# Patient Record
Sex: Female | Born: 1935 | Race: White | Hispanic: No | Marital: Married | Smoking: Former smoker
Health system: Southern US, Community
[De-identification: ages and names within clinical notes are randomized; demographics above are authoritative.]

## PROBLEM LIST (undated history)

## (undated) DIAGNOSIS — I10 Essential (primary) hypertension: Secondary | ICD-10-CM

## (undated) DIAGNOSIS — H9190 Unspecified hearing loss, unspecified ear: Secondary | ICD-10-CM

## (undated) DIAGNOSIS — M75101 Unspecified rotator cuff tear or rupture of right shoulder, not specified as traumatic: Secondary | ICD-10-CM

## (undated) DIAGNOSIS — S82899A Other fracture of unspecified lower leg, initial encounter for closed fracture: Secondary | ICD-10-CM

## (undated) DIAGNOSIS — T8859XA Other complications of anesthesia, initial encounter: Secondary | ICD-10-CM

## (undated) DIAGNOSIS — C50919 Malignant neoplasm of unspecified site of unspecified female breast: Secondary | ICD-10-CM

## (undated) DIAGNOSIS — B029 Zoster without complications: Secondary | ICD-10-CM

## (undated) DIAGNOSIS — D126 Benign neoplasm of colon, unspecified: Secondary | ICD-10-CM

## (undated) DIAGNOSIS — T4145XA Adverse effect of unspecified anesthetic, initial encounter: Secondary | ICD-10-CM

## (undated) DIAGNOSIS — R112 Nausea with vomiting, unspecified: Secondary | ICD-10-CM

## (undated) DIAGNOSIS — M1990 Unspecified osteoarthritis, unspecified site: Secondary | ICD-10-CM

## (undated) DIAGNOSIS — T148XXA Other injury of unspecified body region, initial encounter: Secondary | ICD-10-CM

## (undated) DIAGNOSIS — S62109A Fracture of unspecified carpal bone, unspecified wrist, initial encounter for closed fracture: Secondary | ICD-10-CM

## (undated) DIAGNOSIS — E785 Hyperlipidemia, unspecified: Secondary | ICD-10-CM

## (undated) DIAGNOSIS — Z9889 Other specified postprocedural states: Secondary | ICD-10-CM

## (undated) DIAGNOSIS — N3010 Interstitial cystitis (chronic) without hematuria: Secondary | ICD-10-CM

## (undated) HISTORY — PX: TONSILLECTOMY: SHX5217

## (undated) HISTORY — PX: WISDOM TOOTH EXTRACTION: SHX21

## (undated) HISTORY — DX: Fracture of unspecified carpal bone, unspecified wrist, initial encounter for closed fracture: S62.109A

## (undated) HISTORY — DX: Interstitial cystitis (chronic) without hematuria: N30.10

## (undated) HISTORY — DX: Hyperlipidemia, unspecified: E78.5

## (undated) HISTORY — DX: Malignant neoplasm of unspecified site of unspecified female breast: C50.919

## (undated) HISTORY — PX: DILATION AND CURETTAGE OF UTERUS: SHX78

## (undated) HISTORY — DX: Other fracture of unspecified lower leg, initial encounter for closed fracture: S82.899A

## (undated) HISTORY — DX: Benign neoplasm of colon, unspecified: D12.6

## (undated) HISTORY — PX: COLONOSCOPY: SHX174

## (undated) HISTORY — DX: Unspecified osteoarthritis, unspecified site: M19.90

## (undated) HISTORY — PX: MASTECTOMY: SHX3

## (undated) HISTORY — DX: Other injury of unspecified body region, initial encounter: T14.8XXA

## (undated) HISTORY — DX: Essential (primary) hypertension: I10

---

## 1973-08-03 HISTORY — PX: TUBAL LIGATION: SHX77

## 1976-08-03 DIAGNOSIS — C50919 Malignant neoplasm of unspecified site of unspecified female breast: Secondary | ICD-10-CM

## 1976-08-03 HISTORY — PX: MASTECTOMY, PARTIAL: SHX709

## 1976-08-03 HISTORY — DX: Malignant neoplasm of unspecified site of unspecified female breast: C50.919

## 1986-08-03 HISTORY — PX: ABDOMINAL HYSTERECTOMY: SHX81

## 2006-11-29 ENCOUNTER — Ambulatory Visit: Payer: Self-pay | Admitting: Gastroenterology

## 2006-11-30 ENCOUNTER — Ambulatory Visit: Payer: Self-pay | Admitting: Gastroenterology

## 2006-11-30 ENCOUNTER — Encounter: Payer: Self-pay | Admitting: Gastroenterology

## 2006-12-07 ENCOUNTER — Ambulatory Visit: Payer: Self-pay | Admitting: Internal Medicine

## 2006-12-07 LAB — CONVERTED CEMR LAB
AST: 24 units/L (ref 0–37)
Bilirubin, Direct: 0.1 mg/dL (ref 0.0–0.3)
CO2: 30 meq/L (ref 19–32)
Chloride: 105 meq/L (ref 96–112)
Cholesterol: 275 mg/dL (ref 0–200)
Direct LDL: 165 mg/dL
Glucose, Bld: 102 mg/dL — ABNORMAL HIGH (ref 70–99)
HDL: 35.5 mg/dL — ABNORMAL LOW (ref >39.0)
Sodium: 143 meq/L (ref 135–145)
Total Protein: 7.6 g/dL (ref 6.0–8.3)

## 2006-12-14 ENCOUNTER — Encounter: Payer: Self-pay | Admitting: Internal Medicine

## 2006-12-14 DIAGNOSIS — N3010 Interstitial cystitis (chronic) without hematuria: Secondary | ICD-10-CM

## 2006-12-14 DIAGNOSIS — S62109A Fracture of unspecified carpal bone, unspecified wrist, initial encounter for closed fracture: Secondary | ICD-10-CM | POA: Insufficient documentation

## 2006-12-14 DIAGNOSIS — S82899A Other fracture of unspecified lower leg, initial encounter for closed fracture: Secondary | ICD-10-CM | POA: Insufficient documentation

## 2006-12-14 DIAGNOSIS — M1990 Unspecified osteoarthritis, unspecified site: Secondary | ICD-10-CM | POA: Insufficient documentation

## 2006-12-14 DIAGNOSIS — Z853 Personal history of malignant neoplasm of breast: Secondary | ICD-10-CM | POA: Insufficient documentation

## 2006-12-14 DIAGNOSIS — Z9189 Other specified personal risk factors, not elsewhere classified: Secondary | ICD-10-CM | POA: Insufficient documentation

## 2006-12-14 DIAGNOSIS — R32 Unspecified urinary incontinence: Secondary | ICD-10-CM

## 2006-12-14 HISTORY — DX: Other fracture of unspecified lower leg, initial encounter for closed fracture: S82.899A

## 2006-12-14 HISTORY — DX: Unspecified urinary incontinence: R32

## 2006-12-14 HISTORY — DX: Fracture of unspecified carpal bone, unspecified wrist, initial encounter for closed fracture: S62.109A

## 2006-12-14 HISTORY — DX: Interstitial cystitis (chronic) without hematuria: N30.10

## 2006-12-14 HISTORY — DX: Personal history of malignant neoplasm of breast: Z85.3

## 2006-12-15 ENCOUNTER — Ambulatory Visit: Payer: Self-pay | Admitting: Internal Medicine

## 2007-02-02 ENCOUNTER — Ambulatory Visit: Payer: Self-pay | Admitting: Internal Medicine

## 2007-02-02 LAB — CONVERTED CEMR LAB
ALT: 25 U/L
AST: 27 U/L
Albumin: 4.2 g/dL
Alkaline Phosphatase: 69 U/L
BUN: 16 mg/dL
Bilirubin, Direct: 0.1 mg/dL
CO2: 29 meq/L
Calcium: 9.2 mg/dL
Chloride: 110 meq/L
Creatinine, Ser: 0.7 mg/dL
GFR calc Af Amer: 105 mL/min
GFR calc non Af Amer: 87 mL/min
Glucose, Bld: 108 mg/dL — ABNORMAL HIGH
Potassium: 4.1 meq/L
Sodium: 143 meq/L
Total Bilirubin: 1.1 mg/dL
Total Protein: 7.7 g/dL

## 2007-02-08 ENCOUNTER — Ambulatory Visit: Payer: Self-pay | Admitting: Internal Medicine

## 2007-02-13 DIAGNOSIS — I10 Essential (primary) hypertension: Secondary | ICD-10-CM

## 2007-02-13 DIAGNOSIS — E785 Hyperlipidemia, unspecified: Secondary | ICD-10-CM | POA: Insufficient documentation

## 2007-02-13 HISTORY — DX: Essential (primary) hypertension: I10

## 2007-03-29 ENCOUNTER — Ambulatory Visit: Payer: Self-pay | Admitting: Internal Medicine

## 2007-03-29 LAB — CONVERTED CEMR LAB
Cholesterol: 150 mg/dL (ref 0–200)
HDL: 30.8 mg/dL — ABNORMAL LOW (ref >39.0)
LDL Cholesterol: 97 mg/dL (ref 0–99)
Vit D, 1,25-Dihydroxy: 35 (ref 20–57)

## 2007-04-05 ENCOUNTER — Ambulatory Visit: Payer: Self-pay | Admitting: Internal Medicine

## 2007-04-05 DIAGNOSIS — R7309 Other abnormal glucose: Secondary | ICD-10-CM

## 2007-04-05 HISTORY — DX: Other abnormal glucose: R73.09

## 2007-04-05 LAB — CONVERTED CEMR LAB: Blood Glucose, Fingerstick: 128

## 2007-07-06 ENCOUNTER — Ambulatory Visit: Payer: Self-pay | Admitting: Internal Medicine

## 2007-07-06 LAB — CONVERTED CEMR LAB
Blood Glucose, Fingerstick: 124
Cholesterol, target level: 200 mg/dL
Vit D, 1,25-Dihydroxy: 53 (ref 30–89)

## 2007-11-14 ENCOUNTER — Ambulatory Visit: Payer: Self-pay | Admitting: Internal Medicine

## 2008-02-21 ENCOUNTER — Ambulatory Visit: Payer: Self-pay | Admitting: Internal Medicine

## 2008-02-21 LAB — CONVERTED CEMR LAB
ALT: 31 units/L (ref 0–35)
AST: 27 units/L (ref 0–37)
LDL Cholesterol: 101 mg/dL — ABNORMAL HIGH (ref 0–99)
Total Bilirubin: 1.1 mg/dL (ref 0.3–1.2)
Total CHOL/HDL Ratio: 3.9
Total Protein: 7.3 g/dL (ref 6.0–8.3)

## 2008-02-28 ENCOUNTER — Ambulatory Visit: Payer: Self-pay | Admitting: Internal Medicine

## 2008-02-28 DIAGNOSIS — R634 Abnormal weight loss: Secondary | ICD-10-CM

## 2008-03-01 ENCOUNTER — Telehealth: Payer: Self-pay | Admitting: *Deleted

## 2008-03-02 LAB — CONVERTED CEMR LAB
Basophils Absolute: 0 10*3/uL (ref 0.0–0.1)
Eosinophils Absolute: 0.1 10*3/uL (ref 0.0–0.7)
Free T4: 0.8 ng/dL (ref 0.6–1.6)
Hemoglobin: 13.9 g/dL (ref 12.0–15.0)
Lymphocytes Relative: 24.3 % (ref 12.0–46.0)
MCHC: 34.4 g/dL (ref 30.0–36.0)
Monocytes Absolute: 0.4 10*3/uL (ref 0.1–1.0)
RDW: 11.4 % — ABNORMAL LOW (ref 11.5–14.6)
Sed Rate: 18 mm/hr (ref 0–22)
WBC: 5.2 10*3/uL (ref 4.5–10.5)

## 2008-03-22 ENCOUNTER — Encounter: Admission: RE | Admit: 2008-03-22 | Discharge: 2008-03-22 | Payer: Self-pay | Admitting: Internal Medicine

## 2008-04-02 ENCOUNTER — Telehealth: Payer: Self-pay | Admitting: Internal Medicine

## 2008-05-10 ENCOUNTER — Ambulatory Visit: Payer: Self-pay | Admitting: Internal Medicine

## 2008-05-14 ENCOUNTER — Telehealth: Payer: Self-pay | Admitting: Internal Medicine

## 2008-07-31 ENCOUNTER — Telehealth: Payer: Self-pay | Admitting: Internal Medicine

## 2008-11-01 ENCOUNTER — Ambulatory Visit: Payer: Self-pay | Admitting: Internal Medicine

## 2008-12-18 ENCOUNTER — Ambulatory Visit: Payer: Self-pay | Admitting: Internal Medicine

## 2008-12-18 ENCOUNTER — Encounter: Payer: Self-pay | Admitting: Internal Medicine

## 2009-03-05 ENCOUNTER — Telehealth: Payer: Self-pay | Admitting: *Deleted

## 2009-04-03 ENCOUNTER — Ambulatory Visit: Payer: Self-pay | Admitting: Internal Medicine

## 2009-05-01 ENCOUNTER — Ambulatory Visit: Payer: Self-pay | Admitting: Internal Medicine

## 2009-07-01 ENCOUNTER — Ambulatory Visit: Payer: Self-pay | Admitting: Gastroenterology

## 2009-07-01 ENCOUNTER — Encounter (INDEPENDENT_AMBULATORY_CARE_PROVIDER_SITE_OTHER): Payer: Self-pay | Admitting: *Deleted

## 2009-07-01 DIAGNOSIS — R109 Unspecified abdominal pain: Secondary | ICD-10-CM | POA: Insufficient documentation

## 2009-07-01 DIAGNOSIS — Z860100 Personal history of colon polyps, unspecified: Secondary | ICD-10-CM

## 2009-07-01 DIAGNOSIS — K922 Gastrointestinal hemorrhage, unspecified: Secondary | ICD-10-CM

## 2009-07-01 DIAGNOSIS — K5900 Constipation, unspecified: Secondary | ICD-10-CM

## 2009-07-01 DIAGNOSIS — K921 Melena: Secondary | ICD-10-CM | POA: Insufficient documentation

## 2009-07-01 DIAGNOSIS — R159 Full incontinence of feces: Secondary | ICD-10-CM

## 2009-07-01 HISTORY — DX: Personal history of colon polyps, unspecified: Z86.0100

## 2009-07-01 HISTORY — DX: Gastrointestinal hemorrhage, unspecified: K92.2

## 2009-07-01 HISTORY — DX: Melena: K92.1

## 2009-07-01 HISTORY — DX: Constipation, unspecified: K59.00

## 2009-07-01 HISTORY — DX: Full incontinence of feces: R15.9

## 2009-07-10 ENCOUNTER — Telehealth: Payer: Self-pay | Admitting: Gastroenterology

## 2009-07-11 ENCOUNTER — Ambulatory Visit: Payer: Self-pay | Admitting: Gastroenterology

## 2009-07-15 ENCOUNTER — Encounter: Payer: Self-pay | Admitting: Gastroenterology

## 2009-08-12 ENCOUNTER — Ambulatory Visit: Payer: Self-pay | Admitting: Internal Medicine

## 2009-08-12 ENCOUNTER — Telehealth: Payer: Self-pay | Admitting: Internal Medicine

## 2009-10-01 ENCOUNTER — Ambulatory Visit: Payer: Self-pay | Admitting: Internal Medicine

## 2009-10-01 LAB — CONVERTED CEMR LAB
ALT: 27 units/L (ref 0–35)
AST: 28 units/L (ref 0–37)
BUN: 17 mg/dL (ref 6–23)
Basophils Relative: 0.2 % (ref 0.0–3.0)
Bilirubin, Direct: 0.2 mg/dL (ref 0.0–0.3)
CO2: 29 meq/L (ref 19–32)
Eosinophils Relative: 2 % (ref 0.0–5.0)
HCT: 40.6 % (ref 36.0–46.0)
Lymphs Abs: 1.3 10*3/uL (ref 0.7–4.0)
MCHC: 33.6 g/dL (ref 30.0–36.0)
MCV: 96.3 fL (ref 78.0–100.0)
Monocytes Absolute: 0.4 10*3/uL (ref 0.1–1.0)
Monocytes Relative: 9.5 % (ref 3.0–12.0)
Neutro Abs: 2.7 10*3/uL (ref 1.4–7.7)
Neutrophils Relative %: 59.4 % (ref 43.0–77.0)
Platelets: 132 10*3/uL — ABNORMAL LOW (ref 150.0–400.0)
Potassium: 4 meq/L (ref 3.5–5.1)
RBC: 4.22 M/uL (ref 3.87–5.11)
RDW: 11.7 % (ref 11.5–14.6)
Sodium: 141 meq/L (ref 135–145)
TSH: 1.47 microintl units/mL (ref 0.35–5.50)
Total Bilirubin: 0.7 mg/dL (ref 0.3–1.2)
Total Protein: 7.2 g/dL (ref 6.0–8.3)
WBC: 4.5 10*3/uL (ref 4.5–10.5)

## 2009-10-08 ENCOUNTER — Ambulatory Visit: Payer: Self-pay | Admitting: Internal Medicine

## 2010-02-28 ENCOUNTER — Encounter: Admission: RE | Admit: 2010-02-28 | Discharge: 2010-02-28 | Payer: Self-pay | Admitting: Internal Medicine

## 2010-04-15 ENCOUNTER — Ambulatory Visit: Payer: Self-pay | Admitting: Internal Medicine

## 2010-08-14 ENCOUNTER — Ambulatory Visit
Admission: RE | Admit: 2010-08-14 | Discharge: 2010-08-14 | Payer: Self-pay | Source: Home / Self Care | Attending: Internal Medicine | Admitting: Internal Medicine

## 2010-08-31 LAB — CONVERTED CEMR LAB
ALT: 32 units/L (ref 0–35)
AST: 27 units/L (ref 0–37)
Alkaline Phosphatase: 64 units/L (ref 39–117)
BUN: 21 mg/dL (ref 6–23)
Bilirubin, Direct: 0 mg/dL (ref 0.0–0.3)
Cholesterol: 148 mg/dL (ref 0–200)
Creatinine, Ser: 0.7 mg/dL (ref 0.4–1.2)
Hgb A1c MFr Bld: 5.9 % (ref 4.6–6.5)
TSH: 1.74 microintl units/mL (ref 0.35–5.50)
Total Protein: 7.7 g/dL (ref 6.0–8.3)

## 2010-09-02 NOTE — Assessment & Plan Note (Signed)
Summary: 6 MONTH ROV/NJR   Vital Signs:  Patient profile:   77 year old female Menstrual status:  hysterectomy Height:      63.5 inches Weight:      120 pounds BMI:     21.00 Pulse rate:   78 / minute BP sitting:   120 / 80  (right arm) Cuff size:   regular  Vitals Entered By: Romualdo Bolk, CMA (AAMA) (April 15, 2010 10:27 AM) CC: follow-up visit, Hypertension Management Nutritional Status Detail bg 2 hours after eating CBG Result 120   History of Present Illness: [name] [name] comes in today   for follow up of multiple medical problems  Since last visit  here  there have been no major changes in health status  but she has  lost weight from changing her diet and cutting out sugars   .  Anxiety  about     home situation. Still difficult with her husband   No cp sob /   NO change in exercise tolerance.  hard stools.  NO blood.  LIPDS: no se of meds noted  HT seems controlled    Hypertension History:      She complains of dyspnea with exertion, but denies headache, chest pain, palpitations, orthopnea, PND, peripheral edema, visual symptoms, neurologic problems, syncope, and side effects from treatment.  She notes no problems with any antihypertensive medication side effects.        Positive major cardiovascular risk factors include female age 64 years old or older, hyperlipidemia, and hypertension.  Negative major cardiovascular risk factors include non-tobacco-user status.     Preventive Screening-Counseling & Management  Alcohol-Tobacco     Alcohol drinks/day: 0     Smoking Status: quit     Year Quit: age 89  Caffeine-Diet-Exercise     Caffeine use/day: 3     Does Patient Exercise: yes     Type of exercise: Walk     Exercise (avg: min/session): <30     Times/week: 2  Current Medications (verified): 1)  Calcium 500 500 Mg Tabs (Calcium Carbonate) .... Take As Directed. 2)  Fish Oil 500 Mg Caps (Omega-3 Fatty Acids) .... Take 2 Capsule By Mouth Twice A  Day 3)  Oxybutynin Chloride 5 Mg Tabs (Oxybutynin Chloride) .Marland Kitchen.. 1 By Mouth Once Daily 4)  Norvasc 5 Mg  Tabs (Amlodipine Besylate) .Marland Kitchen.. 1 By Mouth Once Daily 5)  Simvastatin 20 Mg  Tabs (Simvastatin) .Marland Kitchen.. 1 By Mouth Once Daily 6)  Centrum Silver   Tabs (Multiple Vitamins-Minerals) .Marland Kitchen.. 1 By Mouth Once Daily 7)  Bl Vitamin C 500 Mg  Tabs (Ascorbic Acid) 8)  Vitamin D 400 Unit Tabs (Cholecalciferol) 9)  Magnesium 300 Mg Caps (Magnesium)  Allergies (verified): 1)  ! Macrobid 2)  ! * Furadantin 3)  Sulfamethoxazole (Sulfamethoxazole)  Past History:  Past medical, surgical, family and social histories (including risk factors) reviewed, and no changes noted (except as noted below).  Past Medical History: Reviewed history from 06/28/2009 and no changes required. Breast cancer, hx of  left 1978 Osteoarthritis Urinary incontinence Hyperlipidemia Hypertension G5P3 fx wrist r 1996 left ankle 1991  Interstitial cystitis Adenomatous Colon Polyps 11/2006  Past Surgical History: Reviewed history from 07/01/2009 and no changes required. Hysterectomy 1988 fibroids Mastectomy 1978 left Tubal ligation 1975   Tonsillectomy D & C X 2  Past History:  Care Management:  Orthopedics: Dr. Dub Mikes office Urology: Dr. Earlene Plater Gastroenterology: Russella Dar  Family History: Reviewed history from 07/01/2009 and no changes required.  mom suicide 36 yrs fa  MI  24 yrs    No FH of Colon Cancer:  Social History: Reviewed history from 07/01/2009 and no changes required. 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          urinary  frequency  Physical Exam  General:  alert, well-developed, well-nourished, and well-hydrated.   Head:  normocephalic and atraumatic.   Neck:  No deformities, masses, or tenderness noted. Lungs:  Normal respiratory effort, chest expands symmetrically. Lungs are clear to auscultation, no crackles or wheezes.no dullness.   Heart:  Normal rate and regular rhythm. S1 and S2 normal without gallop, murmur, click, rub or other extra sounds.no lifts.   Abdomen:  soft, non-tender, no hepatomegaly, and no splenomegaly.   Pulses:  pulses intact without delay   Extremities:  no clubbing cyanosis or edema  Neurologic:  alert & oriented X3, strength normal in all extremities, and gait normal.   Skin:  turgor normal, color normal, no ecchymoses, no petechiae, and no purpura.   Cervical Nodes:  No lymphadenopathy noted Psych:  Oriented X3, good eye contact, and not depressed appearing.  talkative    cognition seems normal   Impression & Recommendations:  Problem # 1:  FASTING HYPERGLYCEMIA (ICD-790.29) Assessment Improved  Labs Reviewed: Creat: 0.7 (10/01/2009)     Problem # 2:  HYPERTENSION (ICD-401.9)  Her updated medication list for this problem includes:    Norvasc 5 Mg Tabs (Amlodipine besylate) .Marland Kitchen... 1 by mouth once daily  BP today: 120/80 Prior BP: 120/70 (10/08/2009)  Prior 10 Yr Risk Heart Disease: 7 % (10/08/2009)  Labs Reviewed: K+: 4.0 (10/01/2009) Creat: : 0.7 (10/01/2009)   Chol: 154 (10/01/2009)   HDL: 58.40 (10/01/2009)   LDL: 81 (10/01/2009)   TG: 75.0 (10/01/2009)  Problem # 3:  HYPERLIPIDEMIA (ICD-272.4)  Her updated medication list for this problem includes:    Simvastatin 20 Mg Tabs (Simvastatin) .Marland Kitchen... 1 by mouth once daily  Labs Reviewed: SGOT: 28 (10/01/2009)   SGPT: 27 (10/01/2009)  Lipid Goals: Chol Goal: 200 (07/06/2007)   HDL Goal: 40 (07/06/2007)   LDL Goal: 130 (07/06/2007)   TG Goal: 150 (07/06/2007)  Prior 10 Yr Risk Heart Disease: 7 % (10/08/2009)    HDL:58.40 (10/01/2009), 47.50 (11/01/2008)  LDL:81 (10/01/2009), 84 (11/01/2008)  Chol:154 (10/01/2009), 148 (11/01/2008)  Trig:75.0 (10/01/2009), 82.0 (11/01/2008)  Problem # 4:  WEIGHT LOSS, RECENT (ICD-783.21)    7 #   seems from  dietary changes and not severe  disc diet  and nutritions  Problem # 5:  stress and anxiety  disc  about strategies   Complete Medication List: 1)  Calcium 500 500 Mg Tabs (Calcium carbonate) .... Take as directed. 2)  Fish Oil 500 Mg Caps (Omega-3 fatty acids) .... Take 2 capsule by mouth twice a day 3)  Oxybutynin Chloride 5 Mg Tabs (Oxybutynin chloride) .Marland Kitchen.. 1 by mouth once daily 4)  Norvasc 5 Mg Tabs (Amlodipine besylate) .Marland Kitchen.. 1 by mouth once daily 5)  Simvastatin 20 Mg Tabs (Simvastatin) .Marland Kitchen.. 1 by mouth once daily 6)  Centrum Silver Tabs (Multiple vitamins-minerals) .Marland Kitchen.. 1 by mouth once daily 7)  Bl Vitamin C 500 Mg Tabs (Ascorbic  acid) 8)  Vitamin D 400 Unit Tabs (Cholecalciferol) 9)  Magnesium 300 Mg Caps (Magnesium)  Other Orders: Admin 1st Vaccine (16109) Flu Vaccine 3yrs + (60454) Fingerstick (09811) Glucose, (CBG) (91478)  Hypertension Assessment/Plan:      The patient's hypertensive risk group is category B: At least one risk factor (excluding diabetes) with no target organ damage.  Her calculated 10 year risk of coronary heart disease is 7 %.  Today's blood pressure is 120/80.  Her blood pressure goal is < 140/90.  Patient Instructions: 1)  Avoid further weight  loss.  for now  but dont   add candy  just   fruits . 2)  Mild is ok.    Nuts  not salte is heart healthy.    3)  Fruit and  peanut butter.   4)  sleep hygiene  5)  Check up with cpx  labs in 6 months and hg a1c dx hyperglycemia.  Hyperlipidemia.     Flu Vaccine Consent Questions     Do you have a history of severe allergic reactions to this vaccine? no    Any prior history of allergic reactions to egg and/or gelatin? no    Do you have a sensitivity to the preservative  Thimersol? no    Do you have a past history of Guillan-Barre Syndrome? no    Do you currently have an acute febrile illness? no    Have you ever had a severe reaction to latex? no    Vaccine information given and explained to patient? yes    Are you currently pregnant? no    Lot Number:[id number]   Exp Date:01/31/2011   Site Given  Left Deltoid IMflu Romualdo Bolk, CMA (AAMA)  April 15, 2010 10:32 AM   greater than 50% of visit spent in counseling   25 minutes

## 2010-09-02 NOTE — Progress Notes (Signed)
Summary: [name] [name] product caused rash.  Phone Note Call from Patient   Caller: Patient Call For: Madelin Headings MD Summary of Call: Pt called [name] [name], and they are aware of the problem with the product she is using.  They will refund her money, so more than  likely that is where her rash originated. Initial call taken by: Lynann Beaver CMA,  August 12, 2009 3:11 PM

## 2010-09-02 NOTE — Assessment & Plan Note (Signed)
Summary: 6 MONTH ROV/NJR   Vital Signs:  Patient profile:   77 year old female Menstrual status:  hysterectomy Weight:      127 pounds Pulse rate:   78 / minute BP sitting:   120 / 70  (left arm) Cuff size:   regular  Vitals Entered By: Romualdo Bolk, CMA (AAMA) (October 08, 2009 10:51 AM) CC: Follow-up visit on labs, Hypertension Management   History of Present Illness: [name] [name] comesin today for   for follow up multiple medical problems  Since last visit  here  there have been no major changes in health status  she has avoided extra sugars and tries to stay active.   Hasnt had mammogram yet. LIPIDS: no se of meds   oon fishoil also  ELEvated BG  :  lifestyle intervention no change vision or neur  or infections BP doing well  VIt d taking otc   ok .  Skin is better on eucerin   Hypertension History:      She complains of dyspnea with exertion, but denies headache, chest pain, palpitations, orthopnea, PND, peripheral edema, visual symptoms, neurologic problems, syncope, and side effects from treatment.  She notes no problems with any antihypertensive medication side effects.        Positive major cardiovascular risk factors include female age 62 years old or older, hyperlipidemia, and hypertension.  Negative major cardiovascular risk factors include non-tobacco-user status.     Preventive Screening-Counseling & Management  Alcohol-Tobacco     Alcohol drinks/day: 0     Smoking Status: quit     Year Quit: age 35  Caffeine-Diet-Exercise     Caffeine use/day: 3     Does Patient Exercise: yes     Type of exercise: Walk     Exercise (avg: min/session): <30     Times/week: 2  Current Medications (verified): 1)  Calcium 500 500 Mg Tabs (Calcium Carbonate) .... Take As Directed. 2)  Fish Oil 500 Mg Caps (Omega-3 Fatty Acids) .... Take 2 Capsule By Mouth Twice A Day 3)  Oxybutynin Chloride 5 Mg Tabs (Oxybutynin Chloride) .Marland Kitchen.. 1 By Mouth Once Daily 4)  Norvasc 5 Mg   Tabs (Amlodipine Besylate) .Marland Kitchen.. 1 By Mouth Once Daily 5)  Simvastatin 20 Mg  Tabs (Simvastatin) .Marland Kitchen.. 1 By Mouth Once Daily 6)  Centrum Silver   Tabs (Multiple Vitamins-Minerals) .Marland Kitchen.. 1 By Mouth Once Daily 7)  Bl Vitamin C 500 Mg  Tabs (Ascorbic Acid) 8)  Vitamin D 400 Unit Tabs (Cholecalciferol)  Allergies (verified): 1)  ! Macrobid 2)  ! * Furadantin 3)  Sulfamethoxazole (Sulfamethoxazole)  Past History:  Past medical, surgical, family and social histories (including risk factors) reviewed, and no changes noted (except as noted below).  Past Medical History: Reviewed history from 06/28/2009 and no changes required. Breast cancer, hx of  left 1978 Osteoarthritis Urinary incontinence Hyperlipidemia Hypertension G5P3 fx wrist r 1996 left ankle 1991  Interstitial cystitis Adenomatous Colon Polyps 11/2006  Past Surgical History: Reviewed history from 07/01/2009 and no changes required. Hysterectomy 1988 fibroids Mastectomy 1978 left Tubal ligation 1975   Tonsillectomy D & C X 2  Past History:  Care Management:  Orthopedics: Dr. Dub Mikes office Urology: Dr. Earlene Plater Gastroenterology: Russella Dar  Family History: Reviewed history from 07/01/2009 and no changes required. mom suicide 36 yrs fa  MI  59 yrs    No FH of Colon Cancer:  Social History: Reviewed history from 07/01/2009 and no changes required. Retired Married Former [employer]  33  years ago  hho f 2   Illicit Drug Use - no Alcohol Use - yes-occasional  Review of Systems  The patient denies anorexia, fever, weight gain, vision loss, decreased hearing, hoarseness, chest pain, syncope, dyspnea on exertion, peripheral edema, prolonged cough, headaches, hemoptysis, abdominal pain, melena, hematochezia, severe indigestion/heartburn, hematuria, transient blindness, difficulty walking, depression, unusual weight change, abnormal bleeding, enlarged lymph nodes, and angioedema.         no falls   Physical Exam  General:   alert, well-developed, well-nourished, and well-hydrated.  mobile and normal gait  Head:  normocephalic and atraumatic.   Eyes:  vision grossly intact.   Ears:  R ear normal and L ear normal.   Nose:  no external deformity and no external erythema.   Neck:  No deformities, masses, or tenderness noted. Lungs:  Normal respiratory effort, chest expands symmetrically. Lungs are clear to auscultation, no crackles or wheezes.no dullness.   Heart:  Normal rate and regular rhythm. S1 and S2 normal without gallop, murmur, click, rub or other extra sounds.no lifts.   Abdomen:  Bowel sounds positive,abdomen soft and non-tender without masses, organomegaly or hernias noted. ? protuberant umbilicus but nontendern and not herniated  Pulses:  pulses intact without delay   Extremities:  no clubbing cyanosis or edema  Skin:  turgor normal, color normal, no ecchymoses, and no petechiae.   Cervical Nodes:  No lymphadenopathy noted Psych:  Oriented X3, good eye contact, not anxious appearing, and not depressed appearing.     Impression & Recommendations:  Problem # 1:  HYPERTENSION (ICD-401.9) Assessment Unchanged  Her updated medication list for this problem includes:    Norvasc 5 Mg Tabs (Amlodipine besylate) .Marland Kitchen... 1 by mouth once daily  BP today: 120/70 Prior BP: 120/80 (08/12/2009)  10 Yr Risk Heart Disease: 7 % Prior 10 Yr Risk Heart Disease: 8 % (04/03/2009)  Labs Reviewed: K+: 4.0 (10/01/2009) Creat: : 0.7 (10/01/2009)   Chol: 154 (10/01/2009)   HDL: 58.40 (10/01/2009)   LDL: 81 (10/01/2009)   TG: 75.0 (10/01/2009)  Problem # 2:  HYPERLIPIDEMIA (ICD-272.4) continue  Her updated medication list for this problem includes:    Simvastatin 20 Mg Tabs (Simvastatin) .Marland Kitchen... 1 by mouth once daily  Labs Reviewed: SGOT: 28 (10/01/2009)   SGPT: 27 (10/01/2009)  Lipid Goals: Chol Goal: 200 (07/06/2007)   HDL Goal: 40 (07/06/2007)   LDL Goal: 130 (07/06/2007)   TG Goal: 150 (07/06/2007)  10 Yr Risk  Heart Disease: 7 % Prior 10 Yr Risk Heart Disease: 8 % (04/03/2009)   HDL:58.40 (10/01/2009), 47.50 (11/01/2008)  LDL:81 (10/01/2009), 84 (11/01/2008)  Chol:154 (10/01/2009), 148 (11/01/2008)  Trig:75.0 (10/01/2009), 82.0 (11/01/2008)  Problem # 3:  FASTING HYPERGLYCEMIA (ICD-790.29) Assessment: Improved continue lifestyle intervention   Problem # 4:  BREAST CANCER, HX OF (ICD-V10.3) needs mammo   Complete Medication List: 1)  Calcium 500 500 Mg Tabs (Calcium carbonate) .... Take as directed. 2)  Fish Oil 500 Mg Caps (Omega-3 fatty acids) .... Take 2 capsule by mouth twice a day 3)  Oxybutynin Chloride 5 Mg Tabs (Oxybutynin chloride) .Marland Kitchen.. 1 by mouth once daily 4)  Norvasc 5 Mg Tabs (Amlodipine besylate) .Marland Kitchen.. 1 by mouth once daily 5)  Simvastatin 20 Mg Tabs (Simvastatin) .Marland Kitchen.. 1 by mouth once daily 6)  Centrum Silver Tabs (Multiple vitamins-minerals) .Marland Kitchen.. 1 by mouth once daily 7)  Bl Vitamin C 500 Mg Tabs (Ascorbic acid) 8)  Vitamin D 400 Unit Tabs (Cholecalciferol)  Hypertension Assessment/Plan:  The patient's hypertensive risk group is category B: At least one risk factor (excluding diabetes) with no target organ damage.  Her calculated 10 year risk of coronary heart disease is 7 %.  Today's blood pressure is 120/70.  Her blood pressure goal is < 140/90. she is doing very well  and active and good balance for her age . counseled    continuing   borderline low platelets  is cliniclaly insignificant .  Patient Instructions: 1)  get a mammogram.  2)  Continue   healthy eating   and stay active  3)  No change of meds.  4)  Please schedule a follow-up appointment in 6 months .

## 2010-09-02 NOTE — Assessment & Plan Note (Signed)
Summary: RED RAISED RAS ARMS/BENADRYL NO HELP/PS   Vital Signs:  Patient profile:   77 year old female Menstrual status:  hysterectomy Weight:      126 pounds Temp:     97.8 degrees F oral Pulse rate:   78 / minute BP sitting:   120 / 80  (left arm) Cuff size:   regular  Vitals Entered By: Romualdo Bolk, CMA (AAMA) (August 12, 2009 10:30 AM) CC: Rash on both arms x 1 week. Pt thinks it may be equate moisturizer that she has been using for years. Pt states that there is some on her legs. She also states that it does burn some and there is some itching. Pt has also been using benadryl.   History of Present Illness: [name] [name] comesin today  for acute visit  with days of rash  on arrms spec forearms.   Has been using cream she has used before.   on hands arms and a little on face  ( [employer] brand with Sunblock)  itchy and stingy  . stopped this for a few days  now just on dove soap. No new meds or systemic .     symptom otherwise.  No fever NVD and no new meds . No hc of same problem. Had been using this type of cream for a while.    Preventive Screening-Counseling & Management  Alcohol-Tobacco     Alcohol drinks/day: 0     Smoking Status: quit     Year Quit: age 3  Caffeine-Diet-Exercise     Caffeine use/day: 3     Does Patient Exercise: yes     Type of exercise: Walk     Exercise (avg: min/session): <30     Times/week: 2  Current Medications (verified): 1)  Calcium 500 500 Mg Tabs (Calcium Carbonate) .... Take As Directed. 2)  Fish Oil 500 Mg Caps (Omega-3 Fatty Acids) .... Take 2 Capsule By Mouth Twice A Day 3)  Oxybutynin Chloride 5 Mg Tabs (Oxybutynin Chloride) .Marland Kitchen.. 1 By Mouth Once Daily 4)  Norvasc 5 Mg  Tabs (Amlodipine Besylate) .Marland Kitchen.. 1 By Mouth Once Daily 5)  Simvastatin 20 Mg  Tabs (Simvastatin) .Marland Kitchen.. 1 By Mouth Once Daily 6)  Centrum Silver   Tabs (Multiple Vitamins-Minerals) .Marland Kitchen.. 1 By Mouth Once Daily 7)  Bl Vitamin C 500 Mg  Tabs (Ascorbic  Acid)  Allergies (verified): 1)  ! Macrobid 2)  ! * Furadantin 3)  Sulfamethoxazole (Sulfamethoxazole)  Past History:  Past medical, surgical, family and social histories (including risk factors) reviewed for relevance to current acute and chronic problems.  Past Medical History: Reviewed history from 06/28/2009 and no changes required. Breast cancer, hx of  left 1978 Osteoarthritis Urinary incontinence Hyperlipidemia Hypertension G5P3 fx wrist r 1996 left ankle 1991  Interstitial cystitis Adenomatous Colon Polyps 11/2006  Past Surgical History: Reviewed history from 07/01/2009 and no changes required. Hysterectomy 1988 fibroids Mastectomy 1978 left Tubal ligation 1975   Tonsillectomy D & C X 2  Family History: Reviewed history from 07/01/2009 and no changes required. mom suicide 36 yrs fa  MI  84 yrs    No FH of Colon Cancer:  Social History: Reviewed history from 07/01/2009 and no changes required. 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         had small rectal bleeding evaluated by Gi  .  Physical Exam  General:  Well-developed,well-nourished,in no acute distress; alert,appropriate and cooperative throughout examination Head:  normocephalic and atraumatic.   Eyes:  clear  Neck:  No deformities, masses, or tenderness noted. Lungs:  normal respiratory effort and no intercostal retractions.   Heart:  normal rate and regular rhythm.   Pulses:  nl cap refill Skin:  turgor normal, no ecchymoses, no petechiae, and no purpura.  forearms with patchy bumpy    distally  palm clear  .  few areas on thigh and  face erythematous without edema vesicles .  Cervical Nodes:  No lymphadenopathy noted Psych:  Oriented X3, good eye contact, not anxious  appearing, and not depressed appearing.     Impression & Recommendations:  Problem # 1:  ? of DERMATITIS, ALLERGIC (ICD-692.9)  not on trunk ? i f from the   sunscreenin the prep used .. doesnt look like   norvasc.  Her updated medication list for this problem includes:    Prednisone 20 Mg Tabs (Prednisone) .Marland Kitchen... Take 3 once daily for 3 days ,2 once daily for3 days, 1 once daily for 3 days, 1/2 once daily for 3 days or as directed  Orders: Prescription Created Electronically [id number])  Complete Medication List: 1)  Calcium 500 500 Mg Tabs (Calcium carbonate) .... Take as directed. 2)  Fish Oil 500 Mg Caps (Omega-3 fatty acids) .... Take 2 capsule by mouth twice a day 3)  Oxybutynin Chloride 5 Mg Tabs (Oxybutynin chloride) .Marland Kitchen.. 1 by mouth once daily 4)  Norvasc 5 Mg Tabs (Amlodipine besylate) .Marland Kitchen.. 1 by mouth once daily 5)  Simvastatin 20 Mg Tabs (Simvastatin) .Marland Kitchen.. 1 by mouth once daily 6)  Centrum Silver Tabs (Multiple vitamins-minerals) .Marland Kitchen.. 1 by mouth once daily 7)  Bl Vitamin C 500 Mg Tabs (Ascorbic acid) 8)  Prednisone 20 Mg Tabs (Prednisone) .... Take 3 once daily for 3 days ,2 once daily for3 days, 1 once daily for 3 days, 1/2 once daily for 3 days or as directed  Patient Instructions: 1)  this is possibly an allergic reaction to something topical.  2)  do not use  the moisturinzing cream for now .   3)  Dove soap is ok  4)  Can use eucerin or aquafor  5)  take prednisone treatment for poss allergic reaction.  6)  If not getting better may get dermatology to see.  Prescriptions: PREDNISONE 20 MG TABS (PREDNISONE) take 3 once daily for 3 days ,2 once daily for3 days, 1 once daily for 3 days, 1/2 once daily for 3 days or as directed  #30 x 0   Entered and Authorized by:   Madelin Headings MD   Signed by:   Madelin Headings MD on 08/12/2009   Method used:   Electronically to        [facility]  [phone]* (retail)       [address]       [place], [place]  [address]       Ph: [phone] or [phone]       Fax: [phone]   RxID:   [id number]

## 2010-09-04 NOTE — Assessment & Plan Note (Signed)
Summary: problems with blood pressure//alp   Vital Signs:  Patient profile:   77 year old female Menstrual status:  hysterectomy Weight:      131 pounds Pulse rate:   92 / minute Pulse rhythm:   regular BP sitting:   146 / 84  (left arm) Cuff size:   regular  Vitals Entered By: Kyung Rudd, CMA (August 14, 2010 1:20 PM) CC: pt c/o elevated BP   Primary Care Provider:  Berniece Andreas, MD  CC:  pt c/o elevated BP.  History of Present Illness: Patient presents to clinic as a workin for evaluation of elevated BP. H/o HTN with reviewed good past control. Recently seen at urology office and told elevated bp at 157/?. Home monitoring begun and log reviewed. Range 138-150/80-86 with predominant sbp 140's. Asx without ha and notes only intermittent transient dizziness lasting secs. BP repeated today by myself 148/82. Compliant with medication without adverse effect.  Current Medications (verified): 1)  Calcium 500 500 Mg Tabs (Calcium Carbonate) .... Take As Directed. 2)  Fish Oil 500 Mg Caps (Omega-3 Fatty Acids) .... Take 2 Capsule By Mouth Twice A Day 3)  Oxybutynin Chloride 5 Mg Tabs (Oxybutynin Chloride) .Marland Kitchen.. 1 By Mouth Once Daily 4)  Norvasc 5 Mg  Tabs (Amlodipine Besylate) .Marland Kitchen.. 1 By Mouth Once Daily 5)  Simvastatin 20 Mg  Tabs (Simvastatin) .Marland Kitchen.. 1 By Mouth Once Daily 6)  Centrum Silver   Tabs (Multiple Vitamins-Minerals) .Marland Kitchen.. 1 By Mouth Once Daily 7)  Bl Vitamin C 500 Mg  Tabs (Ascorbic Acid) 8)  Vitamin D 400 Unit Tabs (Cholecalciferol) 9)  Magnesium 300 Mg Caps (Magnesium)  Allergies (verified): 1)  ! Macrobid 2)  ! * Furadantin 3)  Sulfamethoxazole (Sulfamethoxazole)  Past History:  Past medical, surgical, family and social histories (including risk factors) reviewed, and no changes noted (except as noted below).  Past Medical History: Reviewed history from 06/28/2009 and no changes required. Breast cancer, hx of  left 1978 Osteoarthritis Urinary  incontinence Hyperlipidemia Hypertension G5P3 fx wrist r 1996 left ankle 1991  Interstitial cystitis Adenomatous Colon Polyps 11/2006  Past Surgical History: Reviewed history from 07/01/2009 and no changes required. Hysterectomy 1988 fibroids Mastectomy 1978 left Tubal ligation 1975   Tonsillectomy D & C X 2  Family History: Reviewed history from 07/01/2009 and no changes required. mom suicide 36 yrs fa  MI  4 yrs    No FH of Colon Cancer:  Social History: Reviewed history from 04/15/2010 and no changes required. 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 Eyes:  Denies blurring and double vision. CV:  Complains of lightheadness; denies chest pain or discomfort, fainting, and near fainting. Resp:  Denies chest discomfort and shortness of breath. Neuro:  Denies disturbances in coordination, headaches, visual disturbances, and weakness.  Physical Exam  General:  Well-developed,well-nourished,in no acute distress; alert,appropriate and cooperative throughout examination Head:  Normocephalic and atraumatic without obvious abnormalities. No apparent alopecia or balding. Eyes:  vision grossly intact, pupils equal, and pupils round.   Neurologic:  alert & oriented X3 and gait normal.     Impression & Recommendations:  Problem # 1:  HYPERTENSION (ICD-401.9) Assessment Deteriorated Mild loss of control on repeated measurements. Reviewed low sodium diet and regular aerobic exercise as potential helps. Increase norvasc 10mg  by mouth once daily. Monitor bp as  outpt and f/u in clinic as schedule or sooner if needed.  Her updated medication list for this problem includes:    Norvasc 10 Mg Tabs (Amlodipine besylate) ..... One by mouth qd  Complete Medication List: 1)  Calcium 500 500 Mg Tabs (Calcium carbonate) .... Take as  directed. 2)  Fish Oil 500 Mg Caps (Omega-3 fatty acids) .... Take 2 capsule by mouth twice a day 3)  Oxybutynin Chloride 5 Mg Tabs (Oxybutynin chloride) .Marland Kitchen.. 1 by mouth once daily 4)  Norvasc 10 Mg Tabs (Amlodipine besylate) .... One by mouth qd 5)  Simvastatin 20 Mg Tabs (Simvastatin) .Marland Kitchen.. 1 by mouth once daily 6)  Centrum Silver Tabs (Multiple vitamins-minerals) .Marland Kitchen.. 1 by mouth once daily 7)  Bl Vitamin C 500 Mg Tabs (Ascorbic acid) 8)  Vitamin D 400 Unit Tabs (Cholecalciferol) 9)  Magnesium 300 Mg Caps (Magnesium) Prescriptions: NORVASC 10 MG TABS (AMLODIPINE BESYLATE) one by mouth qd  #30 x 6   Entered and Authorized by:   Edwyna Perfect MD   Signed by:   Edwyna Perfect MD on 08/14/2010   Method used:   Print then Give to Patient   RxID:   [id number]    Orders Added: 1)  Est. Patient Level III [01027]

## 2010-10-07 ENCOUNTER — Other Ambulatory Visit (INDEPENDENT_AMBULATORY_CARE_PROVIDER_SITE_OTHER): Payer: Medicare Other | Admitting: Internal Medicine

## 2010-10-07 DIAGNOSIS — I10 Essential (primary) hypertension: Secondary | ICD-10-CM

## 2010-10-07 DIAGNOSIS — Z79899 Other long term (current) drug therapy: Secondary | ICD-10-CM

## 2010-10-07 DIAGNOSIS — Z0000 Encounter for general adult medical examination without abnormal findings: Secondary | ICD-10-CM

## 2010-10-07 LAB — CBC WITH DIFFERENTIAL/PLATELET
Eosinophils Absolute: 0.1 10*3/uL (ref 0.0–0.7)
Eosinophils Relative: 1.6 % (ref 0.0–5.0)
Lymphs Abs: 1.4 10*3/uL (ref 0.7–4.0)
MCV: 94.3 fl (ref 78.0–100.0)
Monocytes Relative: 7.4 % (ref 3.0–12.0)
Neutro Abs: 3.4 10*3/uL (ref 1.4–7.7)
Platelets: 146 10*3/uL — ABNORMAL LOW (ref 150.0–400.0)
RDW: 12.3 % (ref 11.5–14.6)

## 2010-10-07 LAB — POCT URINALYSIS DIPSTICK
Blood, UA: NEGATIVE
Glucose, UA: NEGATIVE
Ketones, UA: NEGATIVE
Nitrite, UA: NEGATIVE
Protein, UA: NEGATIVE
Spec Grav, UA: 1.02
Urobilinogen, UA: 0.2

## 2010-10-07 LAB — LIPID PANEL
Cholesterol: 163 mg/dL (ref 0–200)
HDL: 55.9 mg/dL (ref >39.00)
LDL Cholesterol: 96 mg/dL (ref 0–99)
Total CHOL/HDL Ratio: 3
Triglycerides: 54 mg/dL (ref 0.0–149.0)
VLDL: 10.8 mg/dL (ref 0.0–40.0)

## 2010-10-07 LAB — TSH: TSH: 1.63 u[IU]/mL (ref 0.35–5.50)

## 2010-10-07 LAB — BASIC METABOLIC PANEL: BUN: 23 mg/dL (ref 6–23)

## 2010-10-07 LAB — HEPATIC FUNCTION PANEL
AST: 26 U/L (ref 0–37)
Alkaline Phosphatase: 61 U/L (ref 39–117)

## 2010-10-09 ENCOUNTER — Encounter: Payer: Self-pay | Admitting: Internal Medicine

## 2010-10-14 ENCOUNTER — Ambulatory Visit (INDEPENDENT_AMBULATORY_CARE_PROVIDER_SITE_OTHER): Payer: Medicare Other | Admitting: Internal Medicine

## 2010-10-14 ENCOUNTER — Encounter: Payer: Self-pay | Admitting: Internal Medicine

## 2010-10-14 VITALS — BP 120/80 | HR 66 | Ht 63.5 in | Wt 127.0 lb

## 2010-10-14 DIAGNOSIS — I10 Essential (primary) hypertension: Secondary | ICD-10-CM

## 2010-10-14 DIAGNOSIS — Z0000 Encounter for general adult medical examination without abnormal findings: Secondary | ICD-10-CM

## 2010-10-14 DIAGNOSIS — Z853 Personal history of malignant neoplasm of breast: Secondary | ICD-10-CM

## 2010-10-14 DIAGNOSIS — N3010 Interstitial cystitis (chronic) without hematuria: Secondary | ICD-10-CM

## 2010-10-14 DIAGNOSIS — R7309 Other abnormal glucose: Secondary | ICD-10-CM

## 2010-10-14 DIAGNOSIS — E785 Hyperlipidemia, unspecified: Secondary | ICD-10-CM

## 2010-10-14 NOTE — Patient Instructions (Signed)
Stay on same meds  Continue healthy exercise and eating return office visit in 6 months or as needed Wt Readings from Last 3 Encounters:  10/14/10 127 lb (57.607 kg)  08/14/10 131 lb (59.421 kg)  04/15/10 120 lb (54.432 kg)

## 2010-10-14 NOTE — Progress Notes (Signed)
Subjective:    [name] [name] is a 77 y.o. female who presents for a medicare wellness and  multiple medical issues  exam.   No major change in health status since last visit . HT  Seems controlled   115   Range and no se noted of meds .  Breast ca  Go mammogram finally  And was ok.  IC  Ongoing  No change   Saw uro in past   Cardiac risk factors: advanced age (older than 54 for men, 41 for women), dyslipidemia and hypertension.  Activities of Daily Living  In your present state of health, do you have any difficulty performing the following activities?:  Preparing food and eating?: No Bathing yourself: No Getting dressed: No Using the toilet:No Moving around from place to place: No In the past year have you fallen or had a near fall?:No  Current exercise habits: Home exercise routine includes walking 1 mile hrs per day.   Dietary issues discussed: avoiding sugars for the elevated bg and has lost some weight but stable doubled up on vit d otc. Hearing:  Ok   Minimal decrease Vision:   Wears Doctor, general practice: No falls .  Has smoke detector and wears seat belts.  No firearms. No excess sun exposure. Sees dentist regularly  Advance directive :  Reviewed  Memory:  Good  No concerns   Depression Screen (Note: if answer to either of the following is "Yes", then a more complete depression screening is indicated)  Q1: Over the past two weeks, have you felt down, depressed or hopeless?no Q2: Over the past two weeks, have you felt little interest or pleasure in doing things? no  stress at home  From husband as discussed in past The following portions of the patient's history were reviewed and updated as appropriate: allergies, current medications, past family history, past medical history, past social history, past surgical history and problem list. Review of Systems Pertinent items are noted in HPI.   Neg cp sob gi problems   Gu still with UI and IC  And meds donet help that much   copiugn contiues to exercise without sig porblems   DJD no change    Objective:     Vision by Snellen chart: right eye:per eye doc, left eye:per eye doc Last eye exam: 04/03/2010-normal done by [facility] Blood pressure 120/80, pulse 66, height 5' 3.5" (1.613 m), weight 127 lb (57.607 kg). Body mass index is 22.14 kg/(m^2). Physical Exam: Vital signs reviewed ZOX:WRUE is a well-developed well-nourished alert cooperative  white female who appears younger than her stated age in no acute distress.  HEENT: normocephalic  traumatic , Eyes: PERRL EOM's full, conjunctiva clear, glasses Nares: paten,t no deformity discharge or tenderness., Ears: no deformity EAC's clear TMs with normal landmarks. Mouth: clear OP, no lesions, edema.  Moist mucous membranes. Dentition in adequate repair. NECK: supple without masses, thyromegaly or bruits. CHEST/PULM:  Clear to auscultation and percussion breath sounds equal no wheeze , rales or rhonchi. No chest wall deformities or tenderness. Breast:  Right normal by inspection . No dimpling, discharge, masses, tenderness or discharge .  Of right  Left is absent   CV: PMI is nondisplaced, S1 S2 no gallops, murmurs, rubs. Peripheral pulses are full without delay.No JVD .   bp 138/72 repeat  ABDOMEN: Bowel sounds normal nontender  No guard or rebound, no hepato splenomegal no CVA tenderness.  No hernia. Extremtities:  No clubbing cyanosis or edema, no acute joint  swelling or redness no focal atrophy NEURO:  Oriented x3, cranial nerves 3-12 appear to be intact, no obvious focal weakness,gait within normal limits no abnormal reflexes or asymmetrical SKIN: No acute rashes normal turgor, color, no bruising or petechiae. PSYCH: Oriented, good eye contact, no obvious depression anxiety, cognition and judgment appear normal. LN: no cervical axillary inguinal adenopathy  Oriented x 3 and no noted deficits in memory, attention, and speech.     Assessment:    Wellness    Reviewed info  Will wait on the zostavax.  Reviewed nutrition and BMI HT  controlled  Breast Cancer hx  No recurrence  IC  Ongoing sx meds not that helpful Hyperglycemia good.  With diet intervention  labs reviewed      Lab Results  Component Value Date   CREATININE 0.7 10/07/2010    Plan:     During the course of the visit the patient was educated and counseled about appropriate screening and preventive services including:   Nutrition counseling   zostavax   Continue lifestyle intervention healthy eating and exercise .     Monitor bp readings  Continue meds   Patient Instructions (the written plan) was given to the patient.

## 2010-10-19 ENCOUNTER — Encounter: Payer: Self-pay | Admitting: Internal Medicine

## 2010-11-26 ENCOUNTER — Other Ambulatory Visit: Payer: Self-pay | Admitting: Internal Medicine

## 2010-12-05 ENCOUNTER — Ambulatory Visit (INDEPENDENT_AMBULATORY_CARE_PROVIDER_SITE_OTHER): Payer: Medicare Other | Admitting: Internal Medicine

## 2010-12-05 ENCOUNTER — Encounter: Payer: Self-pay | Admitting: Internal Medicine

## 2010-12-05 VITALS — BP 120/60 | HR 82 | Temp 99.4°F | Wt 125.0 lb

## 2010-12-05 DIAGNOSIS — J040 Acute laryngitis: Secondary | ICD-10-CM

## 2010-12-05 DIAGNOSIS — J069 Acute upper respiratory infection, unspecified: Secondary | ICD-10-CM

## 2010-12-05 MED ORDER — HYDROCODONE-HOMATROPINE 5-1.5 MG/5ML PO SYRP: 5.0000 mL | ORAL_SOLUTION | ORAL | Status: AC | PRN

## 2010-12-05 NOTE — Patient Instructions (Addendum)
I think this is an acute  viral respiratory illness  .  Your lung exam is normal. For there laryngitis  Warm liquids    And fluids.  Relative voice rest. Call if high fever shortness of breath .  Or lasting 2 weeks or more.  Can use cough med at night if needed for comfort.

## 2010-12-05 NOTE — Progress Notes (Signed)
  Subjective:    Patient ID: [name] [name], female    DOB: 1932/04/25, 77 y.o.   MRN: [id number]  HPI Comes in today for acute visit because of sore throat laryngitis for 3 days without fever but had sweats last pm no chills. Minimal to no cough and no runny nose.   No   NVD .     No cp sob   No hx f lung disease ofr asthma . No meds taken.  wsa supposed to go to a wedding In [place] but cancelling cause of illness and travel.   Review of Systems No rash   See above no hemoptysis   Non smoker.  has cough stress incontinence.  worse with illness    Rest as per HPI Objective:   Physical Exam WDWN in nad  Has hoarseness    But no stridor .   HEENT: Normocephalic ;atraumatic , Eyes;  PERRL, EOMs  Full, lids and conjunctiva clear,,Ears: no deformities, canals nl, TM landmarks normal, Nose: no deformity or discharge  Mouth : OP clear without lesion or edema .  Very red op no lesions   Face non tender  Neck : tender ac nodes not enlarged Chest:  Clear to A&P without wheezes rales or rhonchi CV:  S1-S2 no gallops or murmurs peripheral perfusion is normal No clubbing cyanosis or edema    Nl cap refill       Assessment & Plan:  Acute  Respiratory infection   Laryngitis   Most likely viral uncomplicated but at risk cause of age.    Expectant management. Sx  Relief and fluids and voice rest.    Cough med if needed Counseled. About  Alarm features  And reason for return.

## 2010-12-16 NOTE — Assessment & Plan Note (Signed)
[facility] OFFICE NOTE   NAME:[name], [name] PINARD                     MRN:          [id number]  DATE:12/07/2006                            DOB:          12/04/1931    NEW PATIENT VISIT:   CHIEF COMPLAINT:  New patient to get established; high blood pressure.   HISTORY OF PRESENT ILLNESS:  [name] [name] is a 77 year old ex-smoking,  married white female referred by Dr. Russella Dar, who did her colonoscopy a  week or so ago, for elevated blood pressure reading.  She apparently  does not have a primary care physician, although did see Dr. Darien Ramus 9  or 10 years ago and has had no primary care because of some anxiety of  coming to the physicians.  She has no diagnosed hypertension, but may  have had elevated blood pressure when she was seen in urgent care in the  past, but no one diagnosed her with hypertension.  She denies any chest  pain, shortness of breath, significant edema or major exercise  intolerance or change in the last few years.  Family history showed some  elevated blood pressure in father, but he was elderly, in his 55s.   PAST MEDICAL HISTORY:  See data base.  1. Mastectomy, left, in 1980.  2. Breast cancer, no treatment other than mastectomy.  3. Tubal ligation, 1975.  4. Hysterectomy, 1988, for fibroids.  5. Tonsillectomy as a child.  6. Colonoscopy in 2008 showed a polyp, pathology pending.  7. Fracture of left ankle, 1991.  8. Broken wrist in 1996.  9. History of urinary incontinence with interstitial cystitis, on      Ditropan without significant improvement.  10.Chickenpox as a child.  11.Osteoarthritis.   Last tetanus shot was in 1996, Pneumovax in 2000.   She is gravida 5, para 3.  Last mammogram was in 1999.   MEDICATIONS:  1. Oxybutynin per Dr. Earlene Plater, Urology.  2. Calcium and vitamin D, Caltrate over the counter.  3. Omega-3 b.i.d.   DRUG ALLERGIES:  FURADANTIN caused hives; ? SULFA  DRUGS.   SOCIAL HISTORY:  She is married, rare alcohol, quit tobacco at age 74,  was a light smoker, some caffeine, 6 hours of sleep, household of 2.   FAMILY HISTORY:  See data base.  Mother died of suicide at the age of  17.  No known cancer.  Father died in his 73s; he had a heart attack at  45, had hypertension and what sounds like some GI bleeding.  Suicide  in a half aunt on father's side.  Maternal grandmother died at age 51 of  a clot to the pancreas.  There was some heart disease in her  grandparents' generation.   REVIEW OF SYSTEMS:  Negative for chest pain, shortness of breath.  Major  orthopedic issues today.  No leg edema.  No skin changes or hair  changes.  The rest of ROS noncontributory.   OBJECTIVE:  Height 5 feet 4 inches, weight 149.  Pulse 78 and regular;  blood pressure 170/98 by nurse; by physician 165/88, right arm,  164/90  on the left arm.  This is a well-developed, well-nourished healthy-appearing elderly lady  in no acute distress, who is quite talkative and appears to have no  problems getting up on the exam table.  HEENT:  Grossly normal.  NECK:  Supple without bruits, adenopathy or masses.  CHEST:  CTA.  BS equal.  CARDIAC:  S1 and S2.  No gallops or murmurs.  Peripheral pulses present  without delay.  I heard a faint abdominal bruit, systolic only, in the  mid abdomen.  I feel no masses.  There are no femoral or carotid bruits.  BREASTS:  Left absent.  ABDOMEN:  Without organomegaly, guarding or rebound.  NEUROLOGIC:  Intact and grossly nonfocal.  SKIN:  No acute changes and she is anicteric.   IMPRESSION:  1. Hypertension:  This is probably longer-standing with some white      coat aggravation on top of it with no obvious end-organ damage      today.  I did discuss with her importance of treatment, to which      she agrees.  We will give lisinopril/hydrochlorothiazide 20/12.5      one p.o. daily and return office visit in 1 month.  We will  check      laboratories today, which will include her chemistry panel and      nonfasting lipids today.  2. Lack of primary care over a number of years for whatever reason.      We will need to update her immunizations.  She needs a mammogram,      which she will get.  3. Interstitial cystitis.  She will follow up with urologist as      appropriate.  She feels that the current medication is not really      quite helpful.  She will also bring back screening tests that were      done with [facility] which showed her at risk for osteoporosis, but      apparently had normal artery flow.     Neta Mends. [name], MD  Electronically Signed    WKP/MedQ  DD: 12/07/2006  DT: 12/08/2006  Job #: [id number]

## 2010-12-17 ENCOUNTER — Encounter: Payer: Self-pay | Admitting: Internal Medicine

## 2010-12-17 ENCOUNTER — Ambulatory Visit (INDEPENDENT_AMBULATORY_CARE_PROVIDER_SITE_OTHER): Payer: Medicare Other | Admitting: Internal Medicine

## 2010-12-17 VITALS — BP 130/80 | HR 82 | Temp 99.3°F | Wt 125.0 lb

## 2010-12-17 DIAGNOSIS — R05 Cough: Secondary | ICD-10-CM

## 2010-12-17 DIAGNOSIS — J029 Acute pharyngitis, unspecified: Secondary | ICD-10-CM | POA: Insufficient documentation

## 2010-12-17 DIAGNOSIS — I10 Essential (primary) hypertension: Secondary | ICD-10-CM

## 2010-12-17 DIAGNOSIS — J329 Chronic sinusitis, unspecified: Secondary | ICD-10-CM

## 2010-12-17 MED ORDER — AMOXICILLIN 500 MG PO CAPS: 500.0000 mg | ORAL_CAPSULE | Freq: Three times a day (TID) | ORAL | Status: AC

## 2010-12-17 NOTE — Progress Notes (Signed)
  Subjective:    Patient ID: [name] [name], female    DOB: [birth year], 77 y.o.   MRN: [id number]  HPI Patient has continuing symptoms from her last visit and comes in today for an acute evaluation. Since her last visit her cough continues but is not severe or deep it is a dry cough and has no associated wheezing or shortness of breath or change in exercise tolerance. However she has a severe sore throat and her glands are tender. It hurts to swallow may have some drainage stuffy nose but not a lot of nasal drainage. No fever no strep exposure but does take care of grandchildren.  No fever or sweats. No falling. HT  No change nad has been good readings.   No change .   Review of Systems No unusual rashes fevers hemoptysis change in hearing or vision use the cough medicine  No reflux and no one else got sick.  Past history family history social history reviewed in the electronic medical record.     Objective:   Physical Exam Well-developed well-nourished well-groomed in no acute distress. No coughing during the exam. Non toxic and well appearing.  HEENT: Normocephalic ;atraumatic , Eyes;  PERRL, EOMs  Full, lids and conjunctiva clear,,Ears: no deformities, canals nl, TM landmarks normal, Nose: no deformity or discharge   Some congestion  No face tenderness Mouth : OP clear without lesion or edema . The area red OP with cobblestoning drainage tracts..   Neck supple without mass tender ac nodes  Chest:  Clear to A&P without wheezes rales or rhonchi CV:  S1-S2 no gallops or murmurs peripheral perfusion is normal Neuro non focal      Assessment & Plan:  Pharyngitis very red on exam with adenopathy   being worse possibly from postnasal drainage although not significant on exam. Cough actually improved some   Prolonged respiratory infection 2 weeks and throat is getting worse. We'll treat for sinusitis upper respiratory complicated infection. Expectant management and symptomatic treatment  she will call if not improving after the weekend.

## 2010-12-17 NOTE — Patient Instructions (Signed)
take the antibiotic  Expect improvement in the next 3-5 days  .

## 2010-12-19 NOTE — Assessment & Plan Note (Signed)
[facility]                         GASTROENTEROLOGY OFFICE NOTE   NAME:[name] [name], [name] [name]                     MRN:          [id number]  DATE:11/29/2006                            DOB:          1932-02-19    CHIEF COMPLAINT:  A 77 year old white female with hematochezia.   HISTORY OF PRESENT ILLNESS:  [name] has had mild constipation over  the years, and these symptoms have not changed. She drinks prune juice  frequently which seems to alleviate her symptoms. She suspects that her  constipation is caused by oxybutynin used to treat interstitial  cystitis. She has noticed several occasions of small amounts of bright  red blood with bowel movements over the past several weeks. She notes no  change in stool caliber, abdominal pain or weight loss. She has had rare  episodes of heartburn and indigestion occurring every several months,  and these symptoms have not changed. There is no family history colon  cancer, colon polyps or inflammatory bowel disease. She has not  previously had colonoscopy. She does not have a primary care physician  but had planned to use Dr. Doran Clay in the past as a primary care  doctor.   PAST MEDICAL HISTORY:  1. Left breast cancer status post left mastectomy in 1978.  2. Status post hysterectomy in 1988.  3. Interstitial cystitis.   CURRENT MEDICATIONS:  Listed on the chart, updated and reviewed.   MEDICATION ALLERGIES:  FURADANTIN LEADING TO HIVES.   SOCIAL HISTORY AND REVIEW OF SYSTEMS:  Per the handwritten form.   PHYSICAL EXAMINATION:  Well-developed, well-nourished, white female in  no acute distress. Height 5 feet 3 inches. Weight 153.2 pounds. Blood  pressure is 176/100. Blood pressure 68 and regular.  HEENT EXAM:  Anicteric sclerae. Oropharynx clear.  CHEST:  Clear to auscultation bilaterally.  CARDIAC:  Regular rate and rhythm without murmurs appreciated.  ABDOMEN:  Soft, nontender, nondistended.  Normal active bowel sounds. No  palpable organomegaly, masses, or hernias.  RECTAL:  Deferred at the time of colonoscopy.  EXTREMITIES:  Without clubbing, cyanosis, or edema.  NEUROLOGICAL:  Alert and oriented x3. Grossly nonfocal.   ASSESSMENT AND PLAN:  1. Small volume hematochezia, personal history of breast cancer and      mild, controlled constipation possibly secondary to oxybutynin.      Rule out colorectal neoplasms, hemorrhoids and other disorders.      Risks, benefits and alternatives to colonoscopy with possible      biopsy, possibly polypectomy and possible destruction of internal      hemorrhoids discussed with the patient, and she consents to      proceed. This will be scheduled electively.  2. Elevated blood pressure and health maintenance. She is advised to      follow up with Dr. Janey Greaser for a complete physical exam and further      assessment of her elevated blood pressure.     Venita Lick. Russella Dar, MD, [facility]  Electronically Signed    MTS/MedQ  DD: 11/29/2006  DT: 11/29/2006  Job #: [id number]

## 2010-12-23 ENCOUNTER — Telehealth: Payer: Self-pay | Admitting: *Deleted

## 2010-12-23 NOTE — Telephone Encounter (Signed)
Pt called stating that the sore throat is gone, she hurts down the front of her diaphragm but she thinks it's due to the cough. Otherwise, she is doing good.

## 2010-12-24 NOTE — Telephone Encounter (Signed)
Pt called today saying that she only has the cough at night but now she has developed a constant burning sensation from her neck down on both front and back. Her sore throat is gone.

## 2010-12-25 NOTE — Telephone Encounter (Signed)
Unsure.   why she has this but can try prilosec 20 per day for up to 2 weeks  incase reflux if causing this .

## 2010-12-30 NOTE — Telephone Encounter (Signed)
Pt aware of this. 

## 2011-02-16 ENCOUNTER — Ambulatory Visit (INDEPENDENT_AMBULATORY_CARE_PROVIDER_SITE_OTHER)
Admission: RE | Admit: 2011-02-16 | Discharge: 2011-02-16 | Disposition: A | Discharge status: Home / Self Care | Payer: Medicare Other | Source: Ambulatory Visit | Attending: Internal Medicine | Admitting: Internal Medicine

## 2011-02-16 ENCOUNTER — Encounter: Payer: Self-pay | Admitting: Internal Medicine

## 2011-02-16 ENCOUNTER — Ambulatory Visit (INDEPENDENT_AMBULATORY_CARE_PROVIDER_SITE_OTHER): Payer: Medicare Other | Admitting: Internal Medicine

## 2011-02-16 VITALS — BP 110/60 | HR 93 | Temp 99.4°F | Wt 124.0 lb

## 2011-02-16 DIAGNOSIS — Z853 Personal history of malignant neoplasm of breast: Secondary | ICD-10-CM

## 2011-02-16 DIAGNOSIS — Z87891 Personal history of nicotine dependence: Secondary | ICD-10-CM

## 2011-02-16 DIAGNOSIS — R05 Cough: Secondary | ICD-10-CM

## 2011-02-16 DIAGNOSIS — R059 Cough, unspecified: Secondary | ICD-10-CM

## 2011-02-16 DIAGNOSIS — R32 Unspecified urinary incontinence: Secondary | ICD-10-CM

## 2011-02-16 NOTE — Patient Instructions (Addendum)
Get chest x ray  And will let you know results.  This could   Be an allergy   Type reaction. Take claritin .Marland Kitchen  Zyrtec.  Or benadryl  I f  Not helping   We may add prednisone    To see if helps  The cough

## 2011-02-16 NOTE — Progress Notes (Signed)
  Subjective:    Patient ID: [name] [name], female    DOB: [birth year], 77 y.o.   MRN: [id number]  HPI Comes in today for   Persistence and progression of cough. Since may had improved and then a lot worse recently .  CO of night time cough that is now becoming productive  Without fever ,sob  Some clear phelgm  coughing night   And causing incontinence.   Unsure why she is not getting better   Review of Systems Runny nose   Non productive cough      Hx of hives on legs and rx with benadryl No fever weight loss sweats  .   Pain  Rest as per hpi  Past history family history social history reviewed in the electronic medical record.     Objective:   Physical Exam WDWN in nad midly hoarse  HEENT: Normocephalic ;atraumatic , Eyes;  PERRL, EOMs  Full, lids and conjunctiva clear,,Ears: no deformities, canals nl, TM landmarks normal, Nose: no deformity clear  Discharge? Face non tender  Mouth : OP clear without lesion or edema . Neck: no masses or adenopathy Chest:  Clear to A&P without wheezes rales or rhonchi ? prolonged exp phase  On forced expirations CV:  S1-S2 no gallops or murmurs peripheral perfusion is normal No clubbing cyanosis or edema  Neuro grossly intact  Non focal       Assessment & Plan:  Cough long standing that seemed originally from rti and improved and now worse.  Either intervening process or post infectious irritability or poss allergic component.   Remote hx of tobacco and hx of breast cancer   Get x ray and  Plan antiinflammatory therapy   And fu depending on results and progress

## 2011-02-17 ENCOUNTER — Telehealth: Payer: Self-pay | Admitting: *Deleted

## 2011-02-17 MED ORDER — PREDNISONE 20 MG PO TABS: ORAL_TABLET | ORAL | Status: DC

## 2011-02-17 NOTE — Telephone Encounter (Signed)
Message copied by Romualdo Bolk on Tue Feb 17, 2011  2:39 PM ------      Message from: [facility], [place] [facility]      Created: Mon Feb 16, 2011  5:07 PM       advis patient that x ray is negative .Marland Kitchen If antihistamine not helping in the next few days then       Rx prednisone 20 mg   Take 3 per day for 3 days then 2 poer day for 3 days then stop.             ROV in not resolved in another  Few weeks

## 2011-02-17 NOTE — Telephone Encounter (Signed)
Pt aware and rx sent to pharmacy. 

## 2011-02-19 ENCOUNTER — Other Ambulatory Visit: Payer: Self-pay | Admitting: Internal Medicine

## 2011-02-19 NOTE — Telephone Encounter (Signed)
Per Dr. Fabian Sharp- Ok x 1.

## 2011-02-21 DIAGNOSIS — Z87891 Personal history of nicotine dependence: Secondary | ICD-10-CM

## 2011-02-21 HISTORY — DX: Personal history of nicotine dependence: Z87.891

## 2011-04-15 ENCOUNTER — Ambulatory Visit (INDEPENDENT_AMBULATORY_CARE_PROVIDER_SITE_OTHER): Payer: Medicare Other | Admitting: Internal Medicine

## 2011-04-15 ENCOUNTER — Encounter: Payer: Self-pay | Admitting: Internal Medicine

## 2011-04-15 VITALS — BP 130/80 | HR 66 | Wt 122.0 lb

## 2011-04-15 DIAGNOSIS — R7309 Other abnormal glucose: Secondary | ICD-10-CM

## 2011-04-15 DIAGNOSIS — R05 Cough: Secondary | ICD-10-CM

## 2011-04-15 DIAGNOSIS — I10 Essential (primary) hypertension: Secondary | ICD-10-CM

## 2011-04-15 DIAGNOSIS — Z853 Personal history of malignant neoplasm of breast: Secondary | ICD-10-CM

## 2011-04-15 DIAGNOSIS — E785 Hyperlipidemia, unspecified: Secondary | ICD-10-CM

## 2011-04-15 DIAGNOSIS — Z87891 Personal history of nicotine dependence: Secondary | ICD-10-CM

## 2011-04-15 DIAGNOSIS — Z23 Encounter for immunization: Secondary | ICD-10-CM

## 2011-04-15 LAB — GLUCOSE, POCT (MANUAL RESULT ENTRY): POC Glucose: 125

## 2011-04-15 NOTE — Patient Instructions (Signed)
Continue lifestyle intervention healthy eating and exercise . Ice cream ok occassionaly.  Wellness visit in 6 months  And wlll do labs at that visit

## 2011-04-18 ENCOUNTER — Encounter: Payer: Self-pay | Admitting: Internal Medicine

## 2011-04-18 NOTE — Assessment & Plan Note (Signed)
No change in meds.

## 2011-04-18 NOTE — Assessment & Plan Note (Signed)
Get a mammogram  Is overdue

## 2011-04-18 NOTE — Progress Notes (Signed)
  Subjective:    Patient ID: [name] [name], female    DOB: 12-23-1931, 77 y.o.   MRN: [id number]  HPI Patient comes in today for follow up of  multiple medical problems.  hypertension Hyperglycemia   worreid about diabetes .  Limits  Sugars   Some sugar free ice cream no new infecitons numbness falling or vision change UI no change in meds  LIPIDS: still taking meds no known side effects.  Cough is finally gone see last notes   Review of SystemsNo fever cp sob edema new rashes bleeding numbness  utifor now  IC no change Very active walks regularly .  No falling  Past history family history social history reviewed in the electronic medical record.     Objective:   Physical Exam Physical Exam: Vital signs reviewed [id number] is a well-developed well-nourished alert cooperative  white female who appears her stated age or younger  in no acute distress.  HEENT: normocephalic  traumatic , Eyes: PERRL EOM's full, conjunctiva clear glasses, Nares: paten,t no deformity discharge or tenderness., Ears: no deformity EAC's clear TMs with normal landmarks. Mouth: clear OP, no lesions, edema.  Moist mucous membranes. NECK: supple without masses, thyromegaly or bruits. No adenopathy CHEST/PULM:  Clear to auscultation and percussion breath sounds equal no wheeze , rales or rhonchi. Breast prosthesis CV: PMI is nondisplaced, S1 S2 no gallops, murmurs, rubs. Peripheral pulses are full without delay.No  ABDOMEN: Bowel sounds normal nontender  No guard or rebound, no hepato splenomegal no CVA tenderness.   Extremtities:  No clubbing cyanosis or edema, no acute joint swelling or redness no focal atrophy  Mild OA changes NEURO:  Oriented x3, cranial nerves 3-12 appear to be intact, no obvious focal weakness,gait within normal limits  SKIN: No acute rashes normal turgor, color, no bruising or petechiae.  Age related changes PSYCH: Oriented, good eye contact, no obvious depression anxiety, cognition and  judgment appear normal. Random bg  nl    Assessment & Plan:    Total visit > 50% spent counseling and coordinating care

## 2011-04-18 NOTE — Assessment & Plan Note (Signed)
Continue lifestyle intervention healthy eating and exercise .  dont lose weight with this

## 2011-04-18 NOTE — Assessment & Plan Note (Signed)
No change in meds controlled

## 2011-04-18 NOTE — Assessment & Plan Note (Signed)
Remain tobacco free

## 2011-04-18 NOTE — Assessment & Plan Note (Signed)
Resolved

## 2011-04-21 DIAGNOSIS — Z23 Encounter for immunization: Secondary | ICD-10-CM

## 2011-05-24 ENCOUNTER — Other Ambulatory Visit: Payer: Self-pay | Admitting: Internal Medicine

## 2011-08-20 ENCOUNTER — Other Ambulatory Visit: Payer: Self-pay | Admitting: Internal Medicine

## 2011-09-11 ENCOUNTER — Other Ambulatory Visit: Payer: Self-pay | Admitting: Internal Medicine

## 2011-09-11 DIAGNOSIS — Z1231 Encounter for screening mammogram for malignant neoplasm of breast: Secondary | ICD-10-CM

## 2011-09-23 ENCOUNTER — Other Ambulatory Visit: Payer: Self-pay | Admitting: Internal Medicine

## 2011-09-23 ENCOUNTER — Ambulatory Visit
Admission: RE | Admit: 2011-09-23 | Discharge: 2011-09-23 | Disposition: A | Discharge status: Home / Self Care | Payer: Medicare Other | Source: Ambulatory Visit | Attending: Internal Medicine | Admitting: Internal Medicine

## 2011-09-23 DIAGNOSIS — Z1231 Encounter for screening mammogram for malignant neoplasm of breast: Secondary | ICD-10-CM

## 2011-10-20 ENCOUNTER — Ambulatory Visit (INDEPENDENT_AMBULATORY_CARE_PROVIDER_SITE_OTHER): Payer: Medicare Other | Admitting: Internal Medicine

## 2011-10-20 ENCOUNTER — Encounter: Payer: Self-pay | Admitting: Internal Medicine

## 2011-10-20 VITALS — BP 130/72 | HR 78 | Ht 63.5 in | Wt 124.0 lb

## 2011-10-20 DIAGNOSIS — I10 Essential (primary) hypertension: Secondary | ICD-10-CM

## 2011-10-20 DIAGNOSIS — E785 Hyperlipidemia, unspecified: Secondary | ICD-10-CM

## 2011-10-20 DIAGNOSIS — H9190 Unspecified hearing loss, unspecified ear: Secondary | ICD-10-CM

## 2011-10-20 DIAGNOSIS — R32 Unspecified urinary incontinence: Secondary | ICD-10-CM

## 2011-10-20 DIAGNOSIS — Z0000 Encounter for general adult medical examination without abnormal findings: Secondary | ICD-10-CM

## 2011-10-20 DIAGNOSIS — H9192 Unspecified hearing loss, left ear: Secondary | ICD-10-CM

## 2011-10-20 DIAGNOSIS — Z79899 Other long term (current) drug therapy: Secondary | ICD-10-CM

## 2011-10-20 DIAGNOSIS — R7309 Other abnormal glucose: Secondary | ICD-10-CM

## 2011-10-20 DIAGNOSIS — Z853 Personal history of malignant neoplasm of breast: Secondary | ICD-10-CM

## 2011-10-20 DIAGNOSIS — N3010 Interstitial cystitis (chronic) without hematuria: Secondary | ICD-10-CM

## 2011-10-20 HISTORY — DX: Unspecified hearing loss, left ear: H91.92

## 2011-10-20 LAB — CBC WITH DIFFERENTIAL/PLATELET
Basophils Absolute: 0 10*3/uL (ref 0.0–0.1)
Basophils Relative: 0.6 % (ref 0.0–3.0)
Eosinophils Absolute: 0.1 10*3/uL (ref 0.0–0.7)
Lymphocytes Relative: 28.6 % (ref 12.0–46.0)
MCHC: 33.5 g/dL (ref 30.0–36.0)
MCV: 95 fl (ref 78.0–100.0)
Monocytes Absolute: 0.5 10*3/uL (ref 0.1–1.0)
Neutrophils Relative %: 59.5 % (ref 43.0–77.0)
Platelets: 145 10*3/uL — ABNORMAL LOW (ref 150.0–400.0)
RDW: 12.2 % (ref 11.5–14.6)

## 2011-10-20 LAB — LIPID PANEL
HDL: 61.8 mg/dL (ref >39.00)
Total CHOL/HDL Ratio: 3
Triglycerides: 102 mg/dL (ref 0.0–149.0)

## 2011-10-20 LAB — HEPATIC FUNCTION PANEL
ALT: 28 U/L (ref 0–35)
AST: 30 U/L (ref 0–37)
Albumin: 4.8 g/dL (ref 3.5–5.2)
Alkaline Phosphatase: 63 U/L (ref 39–117)

## 2011-10-20 LAB — BASIC METABOLIC PANEL
CO2: 28 mEq/L (ref 19–32)
Chloride: 104 mEq/L (ref 96–112)
GFR: 73.72 mL/min (ref >60.00)
Glucose, Bld: 103 mg/dL — ABNORMAL HIGH (ref 70–99)
Potassium: 4.6 mEq/L (ref 3.5–5.1)
Sodium: 142 mEq/L (ref 135–145)

## 2011-10-20 NOTE — Progress Notes (Signed)
Subjective:    Patient ID: [name] [name], female    DOB: 06/09/1932, 78 y.o.   MRN: [id number]  HPI Patient comes in today for preventive visit and follow-up of medical issues. Update  history since  last visit: No major changes ; ,injury surgery or hospitalizations. HT on meds nose  LIPIDS  Taking meds  Trying to cut out sweets and sugars weight has maintained   Daughter  Throat cancer    Age 20   Non smoker  Past history family history social history reviewed in the electronic medical record.   Review of Systems    Hearing:   Doesn't co some decrease   Vision:  No limitations at present . Had check  Glasses   Safety:  Has smoke detector and wears seat belts.  No firearms. No excess sun exposure. Sees dentist regularly.  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  POA  living will .  Memory: Felt to be good  , no concern from her or her family.  Depression: No anhedonia unusual crying or depressive symptoms  Nutrition: Eats well balanced diet; adequate calcium and vitamin D. No swallowing chewiing problems.  Injury: no major injuries in the last six months.  Other healthcare providers:  Reviewed today .  Social:  Lives with husband married. No pets.   Preventive parameters: up-to-date on  ADLS:   There are no problems or need for assistance  driving, feeding, obtaining food, dressing, toileting and bathing, managing money using phone. She is independent. ROS:  GEN/ HEENT: No fever, significant weight changes sweats headaches vision problems hearing changes, CV/ PULM; No chest pain shortness of breath cough, syncope,edema  change in exercise tolerance. GI /GU: No adominal pain, vomiting, change in bowel habits. No blood in the stool. Has som UI  meds not that helpful SKIN/HEME: ,no acute skin rashes suspicious lesions or bleeding. No lymphadenopathy, nodules, masses.  NEURO/ PSYCH:  No neurologic signs  such as weakness numbness. No depression anxiety. IMM/ Allergy: No unusual infections.  Allergy .   REST of 12 system review negative except as per HPI       Objective:   Physical Exam Physical Exam: Vital signs reviewed ION:[id number] is a well-developed well-nourished alert cooperative  white female who appears her stated age in no acute distress.  HEENT: normocephalic atraumatic , Eyes: PERRL EOM's full, conjunctiva clear, Nares: paten,t no deformity discharge or tenderness., Ears: no deformity EAC's clear TMs with normal landmarks. Mouth: clear OP, no lesions, edema.  Moist mucous membranes. Dentition in adequate repair. NECK: supple without masses, thyromegaly or bruits. CHEST/PULM:  Clear to auscultation and percussion breath sounds equal no wheeze , rales or rhonchi. No chest wall deformities or tenderness. BREAST left missing right wnl no nodules  Axilla clear  CV: PMI is nondisplaced, S1 S2 no gallops, murmurs, rubs. Peripheral pulses are full without delay.No JVD .  ABDOMEN: Bowel sounds normal nontender  No guard or rebound, no hepato splenomegal no CVA tenderness.  No hernia. Extremtities:  No clubbing cyanosis or edema, no acute joint swelling or redness no focal atrophy NEURO:  Oriented x3, cranial nerves 3-12 appear to be intact, no obvious focal weakness,gait within normal limits no abnormal reflexes or asymmetrical SKIN: No acute rashes normal turgor, color, no bruising or petechiae. PSYCH: Oriented, good eye contact, no obvious depression anxiety, cognition and judgment appear normal. LN: no cervical axillary inguinal  adenopathy      Assessment & Plan:    Preventive Health Care Counseled regarding healthy nutrition, exercise, sleep, injury prevention, calcium vit d and healthy weight . UTD  HT :    stable    LIPIDS   Labs today.  Continue meds no se of meds   Hearing.  assymetrical    Hearing   Loss   Issues  Family hx .  No obv neur underlying but  rec audiologist .    Will refer   Hx of breast cancer

## 2011-10-20 NOTE — Patient Instructions (Addendum)
Continue healthy eating and exercise.  we'll contact you about an audiologic referral. Will notify you  of labs when available.  ROV in 6 months if every thing is good.

## 2011-10-21 ENCOUNTER — Encounter: Payer: Self-pay | Admitting: Internal Medicine

## 2011-10-22 ENCOUNTER — Encounter: Payer: Self-pay | Admitting: *Deleted

## 2011-10-22 NOTE — Progress Notes (Signed)
Quick Note:    Letter sent to pt.  ______

## 2012-02-18 ENCOUNTER — Other Ambulatory Visit: Payer: Self-pay | Admitting: Internal Medicine

## 2012-04-21 ENCOUNTER — Ambulatory Visit: Payer: Medicare Other | Admitting: Internal Medicine

## 2012-04-26 ENCOUNTER — Ambulatory Visit (INDEPENDENT_AMBULATORY_CARE_PROVIDER_SITE_OTHER): Payer: Medicare Other | Admitting: Internal Medicine

## 2012-04-26 ENCOUNTER — Encounter: Payer: Self-pay | Admitting: Internal Medicine

## 2012-04-26 VITALS — BP 130/70 | HR 74 | Temp 98.8°F | Wt 122.0 lb

## 2012-04-26 DIAGNOSIS — E785 Hyperlipidemia, unspecified: Secondary | ICD-10-CM

## 2012-04-26 DIAGNOSIS — Z23 Encounter for immunization: Secondary | ICD-10-CM

## 2012-04-26 DIAGNOSIS — R7309 Other abnormal glucose: Secondary | ICD-10-CM

## 2012-04-26 DIAGNOSIS — R32 Unspecified urinary incontinence: Secondary | ICD-10-CM

## 2012-04-26 DIAGNOSIS — Z853 Personal history of malignant neoplasm of breast: Secondary | ICD-10-CM

## 2012-04-26 DIAGNOSIS — I10 Essential (primary) hypertension: Secondary | ICD-10-CM

## 2012-04-26 NOTE — Progress Notes (Signed)
  Subjective:    Patient ID: [name] [name], female    DOB: 1931-09-19, 78 y.o.   MRN: [id number]  HPI Patient comes in today for follow up of  multiple medical problems.  Husband 3 ed visits varipous issues  stent cad and needing surgeries for shoulder.  Unable to do as many exercises and not as good a diet. She hasn't lost anymore weight. She needs a new prosthesis for her brought mastectomy on the left. The 01 is torn and sagging. Bladder symptoms okay in the day still has nocturia. Not really taking the medication doesn't seem to help that much Hypertension and still taking medication not checking her readings recently Lipids taking medication no side effect Hyperglycemia has continue to cut out her jellybeans and chocolate. Review of Systems Negative for chest pain shortness of breath syncope skin changes using or bleeding rest as per history of present illness  Past history family history social history reviewed in the electronic medical record. Outpatient Encounter Prescriptions as of 04/26/2012  Medication Sig Dispense Refill  . amLODipine (NORVASC) 5 MG tablet TAKE ONE TABLET BY MOUTH EVERY DAY  90 tablet  2  . Ascorbic Acid (VITAMIN C) 500 MG tablet Take 500 mg by mouth daily. 1 bid      . calcium carbonate 1250 MG capsule Take 1,250 mg by mouth 2 (two) times daily with a meal.        . Cinnamon 500 MG capsule Take 500 mg by mouth daily. 1 bid      . fish oil-omega-3 fatty acids 1000 MG capsule Take 2 g by mouth daily.        . magnesium 30 MG tablet Take 30 mg by mouth 2 (two) times daily.        . Multiple Vitamins-Minerals (CENTRUM SILVER PO) Take by mouth.        . oxybutynin (DITROPAN) 5 MG tablet Take 5 mg by mouth daily. Prn        . simvastatin (ZOCOR) 20 MG tablet TAKE ONE TABLET BY MOUTH EVERY DAY  90 tablet  2  . VITAMIN D, CHOLECALCIFEROL, PO Take by mouth.             Objective:   Physical Exam BP 154/74  Pulse 74  Temp 98.8 F (37.1 C) (Oral)  Wt 122 lb  (55.339 kg)  SpO2 98% Repeat readings are improved 138/70 120/64 WDWN in nad  Wt Readings from Last 3 Encounters:  04/26/12 122 lb (55.339 kg)  10/20/11 124 lb (56.246 kg)  04/15/11 122 lb (55.339 kg)   neck without significant masses no bruits are heard chest clear to auscultation cardiac S1-S2 no gallops or murmurs are heard Abdomen soft without organomegaly or obvious masses Negative CCE Chest left breast absent. Postprandial blood sugar 106    Assessment & Plan:   HT  improved on second reading continue same medication LIPIDS continue same medication at this time no obvious side effects Breast cancer  status post mastectomy prescription written for new prosthesis contact us at more information needed BG has been holding steady in the mildly elevated prediabetic range but she has lost weight on her diet and activity and is doing well. Would not have her lose more weight but does continue healthy diet. Flu shot today come back in 6 months for wellness check full set of labs. Urinary nocturia discussed this no change.  Total visit > 50% spent counseling and coordinating care

## 2012-04-26 NOTE — Patient Instructions (Addendum)
Continue lifestyle intervention healthy eating and exercise . Weight is ok but don't lose anymore Stay physically active .  Wellness visit and  full set of labs in 6 months . Continue medications at this time.

## 2012-10-25 ENCOUNTER — Ambulatory Visit (INDEPENDENT_AMBULATORY_CARE_PROVIDER_SITE_OTHER): Payer: Medicare Other | Admitting: Internal Medicine

## 2012-10-25 ENCOUNTER — Encounter: Payer: Self-pay | Admitting: Internal Medicine

## 2012-10-25 VITALS — BP 126/78 | HR 74 | Temp 97.9°F | Ht 63.0 in | Wt 122.0 lb

## 2012-10-25 DIAGNOSIS — R7309 Other abnormal glucose: Secondary | ICD-10-CM

## 2012-10-25 DIAGNOSIS — Z853 Personal history of malignant neoplasm of breast: Secondary | ICD-10-CM

## 2012-10-25 DIAGNOSIS — Z0000 Encounter for general adult medical examination without abnormal findings: Secondary | ICD-10-CM

## 2012-10-25 DIAGNOSIS — E785 Hyperlipidemia, unspecified: Secondary | ICD-10-CM

## 2012-10-25 DIAGNOSIS — I10 Essential (primary) hypertension: Secondary | ICD-10-CM

## 2012-10-25 DIAGNOSIS — M1990 Unspecified osteoarthritis, unspecified site: Secondary | ICD-10-CM

## 2012-10-25 HISTORY — DX: Encounter for general adult medical examination without abnormal findings: Z00.00

## 2012-10-25 LAB — CBC WITH DIFFERENTIAL/PLATELET
Eosinophils Relative: 2.1 % (ref 0.0–5.0)
HCT: 42.3 % (ref 36.0–46.0)
Hemoglobin: 14.2 g/dL (ref 12.0–15.0)
Lymphs Abs: 1.3 10*3/uL (ref 0.7–4.0)
MCV: 92.2 fl (ref 78.0–100.0)
Monocytes Absolute: 0.5 10*3/uL (ref 0.1–1.0)
Monocytes Relative: 10.4 % (ref 3.0–12.0)
Neutro Abs: 2.9 10*3/uL (ref 1.4–7.7)
RDW: 12.8 % (ref 11.5–14.6)
WBC: 4.9 10*3/uL (ref 4.5–10.5)

## 2012-10-25 LAB — BASIC METABOLIC PANEL
BUN: 18 mg/dL (ref 6–23)
Chloride: 103 mEq/L (ref 96–112)
GFR: 80.45 mL/min (ref >60.00)
Glucose, Bld: 97 mg/dL (ref 70–99)
Potassium: 4.2 mEq/L (ref 3.5–5.1)
Sodium: 140 mEq/L (ref 135–145)

## 2012-10-25 LAB — HEPATIC FUNCTION PANEL
AST: 25 U/L (ref 0–37)
Alkaline Phosphatase: 58 U/L (ref 39–117)
Total Bilirubin: 1 mg/dL (ref 0.3–1.2)

## 2012-10-25 LAB — LIPID PANEL
HDL: 53.1 mg/dL (ref >39.00)
VLDL: 21.8 mg/dL (ref 0.0–40.0)

## 2012-10-25 LAB — TSH: TSH: 1.7 u[IU]/mL (ref 0.35–5.50)

## 2012-10-25 NOTE — Progress Notes (Signed)
Chief Complaint  Patient presents with  . Annual Exam    Medicare  . Hyperlipidemia  . Hypertension    HPI: Patient comes in today for Preventive Medicare wellness visit . No major injuries, ed visits ,hospitalizations , new medications since last visit. BP  No chang emeds no se  New edema LIPIDS no se of med reported  Hasn't done a mammogram in a while. There is reasons   Hearing:  Has some tinnitus doesn't want hearing aids but to see audiologist   Vision:  No limitations at present . Last eye check UTD glasses  See eye doc   Safety:  Has smoke detector and wears seat belts.  No firearms. No excess sun exposure. Sees dentist regularly.  Falls: no  Advance directive :  Reviewed   Memory: Felt to be good  , no concern from her or her family.  Depression: No anhedonia unusual crying or depressive symptoms  Nutrition: Eats well balanced diet; adequate calcium and vitamin D. No swallowing chewing problems.  Injury: no major injuries in the last six months.  Other healthcare providers:  Reviewed today .  Social:  Lives with spouse married. No pets.   Preventive parameters: up-to-date  Reviewed   ADLS:   There are no problems or need for assistance  driving, feeding, obtaining food, dressing, toileting and bathing, managing money using phone. She is independent. 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 GI /GU: No adominal pain, vomiting, change in bowel habits. No blood in the stool. No significant GU symptoms. SKIN/HEME: ,no acute skin rashes suspicious lesions or bleeding. No lymphadenopathy, nodules, masses.  NEURO/ PSYCH:  No neurologic signs  such as weakness numbness. No depression anxiety. IMM/ Allergy: No unusual infections.  Allergy .   REST of 12 system review negative except as per HPI  Daughter with mouth cancer  Past Medical History  Diagnosis Date  . Breast cancer 1978    left breast  . Osteoarthritis   . Urinary incontinence   . Hyperlipidemia   . Hypertension   . Cystitis, interstitial   . Adenomatous colon polyp      4 2008  . Fracture     left ankle and rt wrist  . FRACTURE, ANKLE, LEFT 12/14/2006    Qualifier: Diagnosis of  By: Lawernce Ion, CMA (AAMA), Bethann Berkshire   . FRACTURE, WRIST 12/14/2006    Qualifier: Diagnosis of  By: Lawernce Ion, CMA (AAMA), Bethann Berkshire     Family History  Problem Relation Age of Onset  . Suicidality Mother   . Heart attack Father   . Cancer - Other Daughter     throat cancer     History   Social History  . Marital Status: Married    Spouse Name: N/A    Number of Children: N/A  . Years of Education: N/A   Social History Main Topics  . Smoking status: Former Games developer  . Smokeless tobacco: None  . Alcohol Use: Yes  . Drug Use: No  . Sexually Active: None   Other Topics Concern  . None   Social History Narrative   Retired  Married   HH of 2   Husband with medical problems   g5 p3   Ex smoker  Quit 33 years ago  1952- 1968                   Outpatient Encounter Prescriptions as of 10/25/2012  Medication Sig Dispense Refill  . amLODipine (NORVASC) 5 MG tablet TAKE ONE TABLET BY MOUTH EVERY DAY  90 tablet  2  . Ascorbic Acid (VITAMIN C) 500 MG tablet Take 500 mg by mouth daily. 1 bid      . calcium carbonate 1250 MG capsule Take 1,250 mg by mouth 2 (two) times daily with a meal.        . Cinnamon 500 MG capsule Take 500 mg by mouth daily. 1 bid      . fish oil-omega-3 fatty acids 1000 MG capsule Take 2 g by mouth daily.        . magnesium 30 MG tablet Take 30 mg by mouth 2 (two) times daily.        . Multiple Vitamins-Minerals (CENTRUM SILVER PO) Take by mouth.         . simvastatin (ZOCOR) 20 MG tablet TAKE ONE TABLET BY MOUTH EVERY DAY  90 tablet  2  . VITAMIN D, CHOLECALCIFEROL, PO Take by mouth.        . [DISCONTINUED] oxybutynin (DITROPAN) 5 MG tablet Take 5 mg by mouth daily. Prn         No facility-administered encounter medications on file as of 10/25/2012.    EXAM:  BP 126/78  Pulse 74  Temp(Src) 97.9 F (36.6 C) (Oral)  Ht 5\' 3"  (1.6 m)  Wt 122 lb (55.339 kg)  BMI 21.62 kg/m2  SpO2 98%  Body mass index is 21.62 kg/(m^2).  Physical Exam: Vital signs reviewed JWJ:XBJY is a well-developed well-nourished alert cooperative   who appears stated age in no acute distress. Nontoxic good balance wearing glasses HEENT: normocephalic atraumatic , Eyes: PERRL EOM's full, , Nares: paten,t no deformity discharge or tenderness., Ears: no deformity EAC's clear TMs with normal landmarks. Mouth: clear OP, no lesions, edema.  Moist mucous membranes. Dentition in adequate repair. NECK: supple without masses, thyromegaly or bruits. CHEST/PULM:  Clear to auscultation and percussion breath sounds equal no wheeze , rales or rhonchi. No chest wall deformities or tenderness. Breast:   Left breast is absent right breast normal by inspection . No dimpling, discharge, masses, tenderness or discharge . Axilla is clear CV: PMI is nondisplaced, S1 S2 no gallops, murmurs, rubs. Peripheral pulses are full without delay.No JVD .  ABDOMEN: Bowel sounds normal nontender  No guard or rebound, no hepato splenomegal no CVA tenderness.  No hernia. Extremtities:  No clubbing cyanosis or edema, no acute joint swelling or redness no focal atrophy osteoarthritic changes in the hands but no severe deformity normal movement NEURO:  Oriented x3, cranial nerves 3-12 appear to be intact, no obvious focal weakness,gait within normal limits no abnormal reflexes or asymmetrical SKIN: No acute rashes normal turgor, color, no bruising or petechiae. PSYCH: Oriented, good eye contact, no  obvious depression anxiety, cognition and judgment appear normal. LN: no cervical axillary inguinal adenopathy No noted deficits in memory, attention, and speech. Wt Readings from Last 3 Encounters:  10/25/12 122 lb (55.339 kg)  04/26/12 122 lb (55.339 kg)  10/20/11 124 lb (56.246 kg)     Lab Results  Component Value Date   WBC 4.8 10/20/2011   HGB 14.4 10/20/2011  HCT 43.0 10/20/2011   PLT 145.0* 10/20/2011   GLUCOSE 103* 10/20/2011   CHOL 168 10/20/2011   TRIG 102.0 10/20/2011   HDL 61.80 10/20/2011   LDLDIRECT 165.0 12/07/2006   LDLCALC 86 10/20/2011   ALT 28 10/20/2011   AST 30 10/20/2011   NA 142 10/20/2011   K 4.6 10/20/2011   CL 104 10/20/2011   CREATININE 0.8 10/20/2011   BUN 20 10/20/2011   CO2 28 10/20/2011   TSH 1.15 10/20/2011   HGBA1C 5.9 10/20/2011    ASSESSMENT AND PLAN:  Discussed the following assessment and plan:  Visit for preventive health examination - Plan: Basic metabolic panel, CBC with Differential, Hemoglobin A1c, Hepatic function panel, TSH, Lipid panel  BREAST CANCER, HX OF - dsic mammography  - Plan: Basic metabolic panel, CBC with Differential, Hemoglobin A1c, Hepatic function panel, TSH, Lipid panel  FASTING HYPERGLYCEMIA - Plan: Basic metabolic panel, CBC with Differential, Hemoglobin A1c, Hepatic function panel, TSH, Lipid panel  HYPERLIPIDEMIA - Plan: Basic metabolic panel, CBC with Differential, Hemoglobin A1c, Hepatic function panel, TSH, Lipid panel  HYPERTENSION - Plan: Basic metabolic panel, CBC with Differential, Hemoglobin A1c, Hepatic function panel, TSH, Lipid panel  OSTEOARTHRITIS - Plan: Basic metabolic panel, CBC with Differential, Hemoglobin A1c, Hepatic function panel, TSH, Lipid panel  Patient Care Team: Madelin Headings, MD as PCP - General Marcine Matar, MD (Urology)  Patient Instructions  Continue lifestyle intervention healthy eating and exercise . Check on shingles vaccine and can return for injection when you wise . Still  advise  mammorgram cause of hx of breast cancer but you can decide on that.   Will notify you  of labs when available. Agree with getting audiology check.  Preventive Care for Adults, Female A healthy lifestyle and preventive care can promote health and wellness. Preventive health guidelines for women include the following key practices.  A routine yearly physical is a good way to check with your caregiver about your health and preventive screening. It is a chance to share any concerns and updates on your health, and to receive a thorough exam.  Visit your dentist for a routine exam and preventive care every 6 months. Brush your teeth twice a day and floss once a day. Good oral hygiene prevents tooth decay and gum disease.  The frequency of eye exams is based on your age, health, family medical history, use of contact lenses, and other factors. Follow your caregiver's recommendations for frequency of eye exams.  Eat a healthy diet. Foods like vegetables, fruits, whole grains, low-fat dairy products, and lean protein foods contain the nutrients you need without too many calories. Decrease your intake of foods high in solid fats, added sugars, and salt. Eat the right amount of calories for you.Get information about a proper diet from your caregiver, if necessary.  Regular physical exercise is one of the most important things you can do for your health. Most adults should get at least 150 minutes of moderate-intensity exercise (any activity that increases your heart rate and causes you to sweat) each week. In addition, most adults need muscle-strengthening exercises on 2 or more days a week.  Maintain a healthy weight. The body mass index (BMI) is a screening tool to identify possible weight problems. It provides an estimate of body fat based on height and weight. Your caregiver can help determine your BMI, and can help you achieve or maintain a healthy weight.For adults 20 years and older:  A BMI  below 18.5 is considered  underweight.  A BMI of 18.5 to 24.9 is normal.  A BMI of 25 to 29.9 is considered overweight.  A BMI of 30 and above is considered obese.  Maintain normal blood lipids and cholesterol levels by exercising and minimizing your intake of saturated fat. Eat a balanced diet with plenty of fruit and vegetables. Blood tests for lipids and cholesterol should begin at age 30 and be repeated every 5 years. If your lipid or cholesterol levels are high, you are over 50, or you are at high risk for heart disease, you may need your cholesterol levels checked more frequently.Ongoing high lipid and cholesterol levels should be treated with medicines if diet and exercise are not effective.  If you smoke, find out from your caregiver how to quit. If you do not use tobacco, do not start.  If you are pregnant, do not drink alcohol. If you are breastfeeding, be very cautious about drinking alcohol. If you are not pregnant and choose to drink alcohol, do not exceed 1 drink per day. One drink is considered to be 12 ounces (355 mL) of beer, 5 ounces (148 mL) of wine, or 1.5 ounces (44 mL) of liquor.  Avoid use of street drugs. Do not share needles with anyone. Ask for help if you need support or instructions about stopping the use of drugs.  High blood pressure causes heart disease and increases the risk of stroke. Your blood pressure should be checked at least every 1 to 2 years. Ongoing high blood pressure should be treated with medicines if weight loss and exercise are not effective.  If you are 41 to 77 years old, ask your caregiver if you should take aspirin to prevent strokes.  Diabetes screening involves taking a blood sample to check your fasting blood sugar level. This should be done once every 3 years, after age 84, if you are within normal weight and without risk factors for diabetes. Testing should be considered at a younger age or be carried out more frequently if you are  overweight and have at least 1 risk factor for diabetes.  Breast cancer screening is essential preventive care for women. You should practice "breast self-awareness." This means understanding the normal appearance and feel of your breasts and may include breast self-examination. Any changes detected, no matter how small, should be reported to a caregiver. Women in their 65s and 30s should have a clinical breast exam (CBE) by a caregiver as part of a regular health exam every 1 to 3 years. After age 35, women should have a CBE every year. Starting at age 38, women should consider having a mammography (breast X-ray test) every year. Women who have a family history of breast cancer should talk to their caregiver about genetic screening. Women at a high risk of breast cancer should talk to their caregivers about having magnetic resonance imaging (MRI) and a mammography every year.  The Pap test is a screening test for cervical cancer. A Pap test can show cell changes on the cervix that might become cervical cancer if left untreated. A Pap test is a procedure in which cells are obtained and examined from the lower end of the uterus (cervix).  Women should have a Pap test starting at age 89.  Between ages 53 and 25, Pap tests should be repeated every 2 years.  Beginning at age 50, you should have a Pap test every 3 years as long as the past 3 Pap tests have been normal.  Some women  have medical problems that increase the chance of getting cervical cancer. Talk to your caregiver about these problems. It is especially important to talk to your caregiver if a new problem develops soon after your last Pap test. In these cases, your caregiver may recommend more frequent screening and Pap tests.  The above recommendations are the same for women who have or have not gotten the vaccine for human papillomavirus (HPV).  If you had a hysterectomy for a problem that was not cancer or a condition that could lead to  cancer, then you no longer need Pap tests. Even if you no longer need a Pap test, a regular exam is a good idea to make sure no other problems are starting.  If you are between ages 13 and 35, and you have had normal Pap tests going back 10 years, you no longer need Pap tests. Even if you no longer need a Pap test, a regular exam is a good idea to make sure no other problems are starting.  If you have had past treatment for cervical cancer or a condition that could lead to cancer, you need Pap tests and screening for cancer for at least 20 years after your treatment.  If Pap tests have been discontinued, risk factors (such as a new sexual partner) need to be reassessed to determine if screening should be resumed.  The HPV test is an additional test that may be used for cervical cancer screening. The HPV test looks for the virus that can cause the cell changes on the cervix. The cells collected during the Pap test can be tested for HPV. The HPV test could be used to screen women aged 24 years and older, and should be used in women of any age who have unclear Pap test results. After the age of 95, women should have HPV testing at the same frequency as a Pap test.  Colorectal cancer can be detected and often prevented. Most routine colorectal cancer screening begins at the age of 34 and continues through age 91. However, your caregiver may recommend screening at an earlier age if you have risk factors for colon cancer. On a yearly basis, your caregiver may provide home test kits to check for hidden blood in the stool. Use of a small camera at the end of a tube, to directly examine the colon (sigmoidoscopy or colonoscopy), can detect the earliest forms of colorectal cancer. Talk to your caregiver about this at age 77, when routine screening begins. Direct examination of the colon should be repeated every 5 to 10 years through age 36, unless early forms of pre-cancerous polyps or small growths are  found.  Hepatitis C blood testing is recommended for all people born from [birth year] through 1965 and any individual with known risks for hepatitis C.  Practice safe sex. Use condoms and avoid high-risk sexual practices to reduce the spread of sexually transmitted infections (STIs). STIs include gonorrhea, chlamydia, syphilis, trichomonas, herpes, HPV, and human immunodeficiency virus (HIV). Herpes, HIV, and HPV are viral illnesses that have no cure. They can result in disability, cancer, and death. Sexually active women aged 59 and younger should be checked for chlamydia. Older women with new or multiple partners should also be tested for chlamydia. Testing for other STIs is recommended if you are sexually active and at increased risk.  Osteoporosis is a disease in which the bones lose minerals and strength with aging. This can result in serious bone fractures. The risk of osteoporosis can be  identified using a bone density scan. Women ages 65 and over and women at risk for fractures or osteoporosis should discuss screening with their caregivers. Ask your caregiver whether you should take a calcium supplement or vitamin D to reduce the rate of osteoporosis.  Menopause can be associated with physical symptoms and risks. Hormone replacement therapy is available to decrease symptoms and risks. You should talk to your caregiver about whether hormone replacement therapy is right for you.  Use sunscreen with sun protection factor (SPF) of 30 or more. Apply sunscreen liberally and repeatedly throughout the day. You should seek shade when your shadow is shorter than you. Protect yourself by wearing long sleeves, pants, a wide-brimmed hat, and sunglasses year round, whenever you are outdoors.  Once a month, do a whole body skin exam, using a mirror to look at the skin on your back. Notify your caregiver of new moles, moles that have irregular borders, moles that are larger than a pencil eraser, or moles that have  changed in shape or color.  Stay current with required immunizations.  Influenza. You need a dose every fall (or winter). The composition of the flu vaccine changes each year, so being vaccinated once is not enough.  Pneumococcal polysaccharide. You need 1 to 2 doses if you smoke cigarettes or if you have certain chronic medical conditions. You need 1 dose at age 80 (or older) if you have never been vaccinated.  Tetanus, diphtheria, pertussis (Tdap, Td). Get 1 dose of Tdap vaccine if you are younger than age 34, are over 15 and have contact with an infant, are a Research scientist (physical sciences), are pregnant, or simply want to be protected from whooping cough. After that, you need a Td booster dose every 10 years. Consult your caregiver if you have not had at least 3 tetanus and diphtheria-containing shots sometime in your life or have a deep or dirty wound.  HPV. You need this vaccine if you are a woman age 54 or younger. The vaccine is given in 3 doses over 6 months.  Measles, mumps, rubella (MMR). You need at least 1 dose of MMR if you were born in 1957 or later. You may also need a second dose.  Meningococcal. If you are age 25 to 28 and a first-year college student living in a residence hall, or have one of several medical conditions, you need to get vaccinated against meningococcal disease. You may also need additional booster doses.  Zoster (shingles). If you are age 32 or older, you should get this vaccine.  Varicella (chickenpox). If you have never had chickenpox or you were vaccinated but received only 1 dose, talk to your caregiver to find out if you need this vaccine.  Hepatitis A. You need this vaccine if you have a specific risk factor for hepatitis A virus infection or you simply wish to be protected from this disease. The vaccine is usually given as 2 doses, 6 to 18 months apart.  Hepatitis B. You need this vaccine if you have a specific risk factor for hepatitis B virus infection or you  simply wish to be protected from this disease. The vaccine is given in 3 doses, usually over 6 months. Preventive Services / Frequency Ages 19 to 79  Blood pressure check.** / Every 1 to 2 years.  Lipid and cholesterol check.** / Every 5 years beginning at age 20.  Clinical breast exam.** / Every 3 years for women in their 5s and 30s.  Pap test.** / Every 2  years from ages 40 through 38. Every 3 years starting at age 79 through age 22 or 39 with a history of 3 consecutive normal Pap tests.  HPV screening.** / Every 3 years from ages 66 through ages 64 to 67 with a history of 3 consecutive normal Pap tests.  Hepatitis C blood test.** / For any individual with known risks for hepatitis C.  Skin self-exam. / Monthly.  Influenza immunization.** / Every year.  Pneumococcal polysaccharide immunization.** / 1 to 2 doses if you smoke cigarettes or if you have certain chronic medical conditions.  Tetanus, diphtheria, pertussis (Tdap, Td) immunization. / A one-time dose of Tdap vaccine. After that, you need a Td booster dose every 10 years.  HPV immunization. / 3 doses over 6 months, if you are 69 and younger.  Measles, mumps, rubella (MMR) immunization. / You need at least 1 dose of MMR if you were born in 1957 or later. You may also need a second dose.  Meningococcal immunization. / 1 dose if you are age 76 to 46 and a first-year college student living in a residence hall, or have one of several medical conditions, you need to get vaccinated against meningococcal disease. You may also need additional booster doses.  Varicella immunization.** / Consult your caregiver.  Hepatitis A immunization.** / Consult your caregiver. 2 doses, 6 to 18 months apart.  Hepatitis B immunization.** / Consult your caregiver. 3 doses usually over 6 months. Ages 48 to 65  Blood pressure check.** / Every 1 to 2 years.  Lipid and cholesterol check.** / Every 5 years beginning at age 45.  Clinical breast  exam.** / Every year after age 55.  Mammogram.** / Every year beginning at age 53 and continuing for as long as you are in good health. Consult with your caregiver.  Pap test.** / Every 3 years starting at age 73 through age 73 or 70 with a history of 3 consecutive normal Pap tests.  HPV screening.** / Every 3 years from ages 21 through ages 49 to 45 with a history of 3 consecutive normal Pap tests.  Fecal occult blood test (FOBT) of stool. / Every year beginning at age 89 and continuing until age 88. You may not need to do this test if you get a colonoscopy every 10 years.  Flexible sigmoidoscopy or colonoscopy.** / Every 5 years for a flexible sigmoidoscopy or every 10 years for a colonoscopy beginning at age 37 and continuing until age 9.  Hepatitis C blood test.** / For all people born from [birth year] through 1965 and any individual with known risks for hepatitis C.  Skin self-exam. / Monthly.  Influenza immunization.** / Every year.  Pneumococcal polysaccharide immunization.** / 1 to 2 doses if you smoke cigarettes or if you have certain chronic medical conditions.  Tetanus, diphtheria, pertussis (Tdap, Td) immunization.** / A one-time dose of Tdap vaccine. After that, you need a Td booster dose every 10 years.  Measles, mumps, rubella (MMR) immunization. / You need at least 1 dose of MMR if you were born in 1957 or later. You may also need a second dose.  Varicella immunization.** / Consult your caregiver.  Meningococcal immunization.** / Consult your caregiver.  Hepatitis A immunization.** / Consult your caregiver. 2 doses, 6 to 18 months apart.  Hepatitis B immunization.** / Consult your caregiver. 3 doses, usually over 6 months. Ages 78 and over  Blood pressure check.** / Every 1 to 2 years.  Lipid and cholesterol check.** / Every 5  years beginning at age 17.  Clinical breast exam.** / Every year after age 9.  Mammogram.** / Every year beginning at age 77 and continuing  for as long as you are in good health. Consult with your caregiver.  Pap test.** / Every 3 years starting at age 56 through age 27 or 45 with a 3 consecutive normal Pap tests. Testing can be stopped between 65 and 70 with 3 consecutive normal Pap tests and no abnormal Pap or HPV tests in the past 10 years.  HPV screening.** / Every 3 years from ages 20 through ages 48 or 92 with a history of 3 consecutive normal Pap tests. Testing can be stopped between 65 and 70 with 3 consecutive normal Pap tests and no abnormal Pap or HPV tests in the past 10 years.  Fecal occult blood test (FOBT) of stool. / Every year beginning at age 87 and continuing until age 29. You may not need to do this test if you get a colonoscopy every 10 years.  Flexible sigmoidoscopy or colonoscopy.** / Every 5 years for a flexible sigmoidoscopy or every 10 years for a colonoscopy beginning at age 54 and continuing until age 77.  Hepatitis C blood test.** / For all people born from [birth year] through 1963 and any individual with known risks for hepatitis C.  Osteoporosis screening.** / A one-time screening for women ages 95 and over and women at risk for fractures or osteoporosis.  Skin self-exam. / Monthly.  Influenza immunization.** / Every year.  Pneumococcal polysaccharide immunization.** / 1 dose at age 35 (or older) if you have never been vaccinated.  Tetanus, diphtheria, pertussis (Tdap, Td) immunization. / A one-time dose of Tdap vaccine if you are over 65 and have contact with an infant, are a Research scientist (physical sciences), or simply want to be protected from whooping cough. After that, you need a Td booster dose every 10 years.  Varicella immunization.** / Consult your caregiver.  Meningococcal immunization.** / Consult your caregiver.  Hepatitis A immunization.** / Consult your caregiver. 2 doses, 6 to 18 months apart.  Hepatitis B immunization.** / Check with your caregiver. 3 doses, usually over 6 months. ** Family history  and personal history of risk and conditions may change your caregiver's recommendations. Document Released: 09/15/2001 Document Revised: 10/12/2011 Document Reviewed: 12/15/2010 [facility] Patient Information 2013 [place], [place].     Neta Mends. Patina Spanier M.D.  Health Maintenance  Topic Date Due  . Zostavax  09/27/1993  . Influenza Vaccine  04/03/2013  . Tetanus/tdap  02/07/2017  . Colonoscopy  07/12/2019  . Pneumococcal Polysaccharide Vaccine Age 1 And Over  Completed   Health Maintenance Review

## 2012-10-25 NOTE — Patient Instructions (Signed)
Continue lifestyle intervention healthy eating and exercise . Check on shingles vaccine and can return for injection when you wise . Still advise  mammorgram cause of hx of breast cancer but you can decide on that.   Will notify you  of labs when available. Agree with getting audiology check.  Preventive Care for Adults, Female A healthy lifestyle and preventive care can promote health and wellness. Preventive health guidelines for women include the following key practices.  A routine yearly physical is a good way to check with your caregiver about your health and preventive screening. It is a chance to share any concerns and updates on your health, and to receive a thorough exam.  Visit your dentist for a routine exam and preventive care every 6 months. Brush your teeth twice a day and floss once a day. Good oral hygiene prevents tooth decay and gum disease.  The frequency of eye exams is based on your age, health, family medical history, use of contact lenses, and other factors. Follow your caregiver's recommendations for frequency of eye exams.  Eat a healthy diet. Foods like vegetables, fruits, whole grains, low-fat dairy products, and lean protein foods contain the nutrients you need without too many calories. Decrease your intake of foods high in solid fats, added sugars, and salt. Eat the right amount of calories for you.Get information about a proper diet from your caregiver, if necessary.  Regular physical exercise is one of the most important things you can do for your health. Most adults should get at least 150 minutes of moderate-intensity exercise (any activity that increases your heart rate and causes you to sweat) each week. In addition, most adults need muscle-strengthening exercises on 2 or more days a week.  Maintain a healthy weight. The body mass index (BMI) is a screening tool to identify possible weight problems. It provides an estimate of body fat based on height and  weight. Your caregiver can help determine your BMI, and can help you achieve or maintain a healthy weight.For adults 20 years and older:  A BMI below 18.5 is considered underweight.  A BMI of 18.5 to 24.9 is normal.  A BMI of 25 to 29.9 is considered overweight.  A BMI of 30 and above is considered obese.  Maintain normal blood lipids and cholesterol levels by exercising and minimizing your intake of saturated fat. Eat a balanced diet with plenty of fruit and vegetables. Blood tests for lipids and cholesterol should begin at age 4 and be repeated every 5 years. If your lipid or cholesterol levels are high, you are over 50, or you are at high risk for heart disease, you may need your cholesterol levels checked more frequently.Ongoing high lipid and cholesterol levels should be treated with medicines if diet and exercise are not effective.  If you smoke, find out from your caregiver how to quit. If you do not use tobacco, do not start.  If you are pregnant, do not drink alcohol. If you are breastfeeding, be very cautious about drinking alcohol. If you are not pregnant and choose to drink alcohol, do not exceed 1 drink per day. One drink is considered to be 12 ounces (355 mL) of beer, 5 ounces (148 mL) of wine, or 1.5 ounces (44 mL) of liquor.  Avoid use of street drugs. Do not share needles with anyone. Ask for help if you need support or instructions about stopping the use of drugs.  High blood pressure causes heart disease and increases the risk of  stroke. Your blood pressure should be checked at least every 1 to 2 years. Ongoing high blood pressure should be treated with medicines if weight loss and exercise are not effective.  If you are 53 to 77 years old, ask your caregiver if you should take aspirin to prevent strokes.  Diabetes screening involves taking a blood sample to check your fasting blood sugar level. This should be done once every 3 years, after age 46, if you are within normal  weight and without risk factors for diabetes. Testing should be considered at a younger age or be carried out more frequently if you are overweight and have at least 1 risk factor for diabetes.  Breast cancer screening is essential preventive care for women. You should practice "breast self-awareness." This means understanding the normal appearance and feel of your breasts and may include breast self-examination. Any changes detected, no matter how small, should be reported to a caregiver. Women in their 32s and 30s should have a clinical breast exam (CBE) by a caregiver as part of a regular health exam every 1 to 3 years. After age 72, women should have a CBE every year. Starting at age 40, women should consider having a mammography (breast X-ray test) every year. Women who have a family history of breast cancer should talk to their caregiver about genetic screening. Women at a high risk of breast cancer should talk to their caregivers about having magnetic resonance imaging (MRI) and a mammography every year.  The Pap test is a screening test for cervical cancer. A Pap test can show cell changes on the cervix that might become cervical cancer if left untreated. A Pap test is a procedure in which cells are obtained and examined from the lower end of the uterus (cervix).  Women should have a Pap test starting at age 15.  Between ages 58 and 69, Pap tests should be repeated every 2 years.  Beginning at age 55, you should have a Pap test every 3 years as long as the past 3 Pap tests have been normal.  Some women have medical problems that increase the chance of getting cervical cancer. Talk to your caregiver about these problems. It is especially important to talk to your caregiver if a new problem develops soon after your last Pap test. In these cases, your caregiver may recommend more frequent screening and Pap tests.  The above recommendations are the same for women who have or have not gotten the  vaccine for human papillomavirus (HPV).  If you had a hysterectomy for a problem that was not cancer or a condition that could lead to cancer, then you no longer need Pap tests. Even if you no longer need a Pap test, a regular exam is a good idea to make sure no other problems are starting.  If you are between ages 67 and 53, and you have had normal Pap tests going back 10 years, you no longer need Pap tests. Even if you no longer need a Pap test, a regular exam is a good idea to make sure no other problems are starting.  If you have had past treatment for cervical cancer or a condition that could lead to cancer, you need Pap tests and screening for cancer for at least 20 years after your treatment.  If Pap tests have been discontinued, risk factors (such as a new sexual partner) need to be reassessed to determine if screening should be resumed.  The HPV test is an additional  test that may be used for cervical cancer screening. The HPV test looks for the virus that can cause the cell changes on the cervix. The cells collected during the Pap test can be tested for HPV. The HPV test could be used to screen women aged 10 years and older, and should be used in women of any age who have unclear Pap test results. After the age of 108, women should have HPV testing at the same frequency as a Pap test.  Colorectal cancer can be detected and often prevented. Most routine colorectal cancer screening begins at the age of 49 and continues through age 69. However, your caregiver may recommend screening at an earlier age if you have risk factors for colon cancer. On a yearly basis, your caregiver may provide home test kits to check for hidden blood in the stool. Use of a small camera at the end of a tube, to directly examine the colon (sigmoidoscopy or colonoscopy), can detect the earliest forms of colorectal cancer. Talk to your caregiver about this at age 70, when routine screening begins. Direct examination of the  colon should be repeated every 5 to 10 years through age 62, unless early forms of pre-cancerous polyps or small growths are found.  Hepatitis C blood testing is recommended for all people born from [birth year] through 1965 and any individual with known risks for hepatitis C.  Practice safe sex. Use condoms and avoid high-risk sexual practices to reduce the spread of sexually transmitted infections (STIs). STIs include gonorrhea, chlamydia, syphilis, trichomonas, herpes, HPV, and human immunodeficiency virus (HIV). Herpes, HIV, and HPV are viral illnesses that have no cure. They can result in disability, cancer, and death. Sexually active women aged 66 and younger should be checked for chlamydia. Older women with new or multiple partners should also be tested for chlamydia. Testing for other STIs is recommended if you are sexually active and at increased risk.  Osteoporosis is a disease in which the bones lose minerals and strength with aging. This can result in serious bone fractures. The risk of osteoporosis can be identified using a bone density scan. Women ages 77 and over and women at risk for fractures or osteoporosis should discuss screening with their caregivers. Ask your caregiver whether you should take a calcium supplement or vitamin D to reduce the rate of osteoporosis.  Menopause can be associated with physical symptoms and risks. Hormone replacement therapy is available to decrease symptoms and risks. You should talk to your caregiver about whether hormone replacement therapy is right for you.  Use sunscreen with sun protection factor (SPF) of 30 or more. Apply sunscreen liberally and repeatedly throughout the day. You should seek shade when your shadow is shorter than you. Protect yourself by wearing long sleeves, pants, a wide-brimmed hat, and sunglasses year round, whenever you are outdoors.  Once a month, do a whole body skin exam, using a mirror to look at the skin on your back. Notify your  caregiver of new moles, moles that have irregular borders, moles that are larger than a pencil eraser, or moles that have changed in shape or color.  Stay current with required immunizations.  Influenza. You need a dose every fall (or winter). The composition of the flu vaccine changes each year, so being vaccinated once is not enough.  Pneumococcal polysaccharide. You need 1 to 2 doses if you smoke cigarettes or if you have certain chronic medical conditions. You need 1 dose at age 23 (or older) if you  have never been vaccinated.  Tetanus, diphtheria, pertussis (Tdap, Td). Get 1 dose of Tdap vaccine if you are younger than age 63, are over 62 and have contact with an infant, are a Research scientist (physical sciences), are pregnant, or simply want to be protected from whooping cough. After that, you need a Td booster dose every 10 years. Consult your caregiver if you have not had at least 3 tetanus and diphtheria-containing shots sometime in your life or have a deep or dirty wound.  HPV. You need this vaccine if you are a woman age 35 or younger. The vaccine is given in 3 doses over 6 months.  Measles, mumps, rubella (MMR). You need at least 1 dose of MMR if you were born in 1957 or later. You may also need a second dose.  Meningococcal. If you are age 68 to 62 and a first-year college student living in a residence hall, or have one of several medical conditions, you need to get vaccinated against meningococcal disease. You may also need additional booster doses.  Zoster (shingles). If you are age 25 or older, you should get this vaccine.  Varicella (chickenpox). If you have never had chickenpox or you were vaccinated but received only 1 dose, talk to your caregiver to find out if you need this vaccine.  Hepatitis A. You need this vaccine if you have a specific risk factor for hepatitis A virus infection or you simply wish to be protected from this disease. The vaccine is usually given as 2 doses, 6 to 18 months  apart.  Hepatitis B. You need this vaccine if you have a specific risk factor for hepatitis B virus infection or you simply wish to be protected from this disease. The vaccine is given in 3 doses, usually over 6 months. Preventive Services / Frequency Ages 62 to 68  Blood pressure check.** / Every 1 to 2 years.  Lipid and cholesterol check.** / Every 5 years beginning at age 73.  Clinical breast exam.** / Every 3 years for women in their 44s and 30s.  Pap test.** / Every 2 years from ages 12 through 35. Every 3 years starting at age 69 through age 68 or 52 with a history of 3 consecutive normal Pap tests.  HPV screening.** / Every 3 years from ages 30 through ages 59 to 68 with a history of 3 consecutive normal Pap tests.  Hepatitis C blood test.** / For any individual with known risks for hepatitis C.  Skin self-exam. / Monthly.  Influenza immunization.** / Every year.  Pneumococcal polysaccharide immunization.** / 1 to 2 doses if you smoke cigarettes or if you have certain chronic medical conditions.  Tetanus, diphtheria, pertussis (Tdap, Td) immunization. / A one-time dose of Tdap vaccine. After that, you need a Td booster dose every 10 years.  HPV immunization. / 3 doses over 6 months, if you are 78 and younger.  Measles, mumps, rubella (MMR) immunization. / You need at least 1 dose of MMR if you were born in 1957 or later. You may also need a second dose.  Meningococcal immunization. / 1 dose if you are age 68 to 23 and a first-year college student living in a residence hall, or have one of several medical conditions, you need to get vaccinated against meningococcal disease. You may also need additional booster doses.  Varicella immunization.** / Consult your caregiver.  Hepatitis A immunization.** / Consult your caregiver. 2 doses, 6 to 18 months apart.  Hepatitis B immunization.** / Consult your  caregiver. 3 doses usually over 6 months. Ages 76 to 6  Blood pressure  check.** / Every 1 to 2 years.  Lipid and cholesterol check.** / Every 5 years beginning at age 32.  Clinical breast exam.** / Every year after age 52.  Mammogram.** / Every year beginning at age 32 and continuing for as long as you are in good health. Consult with your caregiver.  Pap test.** / Every 3 years starting at age 95 through age 30 or 42 with a history of 3 consecutive normal Pap tests.  HPV screening.** / Every 3 years from ages 53 through ages 64 to 75 with a history of 3 consecutive normal Pap tests.  Fecal occult blood test (FOBT) of stool. / Every year beginning at age 64 and continuing until age 75. You may not need to do this test if you get a colonoscopy every 10 years.  Flexible sigmoidoscopy or colonoscopy.** / Every 5 years for a flexible sigmoidoscopy or every 10 years for a colonoscopy beginning at age 4 and continuing until age 47.  Hepatitis C blood test.** / For all people born from [birth year] through 1963 and any individual with known risks for hepatitis C.  Skin self-exam. / Monthly.  Influenza immunization.** / Every year.  Pneumococcal polysaccharide immunization.** / 1 to 2 doses if you smoke cigarettes or if you have certain chronic medical conditions.  Tetanus, diphtheria, pertussis (Tdap, Td) immunization.** / A one-time dose of Tdap vaccine. After that, you need a Td booster dose every 10 years.  Measles, mumps, rubella (MMR) immunization. / You need at least 1 dose of MMR if you were born in 1957 or later. You may also need a second dose.  Varicella immunization.** / Consult your caregiver.  Meningococcal immunization.** / Consult your caregiver.  Hepatitis A immunization.** / Consult your caregiver. 2 doses, 6 to 18 months apart.  Hepatitis B immunization.** / Consult your caregiver. 3 doses, usually over 6 months. Ages 25 and over  Blood pressure check.** / Every 1 to 2 years.  Lipid and cholesterol check.** / Every 5 years beginning at age  81.  Clinical breast exam.** / Every year after age 81.  Mammogram.** / Every year beginning at age 18 and continuing for as long as you are in good health. Consult with your caregiver.  Pap test.** / Every 3 years starting at age 106 through age 20 or 2 with a 3 consecutive normal Pap tests. Testing can be stopped between 65 and 70 with 3 consecutive normal Pap tests and no abnormal Pap or HPV tests in the past 10 years.  HPV screening.** / Every 3 years from ages 29 through ages 17 or 71 with a history of 3 consecutive normal Pap tests. Testing can be stopped between 65 and 70 with 3 consecutive normal Pap tests and no abnormal Pap or HPV tests in the past 10 years.  Fecal occult blood test (FOBT) of stool. / Every year beginning at age 77 and continuing until age 38. You may not need to do this test if you get a colonoscopy every 10 years.  Flexible sigmoidoscopy or colonoscopy.** / Every 5 years for a flexible sigmoidoscopy or every 10 years for a colonoscopy beginning at age 74 and continuing until age 76.  Hepatitis C blood test.** / For all people born from [birth year] through 1963 and any individual with known risks for hepatitis C.  Osteoporosis screening.** / A one-time screening for women ages 28 and over and women  at risk for fractures or osteoporosis.  Skin self-exam. / Monthly.  Influenza immunization.** / Every year.  Pneumococcal polysaccharide immunization.** / 1 dose at age 23 (or older) if you have never been vaccinated.  Tetanus, diphtheria, pertussis (Tdap, Td) immunization. / A one-time dose of Tdap vaccine if you are over 65 and have contact with an infant, are a Research scientist (physical sciences), or simply want to be protected from whooping cough. After that, you need a Td booster dose every 10 years.  Varicella immunization.** / Consult your caregiver.  Meningococcal immunization.** / Consult your caregiver.  Hepatitis A immunization.** / Consult your caregiver. 2 doses, 6 to 18  months apart.  Hepatitis B immunization.** / Check with your caregiver. 3 doses, usually over 6 months. ** Family history and personal history of risk and conditions may change your caregiver's recommendations. Document Released: 09/15/2001 Document Revised: 10/12/2011 Document Reviewed: 12/15/2010 [facility] Patient Information 2013 [place], [place].

## 2012-11-18 ENCOUNTER — Other Ambulatory Visit: Payer: Self-pay | Admitting: Internal Medicine

## 2012-11-21 ENCOUNTER — Other Ambulatory Visit: Payer: Self-pay | Admitting: Internal Medicine

## 2013-03-31 ENCOUNTER — Other Ambulatory Visit: Payer: Self-pay

## 2013-03-31 ENCOUNTER — Other Ambulatory Visit: Payer: Self-pay | Admitting: Internal Medicine

## 2013-03-31 DIAGNOSIS — Z9012 Acquired absence of left breast and nipple: Secondary | ICD-10-CM

## 2013-03-31 DIAGNOSIS — Z1231 Encounter for screening mammogram for malignant neoplasm of breast: Secondary | ICD-10-CM

## 2013-03-31 DIAGNOSIS — Z853 Personal history of malignant neoplasm of breast: Secondary | ICD-10-CM

## 2013-04-21 ENCOUNTER — Ambulatory Visit
Admission: RE | Admit: 2013-04-21 | Discharge: 2013-04-21 | Disposition: A | Discharge status: Home / Self Care | Payer: Medicare Other | Source: Ambulatory Visit

## 2013-04-21 DIAGNOSIS — Z9012 Acquired absence of left breast and nipple: Secondary | ICD-10-CM

## 2013-04-21 DIAGNOSIS — Z853 Personal history of malignant neoplasm of breast: Secondary | ICD-10-CM

## 2013-04-21 DIAGNOSIS — Z1231 Encounter for screening mammogram for malignant neoplasm of breast: Secondary | ICD-10-CM

## 2013-04-27 ENCOUNTER — Ambulatory Visit: Payer: Medicare Other | Admitting: Internal Medicine

## 2013-05-02 ENCOUNTER — Encounter: Payer: Self-pay | Admitting: Internal Medicine

## 2013-05-02 ENCOUNTER — Ambulatory Visit (INDEPENDENT_AMBULATORY_CARE_PROVIDER_SITE_OTHER): Payer: Medicare Other | Admitting: Internal Medicine

## 2013-05-02 VITALS — BP 116/72 | HR 76 | Temp 97.7°F | Wt 125.0 lb

## 2013-05-02 DIAGNOSIS — I10 Essential (primary) hypertension: Secondary | ICD-10-CM

## 2013-05-02 DIAGNOSIS — R7309 Other abnormal glucose: Secondary | ICD-10-CM

## 2013-05-02 DIAGNOSIS — M1990 Unspecified osteoarthritis, unspecified site: Secondary | ICD-10-CM

## 2013-05-02 DIAGNOSIS — Z23 Encounter for immunization: Secondary | ICD-10-CM

## 2013-05-02 DIAGNOSIS — Z853 Personal history of malignant neoplasm of breast: Secondary | ICD-10-CM

## 2013-05-02 LAB — GLUCOSE, POCT (MANUAL RESULT ENTRY): POC Glucose: 117 mg/dl — AB (ref 70–99)

## 2013-05-02 NOTE — Patient Instructions (Addendum)
Continue lifestyle intervention healthy eating and exercise . You are doing well.  Fall avoidance . Check into   Bone density   dexa scan.   contact us when we can schedule this .  Continue calcium vit d .   On the meantime.

## 2013-05-02 NOTE — Progress Notes (Signed)
Chief Complaint  Patient presents with  . Follow-up  . Hypertension    HPI: Patient comes in today for follow up of  multiple medical problems.  Is well  Doing most of the work at [employer] and caretaking BP no se of med s LIPIDS no se of meds  BG has gained 3 pounds   Trying to stay healthy  Husband ortho. mobility and eye problems  .  ROS: See pertinent positives and negatives per HPI. No falls    Past Medical History  Diagnosis Date  . Breast cancer 1978    left breast  . Osteoarthritis   . Urinary incontinence   . Hyperlipidemia   . Hypertension   . Cystitis, interstitial   . Adenomatous colon polyp      4 2008  . Fracture     left ankle and rt wrist  . FRACTURE, ANKLE, LEFT 12/14/2006    Qualifier: Diagnosis of  By: Lawernce Ion, CMA (AAMA), Bethann Berkshire   . FRACTURE, WRIST 12/14/2006    Qualifier: Diagnosis of  By: Lawernce Ion, CMA (AAMA), Bethann Berkshire     Family History  Problem Relation Age of Onset  . Suicidality Mother   . Heart attack Father   . Cancer - Other Daughter     throat cancer     History   Social History  . Marital Status: Married    Spouse Name: N/A    Number of Children: N/A  . Years of Education: N/A   Social History Main Topics  . Smoking status: Former Games developer  . Smokeless tobacco: None  . Alcohol Use: Yes  . Drug Use: No  . Sexual Activity: None   Other Topics Concern  . None   Social History Narrative   Retired   Married   HH of 2   Husband with medical problems   g5 p3   Ex smoker  Quit 33 years ago  1952- 1968             Outpatient Encounter Prescriptions as of 05/02/2013  Medication Sig Dispense Refill  . amLODipine (NORVASC) 5 MG tablet TAKE ONE TABLET BY MOUTH EVERY DAY  90 tablet  3  . Ascorbic Acid (VITAMIN C) 500 MG tablet Take 500 mg by mouth daily. 1 bid      . calcium carbonate 1250 MG capsule Take 1,250 mg by mouth 2 (two) times daily with a meal.        . Cinnamon 500 MG capsule Take 500 mg by mouth daily. 1 bid      .  fish oil-omega-3 fatty acids 1000 MG capsule Take 2 g by mouth daily.        . magnesium 30 MG tablet Take 30 mg by mouth 2 (two) times daily.        . Multiple Vitamins-Minerals (CENTRUM SILVER PO) Take by mouth.        . oxybutynin (DITROPAN) 5 MG tablet Take 10 mg by mouth 2 (two) times daily.      . simvastatin (ZOCOR) 20 MG tablet TAKE ONE TABLET BY MOUTH EVERY DAY  90 tablet  3  . VITAMIN D, CHOLECALCIFEROL, PO Take by mouth.         No facility-administered encounter medications on file as of 05/02/2013.    EXAM:  BP 116/72  Pulse 76  Temp(Src) 97.7 F (36.5 C) (Oral)  Wt 125 lb (56.7 kg)  BMI 22.15 kg/m2  SpO2 97%  Body mass index is 22.15 kg/(m^2).  GENERAL: vitals reviewed and listed above, alert, oriented, appears well hydrated and in no acute distress HEENT: atraumatic, conjunctiva  clear, NECK: no obvious masses on inspection palpation  LUNGS: clear to auscultation bilaterally, no wheezes, rales or rhonchi, good air movement mild kyphosis  CV: HRRR, no clubbing cyanosis or  peripheral edema nl cap refill  Abdomen:  Sof,t normal bowel sounds without hepatosplenomegaly, no guarding rebound or masses no CVA tenderness MS: moves all extremities without noticeable focal  abnormality Oriented x 3 and no noted deficits in memory, attention, and speech.  PSYCH: pleasant and cooperative, no obvious depression or anxiety Lab Results  Component Value Date   WBC 4.9 10/25/2012   HGB 14.2 10/25/2012   HCT 42.3 10/25/2012   PLT 167.0 10/25/2012   GLUCOSE 97 10/25/2012   CHOL 154 10/25/2012   TRIG 109.0 10/25/2012   HDL 53.10 10/25/2012   LDLDIRECT 165.0 12/07/2006   LDLCALC 79 10/25/2012   ALT 27 10/25/2012   AST 25 10/25/2012   NA 140 10/25/2012   K 4.2 10/25/2012   CL 103 10/25/2012   CREATININE 0.7 10/25/2012   BUN 18 10/25/2012   CO2 28 10/25/2012   TSH 1.70 10/25/2012   HGBA1C 5.9 10/25/2012   bg normal PP  Today 117  ASSESSMENT AND PLAN:  Discussed the following assessment and  plan:  HYPERTENSION  OSTEOARTHRITIS  FASTING HYPERGLYCEMIA - Plan: POC Glucose (CBG)  BREAST CANCER, HX OF  Need for prophylactic vaccination and inoculation against influenza - Plan: Flu Vaccine QUAD 36+ mos PF IM (Fluarix) Disc about dexa risk benefit cautious with meds cause of  Hx of breast cancer and hx of ankle and wrist fx in the past . \pt will check with insurance first and contact us .  Or readdress at her FU pv in 6 months  Fall prevention discussed  -Patient advised to return or notify health care team  if symptoms worsen or persist or new concerns arise.  Patient Instructions  Continue lifestyle intervention healthy eating and exercise . You are doing well.  Fall avoidance . Check into   Bone density   dexa scan.   contact us when we can schedule this .  Continue calcium vit d .   On the meantime.    Neta Mends. Mariapaula Krist M.D.  Total visit > 50% spent counseling and coordinating care

## 2013-05-03 ENCOUNTER — Encounter: Payer: Self-pay | Admitting: Internal Medicine

## 2013-10-24 ENCOUNTER — Encounter: Payer: Medicare Other | Admitting: Internal Medicine

## 2013-10-27 ENCOUNTER — Encounter: Payer: Self-pay | Admitting: Internal Medicine

## 2013-10-27 ENCOUNTER — Ambulatory Visit (INDEPENDENT_AMBULATORY_CARE_PROVIDER_SITE_OTHER): Payer: Medicare Other | Admitting: Internal Medicine

## 2013-10-27 VITALS — BP 130/80 | HR 79 | Temp 98.4°F | Ht 63.5 in | Wt 122.0 lb

## 2013-10-27 DIAGNOSIS — H9190 Unspecified hearing loss, unspecified ear: Secondary | ICD-10-CM

## 2013-10-27 DIAGNOSIS — M810 Age-related osteoporosis without current pathological fracture: Secondary | ICD-10-CM | POA: Insufficient documentation

## 2013-10-27 DIAGNOSIS — Z853 Personal history of malignant neoplasm of breast: Secondary | ICD-10-CM

## 2013-10-27 DIAGNOSIS — I10 Essential (primary) hypertension: Secondary | ICD-10-CM

## 2013-10-27 DIAGNOSIS — Z23 Encounter for immunization: Secondary | ICD-10-CM

## 2013-10-27 DIAGNOSIS — K429 Umbilical hernia without obstruction or gangrene: Secondary | ICD-10-CM

## 2013-10-27 DIAGNOSIS — R7309 Other abnormal glucose: Secondary | ICD-10-CM

## 2013-10-27 DIAGNOSIS — M1990 Unspecified osteoarthritis, unspecified site: Secondary | ICD-10-CM

## 2013-10-27 DIAGNOSIS — E785 Hyperlipidemia, unspecified: Secondary | ICD-10-CM

## 2013-10-27 DIAGNOSIS — Z0000 Encounter for general adult medical examination without abnormal findings: Secondary | ICD-10-CM

## 2013-10-27 HISTORY — DX: Age-related osteoporosis without current pathological fracture: M81.0

## 2013-10-27 HISTORY — DX: Umbilical hernia without obstruction or gangrene: K42.9

## 2013-10-27 HISTORY — DX: Unspecified hearing loss, unspecified ear: H91.90

## 2013-10-27 LAB — CBC WITH DIFFERENTIAL/PLATELET
Basophils Absolute: 0 10*3/uL (ref 0.0–0.1)
Basophils Relative: 0.7 % (ref 0.0–3.0)
EOS ABS: 0.2 10*3/uL (ref 0.0–0.7)
Eosinophils Relative: 3.7 % (ref 0.0–5.0)
HCT: 42.6 % (ref 36.0–46.0)
Hemoglobin: 14.3 g/dL (ref 12.0–15.0)
LYMPHS PCT: 24.7 % (ref 12.0–46.0)
Lymphs Abs: 1.3 10*3/uL (ref 0.7–4.0)
MCHC: 33.5 g/dL (ref 30.0–36.0)
MCV: 94.5 fl (ref 78.0–100.0)
MONO ABS: 0.5 10*3/uL (ref 0.1–1.0)
Monocytes Relative: 9.4 % (ref 3.0–12.0)
NEUTROS PCT: 61.5 % (ref 43.0–77.0)
Neutro Abs: 3.3 10*3/uL (ref 1.4–7.7)
Platelets: 156 10*3/uL (ref 150.0–400.0)
RBC: 4.51 Mil/uL (ref 3.87–5.11)
RDW: 12.6 % (ref 11.5–14.6)
WBC: 5.4 10*3/uL (ref 4.5–10.5)

## 2013-10-27 LAB — LIPID PANEL
CHOLESTEROL: 169 mg/dL (ref 0–200)
HDL: 58.6 mg/dL (ref >39.00)
LDL Cholesterol: 95 mg/dL (ref 0–99)
Total CHOL/HDL Ratio: 3
Triglycerides: 76 mg/dL (ref 0.0–149.0)
VLDL: 15.2 mg/dL (ref 0.0–40.0)

## 2013-10-27 LAB — BASIC METABOLIC PANEL
BUN: 21 mg/dL (ref 6–23)
CALCIUM: 9.6 mg/dL (ref 8.4–10.5)
CO2: 28 meq/L (ref 19–32)
CREATININE: 0.9 mg/dL (ref 0.4–1.2)
Chloride: 107 mEq/L (ref 96–112)
GFR: 65.7 mL/min (ref >60.00)
Glucose, Bld: 105 mg/dL — ABNORMAL HIGH (ref 70–99)
Potassium: 5.2 mEq/L — ABNORMAL HIGH (ref 3.5–5.1)
Sodium: 144 mEq/L (ref 135–145)

## 2013-10-27 LAB — TSH: TSH: 1.29 u[IU]/mL (ref 0.35–5.50)

## 2013-10-27 LAB — HEPATIC FUNCTION PANEL
ALT: 26 U/L (ref 0–35)
AST: 25 U/L (ref 0–37)
Albumin: 4.6 g/dL (ref 3.5–5.2)
Alkaline Phosphatase: 65 U/L (ref 39–117)
BILIRUBIN TOTAL: 0.7 mg/dL (ref 0.3–1.2)
Bilirubin, Direct: 0 mg/dL (ref 0.0–0.3)
Total Protein: 7.7 g/dL (ref 6.0–8.3)

## 2013-10-27 LAB — HEMOGLOBIN A1C: Hgb A1c MFr Bld: 6.1 % (ref 4.6–6.5)

## 2013-10-27 NOTE — Patient Instructions (Addendum)
consider seeing audiologist about the hearing.  Contact us if we need to help.  Will notify you  of labs when available. You have a vbery small umbilical hernia  If causing problem  We can get a surgery consults seek care if any pain.

## 2013-10-27 NOTE — Progress Notes (Signed)
Chief Complaint  Patient presents with  . Medicare Wellness    HPI: Patient comes in today for Preventive Medicare wellness visit .  No major injuries, ed visits ,hospitalizations , new medications since last visit. BP meds ok  LIPIDS ok  concern bouty sugar weight pretty stable very active  No falling  Didn't want to get dexa  Cause ;doesnt want to take fosamax type medications  Hearing :  Getting worse runs in familyu doin ok but hard to hear  With background  Vision ok  Health Maintenance  Topic Date Due  . Zostavax  09/27/1993  . Influenza Vaccine  03/03/2014  . Tetanus/tdap  02/07/2017  . Colonoscopy  07/12/2019  . Pneumococcal Polysaccharide Vaccine Age 35 And Over  Completed   Health Maintenance Review  Hearing:   Decrease see screen   Vision:  No limitations at present . Last eye check UTD  Safety:  Has smoke detector and wears seat belts.  No firearms. No excess sun exposure. Sees dentist regularly.  Falls:  No   Advance directive :  Reviewed  Has one.  Memory: Felt to be good  , no concern from her or her family.  Depression: No anhedonia unusual crying or depressive symptoms  Nutrition: Eats well balanced diet; adequate calcium and vitamin D. No swallowing chewing problems.  Injury: no major injuries in the last six months.  Other healthcare providers:  Reviewed today .  Social:  Lives with spouse married. No pets. But takes acare of outdoor animals.   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 She is independent.  EXERCISE/ HABITS  Per week  Active yard etc  No tobacco    etoh     ROS:  GEN/ HEENT: No fever, significant weight changes sweats headaches vision problems  CV/ PULM; No chest pain shortness of breath cough, syncope,edema  change in exercise  tolerance. GI /GU: No adominal pain, vomiting, change in bowel habits. No blood in the stool. No significant GU symptoms. SKIN/HEME: ,no acute skin rashes suspicious lesions or bleeding. No lymphadenopathy, nodules, masses.  NEURO/ PSYCH:  No neurologic signs such as weakness numbness. No depression anxiety. IMM/ Allergy: No unusual infections.  Allergy .   REST of 12 system review negative except as per HPI   Past Medical History  Diagnosis Date  . Breast cancer 1978    left breast  . Osteoarthritis   . Urinary incontinence   . Hyperlipidemia   . Hypertension   . Cystitis, interstitial   . Adenomatous colon polyp      4 2008  . Fracture     left ankle and rt wrist  . FRACTURE, ANKLE, LEFT 12/14/2006    Qualifier: Diagnosis of  By: Hulan Saas, CMA (AAMA), Quita Skye   . FRACTURE, WRIST 12/14/2006    Qualifier: Diagnosis of  By: Hulan Saas, CMA (AAMA), Quita Skye     Family History  Problem Relation Age of Onset  . Suicidality Mother   . Heart attack Father   . Cancer - Other Daughter     throat cancer     History   Social History  . Marital Status: Married    Spouse Name: N/A    Number of Children: N/A  . Years of Education: N/A   Social History Main Topics  .  Smoking status: Former Research scientist (life sciences)  . Smokeless tobacco: None  . Alcohol Use: Yes  . Drug Use: No  . Sexual Activity: None   Other Topics Concern  . None   Social History Narrative   Retired   Married   [place] of 2   Husband with medical problems   g5 p3   Ex smoker  Quit 33 years ago  [place]             Outpatient Encounter Prescriptions as of 10/27/2013  Medication Sig  . amLODipine (NORVASC) 5 MG tablet TAKE ONE TABLET BY MOUTH EVERY DAY  . Ascorbic Acid (VITAMIN C) 500 MG tablet Take 500 mg by mouth daily. 1 bid  . calcium carbonate 1250 MG capsule Take 1,250 mg by mouth 2 (two) times daily with a meal.    . Cinnamon 500 MG capsule Take 500 mg by mouth daily. 1 bid  . fish oil-omega-3 fatty acids  1000 MG capsule Take 2 g by mouth daily.    . magnesium 30 MG tablet Take 30 mg by mouth 2 (two) times daily.    . Multiple Vitamins-Minerals (CENTRUM SILVER PO) Take by mouth.    . oxybutynin (DITROPAN) 5 MG tablet Take 10 mg by mouth 2 (two) times daily.  . simvastatin (ZOCOR) 20 MG tablet TAKE ONE TABLET BY MOUTH EVERY DAY  . VITAMIN D, CHOLECALCIFEROL, PO Take by mouth.      EXAM:  BP 130/80  Pulse 79  Temp(Src) 98.4 F (36.9 C) (Oral)  Ht 5' 3.5" (1.613 m)  Wt 122 lb (55.339 kg)  BMI 21.27 kg/m2  SpO2 98%  Body mass index is 21.27 kg/(m^2).  Physical Exam: Vital signs reviewed NID:POEU is a well-developed well-nourished alert cooperative   who appears stated age in no acute distress. Glasses  HEENT: normocephalic atraumatic , Eyes: PERRL EOM's full, conjunctiva clear, Nares: paten,t no deformity discharge or tenderness., Ears: no deformity EAC's clear TMs with normal landmarks. Mouth: clear OP, no lesions, edema.  Moist mucous membranes. Dentition in adequate repair. NECK: supple without masses, thyromegaly or bruits. CHEST/PULM:  Clear to auscultation and percussion breath sounds equal no wheeze , rales or rhonchi. No chest wall deformities or tenderness. CV: PMI is nondisplaced, S1 S2 no gallops, murmurs, rubs. Peripheral pulses are full without delay.No JVD .  Breast: right  normal by inspection . No dimpling, discharge, masses, tenderness or discharge . Left surgically absent ABDOMEN: Bowel sounds normal nontender  No guard or rebound, no hepato splenomegal no CVA tenderness.  Small ? umbi hernia reducible vs prominent umbi no pain  Extremtities:  No clubbing cyanosis or edema, no acute joint swelling or redness no focal atrophy OA change   NEURO:  Oriented x3, cranial nerves 3-12 appear to be intact, no obvious focal weakness,gait within normal limits no abnormal reflexes or asymmetrical SKIN: No acute rashes normal turgor, color, no bruising or petechiae. Sun changes   PSYCH: Oriented, good eye contact, no obvious depression anxiety, cognition and judgment appear normal. LN: no cervical axillary inguinal adenopathy No noted deficits in memory, attention, and speech.  ASSESSMENT AND PLAN:  Discussed the following assessment and plan:  Visit for preventive health examination - prevnar  will chekc into shingles vaccine reimbursment not covered in past - Plan: Basic metabolic panel, CBC with Differential, Hemoglobin A1c, Hepatic function panel, Lipid panel, TSH  Unspecified essential hypertension - controlled  - Plan: Basic metabolic panel, CBC with Differential, Hemoglobin A1c, Hepatic function panel, Lipid  panel, TSH  Other and unspecified hyperlipidemia - med check - Plan: Basic metabolic panel, CBC with Differential, Hemoglobin A1c, Hepatic function panel, Lipid panel, TSH  Other abnormal glucose - hx elevaeted lsi at this time doing well  - Plan: Basic metabolic panel, CBC with Differential, Hemoglobin A1c, Hepatic function panel, Lipid panel, TSH  Osteoarthrosis, unspecified whether generalized or localized, unspecified site - Plan: Basic metabolic panel, CBC with Differential, Hemoglobin A1c, Hepatic function panel, Lipid panel, TSH  Personal history of malignant neoplasm of breast - Plan: Basic metabolic panel, CBC with Differential, Hemoglobin A1c, Hepatic function panel, Lipid panel, TSH  Need for vaccination with 13-polyvalent pneumococcal conjugate vaccine - Plan: Pneumococcal conjugate vaccine 13-valent  Decreased hearing - more problematic  see evaluation   Osteoporosis, unspecified ? hx of fracrture no =dexa done  - no dexa dclines at this time hx of fracture  taking vit d check level today tonsure adequate - Plan: Vit D  25 hydroxy (rtn osteoporosis monitoring)  Umbilical hernia ? small - no sx  disc options observe for now if any sx or swelling get evaluated by surgery   Patient Care Team: Burnis Medin, MD as PCP - General Franchot Gallo, MD (Urology)  Patient Instructions  consider seeing audiologist about the hearing.  Contact us if we need to help.  Will notify you  of labs when available. You have a vbery small umbilical hernia  If causing problem  We can get a surgery consults seek care if any pain.     [name] [name]. [name] [name] M.D.  Pre visit review using our clinic review tool, if applicable. No additional management support is needed unless otherwise documented below in the visit note.

## 2013-10-28 ENCOUNTER — Telehealth: Payer: Self-pay | Admitting: Internal Medicine

## 2013-10-28 LAB — VITAMIN D 25 HYDROXY (VIT D DEFICIENCY, FRACTURES): Vit D, 25-Hydroxy: 83 ng/mL (ref 30–89)

## 2013-10-28 NOTE — Telephone Encounter (Signed)
Relevant patient education mailed to patient.  

## 2013-11-28 ENCOUNTER — Other Ambulatory Visit: Payer: Self-pay | Admitting: Internal Medicine

## 2014-05-01 ENCOUNTER — Ambulatory Visit (INDEPENDENT_AMBULATORY_CARE_PROVIDER_SITE_OTHER): Payer: Medicare Other | Admitting: Internal Medicine

## 2014-05-01 ENCOUNTER — Encounter: Payer: Self-pay | Admitting: Internal Medicine

## 2014-05-01 VITALS — BP 138/74 | Temp 98.2°F | Ht 63.5 in | Wt 124.6 lb

## 2014-05-01 DIAGNOSIS — Z23 Encounter for immunization: Secondary | ICD-10-CM

## 2014-05-01 DIAGNOSIS — E785 Hyperlipidemia, unspecified: Secondary | ICD-10-CM

## 2014-05-01 DIAGNOSIS — Z853 Personal history of malignant neoplasm of breast: Secondary | ICD-10-CM

## 2014-05-01 DIAGNOSIS — I10 Essential (primary) hypertension: Secondary | ICD-10-CM

## 2014-05-01 DIAGNOSIS — H9193 Unspecified hearing loss, bilateral: Secondary | ICD-10-CM

## 2014-05-01 DIAGNOSIS — N3010 Interstitial cystitis (chronic) without hematuria: Secondary | ICD-10-CM

## 2014-05-01 DIAGNOSIS — R7309 Other abnormal glucose: Secondary | ICD-10-CM

## 2014-05-01 DIAGNOSIS — H9190 Unspecified hearing loss, unspecified ear: Secondary | ICD-10-CM

## 2014-05-01 DIAGNOSIS — M810 Age-related osteoporosis without current pathological fracture: Secondary | ICD-10-CM

## 2014-05-01 LAB — GLUCOSE, POCT (MANUAL RESULT ENTRY): POC Glucose: 96 mg/dl (ref 70–99)

## 2014-05-01 NOTE — Progress Notes (Signed)
Pre visit review using our clinic review tool, if applicable. No additional management support is needed unless otherwise documented below in the visit note.   Chief Complaint  Patient presents with  . Follow-up    meds  bf    HPI: [name] [name] comes in for fu of medical issues LIPIDS taking medication doing the best she can with diet  Exercise very active  BG  Pre diabetic hyperglycemia no sig weight changes  Hx of breast cancer :  37 years    Out of mastectomy.   rx for bra ? Need for mammo on a reg basis no lumps GU: IC and frequency taking ditropan only prn not doesn't see shy going back to uri if no help  No change at this time  Osteoporosis  :  currently declines meds as she feels well   ROS: See pertinent positives and negatives per HPI.no cp sob syncope bleeding  Past Medical History  Diagnosis Date  . Breast cancer 1978    left breast  . Osteoarthritis   . Urinary incontinence   . Hyperlipidemia   . Hypertension   . Cystitis, interstitial   . Adenomatous colon polyp      4 2008  . Fracture     left ankle and rt wrist  . FRACTURE, ANKLE, LEFT 12/14/2006    Qualifier: Diagnosis of  By: Hulan Saas, CMA (AAMA), Quita Skye   . FRACTURE, WRIST 12/14/2006    Qualifier: Diagnosis of  By: Hulan Saas, CMA (AAMA), Quita Skye     Family History  Problem Relation Age of Onset  . Suicidality Mother   . Heart attack Father   . Cancer - Other Daughter     throat cancer     History   Social History  . Marital Status: Married    Spouse Name: N/A    Number of Children: N/A  . Years of Education: N/A   Social History Main Topics  . Smoking status: Former Research scientist (life sciences)  . Smokeless tobacco: None  . Alcohol Use: Yes  . Drug Use: No  . Sexual Activity: None   Other Topics Concern  . None   Social History Narrative   Retired   Married   [place] of 2   Husband with medical problems   g5 p3   Ex smoker  Quit 33 years ago  [place]    Outpatient Encounter Prescriptions as of  05/01/2014  Medication Sig  . amLODipine (NORVASC) 5 MG tablet TAKE ONE TABLET BY MOUTH EVERY DAY.  Marland Kitchen Ascorbic Acid (VITAMIN C) 500 MG tablet Take 500 mg by mouth daily. 1 bid  . Calcium Carbonate-Vitamin D (CALCIUM-D PO) Take 2 tablets by mouth daily.  Marland Kitchen CINNAMON PO Take 2,000 mg by mouth daily.  . fish oil-omega-3 fatty acids 1000 MG capsule Take 2 g by mouth daily.    . Magnesium 400 MG TABS Take 2 tablets by mouth daily.  . Multiple Vitamins-Minerals (CENTRUM SILVER PO) Take by mouth.    . oxybutynin (DITROPAN) 5 MG tablet Take 10 mg by mouth 2 (two) times daily.  . simvastatin (ZOCOR) 20 MG tablet TAKE ONE TABLET BY MOUTH EVERY DAY.  Marland Kitchen Vitamin D, Cholecalciferol, 1000 UNITS TABS Take 2 tablets by mouth daily.  . [DISCONTINUED] calcium carbonate 1250 MG capsule Take 1,250 mg by mouth 2 (two) times daily with a meal.    . [DISCONTINUED] Cinnamon 500 MG capsule Take 500 mg by mouth daily. 1 bid  . [DISCONTINUED] magnesium  30 MG tablet Take 30 mg by mouth 2 (two) times daily.    . [DISCONTINUED] VITAMIN D, CHOLECALCIFEROL, PO Take by mouth.      EXAM:  BP 138/74  Temp(Src) 98.2 F (36.8 C) (Oral)  Ht 5' 3.5" (1.613 m)  Wt 124 lb 9.6 oz (56.518 kg)  BMI 21.72 kg/m2  Body mass index is 21.72 kg/(m^2).  GENERAL: vitals reviewed and listed above, alert, oriented, appears well hydrated and in no acute distress HEENT: atraumatic, conjunctiva  clear, no obvious abnormalities on inspection of external nose and ears OP : no lesion edema or exudate glasses  NECK: no obvious masses on inspection palpation  LUNGS: clear to auscultation bilaterally, no wheezes, rales or rhonchi, good air movement CV: HRRR, no clubbing cyanosis or  peripheral edema nl cap refill  Abdomen:  Sof,t normal bowel sounds without hepatosplenomegaly, no guarding rebound or masses no CVA tenderness Small outtie  No tenderness mass MS: moves all extremities without noticeable focal  Abnormality some oa changes  PSYCH:  pleasant and cooperative, no obvious depression or anxiety Excellent balance  Lab Results  Component Value Date   WBC 5.4 10/27/2013   HGB 14.3 10/27/2013   HCT 42.6 10/27/2013   PLT 156.0 10/27/2013   GLUCOSE 105* 10/27/2013   CHOL 169 10/27/2013   TRIG 76.0 10/27/2013   HDL 58.60 10/27/2013   LDLDIRECT 165.0 12/07/2006   LDLCALC 95 10/27/2013   ALT 26 10/27/2013   AST 25 10/27/2013   NA 144 10/27/2013   K 5.2* 10/27/2013   CL 107 10/27/2013   CREATININE 0.9 10/27/2013   BUN 21 10/27/2013   CO2 28 10/27/2013   TSH 1.29 10/27/2013   HGBA1C 6.1 10/27/2013    ASSESSMENT AND PLAN:  Discussed the following assessment and plan:  Other abnormal glucose - Plan: POC Glucose (CBG)  Unspecified essential hypertension - ok at home   Other and unspecified hyperlipidemia  Osteoporosis, unspecified - get dexa  pt hesitant to take meds   Decreased hearing, bilateral  Need for prophylactic vaccination and inoculation against influenza - Plan: Flu vaccine HIGH DOSE PF (Fluzone Tri High dose)  INTERSTITIAL CYSTITIS  BREAST CANCER, HX OF - rx written  bra disc +- of mammos  Mastectomy bra rx written pt not hot on yearly mammo at this time  Feel lto that hight risk  -Patient advised to return or notify health care team  if symptoms worsen ,persist or new concerns arise.  Patient Instructions  Monitor bp readings  About a few times per months.  No change in medicine at this time.  reconsiders  Osteoporosis  Medication.  Fosamax or prolia  Type medications.  Ok to   Get  rx  For new bra .  Stay active  But avoid injury      Standley Brooking. [name] M.D. Total visit 84mins > 50% spent counseling and coordinating care see above

## 2014-05-01 NOTE — Patient Instructions (Addendum)
Monitor bp readings  About a few times per months.  No change in medicine at this time.  reconsiders  Osteoporosis  Medication.  Fosamax or prolia  Type medications.  Ok to   Get  rx  For new bra .  Stay active  But avoid injury

## 2014-05-02 ENCOUNTER — Encounter: Payer: Self-pay | Admitting: Gastroenterology

## 2014-05-02 ENCOUNTER — Encounter: Payer: Self-pay | Admitting: Internal Medicine

## 2014-06-27 ENCOUNTER — Encounter: Payer: Self-pay | Admitting: Podiatry

## 2014-06-27 ENCOUNTER — Ambulatory Visit (INDEPENDENT_AMBULATORY_CARE_PROVIDER_SITE_OTHER): Payer: Medicare Other | Admitting: Podiatry

## 2014-06-27 ENCOUNTER — Ambulatory Visit (INDEPENDENT_AMBULATORY_CARE_PROVIDER_SITE_OTHER): Payer: Medicare Other

## 2014-06-27 VITALS — BP 127/75 | HR 65 | Resp 13 | Ht 63.0 in | Wt 122.0 lb

## 2014-06-27 DIAGNOSIS — M7661 Achilles tendinitis, right leg: Secondary | ICD-10-CM

## 2014-06-27 DIAGNOSIS — M79604 Pain in right leg: Secondary | ICD-10-CM

## 2014-06-27 MED ORDER — TRIAMCINOLONE ACETONIDE 10 MG/ML IJ SUSP
10.0000 mg | Freq: Once | INTRAMUSCULAR | Status: AC
  Administered 2014-06-27: 10 mg

## 2014-06-27 NOTE — Progress Notes (Signed)
Subjective:     Patient ID: [name] [name], female   DOB: 01-20-1932, 80 y.o.   MRN: [id number]  HPI patient is found to have pain in the posterior aspect of the right heel medial side it's been present for about 3 months. States she's tried medicine and ice without relief    Review of Systems  All other systems reviewed and are negative.      Objective:   Physical Exam  Constitutional: She is oriented to person, place, and time.  Cardiovascular: Intact distal pulses.   Musculoskeletal: Normal range of motion.  Neurological: She is oriented to person, place, and time.  Skin: Skin is warm.  Nursing note and vitals reviewed.  neurovascular status found to be intact muscle strength was adequate in range of motion within normal limits. Patient has discomfort in the posterior medial aspect of the right heel at the Achilles insertion right with inflammation and fluid buildup in does appear to have normal Achilles tendon strength and motion. Digits are well-perfused and she's well oriented 3     Assessment:     Appears to be Achilles tendinitis medial side that's quite inflamed with pressure    Plan:     H&P and x-rays reviewed. I've recommended careful injection explaining the chances for rupture associated with it and she wants to pursue this option. I carefully injected the medial side stating out of the Achilles tendon with 3 mg dexamethasone Kenalog and advised on reduced activity for the next week. Reappoint to recheck in 2 weeks or as needed

## 2014-06-27 NOTE — Progress Notes (Signed)
   Subjective:    Patient ID: [name] [name], female    DOB: 16-Sep-1931, 80 y.o.   MRN: [id number]  HPI Comments: Pt states she has some pain in the right posterior heel for 2 - 3 months.     Review of Systems  All other systems reviewed and are negative.      Objective:   Physical Exam        Assessment & Plan:

## 2014-11-05 ENCOUNTER — Ambulatory Visit (INDEPENDENT_AMBULATORY_CARE_PROVIDER_SITE_OTHER): Payer: Medicare Other | Admitting: Internal Medicine

## 2014-11-05 ENCOUNTER — Encounter: Payer: Self-pay | Admitting: Internal Medicine

## 2014-11-05 VITALS — BP 140/88 | Temp 97.8°F | Ht 63.0 in | Wt 125.9 lb

## 2014-11-05 DIAGNOSIS — R7301 Impaired fasting glucose: Secondary | ICD-10-CM

## 2014-11-05 DIAGNOSIS — I10 Essential (primary) hypertension: Secondary | ICD-10-CM

## 2014-11-05 DIAGNOSIS — E785 Hyperlipidemia, unspecified: Secondary | ICD-10-CM

## 2014-11-05 DIAGNOSIS — H9193 Unspecified hearing loss, bilateral: Secondary | ICD-10-CM | POA: Diagnosis not present

## 2014-11-05 DIAGNOSIS — Z853 Personal history of malignant neoplasm of breast: Secondary | ICD-10-CM | POA: Diagnosis not present

## 2014-11-05 DIAGNOSIS — Z0000 Encounter for general adult medical examination without abnormal findings: Secondary | ICD-10-CM

## 2014-11-05 LAB — LIPID PANEL
CHOL/HDL RATIO: 3
Cholesterol: 161 mg/dL (ref 0–200)
HDL: 57.1 mg/dL (ref >39.00)
LDL CALC: 89 mg/dL (ref 0–99)
NONHDL: 103.9
TRIGLYCERIDES: 75 mg/dL (ref 0.0–149.0)
VLDL: 15 mg/dL (ref 0.0–40.0)

## 2014-11-05 LAB — CBC WITH DIFFERENTIAL/PLATELET
Basophils Absolute: 0 10*3/uL (ref 0.0–0.1)
Basophils Relative: 0.7 % (ref 0.0–3.0)
EOS ABS: 0.1 10*3/uL (ref 0.0–0.7)
Eosinophils Relative: 1.6 % (ref 0.0–5.0)
HCT: 41.8 % (ref 36.0–46.0)
Hemoglobin: 14.2 g/dL (ref 12.0–15.0)
Lymphocytes Relative: 25.1 % (ref 12.0–46.0)
Lymphs Abs: 1.2 10*3/uL (ref 0.7–4.0)
MCHC: 34 g/dL (ref 30.0–36.0)
MCV: 92.7 fl (ref 78.0–100.0)
MONO ABS: 0.4 10*3/uL (ref 0.1–1.0)
MONOS PCT: 8.6 % (ref 3.0–12.0)
NEUTROS ABS: 2.9 10*3/uL (ref 1.4–7.7)
Neutrophils Relative %: 64 % (ref 43.0–77.0)
Platelets: 143 10*3/uL — ABNORMAL LOW (ref 150.0–400.0)
RBC: 4.5 Mil/uL (ref 3.87–5.11)
RDW: 12.9 % (ref 11.5–15.5)
WBC: 4.6 10*3/uL (ref 4.0–10.5)

## 2014-11-05 LAB — HEPATIC FUNCTION PANEL
ALBUMIN: 4.5 g/dL (ref 3.5–5.2)
ALK PHOS: 60 U/L (ref 39–117)
ALT: 23 U/L (ref 0–35)
AST: 26 U/L (ref 0–37)
BILIRUBIN TOTAL: 0.8 mg/dL (ref 0.2–1.2)
Bilirubin, Direct: 0.2 mg/dL (ref 0.0–0.3)
Total Protein: 7.3 g/dL (ref 6.0–8.3)

## 2014-11-05 LAB — BASIC METABOLIC PANEL
BUN: 22 mg/dL (ref 6–23)
CALCIUM: 9.8 mg/dL (ref 8.4–10.5)
CHLORIDE: 108 meq/L (ref 96–112)
CO2: 29 mEq/L (ref 19–32)
CREATININE: 0.77 mg/dL (ref 0.40–1.20)
GFR: 76.45 mL/min (ref >60.00)
Glucose, Bld: 103 mg/dL — ABNORMAL HIGH (ref 70–99)
POTASSIUM: 4.8 meq/L (ref 3.5–5.1)
SODIUM: 142 meq/L (ref 135–145)

## 2014-11-05 LAB — TSH: TSH: 1.97 u[IU]/mL (ref 0.35–4.50)

## 2014-11-05 LAB — HEMOGLOBIN A1C: HEMOGLOBIN A1C: 6.1 % (ref 4.6–6.5)

## 2014-11-05 NOTE — Progress Notes (Signed)
Pre visit review using our clinic review tool, if applicable. No additional management support is needed unless otherwise documented below in the visit note.  Chief Complaint  Patient presents with  . Medicare Wellness    IC frequency o going hx breast cancer   . Hypertension  . Hyperlipidemia    HPI: [name] [name] 82 y.o. comes in today for Preventive Medicare wellness visit . And Chronic disease management... Feet are  Peeling  And dry.  Uses moisturizer saw pod did cortisone shot for food issue  chamomile  Tea to help sleep   no sx of osa.  Nocturia no change   No bladder mesd hx of IC  Not taking med at this time  BP no se of med active ok  Still limiting sugars  Does the yard work etc husband has health issues No major injuries, ed visits ,hospitalizations , new medications since last visit.  Health Maintenance  Topic Date Due  . ZOSTAVAX  09/27/1993  . DEXA SCAN  09/27/1998  . INFLUENZA VACCINE  03/04/2015  . TETANUS/TDAP  02/07/2017  . COLONOSCOPY  07/12/2019  . PNA vac Low Risk Adult  Completed   Health Maintenance Review LIFESTYLE:  Exercise:   Walking  Tobacco/ETS: no no Alcohol: no Sugar beverages: no Sleep: not much 5 - 6  Drug use: no Bone density: see l ast  Osteoporosis  Colonoscopy: utd  Mammogram  Not  MEDICARE DOCUMENT QUESTIONS  TO SCAN   Hearing:  No change  Dec left more than right   Vision:  No limitations at present . Last eye check UTD  Safety:  Has smoke detector and wears seat belts.  No firearms. No excess sun exposure. Sees dentist regularly.  Falls:  no  Advance directive :  Reviewed  Has one.  Memory: Felt to be good  , no concern from her or her family.  Depression: No anhedonia unusual crying or depressive symptoms  Nutrition: Eats well balanced diet; adequate calcium and vitamin D. No swallowing chewing problems.  Injury: no major injuries in the last six months.  Other healthcare providers:  Reviewed today  .  Social:  Lives with spouse married. No pets.   Preventive parameters: up-to-date  Reviewed   ADLS:   There are no problems or need for assistance  driving, feeding, obtaining food, dressing, toileting and bathing, managing money using phone. She is independent.  Husband has  Health problems  She does yard work      ROS:  GEN/ HEENT: No fever, significant weight changes sweats headaches vision problems hearing changes, CV/ PULM; No chest pain shortness of breath cough, syncope,edema  change in exercise tolerance. GI /GU: No adominal pain, vomiting, change in bowel habits. No blood in the stool. contniued IC symptoms  Med didn't help  SKIN/HEME: ,no acute skin rashes suspicious lesions or bleeding. No lymphadenopathy, nodules, masses.  NEURO/ PSYCH:  No neurologic signs such as weakness numbness. No depression anxiety. IMM/ Allergy: No unusual infections.  Allergy .   REST of 12 system review negative except as per HPI   Past Medical History  Diagnosis Date  . Breast cancer 1978    left breast  . Osteoarthritis   . Urinary incontinence   . Hyperlipidemia   . Hypertension   . Cystitis, interstitial   . Adenomatous colon polyp      4 2008  . Fracture     left ankle and rt wrist  . FRACTURE, ANKLE, LEFT 12/14/2006  Qualifier: Diagnosis of  By: Hulan Saas, CMA (AAMA), [place] Skye   . FRACTURE, WRIST 12/14/2006    Qualifier: Diagnosis of  By: Hulan Saas, CMA (AAMA), Q[place]a Skye     Family History  Problem Relation Age of Onset  . Suicidality Mother   . Heart attack Father   . Cancer - Other Daughter     throat cancer     History   Social History  . Marital Status: Married    Spouse Name: N/A  . Number of Children: N/A  . Years of Education: N/A   Social History Main Topics  . Smoking status: Former Research scientist (life sciences)  . Smokeless tobacco: Not on file  . Alcohol Use: Yes  . Drug Use: No  . Sexual Activity: Not on file   Other Topics Concern  . None   Social History Narrative    Retired   Married   [place] of [place]   Husband with medical problems   g5 p3   Ex smoker  Quit 33 years ago  [place]    Outpatient Encounter Prescriptions as of 11/05/2014  Medication Sig  . amLODipine (NORVASC) 5 MG tablet TAKE ONE TABLET BY MOUTH EVERY DAY.  Marland Kitchen Ascorbic Acid (VITAMIN C) 500 MG tablet Take 500 mg by mouth daily. 1 bid  . Calcium Carbonate-Vitamin D (CALCIUM-D PO) Take 2 tablets by mouth daily.  Marland Kitchen CINNAMON PO Take 2,000 mg by mouth daily.  . fish oil-omega-3 fatty acids 1000 MG capsule Take 2 g by mouth daily.    . Magnesium 400 MG TABS Take 2 tablets by mouth daily.  . Multiple Vitamins-Minerals (CENTRUM SILVER PO) Take by mouth.    . simvastatin (ZOCOR) 20 MG tablet TAKE ONE TABLET BY MOUTH EVERY DAY.  Marland Kitchen Vitamin D, Cholecalciferol, 1000 UNITS TABS Take 2 tablets by mouth daily.  . [DISCONTINUED] oxybutynin (DITROPAN) 5 MG tablet Take 10 mg by mouth 2 (two) times daily.    EXAM:  BP 140/88 mmHg  Temp(Src) 97.8 F (36.6 C) (Oral)  Ht 5\' 3"  (1.6 m)  Wt 125 lb 14.4 oz (57.108 kg)  BMI 22.31 kg/m2  Body mass index is 22.31 kg/(m^2).  Physical Exam: Vital signs reviewed JSE:GBTD is a well-developed well-nourished alert cooperative   who appears stated age in no acute distress.  hard of hearing nl cognition HEENT: normocephalic atraumatic , Eyes: PER EOM's full, conjunctiva clear,glasses Nares: paten,t no deformity discharge or tenderness., Ears: no deformity EAC's clear TMs with normal landmarks. Mouth: clear OP, no lesions, edema.  Moist mucous membranes. Dentition in adequate repair. NECK: supple without masses, thyromegaly or bruits. CHEST/PULM:  Clear to auscultation and percussion breath sounds equal no wheeze , rales or rhonchi. No chest wall deformities or tenderness. CV: PMI is nondisplaced, S1 S2 no gallops, murmurs, rubs. Peripheral pulses are full without delay.No JVD .  Breast  Left absent  Right   No masses axilla clear .  ABDOMEN: Bowel sounds normal  nontender  No guard or rebound, no hepato splenomegal no CVA tenderness.   Extremtities:  No clubbing cyanosis or edema, no acute joint swelling or redness no focal atrophy mild oa changes NEURO:  Oriented x3, cranial nerves 3-12 appear to be intact, no obvious focal weakness,gait within normal limits no abnormal reflexes or asymmetrical SKIN: No acute rashes normal turgor, color, no bruising or petechiae. Peeling feet no rash  PSYCH: Oriented, good eye contact, no obvious depression anxiety, cognition and judgment appear normal. LN: no cervical axillary inguinal adenopathy No  noted deficits in memory, attention, and speech.    BP Readings from Last 3 Encounters:  11/05/14 140/88  06/27/14 127/75  05/01/14 138/74   Wt Readings from Last 3 Encounters:  11/05/14 125 lb 14.4 oz (57.108 kg)  06/27/14 122 lb (55.339 kg)  05/01/14 124 lb 9.6 oz (56.518 kg)     ASSESSMENT AND PLAN:  Discussed the following assessment and plan:  Visit for preventive health examination  Medicare annual wellness visit, subsequent  Essential hypertension - mpostly controlled  - Plan: Basic metabolic panel, CBC with Differential/Platelet, Hemoglobin A1c, Hepatic function panel, Lipid panel, TSH  Hyperlipidemia - check labs today  - Plan: Basic metabolic panel, CBC with Differential/Platelet, Hemoglobin A1c, Hepatic function panel, Lipid panel, TSH  Decreased hearing, bilateral - Plan: Basic metabolic panel, CBC with Differential/Platelet, Hemoglobin A1c, Hepatic function panel, Lipid panel, TSH  BREAST CANCER, HX OF - over 20 years out  doing well  - Plan: Basic metabolic panel, CBC with Differential/Platelet, Hemoglobin A1c, Hepatic function panel, Lipid panel, TSH  Fasting hyperglycemia - Plan: Basic metabolic panel, CBC with Differential/Platelet, Hemoglobin A1c, Hepatic function panel, Lipid panel, TSH Continue meds as directed  Labs today  Patient Care Team: Burnis Medin, MD as PCP -  General Franchot Gallo, MD (Urology)  Patient Instructions   Continue lifestyle intervention healthy eating and exercise .  But dont lose  weight Will notify you  of labs when available. Yearly flu vaccine        Why follow it? Research shows. . Those who follow the Mediterranean diet have a reduced risk of heart disease  . The diet is associated with a reduced incidence of Parkinson's and Alzheimer's diseases . People following the diet may have longer life expectancies and lower rates of chronic diseases  . The Dietary Guidelines for Americans recommends the Mediterranean diet as an eating plan to promote health and prevent disease  What Is the Mediterranean Diet?  . Healthy eating plan based on typical foods and recipes of Mediterranean-style cooking . The diet is primarily a plant based diet; these foods should make up a majority of meals   Starches - Plant based foods should make up a majority of meals - They are an important sources of vitamins, minerals, energy, antioxidants, and fiber - Choose whole grains, foods high in fiber and minimally processed items  - Typical grain sources include wheat, oats, barley, corn, brown rice, bulgar, farro, millet, polenta, couscous  - Various types of beans include chickpeas, lentils, fava beans, black beans, white beans   Fruits  Veggies - Large quantities of antioxidant rich fruits & veggies; 6 or more servings  - Vegetables can be eaten raw or lightly drizzled with oil and cooked  - Vegetables common to the traditional Mediterranean Diet include: artichokes, arugula, beets, broccoli, brussel sprouts, cabbage, carrots, celery, collard greens, cucumbers, eggplant, kale, leeks, lemons, lettuce, mushrooms, okra, onions, peas, peppers, potatoes, pumpkin, radishes, rutabaga, shallots, spinach, sweet potatoes, turnips, zucchini - Fruits common to the Mediterranean Diet include: apples, apricots, avocados, cherries, clementines, dates, figs,  grapefruits, grapes, melons, nectarines, oranges, peaches, pears, pomegranates, strawberries, tangerines  Fats - Replace butter and margarine with healthy oils, such as olive oil, canola oil, and tahini  - Limit nuts to no more than a handful a day  - Nuts include walnuts, almonds, pecans, pistachios, pine nuts  - Limit or avoid candied, honey roasted or heavily salted nuts - Olives are central to the Mediterranean diet - can be  eaten whole or used in a variety of dishes   Meats Protein - Limiting red meat: no more than a few times a month - When eating red meat: choose lean cuts and keep the portion to the size of deck of cards - Eggs: approx. 0 to 4 times a week  - Fish and lean poultry: at least 2 a week  - Healthy protein sources include, chicken, [place], lean beef, lamb - Increase intake of seafood such as tuna, salmon, trout, mackerel, shrimp, scallops - Avoid or limit high fat processed meats such as sausage and bacon  Dairy - Include moderate amounts of low fat dairy products  - Focus on healthy dairy such as fat free yogurt, skim milk, low or reduced fat cheese - Limit dairy products higher in fat such as whole or 2% milk, cheese, ice cream  Alcohol - Moderate amounts of red wine is ok  - No more than 5 oz daily for women (all ages) and men older than age 31  - No more than 10 oz of wine daily for men younger than 48  Other - Limit sweets and other desserts  - Use herbs and spices instead of salt to flavor foods  - Herbs and spices common to the traditional Mediterranean Diet include: basil, bay leaves, chives, cloves, cumin, fennel, garlic, lavender, marjoram, mint, oregano, parsley, pepper, rosemary, sage, savory, sumac, tarragon, thyme   It's not just a diet, it's a lifestyle:  . The Mediterranean diet includes lifestyle factors typical of those in the region  . Foods, drinks and meals are best eaten with others and savored . Daily physical activity is important for overall good  health . This could be strenuous exercise like running and aerobics . This could also be more leisurely activities such as walking, housework, yard-work, or taking the stairs . Moderation is the key; a balanced and healthy diet accommodates most foods and drinks . Consider portion sizes and frequency of consumption of certain foods   Meal Ideas & Options:  . Breakfast:  o Whole wheat toast or whole wheat English muffins with peanut butter & hard boiled egg o Steel cut oats topped with apples & cinnamon and skim milk  o Fresh fruit: banana, strawberries, melon, berries, peaches  o Smoothies: strawberries, bananas, greek yogurt, peanut butter o Low fat greek yogurt with blueberries and granola  o Egg white omelet with spinach and mushrooms o Breakfast couscous: whole wheat couscous, apricots, skim milk, cranberries  . Sandwiches:  o Hummus and grilled vegetables (peppers, zucchini, squash) on whole wheat bread   o Grilled chicken on whole wheat pita with lettuce, tomatoes, cucumbers or tzatziki  o Tuna salad on whole wheat bread: tuna salad made with greek yogurt, olives, red peppers, capers, green onions o Garlic rosemary lamb pita: lamb sauted with garlic, rosemary, salt & pepper; add lettuce, cucumber, greek yogurt to pita - flavor with lemon juice and black pepper  . Seafood:  o Mediterranean grilled salmon, seasoned with garlic, basil, parsley, lemon juice and black pepper o Shrimp, lemon, and spinach whole-grain pasta salad made with low fat greek yogurt  o Seared scallops with lemon orzo  o Seared tuna steaks seasoned salt, pepper, coriander topped with tomato mixture of olives, tomatoes, olive oil, minced garlic, parsley, green onions and cappers  . Meats:  o Herbed greek chicken salad with kalamata olives, cucumber, feta  o Red bell peppers stuffed with spinach, bulgur, lean ground beef (or lentils) & topped with  feta   o Kebabs: skewers of chicken, tomatoes, onions, zucchini,  squash  o [place] burgers: made with red onions, mint, dill, lemon juice, feta cheese topped with roasted red peppers . Vegetarian o Cucumber salad: cucumbers, artichoke hearts, celery, red onion, feta cheese, tossed in olive oil & lemon juice  o Hummus and whole grain pita points with a greek salad (lettuce, tomato, feta, olives, cucumbers, red onion) o Lentil soup with celery, carrots made with vegetable broth, garlic, salt and pepper  o Tabouli salad: parsley, bulgur, mint, scallions, cucumbers, tomato, radishes, lemon juice, olive oil, salt and pepper.         Standley Brooking. Oliveah Zwack M.D.   Lab Results  Component Value Date   WBC 4.6 11/05/2014   HGB 14.2 11/05/2014   HCT 41.8 11/05/2014   PLT 143.0* 11/05/2014   GLUCOSE 103* 11/05/2014   CHOL 161 11/05/2014   TRIG 75.0 11/05/2014   HDL 57.10 11/05/2014   LDLDIRECT 165.0 12/07/2006   LDLCALC 89 11/05/2014   ALT 23 11/05/2014   AST 26 11/05/2014   NA 142 11/05/2014   K 4.8 11/05/2014   CL 108 11/05/2014   CREATININE 0.77 11/05/2014   BUN 22 11/05/2014   CO2 29 11/05/2014   TSH 1.97 11/05/2014   HGBA1C 6.1 11/05/2014

## 2014-11-05 NOTE — Patient Instructions (Addendum)
Continue lifestyle intervention healthy eating and exercise .  But dont lose  weight Will notify you  of labs when available. Yearly flu vaccine        Why follow it? Research shows. . Those who follow the Mediterranean diet have a reduced risk of heart disease  . The diet is associated with a reduced incidence of Parkinson's and Alzheimer's diseases . People following the diet may have longer life expectancies and lower rates of chronic diseases  . The Dietary Guidelines for Americans recommends the Mediterranean diet as an eating plan to promote health and prevent disease  What Is the Mediterranean Diet?  . Healthy eating plan based on typical foods and recipes of Mediterranean-style cooking . The diet is primarily a plant based diet; these foods should make up a majority of meals   Starches - Plant based foods should make up a majority of meals - They are an important sources of vitamins, minerals, energy, antioxidants, and fiber - Choose whole grains, foods high in fiber and minimally processed items  - Typical grain sources include wheat, oats, barley, corn, brown rice, bulgar, farro, millet, polenta, couscous  - Various types of beans include chickpeas, lentils, fava beans, black beans, white beans   Fruits  Veggies - Large quantities of antioxidant rich fruits & veggies; 6 or more servings  - Vegetables can be eaten raw or lightly drizzled with oil and cooked  - Vegetables common to the traditional Mediterranean Diet include: artichokes, arugula, beets, broccoli, brussel sprouts, cabbage, carrots, celery, collard greens, cucumbers, eggplant, kale, leeks, lemons, lettuce, mushrooms, okra, onions, peas, peppers, potatoes, pumpkin, radishes, rutabaga, shallots, spinach, sweet potatoes, turnips, zucchini - Fruits common to the Mediterranean Diet include: apples, apricots, avocados, cherries, clementines, dates, figs, grapefruits, grapes, melons, nectarines, oranges, peaches, pears,  pomegranates, strawberries, tangerines  Fats - Replace butter and margarine with healthy oils, such as olive oil, canola oil, and tahini  - Limit nuts to no more than a handful a day  - Nuts include walnuts, almonds, pecans, pistachios, pine nuts  - Limit or avoid candied, honey roasted or heavily salted nuts - Olives are central to the [employer] - can be eaten whole or used in a variety of dishes   Meats Protein - Limiting red meat: no more than a few times a month - When eating red meat: choose lean cuts and keep the portion to the size of deck of cards - Eggs: approx. 0 to 4 times a week  - Fish and lean poultry: at least 2 a week  - Healthy protein sources include, chicken, [place], lean beef, lamb - Increase intake of seafood such as tuna, salmon, trout, mackerel, shrimp, scallops - Avoid or limit high fat processed meats such as sausage and bacon  Dairy - Include moderate amounts of low fat dairy products  - Focus on healthy dairy such as fat free yogurt, skim milk, low or reduced fat cheese - Limit dairy products higher in fat such as whole or 2% milk, cheese, ice cream  Alcohol - Moderate amounts of red wine is ok  - No more than 5 oz daily for women (all ages) and men older than age 91  - No more than 10 oz of wine daily for men younger than 64  Other - Limit sweets and other desserts  - Use herbs and spices instead of salt to flavor foods  - Herbs and spices common to the traditional Mediterranean Diet include: basil, bay leaves, chives,  cloves, cumin, fennel, garlic, lavender, marjoram, mint, oregano, parsley, pepper, rosemary, sage, savory, sumac, tarragon, thyme   It's not just a diet, it's a lifestyle:  . The Mediterranean diet includes lifestyle factors typical of those in the region  . Foods, drinks and meals are best eaten with others and savored . Daily physical activity is important for overall good health . This could be strenuous exercise like running and  aerobics . This could also be more leisurely activities such as walking, housework, yard-work, or taking the stairs . Moderation is the key; a balanced and healthy diet accommodates most foods and drinks . Consider portion sizes and frequency of consumption of certain foods   Meal Ideas & Options:  . Breakfast:  o Whole wheat toast or whole wheat English muffins with peanut butter & hard boiled egg o Steel cut oats topped with apples & cinnamon and skim milk  o Fresh fruit: banana, strawberries, melon, berries, peaches  o Smoothies: strawberries, bananas, greek yogurt, peanut butter o Low fat greek yogurt with blueberries and granola  o Egg white omelet with spinach and mushrooms o Breakfast couscous: whole wheat couscous, apricots, skim milk, cranberries  . Sandwiches:  o Hummus and grilled vegetables (peppers, zucchini, squash) on whole wheat bread   o Grilled chicken on whole wheat pita with lettuce, tomatoes, cucumbers or tzatziki  o Tuna salad on whole wheat bread: tuna salad made with greek yogurt, olives, red peppers, capers, green onions o Garlic rosemary lamb pita: lamb sauted with garlic, rosemary, salt & pepper; add lettuce, cucumber, greek yogurt to pita - flavor with lemon juice and black pepper  . Seafood:  o Mediterranean grilled salmon, seasoned with garlic, basil, parsley, lemon juice and black pepper o Shrimp, lemon, and spinach whole-grain pasta salad made with low fat greek yogurt  o Seared scallops with lemon orzo  o Seared tuna steaks seasoned salt, pepper, coriander topped with tomato mixture of olives, tomatoes, olive oil, minced garlic, parsley, green onions and cappers  . Meats:  o Herbed greek chicken salad with kalamata olives, cucumber, feta  o Red bell peppers stuffed with spinach, bulgur, lean ground beef (or lentils) & topped with feta   o Kebabs: skewers of chicken, tomatoes, onions, zucchini, squash  o [place] burgers: made with red onions, mint, dill,  lemon juice, feta cheese topped with roasted red peppers . Vegetarian o Cucumber salad: cucumbers, artichoke hearts, celery, red onion, feta cheese, tossed in olive oil & lemon juice  o Hummus and whole grain pita points with a greek salad (lettuce, tomato, feta, olives, cucumbers, red onion) o Lentil soup with celery, carrots made with vegetable broth, garlic, salt and pepper  o Tabouli salad: parsley, bulgur, mint, scallions, cucumbers, tomato, radishes, lemon juice, olive oil, salt and pepper.

## 2014-11-13 ENCOUNTER — Telehealth: Payer: Self-pay | Admitting: Internal Medicine

## 2014-11-13 NOTE — Telephone Encounter (Signed)
Pt wants to know if she should return in 6 months or one yr? It was based on lab results and pt forgot to ask.

## 2014-11-13 NOTE — Telephone Encounter (Signed)
6 months

## 2014-11-13 NOTE — Telephone Encounter (Signed)
Pt has been scheduled.  °

## 2014-12-12 ENCOUNTER — Encounter: Payer: Self-pay | Admitting: Gastroenterology

## 2014-12-12 ENCOUNTER — Other Ambulatory Visit: Payer: Self-pay | Admitting: Internal Medicine

## 2014-12-12 NOTE — Telephone Encounter (Signed)
Sent to the pharmacy by e-scribe. 

## 2015-05-14 ENCOUNTER — Encounter: Payer: Self-pay | Admitting: Internal Medicine

## 2015-05-14 ENCOUNTER — Ambulatory Visit (INDEPENDENT_AMBULATORY_CARE_PROVIDER_SITE_OTHER): Payer: Medicare Other | Admitting: Internal Medicine

## 2015-05-14 VITALS — BP 126/76 | Temp 98.3°F | Ht 62.5 in | Wt 126.2 lb

## 2015-05-14 DIAGNOSIS — Z23 Encounter for immunization: Secondary | ICD-10-CM | POA: Diagnosis not present

## 2015-05-14 DIAGNOSIS — I10 Essential (primary) hypertension: Secondary | ICD-10-CM | POA: Diagnosis not present

## 2015-05-14 DIAGNOSIS — R7301 Impaired fasting glucose: Secondary | ICD-10-CM | POA: Diagnosis not present

## 2015-05-14 DIAGNOSIS — H9193 Unspecified hearing loss, bilateral: Secondary | ICD-10-CM

## 2015-05-14 DIAGNOSIS — E785 Hyperlipidemia, unspecified: Secondary | ICD-10-CM

## 2015-05-14 DIAGNOSIS — M129 Arthropathy, unspecified: Secondary | ICD-10-CM

## 2015-05-14 DIAGNOSIS — Z853 Personal history of malignant neoplasm of breast: Secondary | ICD-10-CM

## 2015-05-14 DIAGNOSIS — M1711 Unilateral primary osteoarthritis, right knee: Secondary | ICD-10-CM

## 2015-05-14 LAB — POCT CBG (FASTING - GLUCOSE)-MANUAL ENTRY: Glucose Fasting, POC: 99 mg/dL (ref 70–99)

## 2015-05-14 NOTE — Patient Instructions (Addendum)
Continue  Exercises for knee   as per ortho. Think you are doing well.  Pay attention to avoid injury . Continue  healthy eating and exercise .  Plan labs  And check up in 6 months  To include hga1c.  Or as needed

## 2015-05-14 NOTE — Progress Notes (Signed)
Pre visit review using our clinic review tool, if applicable. No additional management support is needed unless otherwise documented below in the visit note.  Chief Complaint  Patient presents with  . Follow-up    HPI: [name] [name] 81 y.o.  comes in for chronic disease/ medication management    Ht taking med no se  No cp sob new changes  Right knee  oa ? Pulled    Exercises.   Got some electric shocks for pain . Has seen gso and advised certain exercises  Very busy no fall.  Lipid med no change  hyperglycemia trying to stay   Healthy. caretakes husband  Helps with grandkids   No fall  ROS: See pertinent positives and negatives per HPI.  Past Medical History  Diagnosis Date  . Breast cancer ([place]) 1978    left breast  . Osteoarthritis   . Urinary incontinence   . Hyperlipidemia   . Hypertension   . Cystitis, interstitial   . Adenomatous colon polyp      4 2008  . Fracture     left ankle and rt wrist  . FRACTURE, ANKLE, LEFT 12/14/2006    Qualifier: Diagnosis of  By: Hulan Saas, CMA (AAMA), [place]   . FRACTURE, WRIST 12/14/2006    Qualifier: Diagnosis of  By: Hulan Saas, CMA (AAMA), [place]e     Family History  Problem Relation Age of Onset  . Suicidality Mother   . Heart attack Father   . Cancer - Other Daughter     throat cancer     Social History   Social History  . Marital Status: Married    Spouse Name: N/A  . Number of Children: N/A  . Years of Education: N/A   Social History Main Topics  . Smoking status: Former Research scientist (life sciences)  . Smokeless tobacco: None  . Alcohol Use: Yes  . Drug Use: No  . Sexual Activity: Not Asked   Other Topics Concern  . None   Social History Narrative   Retired   Married   [place] 2   Husband with medical problems   g5 p3   Ex smoker  Quit 33 years ago  [place]    Outpatient Prescriptions Prior to Visit  Medication Sig Dispense Refill  . amLODipine (NORVASC) 5 MG tablet TAKE ONE TABLET BY MOUTH EVERY DAY. 90 tablet  1  . Ascorbic Acid (VITAMIN C) 500 MG tablet Take 500 mg by mouth daily. 1 bid    . Calcium Carbonate-Vitamin D (CALCIUM-D PO) Take 2 tablets by mouth daily.    Marland Kitchen CINNAMON PO Take 2,000 mg by mouth daily.    . fish oil-omega-3 fatty acids 1000 MG capsule Take 2 g by mouth daily.      . Magnesium 400 MG TABS Take 2 tablets by mouth daily.    . Multiple Vitamins-Minerals (CENTRUM SILVER PO) Take by mouth.      . simvastatin (ZOCOR) 20 MG tablet TAKE ONE TABLET BY MOUTH EVERY DAY. 90 tablet 1  . Vitamin D, Cholecalciferol, 1000 UNITS TABS Take 2 tablets by mouth daily.     No facility-administered medications prior to visit.     EXAM:  BP 126/76 mmHg  Temp(Src) 98.3 F (36.8 C) (Oral)  Ht 5' 2.5" (1.588 m)  Wt 126 lb 3.2 oz (57.244 kg)  BMI 22.70 kg/m2  Body mass index is 22.7 kg/(m^2).  GENERAL: vitals reviewed and listed above, alert, oriented, appears well hydrated and in no acute  distress no ntoxic looks well  HEENT: atraumatic, conjunctiva  clear, no obvious abnormalities on inspection of external nose and ears OP : no lesion edema or exudate  NECK: no obvious masses on inspection palpation  No bruits  LUNGS: clear to auscultation bilaterally, no wheezes, rales or rhonchi, good air movement scoliosis  CV: HRRR, no clubbing cyanosis or  peripheral edema nl cap refill  MS: moves all extremities without noticeable focal  Abnormality goo d rom knee no effusion  PSYCH: pleasant and cooperative, no obvious depression or anxiety slight hard of hearing  Cognition appears intact .  Lab Results  Component Value Date   WBC 4.6 11/05/2014   HGB 14.2 11/05/2014   HCT 41.8 11/05/2014   PLT 143.0* 11/05/2014   GLUCOSE 103* 11/05/2014   CHOL 161 11/05/2014   TRIG 75.0 11/05/2014   HDL 57.10 11/05/2014   LDLDIRECT 165.0 12/07/2006   LDLCALC 89 11/05/2014   ALT 23 11/05/2014   AST 26 11/05/2014   NA 142 11/05/2014   K 4.8 11/05/2014   CL 108 11/05/2014   CREATININE 0.77 11/05/2014    BUN 22 11/05/2014   CO2 29 11/05/2014   TSH 1.97 11/05/2014   HGBA1C 6.1 11/05/2014   BP Readings from Last 3 Encounters:  05/14/15 126/76  11/05/14 140/88  06/27/14 127/75   Wt Readings from Last 3 Encounters:  05/14/15 126 lb 3.2 oz (57.244 kg)  11/05/14 125 lb 14.4 oz (57.108 kg)  06/27/14 122 lb (55.339 kg)   Health Maintenance Due  Topic Date Due  . ZOSTAVAX  09/27/1993  . DEXA SCAN  09/27/1998     ASSESSMENT AND PLAN:  Discussed the following assessment and plan:  Essential hypertension  Fasting hyperglycemia - Plan: POCT CBG (Fasting - Glucose)  Hyperlipidemia  Decreased hearing, bilateral  BREAST CANCER, HX OF  Need for prophylactic vaccination and inoculation against influenza - Plan: Flu vaccine HIGH DOSE PF (Fluzone High dose)  Arthritis of right knee - djd  per ortho  rx exercising reviewed status  No change in emd  Total visit 73mins > 50% spent counseling and coordinating care as indicated in above note and in instructions to patient .   cbg pp 99 good     -Patient advised to return or notify health care team  if symptoms worsen ,persist or new concerns arise.  Patient Instructions  Continue  Exercises for knee   as per ortho. Think you are doing well.  Pay attention to avoid injury . Continue  healthy eating and exercise .  Plan labs  And check up in 6 months  To include hga1c.  Or as needed        Standley Brooking. Luella Gardenhire M.D.

## 2015-06-28 ENCOUNTER — Other Ambulatory Visit: Payer: Self-pay | Admitting: Internal Medicine

## 2015-07-01 NOTE — Telephone Encounter (Signed)
Sent to the pharmacy by e-scribe.  Pt has upcoming wellness on 11/19/15.

## 2015-09-24 ENCOUNTER — Encounter: Payer: Self-pay | Admitting: Gastroenterology

## 2015-11-15 ENCOUNTER — Ambulatory Visit: Payer: Medicare Other | Admitting: Internal Medicine

## 2015-11-18 NOTE — Progress Notes (Signed)
Pre visit review using our clinic review tool, if applicable. No additional management support is needed unless otherwise documented below in the visit note.  Chief Complaint  Patient presents with  . Medicare Wellness  . Hypertension  . Hyperlipidemia    HPI: [name] [name] 82 y.o. comes in today for Preventive Medicare wellness visit .Since last visit. Has seen ortho and pod  Shot in foot heel for pain to dhamstring and streching exercises  No injury bp and lipid med ok   Health Maintenance  Topic Date Due  . ZOSTAVAX  09/27/1993  . DEXA SCAN  09/27/1998  . INFLUENZA VACCINE  03/03/2016  . TETANUS/TDAP  02/07/2017  . PNA vac Low Risk Adult  Completed   Health Maintenance Review LIFESTYLE:  TAD no Sugar beverages: no Sleep:5-7 hours   MEDICARE DOCUMENT QUESTIONS  TO SCAN   Hearing: bad hearing    ?  Vision:  No limitations at present . Last eye check UTD  Safety:  Has smoke detector and wears seat belts.  No firearms. No excess sun exposure. Sees dentist regularly.  Falls: no  Advance directive :  Reviewed  Has one.  Memory: Felt to be good  , no concern from her or her family. ? Sometimes an issue never lost    Depression: No anhedonia unusual crying or depressive symptoms  Nutrition: Eats well balanced diet; adequate calcium and vitamin D. No swallowing chewing problems.  Injury: no major injuries in the last six months.  Other healthcare providers:  Reviewed today .  Social:  Lives with spouse married..   Preventive parameters: up-to-date  Reviewed   ADLS:   There are no problems or need for assistance  driving, feeding, obtaining food, dressing, toileting and bathing, managing money using phone. She is independent.  Does most of the yard work  Husband has issues  Stent  In heart    ROS:  GEN/ HEENT: No fever, significant weight changes sweats headaches vision problems hearing changes, CV/ PULM; No chest pain shortness of breath cough,  syncope,edema  change in exercise tolerance. GI /GU: No adominal pain, vomiting, change in bowel habits. No blood in the stool. No significant GU symptoms. SKIN/HEME: ,no acute skin rashes suspicious lesions or bleeding. No lymphadenopathy, nodules, masses.  NEURO/ PSYCH:  No neurologic signs such as weakness numbness. No depression anxiety. IMM/ Allergy: No unusual infections.  Allergy .   REST of 12 system review negative except as per HPI   Past Medical History  Diagnosis Date  . Breast cancer ([place]) 1978    left breast  . Osteoarthritis   . Urinary incontinence   . Hyperlipidemia   . Hypertension   . Cystitis, interstitial   . Adenomatous colon polyp      4 2008  . Fracture     left ankle and rt wrist  . FRACTURE, ANKLE, LEFT 12/14/2006    Qualifier: Diagnosis of  By: Hulan Saas, CMA (AAMA), [place]   . FRACTURE, WRIST 12/14/2006    Qualifier: Diagnosis of  By: Hulan Saas, CMA (AAMA), [place] Skye     Family History  Problem Relation Age of Onset  . Suicidality Mother   . Heart attack Father   . Cancer - Other Daughter     throat cancer     Social History   Social History  . Marital Status: Married    Spouse Name: N/A  . Number of Children: N/A  . Years of Education: N/A   Social History Main  Topics  . Smoking status: Former Research scientist (life sciences)  . Smokeless tobacco: None  . Alcohol Use: Yes  . Drug Use: No  . Sexual Activity: Not Asked   Other Topics Concern  . None   Social History Narrative   Retired   Married   [place] of 2   Husband with medical problems   g5 p3   Ex smoker  Quit 33 years ago  [place]    Outpatient Encounter Prescriptions as of 11/19/2015  Medication Sig  . amLODipine (NORVASC) 5 MG tablet TAKE ONE TABLET BY MOUTH ONCE DAILY  . Ascorbic Acid (VITAMIN C) 500 MG tablet Take 500 mg by mouth daily. 1 bid  . Calcium Carbonate-Vitamin D (CALCIUM-D PO) Take 2 tablets by mouth daily.  Marland Kitchen CINNAMON PO Take 2,000 mg by mouth daily.  . fish oil-omega-3 fatty  acids 1000 MG capsule Take 2 g by mouth daily.    . Magnesium 400 MG TABS Take 2 tablets by mouth daily.  . Multiple Vitamins-Minerals (CENTRUM SILVER PO) Take by mouth.    . simvastatin (ZOCOR) 20 MG tablet TAKE ONE TABLET BY MOUTH ONCE DAILY  . Vitamin D, Cholecalciferol, 1000 UNITS TABS Take 2 tablets by mouth daily.   No facility-administered encounter medications on file as of 11/19/2015.    EXAM:  BP 142/70 mmHg  Temp(Src) 98.2 F (36.8 C) (Oral)  Ht '5\' 3"'$  (1.6 m)  Wt 124 lb (56.246 kg)  BMI 21.97 kg/m2  Body mass index is 21.97 kg/(m^2).  Physical Exam: Vital signs reviewed [id number] is a well-developed well-nourished alert cooperative   who appears stated age in no acute distress.  HEENT: normocephalic atraumatic , Eyes: PERRL EOM's glasses  full, conjunctiva clear, Nares: paten,t no deformity discharge or tenderness., Ears: no deformity EAC's clear TMs with normal landmarks. Mouth: clear OP, no lesions, edema.  Moist mucous membranes. Dentition in adequate repair. NECK: supple without masses, thyromegaly or bruits. CHEST/PULM:  Clear to auscultation and percussion breath sounds equal no wheeze , rales or rhonchi. No chest wall deformities or tenderness. CV: PMI is nondisplaced, S1 S2 no gallops, murmurs, rubs. Peripheral pulses are full without delay.No JVD . Left breast absent right no mass or dc of deformity   ABDOMEN: Bowel sounds normal nontender  No guard or rebound, no hepato splenomegal no CVA tenderness.   Extremtities:  No clubbing cyanosis or edema, no acute joint swelling or redness no focal atrophy oa changes   NEURO:  Oriented x3, cranial nerves 3-12 appear to be intact, no obvious focal weakness,gait within normal limits no abnormal reflexes or asymmetrical SKIN: No acute rashes normal turgor, color, no bruising or petechiae.  Some senil ecchymosis on hand ?  djd changes  PSYCH: Oriented, good eye contact, no obvious depression anxiety, cognition and judgment  appear normal. LN: no cervical axillary inguinal adenopathy No noted deficits in memory, attention, and speech.    BP Readings from Last 3 Encounters:  11/19/15 142/70  05/14/15 126/76  11/05/14 140/88   Wt Readings from Last 3 Encounters:  11/19/15 124 lb (56.246 kg)  05/14/15 126 lb 3.2 oz (57.244 kg)  11/05/14 125 lb 14.4 oz (57.108 kg)     ASSESSMENT AND PLAN:  Discussed the following assessment and plan:  Visit for preventive health examination - Plan: Basic metabolic panel, CBC with Differential/Platelet, Hemoglobin A1c, Hepatic function panel, Lipid panel, TSH  Medication management - Plan: Basic metabolic panel, CBC with Differential/Platelet, Hemoglobin A1c, Hepatic function panel, Lipid panel, TSH  Medicare annual wellness visit, subsequent  Essential hypertension - Plan: Basic metabolic panel, CBC with Differential/Platelet, Hemoglobin A1c, Hepatic function panel, Lipid panel, TSH  Fasting hyperglycemia - Plan: Basic metabolic panel, CBC with Differential/Platelet, Hemoglobin A1c, Hepatic function panel, Lipid panel, TSH  Hyperlipidemia - Plan: Basic metabolic panel, CBC with Differential/Platelet, Hemoglobin A1c, Hepatic function panel, Lipid panel, TSH  Decreased hearing, bilateral - Plan: Basic metabolic panel, CBC with Differential/Platelet, Hemoglobin A1c, Hepatic function panel, Lipid panel, TSH  BREAST CANCER, HX OF - remote  over 25 years out no issues  disc mammogram considieration of stopping statin med cause of higher risk ( dm) that benefit ( vascular)at this time?   Since she has lost weight and doing well   Check labs    SDM can be her choice  Continue lifestyle intervention healthy eating and exercise . dsic dexa and  Consideration fracture risk pt not really interested in meds cause of poss se  Patient Care Team: Burnis Medin, MD as PCP - General Franchot Gallo, MD (Urology)  Patient Instructions   Continue lifestyle intervention healthy  eating and exercise . Limiting sweets  And ok .  consider stopping the cholesterol medication   Increase risk of diabetes in older women age groups .  Consider getting a bone density  To look at risk of  Fracture   .        Health Maintenance, Female Adopting a healthy lifestyle and getting preventive care can go a long way to promote health and wellness. Talk with your health care provider about what schedule of regular examinations is right for you. This is a good chance for you to check in with your provider about disease prevention and staying healthy. In between checkups, there are plenty of things you can do on your own. Experts have done a lot of research about which lifestyle changes and preventive measures are most likely to keep you healthy. Ask your health care provider for more information. WEIGHT AND DIET  Eat a healthy diet  Be sure to include plenty of vegetables, fruits, low-fat dairy products, and lean protein.  Do not eat a lot of foods high in solid fats, added sugars, or salt.  Get regular exercise. This is one of the most important things you can do for your health.  Most adults should exercise for at least 150 minutes each week. The exercise should increase your heart rate and make you sweat (moderate-intensity exercise).  Most adults should also do strengthening exercises at least twice a week. This is in addition to the moderate-intensity exercise.  Maintain a healthy weight  Body mass index (BMI) is a measurement that can be used to identify possible weight problems. It estimates body fat based on height and weight. Your health care provider can help determine your BMI and help you achieve or maintain a healthy weight.  For females 11 years of age and older:   A BMI below 18.5 is considered underweight.  A BMI of 18.5 to 24.9 is normal.  A BMI of 25 to 29.9 is considered overweight.  A BMI of 30 and above is considered obese.  Watch levels of  cholesterol and blood lipids  You should start having your blood tested for lipids and cholesterol at 80 years of age, then have this test every 5 years.  You may need to have your cholesterol levels checked more often if:  Your lipid or cholesterol levels are high.  You are older than 80 years  of age.  You are at high risk for heart disease.  CANCER SCREENING   Lung Cancer  Lung cancer screening is recommended for adults 2-63 years old who are at high risk for lung cancer because of a history of smoking.  A yearly low-dose CT scan of the lungs is recommended for people who:  Currently smoke.  Have quit within the past 15 years.  Have at least a 30-pack-year history of smoking. A pack year is smoking an average of one pack of cigarettes a day for 1 year.  Yearly screening should continue until it has been 15 years since you quit.  Yearly screening should stop if you develop a health problem that would prevent you from having lung cancer treatment.  Breast Cancer  Practice breast self-awareness. This means understanding how your breasts normally appear and feel.  It also means doing regular breast self-exams. Let your health care provider know about any changes, no matter how small.  If you are in your 20s or 30s, you should have a clinical breast exam (CBE) by a health care provider every 1-3 years as part of a regular health exam.  If you are 45 or older, have a CBE every year. Also consider having a breast X-ray (mammogram) every year.  If you have a family history of breast cancer, talk to your health care provider about genetic screening.  If you are at high risk for breast cancer, talk to your health care provider about having an MRI and a mammogram every year.  Breast cancer gene (BRCA) assessment is recommended for women who have family members with BRCA-related cancers. BRCA-related cancers include:  Breast.  Ovarian.  Tubal.  Peritoneal  cancers.  Results of the assessment will determine the need for genetic counseling and BRCA1 and BRCA2 testing. Cervical Cancer Your health care provider may recommend that you be screened regularly for cancer of the pelvic organs (ovaries, uterus, and vagina). This screening involves a pelvic examination, including checking for microscopic changes to the surface of your cervix (Pap test). You may be encouraged to have this screening done every 3 years, beginning at age 92.  For women ages 44-65, health care providers may recommend pelvic exams and Pap testing every 3 years, or they may recommend the Pap and pelvic exam, combined with testing for human papilloma virus (HPV), every 5 years. Some types of HPV increase your risk of cervical cancer. Testing for HPV may also be done on women of any age with unclear Pap test results.  Other health care providers may not recommend any screening for nonpregnant women who are considered low risk for pelvic cancer and who do not have symptoms. Ask your health care provider if a screening pelvic exam is right for you.  If you have had past treatment for cervical cancer or a condition that could lead to cancer, you need Pap tests and screening for cancer for at least 20 years after your treatment. If Pap tests have been discontinued, your risk factors (such as having a new sexual partner) need to be reassessed to determine if screening should resume. Some women have medical problems that increase the chance of getting cervical cancer. In these cases, your health care provider may recommend more frequent screening and Pap tests. Colorectal Cancer  This type of cancer can be detected and often prevented.  Routine colorectal cancer screening usually begins at 79 years of age and continues through 80 years of age.  Your health care provider may  recommend screening at an earlier age if you have risk factors for colon cancer.  Your health care provider may also  recommend using home test kits to check for hidden blood in the stool.  A small camera at the end of a tube can be used to examine your colon directly (sigmoidoscopy or colonoscopy). This is done to check for the earliest forms of colorectal cancer.  Routine screening usually begins at age 53.  Direct examination of the colon should be repeated every 5-10 years through 80 years of age. However, you may need to be screened more often if early forms of precancerous polyps or small growths are found. Skin Cancer  Check your skin from head to toe regularly.  Tell your health care provider about any new moles or changes in moles, especially if there is a change in a mole's shape or color.  Also tell your health care provider if you have a mole that is larger than the size of a pencil eraser.  Always use sunscreen. Apply sunscreen liberally and repeatedly throughout the day.  Protect yourself by wearing long sleeves, pants, a wide-brimmed hat, and sunglasses whenever you are outside. HEART DISEASE, DIABETES, AND HIGH BLOOD PRESSURE   High blood pressure causes heart disease and increases the risk of stroke. High blood pressure is more likely to develop in:  People who have blood pressure in the high end of the normal range (130-139/85-89 mm Hg).  People who are overweight or obese.  People who are African American.  If you are 62-66 years of age, have your blood pressure checked every 3-5 years. If you are 26 years of age or older, have your blood pressure checked every year. You should have your blood pressure measured twice--once when you are at a hospital or clinic, and once when you are not at a hospital or clinic. Record the average of the two measurements. To check your blood pressure when you are not at a hospital or clinic, you can use:  An automated blood pressure machine at a pharmacy.  A home blood pressure monitor.  If you are between 39 years and 78 years old, ask your health  care provider if you should take aspirin to prevent strokes.  Have regular diabetes screenings. This involves taking a blood sample to check your fasting blood sugar level.  If you are at a normal weight and have a low risk for diabetes, have this test once every three years after 80 years of age.  If you are overweight and have a high risk for diabetes, consider being tested at a younger age or more often. PREVENTING INFECTION  Hepatitis B  If you have a higher risk for hepatitis B, you should be screened for this virus. You are considered at high risk for hepatitis B if:  You were born in a country where hepatitis B is common. Ask your health care provider which countries are considered high risk.  Your parents were born in a high-risk country, and you have not been immunized against hepatitis B (hepatitis B vaccine).  You have HIV or AIDS.  You use needles to inject street drugs.  You live with someone who has hepatitis B.  You have had sex with someone who has hepatitis B.  You get hemodialysis treatment.  You take certain medicines for conditions, including cancer, organ transplantation, and autoimmune conditions. Hepatitis C  Blood testing is recommended for:  Everyone born from [birth year] through 1963.  Anyone with known risk  factors for hepatitis C. Sexually transmitted infections (STIs)  You should be screened for sexually transmitted infections (STIs) including gonorrhea and chlamydia if:  You are sexually active and are younger than 80 years of age.  You are older than 80 years of age and your health care provider tells you that you are at risk for this type of infection.  Your sexual activity has changed since you were last screened and you are at an increased risk for chlamydia or gonorrhea. Ask your health care provider if you are at risk.  If you do not have HIV, but are at risk, it may be recommended that you take a prescription medicine daily to prevent HIV  infection. This is called pre-exposure prophylaxis (PrEP). You are considered at risk if:  You are sexually active and do not regularly use condoms or know the HIV status of your partner(s).  You take drugs by injection.  You are sexually active with a partner who has HIV. Talk with your health care provider about whether you are at high risk of being infected with HIV. If you choose to begin PrEP, you should first be tested for HIV. You should then be tested every 3 months for as long as you are taking PrEP.  PREGNANCY   If you are premenopausal and you may become pregnant, ask your health care provider about preconception counseling.  If you may become pregnant, take 400 to 800 micrograms (mcg) of folic acid every day.  If you want to prevent pregnancy, talk to your health care provider about birth control (contraception). OSTEOPOROSIS AND MENOPAUSE   Osteoporosis is a disease in which the bones lose minerals and strength with aging. This can result in serious bone fractures. Your risk for osteoporosis can be identified using a bone density scan.  If you are 31 years of age or older, or if you are at risk for osteoporosis and fractures, ask your health care provider if you should be screened.  Ask your health care provider whether you should take a calcium or vitamin D supplement to lower your risk for osteoporosis.  Menopause may have certain physical symptoms and risks.  Hormone replacement therapy may reduce some of these symptoms and risks. Talk to your health care provider about whether hormone replacement therapy is right for you.  HOME CARE INSTRUCTIONS   Schedule regular health, dental, and eye exams.  Stay current with your immunizations.   Do not use any tobacco products including cigarettes, chewing tobacco, or electronic cigarettes.  If you are pregnant, do not drink alcohol.  If you are breastfeeding, limit how much and how often you drink alcohol.  Limit  alcohol intake to no more than 1 drink per day for nonpregnant women. One drink equals 12 ounces of beer, 5 ounces of wine, or 1 ounces of hard liquor.  Do not use street drugs.  Do not share needles.  Ask your health care provider for help if you need support or information about quitting drugs.  Tell your health care provider if you often feel depressed.  Tell your health care provider if you have ever been abused or do not feel safe at home.   This information is not intended to replace advice given to you by your health care provider. Make sure you discuss any questions you have with your health care provider.   Document Released: 02/02/2011 Document Revised: 08/10/2014 Document Reviewed: 06/21/2013 Elsevier Interactive Patient Education 2016 [place]  protect organs, store calcium, and anchor muscles. Good health habits, such as eating nutritious foods and exercising regularly, are important for maintaining healthy bones. They can also help to prevent a condition that causes bones to lose density and become weak and brittle (osteoporosis). WHY IS BONE MASS IMPORTANT? Bone mass refers to the amount of bone tissue that you have. The higher your bone mass, the stronger your bones. An important step toward having healthy bones throughout life is to have strong and dense bones during childhood. A young adult who has a high bone mass is more likely to have a high bone mass later in life. Bone mass at its greatest it is called peak bone mass. A large decline in bone mass occurs in older adults. In women, it occurs about the time of menopause. During this time, it is important to practice good health habits, because if more bone is lost than what is replaced, the bones will become less healthy and more likely to break (fracture). If you find that you have a low bone mass, you may be able to prevent osteoporosis or further bone loss by changing your diet and  lifestyle. HOW CAN I FIND OUT IF MY BONE MASS IS LOW? Bone mass can be measured with an X-ray test that is called a bone mineral density (BMD) test. This test is recommended for all women who are age 35 or older. It may also be recommended for men who are age 51 or older, or for people who are more likely to develop osteoporosis due to:  Having bones that break easily.  Having a long-term disease that weakens bones, such as kidney disease or rheumatoid arthritis.  Having menopause earlier than normal.  Taking medicine that weakens bones, such as steroids, thyroid hormones, or hormone treatment for breast cancer or prostate cancer.  Smoking.  Drinking three or more alcoholic drinks each day. WHAT ARE THE NUTRITIONAL RECOMMENDATIONS FOR HEALTHY BONES? To have healthy bones, you need to get enough of the right minerals and vitamins. Most nutrition experts recommend getting these nutrients from the foods that you eat. Nutritional recommendations vary from person to person. Ask your health care provider what is healthy for you. Here are some general guidelines. Calcium Recommendations Calcium is the most important (essential) mineral for bone health. Most people can get enough calcium from their diet, but supplements may be recommended for people who are at risk for osteoporosis. Good sources of calcium include:  Dairy products, such as low-fat or nonfat milk, cheese, and yogurt.  Dark green leafy vegetables, such as bok choy and broccoli.  Calcium-fortified foods, such as orange juice, cereal, bread, soy beverages, and tofu products.  Nuts, such as almonds. Follow these recommended amounts for daily calcium intake:  Children, age 3-3: 700 mg.  Children, age 45-8: 1,000 mg.  Children, age 51-13: 1,300 mg.  Teens, age 31-18: 1,300 mg.  Adults, age 32-50: 1,000 mg.  Adults, age 455-70:  Men: 1,000 mg.  Women: 1,200 mg.  Adults, age 24 or older: 1,200 mg.  Pregnant and  breastfeeding females:  Teens: 1,300 mg.  Adults: 1,000 mg. Vitamin D Recommendations Vitamin D is the most essential vitamin for bone health. It helps the body to absorb calcium. Sunlight stimulates the skin to make vitamin D, so be sure to get enough sunlight. If you live in a cold climate or you do not get outside often, your health care provider may recommend that you take vitamin D supplements. Good sources of  vitamin D in your diet include:  Egg yolks.  Saltwater fish.  Milk and cereal fortified with vitamin D. Follow these recommended amounts for daily vitamin D intake:  Children and teens, age 81-18: 600 international units.  Adults, age 93 or younger: 400-800 international units.  Adults, age 23 or older: 800-1,000 international units. Other Nutrients Other nutrients for bone health include:  Phosphorus. This mineral is found in meat, poultry, dairy foods, nuts, and legumes. The recommended daily intake for adult men and adult women is 700 mg.  Magnesium. This mineral is found in seeds, nuts, dark green vegetables, and legumes. The recommended daily intake for adult men is 400-420 mg. For adult women, it is 310-320 mg.  Vitamin K. This vitamin is found in green leafy vegetables. The recommended daily intake is 120 mg for adult men and 90 mg for adult women. WHAT TYPE OF PHYSICAL ACTIVITY IS BEST FOR BUILDING AND MAINTAINING HEALTHY BONES? Weight-bearing and strength-building activities are important for building and maintaining peak bone mass. Weight-bearing activities cause muscles and bones to work against gravity. Strength-building activities increases muscle strength that supports bones. Weight-bearing and muscle-building activities include:  Walking and hiking.  Jogging and running.  Dancing.  Gym exercises.  Lifting weights.  Tennis and racquetball.  Climbing stairs.  Aerobics. Adults should get at least 30 minutes of moderate physical activity on most  days. Children should get at least 60 minutes of moderate physical activity on most days. Ask your health care provide what type of exercise is best for you. WHERE CAN I FIND MORE INFORMATION? For more information, check out the following websites:  [place]: YardHomes.se  [employer]: http://www.niams.AnonymousEar.fr.asp   This information is not intended to replace advice given to you by your health care provider. Make sure you discuss any questions you have with your health care provider.   Document Released: 10/10/2003 Document Revised: 12/04/2014 Document Reviewed: 07/25/2014 Elsevier Interactive Patient Education 2016 [place] K. [name] M.D.

## 2015-11-18 NOTE — Patient Instructions (Addendum)
Continue lifestyle intervention healthy eating and exercise . Limiting sweets  And ok .  consider stopping the cholesterol medication   Increase risk of diabetes in older women age groups .  Consider getting a bone density  To look at risk of  Fracture   .        Health Maintenance, Female Adopting a healthy lifestyle and getting preventive care can go a long way to promote health and wellness. Talk with your health care provider about what schedule of regular examinations is right for you. This is a good chance for you to check in with your provider about disease prevention and staying healthy. In between checkups, there are plenty of things you can do on your own. Experts have done a lot of research about which lifestyle changes and preventive measures are most likely to keep you healthy. Ask your health care provider for more information. WEIGHT AND DIET  Eat a healthy diet  Be sure to include plenty of vegetables, fruits, low-fat dairy products, and lean protein.  Do not eat a lot of foods high in solid fats, added sugars, or salt.  Get regular exercise. This is one of the most important things you can do for your health.  Most adults should exercise for at least 150 minutes each week. The exercise should increase your heart rate and make you sweat (moderate-intensity exercise).  Most adults should also do strengthening exercises at least twice a week. This is in addition to the moderate-intensity exercise.  Maintain a healthy weight  Body mass index (BMI) is a measurement that can be used to identify possible weight problems. It estimates body fat based on height and weight. Your health care provider can help determine your BMI and help you achieve or maintain a healthy weight.  For females 89 years of age and older:   A BMI below 18.5 is considered underweight.  A BMI of 18.5 to 24.9 is normal.  A BMI of 25 to 29.9 is considered overweight.  A BMI of 30 and above is  considered obese.  Watch levels of cholesterol and blood lipids  You should start having your blood tested for lipids and cholesterol at 80 years of age, then have this test every 5 years.  You may need to have your cholesterol levels checked more often if:  Your lipid or cholesterol levels are high.  You are older than 80 years of age.  You are at high risk for heart disease.  CANCER SCREENING   Lung Cancer  Lung cancer screening is recommended for adults 80-41 years old who are at high risk for lung cancer because of a history of smoking.  A yearly low-dose CT scan of the lungs is recommended for people who:  Currently smoke.  Have quit within the past 15 years.  Have at least a 30-pack-year history of smoking. A pack year is smoking an average of one pack of cigarettes a day for 1 year.  Yearly screening should continue until it has been 15 years since you quit.  Yearly screening should stop if you develop a health problem that would prevent you from having lung cancer treatment.  Breast Cancer  Practice breast self-awareness. This means understanding how your breasts normally appear and feel.  It also means doing regular breast self-exams. Let your health care provider know about any changes, no matter how small.  If you are in your 20s or 30s, you should have a clinical breast exam (CBE) by a  health care provider every 1-3 years as part of a regular health exam.  If you are 40 or older, have a CBE every year. Also consider having a breast X-ray (mammogram) every year.  If you have a family history of breast cancer, talk to your health care provider about genetic screening.  If you are at high risk for breast cancer, talk to your health care provider about having an MRI and a mammogram every year.  Breast cancer gene (BRCA) assessment is recommended for women who have family members with BRCA-related cancers. BRCA-related cancers  include:  Breast.  Ovarian.  Tubal.  Peritoneal cancers.  Results of the assessment will determine the need for genetic counseling and BRCA1 and BRCA2 testing. Cervical Cancer Your health care provider may recommend that you be screened regularly for cancer of the pelvic organs (ovaries, uterus, and vagina). This screening involves a pelvic examination, including checking for microscopic changes to the surface of your cervix (Pap test). You may be encouraged to have this screening done every 3 years, beginning at age 21.  For women ages 30-65, health care providers may recommend pelvic exams and Pap testing every 3 years, or they may recommend the Pap and pelvic exam, combined with testing for human papilloma virus (HPV), every 5 years. Some types of HPV increase your risk of cervical cancer. Testing for HPV may also be done on women of any age with unclear Pap test results.  Other health care providers may not recommend any screening for nonpregnant women who are considered low risk for pelvic cancer and who do not have symptoms. Ask your health care provider if a screening pelvic exam is right for you.  If you have had past treatment for cervical cancer or a condition that could lead to cancer, you need Pap tests and screening for cancer for at least 20 years after your treatment. If Pap tests have been discontinued, your risk factors (such as having a new sexual partner) need to be reassessed to determine if screening should resume. Some women have medical problems that increase the chance of getting cervical cancer. In these cases, your health care provider may recommend more frequent screening and Pap tests. Colorectal Cancer  This type of cancer can be detected and often prevented.  Routine colorectal cancer screening usually begins at 80 years of age and continues through 80 years of age.  Your health care provider may recommend screening at an earlier age if you have risk factors for  colon cancer.  Your health care provider may also recommend using home test kits to check for hidden blood in the stool.  A small camera at the end of a tube can be used to examine your colon directly (sigmoidoscopy or colonoscopy). This is done to check for the earliest forms of colorectal cancer.  Routine screening usually begins at age 50.  Direct examination of the colon should be repeated every 5-10 years through 80 years of age. However, you may need to be screened more often if early forms of precancerous polyps or small growths are found. Skin Cancer  Check your skin from head to toe regularly.  Tell your health care provider about any new moles or changes in moles, especially if there is a change in a mole's shape or color.  Also tell your health care provider if you have a mole that is larger than the size of a pencil eraser.  Always use sunscreen. Apply sunscreen liberally and repeatedly throughout the day.    Protect yourself by wearing long sleeves, pants, a wide-brimmed hat, and sunglasses whenever you are outside. HEART DISEASE, DIABETES, AND HIGH BLOOD PRESSURE   High blood pressure causes heart disease and increases the risk of stroke. High blood pressure is more likely to develop in:  People who have blood pressure in the high end of the normal range (130-139/85-89 mm Hg).  People who are overweight or obese.  People who are African American.  If you are 18-39 years of age, have your blood pressure checked every 3-5 years. If you are 40 years of age or older, have your blood pressure checked every year. You should have your blood pressure measured twice--once when you are at a hospital or clinic, and once when you are not at a hospital or clinic. Record the average of the two measurements. To check your blood pressure when you are not at a hospital or clinic, you can use:  An automated blood pressure machine at a pharmacy.  A home blood pressure monitor.  If you  are between 55 years and 79 years old, ask your health care provider if you should take aspirin to prevent strokes.  Have regular diabetes screenings. This involves taking a blood sample to check your fasting blood sugar level.  If you are at a normal weight and have a low risk for diabetes, have this test once every three years after 80 years of age.  If you are overweight and have a high risk for diabetes, consider being tested at a younger age or more often. PREVENTING INFECTION  Hepatitis B  If you have a higher risk for hepatitis B, you should be screened for this virus. You are considered at high risk for hepatitis B if:  You were born in a country where hepatitis B is common. Ask your health care provider which countries are considered high risk.  Your parents were born in a high-risk country, and you have not been immunized against hepatitis B (hepatitis B vaccine).  You have HIV or AIDS.  You use needles to inject street drugs.  You live with someone who has hepatitis B.  You have had sex with someone who has hepatitis B.  You get hemodialysis treatment.  You take certain medicines for conditions, including cancer, organ transplantation, and autoimmune conditions. Hepatitis C  Blood testing is recommended for:  Everyone born from 1945 through 1965.  Anyone with known risk factors for hepatitis C. Sexually transmitted infections (STIs)  You should be screened for sexually transmitted infections (STIs) including gonorrhea and chlamydia if:  You are sexually active and are younger than 80 years of age.  You are older than 80 years of age and your health care provider tells you that you are at risk for this type of infection.  Your sexual activity has changed since you were last screened and you are at an increased risk for chlamydia or gonorrhea. Ask your health care provider if you are at risk.  If you do not have HIV, but are at risk, it may be recommended that you  take a prescription medicine daily to prevent HIV infection. This is called pre-exposure prophylaxis (PrEP). You are considered at risk if:  You are sexually active and do not regularly use condoms or know the HIV status of your partner(s).  You take drugs by injection.  You are sexually active with a partner who has HIV. Talk with your health care provider about whether you are at high risk of being infected   with HIV. If you choose to begin PrEP, you should first be tested for HIV. You should then be tested every 3 months for as long as you are taking PrEP.  PREGNANCY   If you are premenopausal and you may become pregnant, ask your health care provider about preconception counseling.  If you may become pregnant, take 400 to 800 micrograms (mcg) of folic acid every day.  If you want to prevent pregnancy, talk to your health care provider about birth control (contraception). OSTEOPOROSIS AND MENOPAUSE   Osteoporosis is a disease in which the bones lose minerals and strength with aging. This can result in serious bone fractures. Your risk for osteoporosis can be identified using a bone density scan.  If you are 45 years of age or older, or if you are at risk for osteoporosis and fractures, ask your health care provider if you should be screened.  Ask your health care provider whether you should take a calcium or vitamin D supplement to lower your risk for osteoporosis.  Menopause may have certain physical symptoms and risks.  Hormone replacement therapy may reduce some of these symptoms and risks. Talk to your health care provider about whether hormone replacement therapy is right for you.  HOME CARE INSTRUCTIONS   Schedule regular health, dental, and eye exams.  Stay current with your immunizations.   Do not use any tobacco products including cigarettes, chewing tobacco, or electronic cigarettes.  If you are pregnant, do not drink alcohol.  If you are breastfeeding, limit how  much and how often you drink alcohol.  Limit alcohol intake to no more than 1 drink per day for nonpregnant women. One drink equals 12 ounces of beer, 5 ounces of wine, or 1 ounces of hard liquor.  Do not use street drugs.  Do not share needles.  Ask your health care provider for help if you need support or information about quitting drugs.  Tell your health care provider if you often feel depressed.  Tell your health care provider if you have ever been abused or do not feel safe at home.   This information is not intended to replace advice given to you by your health care provider. Make sure you discuss any questions you have with your health care provider.   Document Released: 02/02/2011 Document Revised: 08/10/2014 Document Reviewed: 06/21/2013 Elsevier Interactive Patient Education 2016 Elsevier Inc.    Bone Health Bones protect organs, store calcium, and anchor muscles. Good health habits, such as eating nutritious foods and exercising regularly, are important for maintaining healthy bones. They can also help to prevent a condition that causes bones to lose density and become weak and brittle (osteoporosis). WHY IS BONE MASS IMPORTANT? Bone mass refers to the amount of bone tissue that you have. The higher your bone mass, the stronger your bones. An important step toward having healthy bones throughout life is to have strong and dense bones during childhood. A young adult who has a high bone mass is more likely to have a high bone mass later in life. Bone mass at its greatest it is called peak bone mass. A large decline in bone mass occurs in older adults. In women, it occurs about the time of menopause. During this time, it is important to practice good health habits, because if more bone is lost than what is replaced, the bones will become less healthy and more likely to break (fracture). If you find that you have a low bone mass, you may be  able to prevent osteoporosis or further  bone loss by changing your diet and lifestyle. HOW CAN I FIND OUT IF MY BONE MASS IS LOW? Bone mass can be measured with an X-ray test that is called a bone mineral density (BMD) test. This test is recommended for all women who are age 52 or older. It may also be recommended for men who are age 30 or older, or for people who are more likely to develop osteoporosis due to:  Having bones that break easily.  Having a long-term disease that weakens bones, such as kidney disease or rheumatoid arthritis.  Having menopause earlier than normal.  Taking medicine that weakens bones, such as steroids, thyroid hormones, or hormone treatment for breast cancer or prostate cancer.  Smoking.  Drinking three or more alcoholic drinks each day. WHAT ARE THE NUTRITIONAL RECOMMENDATIONS FOR HEALTHY BONES? To have healthy bones, you need to get enough of the right minerals and vitamins. Most nutrition experts recommend getting these nutrients from the foods that you eat. Nutritional recommendations vary from person to person. Ask your health care provider what is healthy for you. Here are some general guidelines. Calcium Recommendations Calcium is the most important (essential) mineral for bone health. Most people can get enough calcium from their diet, but supplements may be recommended for people who are at risk for osteoporosis. Good sources of calcium include:  Dairy products, such as low-fat or nonfat milk, cheese, and yogurt.  Dark green leafy vegetables, such as bok choy and broccoli.  Calcium-fortified foods, such as orange juice, cereal, bread, soy beverages, and tofu products.  Nuts, such as almonds. Follow these recommended amounts for daily calcium intake:  Children, age 25-3: 700 mg.  Children, age 44-8: 1,000 mg.  Children, age 50-13: 1,300 mg.  Teens, age 72-18: 1,300 mg.  Adults, age 27-50: 1,000 mg.  Adults, age 63-70:  Men: 1,000 mg.  Women: 1,200 mg.  Adults, age 30 or older:  1,200 mg.  Pregnant and breastfeeding females:  Teens: 1,300 mg.  Adults: 1,000 mg. Vitamin D Recommendations Vitamin D is the most essential vitamin for bone health. It helps the body to absorb calcium. Sunlight stimulates the skin to make vitamin D, so be sure to get enough sunlight. If you live in a cold climate or you do not get outside often, your health care provider may recommend that you take vitamin D supplements. Good sources of vitamin D in your diet include:  Egg yolks.  Saltwater fish.  Milk and cereal fortified with vitamin D. Follow these recommended amounts for daily vitamin D intake:  Children and teens, age 25-18: 600 international units.  Adults, age 20 or younger: 400-800 international units.  Adults, age 444 or older: 800-1,000 international units. Other Nutrients Other nutrients for bone health include:  Phosphorus. This mineral is found in meat, poultry, dairy foods, nuts, and legumes. The recommended daily intake for adult men and adult women is 700 mg.  Magnesium. This mineral is found in seeds, nuts, dark green vegetables, and legumes. The recommended daily intake for adult men is 400-420 mg. For adult women, it is 310-320 mg.  Vitamin K. This vitamin is found in green leafy vegetables. The recommended daily intake is 120 mg for adult men and 90 mg for adult women. WHAT TYPE OF PHYSICAL ACTIVITY IS BEST FOR BUILDING AND MAINTAINING HEALTHY BONES? Weight-bearing and strength-building activities are important for building and maintaining peak bone mass. Weight-bearing activities cause muscles and bones to work against gravity.  Strength-building activities increases muscle strength that supports bones. Weight-bearing and muscle-building activities include:  Walking and hiking.  Jogging and running.  Dancing.  Gym exercises.  Lifting weights.  Tennis and racquetball.  Climbing stairs.  Aerobics. Adults should get at least 30 minutes of moderate  physical activity on most days. Children should get at least 60 minutes of moderate physical activity on most days. Ask your health care provide what type of exercise is best for you. WHERE CAN I FIND MORE INFORMATION? For more information, check out the following websites:  [place]: YardHomes.se  [employer]: http://www.niams.AnonymousEar.fr.asp   This information is not intended to replace advice given to you by your health care provider. Make sure you discuss any questions you have with your health care provider.   Document Released: 10/10/2003 Document Revised: 12/04/2014 Document Reviewed: 07/25/2014 Elsevier Interactive Patient Education [employer].

## 2015-11-19 ENCOUNTER — Ambulatory Visit (INDEPENDENT_AMBULATORY_CARE_PROVIDER_SITE_OTHER): Payer: Medicare Other | Admitting: Internal Medicine

## 2015-11-19 ENCOUNTER — Encounter: Payer: Self-pay | Admitting: Internal Medicine

## 2015-11-19 VITALS — BP 142/70 | Temp 98.2°F | Ht 63.0 in | Wt 124.0 lb

## 2015-11-19 DIAGNOSIS — R7301 Impaired fasting glucose: Secondary | ICD-10-CM | POA: Diagnosis not present

## 2015-11-19 DIAGNOSIS — Z79899 Other long term (current) drug therapy: Secondary | ICD-10-CM

## 2015-11-19 DIAGNOSIS — Z0000 Encounter for general adult medical examination without abnormal findings: Secondary | ICD-10-CM | POA: Diagnosis not present

## 2015-11-19 DIAGNOSIS — H9193 Unspecified hearing loss, bilateral: Secondary | ICD-10-CM

## 2015-11-19 DIAGNOSIS — E785 Hyperlipidemia, unspecified: Secondary | ICD-10-CM | POA: Diagnosis not present

## 2015-11-19 DIAGNOSIS — I10 Essential (primary) hypertension: Secondary | ICD-10-CM | POA: Diagnosis not present

## 2015-11-19 DIAGNOSIS — Z853 Personal history of malignant neoplasm of breast: Secondary | ICD-10-CM

## 2015-11-19 LAB — CBC WITH DIFFERENTIAL/PLATELET
BASOS PCT: 0.5 % (ref 0.0–3.0)
Basophils Absolute: 0 10*3/uL (ref 0.0–0.1)
EOS PCT: 1.3 % (ref 0.0–5.0)
Eosinophils Absolute: 0.1 10*3/uL (ref 0.0–0.7)
HCT: 41.7 % (ref 36.0–46.0)
Hemoglobin: 14.1 g/dL (ref 12.0–15.0)
Lymphocytes Relative: 21.7 % (ref 12.0–46.0)
Lymphs Abs: 1.3 10*3/uL (ref 0.7–4.0)
MCHC: 33.8 g/dL (ref 30.0–36.0)
MCV: 92.9 fl (ref 78.0–100.0)
Monocytes Absolute: 0.5 10*3/uL (ref 0.1–1.0)
Monocytes Relative: 7.7 % (ref 3.0–12.0)
NEUTROS ABS: 4 10*3/uL (ref 1.4–7.7)
NEUTROS PCT: 68.8 % (ref 43.0–77.0)
PLATELETS: 139 10*3/uL — AB (ref 150.0–400.0)
RBC: 4.49 Mil/uL (ref 3.87–5.11)
RDW: 12.5 % (ref 11.5–15.5)
WBC: 5.9 10*3/uL (ref 4.0–10.5)

## 2015-11-19 LAB — BASIC METABOLIC PANEL
BUN: 24 mg/dL — ABNORMAL HIGH (ref 6–23)
CO2: 27 meq/L (ref 19–32)
CREATININE: 0.78 mg/dL (ref 0.40–1.20)
Calcium: 9.5 mg/dL (ref 8.4–10.5)
Chloride: 105 mEq/L (ref 96–112)
GFR: 75.12 mL/min (ref >60.00)
Glucose, Bld: 99 mg/dL (ref 70–99)
Potassium: 4.3 mEq/L (ref 3.5–5.1)
SODIUM: 141 meq/L (ref 135–145)

## 2015-11-19 LAB — HEPATIC FUNCTION PANEL
ALBUMIN: 4.7 g/dL (ref 3.5–5.2)
ALT: 26 U/L (ref 0–35)
AST: 33 U/L (ref 0–37)
Alkaline Phosphatase: 61 U/L (ref 39–117)
Bilirubin, Direct: 0.2 mg/dL (ref 0.0–0.3)
Total Bilirubin: 1 mg/dL (ref 0.2–1.2)
Total Protein: 7.2 g/dL (ref 6.0–8.3)

## 2015-11-19 LAB — LIPID PANEL
CHOLESTEROL: 162 mg/dL (ref 0–200)
HDL: 62.8 mg/dL (ref >39.00)
LDL Cholesterol: 87 mg/dL (ref 0–99)
NonHDL: 99.15
Total CHOL/HDL Ratio: 3
Triglycerides: 63 mg/dL (ref 0.0–149.0)
VLDL: 12.6 mg/dL (ref 0.0–40.0)

## 2015-11-19 LAB — HEMOGLOBIN A1C: HEMOGLOBIN A1C: 6.1 % (ref 4.6–6.5)

## 2015-11-19 LAB — TSH: TSH: 1.29 u[IU]/mL (ref 0.35–4.50)

## 2015-11-29 ENCOUNTER — Telehealth: Payer: Self-pay | Admitting: Internal Medicine

## 2015-11-29 ENCOUNTER — Encounter: Payer: Self-pay | Admitting: Internal Medicine

## 2015-11-29 ENCOUNTER — Ambulatory Visit (INDEPENDENT_AMBULATORY_CARE_PROVIDER_SITE_OTHER): Payer: Medicare Other | Admitting: Internal Medicine

## 2015-11-29 VITALS — BP 138/80 | Temp 98.1°F | Ht 63.0 in | Wt 124.6 lb

## 2015-11-29 DIAGNOSIS — L089 Local infection of the skin and subcutaneous tissue, unspecified: Secondary | ICD-10-CM

## 2015-11-29 MED ORDER — DOXYCYCLINE HYCLATE 100 MG PO TABS: 100.0000 mg | ORAL_TABLET | Freq: Two times a day (BID) | ORAL | Status: DC

## 2015-11-29 NOTE — Telephone Encounter (Signed)
[facility] [place] [facility]  Patient Name: [name] [name]  DOB: [birth year]    Initial Comment Caller states may tick embedded in leg, red area with a dark spot   Nurse Assessment  Nurse: Wynetta Emery RN, Baker Janus Date/Time (Eastern Time): 11/29/2015 9:06:01 AM  Confirm and document reason for call. If symptomatic, describe symptoms. You must click the next button to save text entered. ---Pleas Koch noted a tick embedded in right lower calf above sock level today the area is red around and now has dark purple area size of pencil area. itches  Has the patient traveled out of the country within the last 30 days? ---No  Does the patient have any new or worsening symptoms? ---Yes  Will a triage be completed? ---Yes  Related visit to physician within the last 2 weeks? ---No  Does the PT have any chronic conditions? (i.e. diabetes, asthma, etc.) ---No  Is this a behavioral health or substance abuse call? ---No     Guidelines    Guideline Title Affirmed Question Affirmed Notes  Tick Bite Can't remove live tick (after trying Care Advice)    Final Disposition User   See Physician within 4 Hours (or PCP triage) Wynetta Emery, RN, Baker Janus    Comments  NOTE: appt given for 1045am 11-29-2015 with Standley Brooking. [name], MD   Referrals  REFERRED TO PCP OFFICE   Disagree/Comply: Comply

## 2015-11-29 NOTE — Patient Instructions (Signed)
I don't see a   Tick now or FB. Could be a  Scratch from a bush or thorn .   Looks infected   Begin antibiotic for infection.,  Take  Up to 10 days  .  Can add  Topical antibiotic  also gentle cleaning  Expect redness to subside over the next 3-5 days   If not  Then contact for recheck

## 2015-11-29 NOTE — Progress Notes (Signed)
Pre visit review using our clinic review tool, if applicable. No additional management support is needed unless otherwise documented below in the visit note.  Chief Complaint  Patient presents with  . Insect Bite    HPI: [name] [name] 82 y.o.  Comes in for acute visit  ? Tick bit   Works  in yard noted itching  Lumps and then last night  Was red and swollen   ? If had a bit didn't see tick but daughter though maybe  Needed to be seen.  No fever  Systemic sx  Put etoh on it Taking antihistamine helped itching  Red may be down today ROS: See pertinent positives and negatives per HPI. No fever  Past Medical History  Diagnosis Date  . Breast cancer ([place]) 1978    left breast  . Osteoarthritis   . Urinary incontinence   . Hyperlipidemia   . Hypertension   . Cystitis, interstitial   . Adenomatous colon polyp      4 2008  . Fracture     left ankle and rt wrist  . FRACTURE, ANKLE, LEFT 12/14/2006    Qualifier: Diagnosis of  By: Hulan Saas, CMA (AAMA), Quita Skye   . FRACTURE, WRIST 12/14/2006    Qualifier: Diagnosis of  By: Hulan Saas, CMA (AAMA), Quita Skye     Family History  Problem Relation Age of Onset  . Suicidality Mother   . Heart attack Father   . Cancer - Other Daughter     throat cancer     Social History   Social History  . Marital Status: Married    Spouse Name: N/A  . Number of Children: N/A  . Years of Education: N/A   Social History Main Topics  . Smoking status: Former Research scientist (life sciences)  . Smokeless tobacco: None  . Alcohol Use: Yes  . Drug Use: No  . Sexual Activity: Not Asked   Other Topics Concern  . None   Social History Narrative   Retired   Married   [place] of 2   Husband with medical problems   g5 p3   Ex smoker  Quit 33 years ago  [place]    Outpatient Prescriptions Prior to Visit  Medication Sig Dispense Refill  . amLODipine (NORVASC) 5 MG tablet TAKE ONE TABLET BY MOUTH ONCE DAILY 90 tablet 1  . Ascorbic Acid (VITAMIN C) 500 MG tablet Take 500  mg by mouth daily. 1 bid    . Calcium Carbonate-Vitamin D (CALCIUM-D PO) Take 2 tablets by mouth daily.    Marland Kitchen CINNAMON PO Take 2,000 mg by mouth daily.    . fish oil-omega-3 fatty acids 1000 MG capsule Take 2 g by mouth daily.      . Magnesium 400 MG TABS Take 2 tablets by mouth daily.    . Multiple Vitamins-Minerals (CENTRUM SILVER PO) Take by mouth.      . simvastatin (ZOCOR) 20 MG tablet TAKE ONE TABLET BY MOUTH ONCE DAILY 90 tablet 1  . Vitamin D, Cholecalciferol, 1000 UNITS TABS Take 2 tablets by mouth daily.     No facility-administered medications prior to visit.     EXAM:  BP 138/80 mmHg  Temp(Src) 98.1 F (36.7 C) (Oral)  Ht 5\' 3"  (1.6 m)  Wt 124 lb 9.6 oz (56.518 kg)  BMI 22.08 kg/m2  Body mass index is 22.08 kg/(m^2).  GENERAL: vitals reviewed and listed above, alert, oriented, appears well hydrated and in no acute distress Right leg  Medial upper  right calf with jagged small skin puntcure? With dark scab not a tick and  Surrounding 4-5 cm induration tender an no fluctuance  No streaking  ( pt says sore and itchy) PSYCH: pleasant and cooperative, no obvious depression or anxiety  ASSESSMENT AND PLAN:  Discussed the following assessment and plan:  Skin infection - from bite or scratch no fb seen but gouge in central area of redness and soreness  -Patient advised to return or notify health care team  if symptoms worsen ,persist or new concerns arise.  Patient Instructions  I don't see a   Tick now or FB. Could be a  Scratch from a bush or thorn .   Looks infected   Begin antibiotic for infection.,  Take  Up to 10 days  .  Can add  Topical antibiotic  also gentle cleaning  Expect redness to subside over the next 3-5 days   If not  Then contact for recheck      Lash Matulich K. Yadira Hada M.D.

## 2016-01-07 DIAGNOSIS — H2513 Age-related nuclear cataract, bilateral: Secondary | ICD-10-CM | POA: Diagnosis not present

## 2016-01-08 ENCOUNTER — Other Ambulatory Visit: Payer: Self-pay | Admitting: Internal Medicine

## 2016-04-22 NOTE — Progress Notes (Signed)
Pre visit review using our clinic review tool, if applicable. No additional management support is needed unless otherwise documented below in the visit note.  Chief Complaint  Patient presents with  . Right Buttock Pain    X1wk  . Right Leg Pain    HPI:  [name] [name] 82 y.o.  sda appt .    Patient has had a hamstring pull and was supposed to be doing exercises hasn't been doing them as much. Right leg. Over the last 1-2 weeks without a specific injury she's having intermittent right buttocks pain that is like electrical shocks that go down the back of her leg but not to her foot. She readjusts her self it goes away. Doesn't go to the foot no weakness falling other injury. She has done a lot of yard work taking care of repair opacity was since passed away month other things.  She is concerned bit about her blood sugar as we didn't check it last time. Tries to avoid starches.      Would like to have her flu shot today. ROS: See pertinent positives and negatives per HPI.  Past Medical History:  Diagnosis Date  . Adenomatous colon polyp     4 2008  . Breast cancer ([place]) 1978   left breast  . Cystitis, interstitial   . Fracture    left ankle and rt wrist  . FRACTURE, ANKLE, LEFT 12/14/2006   Qualifier: Diagnosis of  By: Hulan Saas, CMA (AAMA), Quita Skye   . FRACTURE, WRIST 12/14/2006   Qualifier: Diagnosis of  By: Hulan Saas, CMA (AAMA), Quita Skye   . Hyperlipidemia   . Hypertension   . Osteoarthritis   . Urinary incontinence     Family History  Problem Relation Age of Onset  . Suicidality Mother   . Heart attack Father   . Cancer - Other Daughter     throat cancer     Social History   Social History  . Marital status: Married    Spouse name: N/A  . Number of children: N/A  . Years of education: N/A   Social History Main Topics  . Smoking status: Former Research scientist (life sciences)  . Smokeless tobacco: None  . Alcohol use Yes  . Drug use: No  . Sexual activity: Not Asked   Other  Topics Concern  . None   Social History Narrative   Retired   Married   [place] of 2   Husband with medical problems   g5 p3   Ex smoker  Quit 33 years ago  [place]    Outpatient Medications Prior to Visit  Medication Sig Dispense Refill  . amLODipine (NORVASC) 5 MG tablet TAKE ONE TABLET BY MOUTH ONCE DAILY 90 tablet 1  . Ascorbic Acid (VITAMIN C) 500 MG tablet Take 500 mg by mouth daily. 1 bid    . Calcium Carbonate-Vitamin D (CALCIUM-D PO) Take 2 tablets by mouth daily.    Marland Kitchen CINNAMON PO Take 2,000 mg by mouth daily.    . fish oil-omega-3 fatty acids 1000 MG capsule Take 2 g by mouth daily.      . Magnesium 400 MG TABS Take 2 tablets by mouth daily.    . Multiple Vitamins-Minerals (CENTRUM SILVER PO) Take by mouth.      . Vitamin D, Cholecalciferol, 1000 UNITS TABS Take 2 tablets by mouth daily.    Marland Kitchen doxycycline (VIBRA-TABS) 100 MG tablet Take 1 tablet (100 mg total) by mouth 2 (two) times daily. 20 tablet 0  .  simvastatin (ZOCOR) 20 MG tablet TAKE ONE TABLET BY MOUTH ONCE DAILY 90 tablet 1   No facility-administered medications prior to visit.      EXAM:  BP 134/72 (BP Location: Right Arm, Patient Position: Sitting, Cuff Size: Normal)   Temp 98.1 F (36.7 C) (Oral)   Ht 5\' 3"  (1.6 m)   Wt 120 lb 3.2 oz (54.5 kg)   BMI 21.29 kg/m   Body mass index is 21.29 kg/m.  GENERAL: vitals reviewed and listed above, alert, oriented, appears well hydrated and in no acute distress HEENT: atraumatic, conjunctiva  clear, no obvious abnormalities on inspection of external nose and ears  NECK: no obvious masses on inspection palpation   CV: HRRR, no clubbing cyanosis or  peripheral edema nl cap refill  MS: moves all extremities without noticeable focal  Abnormality mild djd gait steady  toe heel walk is normal DTRs are present. Good range of motion area of concern is the right buttocks area of notes not having her pain right now. PSYCH: pleasant and cooperative, no obvious depression  or anxiety Lab Results  Component Value Date   HGBA1C 5.5 04/23/2016    ASSESSMENT AND PLAN:  Discussed the following assessment and plan:  Sciatic pain, right - Seems to be mechanical without alarming findings. I agree with resuming her exercises consider seeing her sports medicine or it though if persistent progressive  Fasting hyperglycemia - a1c now in normal range  avoid losing more weight can add some complex carbs  - Plan: POCT A1C  Need for prophylactic vaccination and inoculation against influenza - Plan: Flu vaccine HIGH DOSE PF (Fluzone High dose) Remote hx of breast cancer    -Patient advised to return or notify health care team  if symptoms worsen ,persist or new concerns arise.  Patient Instructions   I agree this is a form of sciatica and   Poss pinched nerve .      I agree doing exercise stretch is Good .  ocass  caution with nsaid.  Tylenol ok  If  persistent or progressive we have you see a specialist   Ortho .   Sports medicine.      Wt Readings from Last 3 Encounters:  04/23/16 120 lb 3.2 oz (54.5 kg)  11/29/15 124 lb 9.6 oz (56.5 kg)  11/19/15 124 lb (56.2 kg)   Flu shot today . Blood sugar is good   dont lose more weight .   Sciatica Sciatica is pain, weakness, numbness, or tingling along the path of the sciatic nerve. The nerve starts in the lower back and runs down the back of each leg. The nerve controls the muscles in the lower leg and in the back of the knee, while also providing sensation to the back of the thigh, lower leg, and the sole of your foot. Sciatica is a symptom of another medical condition. For instance, nerve damage or certain conditions, such as a herniated disk or bone spur on the spine, pinch or put pressure on the sciatic nerve. This causes the pain, weakness, or other sensations normally associated with sciatica. Generally, sciatica only affects one side of the body. CAUSES   Herniated or slipped disc.  Degenerative disk  disease.  A pain disorder involving the narrow muscle in the buttocks (piriformis syndrome).  Pelvic injury or fracture.  Pregnancy.  Tumor (rare). SYMPTOMS  Symptoms can vary from mild to very severe. The symptoms usually travel from the low back to the buttocks and down the  back of the leg. Symptoms can include:  Mild tingling or dull aches in the lower back, leg, or hip.  Numbness in the back of the calf or sole of the foot.  Burning sensations in the lower back, leg, or hip.  Sharp pains in the lower back, leg, or hip.  Leg weakness.  Severe back pain inhibiting movement. These symptoms may get worse with coughing, sneezing, laughing, or prolonged sitting or standing. Also, being overweight may worsen symptoms. DIAGNOSIS  Your caregiver will perform a physical exam to look for common symptoms of sciatica. He or she may ask you to do certain movements or activities that would trigger sciatic nerve pain. Other tests may be performed to find the cause of the sciatica. These may include:  Blood tests.  X-rays.  Imaging tests, such as an MRI or CT scan. TREATMENT  Treatment is directed at the cause of the sciatic pain. Sometimes, treatment is not necessary and the pain and discomfort goes away on its own. If treatment is needed, your caregiver may suggest:  Over-the-counter medicines to relieve pain.  Prescription medicines, such as anti-inflammatory medicine, muscle relaxants, or narcotics.  Applying heat or ice to the painful area.  Steroid injections to lessen pain, irritation, and inflammation around the nerve.  Reducing activity during periods of pain.  Exercising and stretching to strengthen your abdomen and improve flexibility of your spine. Your caregiver may suggest losing weight if the extra weight makes the back pain worse.  Physical therapy.  Surgery to eliminate what is pressing or pinching the nerve, such as a bone spur or part of a herniated disk. HOME  CARE INSTRUCTIONS   Only take over-the-counter or prescription medicines for pain or discomfort as directed by your caregiver.  Apply ice to the affected area for 20 minutes, 3-4 times a day for the first 48-72 hours. Then try heat in the same way.  Exercise, stretch, or perform your usual activities if these do not aggravate your pain.  Attend physical therapy sessions as directed by your caregiver.  Keep all follow-up appointments as directed by your caregiver.  Do not wear high heels or shoes that do not provide proper support.  Check your mattress to see if it is too soft. A firm mattress may lessen your pain and discomfort. SEEK IMMEDIATE MEDICAL CARE IF:   You lose control of your bowel or bladder (incontinence).  You have increasing weakness in the lower back, pelvis, buttocks, or legs.  You have redness or swelling of your back.  You have a burning sensation when you urinate.  You have pain that gets worse when you lie down or awakens you at night.  Your pain is worse than you have experienced in the past.  Your pain is lasting longer than 4 weeks.  You are suddenly losing weight without reason. MAKE SURE YOU:  Understand these instructions.  Will watch your condition.  Will get help right away if you are not doing well or get worse.   This information is not intended to replace advice given to you by your health care provider. Make sure you discuss any questions you have with your health care provider.   Document Released: 07/14/2001 Document Revised: 04/10/2015 Document Reviewed: 11/29/2011 Elsevier Interactive Patient Education 2016 [place] K. [name] M.D.

## 2016-04-23 ENCOUNTER — Encounter: Payer: Self-pay | Admitting: Internal Medicine

## 2016-04-23 ENCOUNTER — Ambulatory Visit (INDEPENDENT_AMBULATORY_CARE_PROVIDER_SITE_OTHER): Payer: Medicare Other | Admitting: Internal Medicine

## 2016-04-23 VITALS — BP 134/72 | Temp 98.1°F | Ht 63.0 in | Wt 120.2 lb

## 2016-04-23 DIAGNOSIS — Z23 Encounter for immunization: Secondary | ICD-10-CM | POA: Diagnosis not present

## 2016-04-23 DIAGNOSIS — M5431 Sciatica, right side: Secondary | ICD-10-CM

## 2016-04-23 DIAGNOSIS — R7301 Impaired fasting glucose: Secondary | ICD-10-CM | POA: Diagnosis not present

## 2016-04-23 LAB — POCT GLYCOSYLATED HEMOGLOBIN (HGB A1C): Hemoglobin A1C: 5.5

## 2016-04-23 NOTE — Patient Instructions (Addendum)
I agree this is a form of sciatica and   Poss pinched nerve .      I agree doing exercise stretch is Good .  ocass  caution with nsaid.  Tylenol ok  If  persistent or progressive we have you see a specialist   Ortho .   Sports medicine.      Wt Readings from Last 3 Encounters:  04/23/16 120 lb 3.2 oz (54.5 kg)  11/29/15 124 lb 9.6 oz (56.5 kg)  11/19/15 124 lb (56.2 kg)   Flu shot today . Blood sugar is good   dont lose more weight .   Sciatica Sciatica is pain, weakness, numbness, or tingling along the path of the sciatic nerve. The nerve starts in the lower back and runs down the back of each leg. The nerve controls the muscles in the lower leg and in the back of the knee, while also providing sensation to the back of the thigh, lower leg, and the sole of your foot. Sciatica is a symptom of another medical condition. For instance, nerve damage or certain conditions, such as a herniated disk or bone spur on the spine, pinch or put pressure on the sciatic nerve. This causes the pain, weakness, or other sensations normally associated with sciatica. Generally, sciatica only affects one side of the body. CAUSES   Herniated or slipped disc.  Degenerative disk disease.  A pain disorder involving the narrow muscle in the buttocks (piriformis syndrome).  Pelvic injury or fracture.  Pregnancy.  Tumor (rare). SYMPTOMS  Symptoms can vary from mild to very severe. The symptoms usually travel from the low back to the buttocks and down the back of the leg. Symptoms can include:  Mild tingling or dull aches in the lower back, leg, or hip.  Numbness in the back of the calf or sole of the foot.  Burning sensations in the lower back, leg, or hip.  Sharp pains in the lower back, leg, or hip.  Leg weakness.  Severe back pain inhibiting movement. These symptoms may get worse with coughing, sneezing, laughing, or prolonged sitting or standing. Also, being overweight may worsen  symptoms. DIAGNOSIS  Your caregiver will perform a physical exam to look for common symptoms of sciatica. He or she may ask you to do certain movements or activities that would trigger sciatic nerve pain. Other tests may be performed to find the cause of the sciatica. These may include:  Blood tests.  X-rays.  Imaging tests, such as an MRI or CT scan. TREATMENT  Treatment is directed at the cause of the sciatic pain. Sometimes, treatment is not necessary and the pain and discomfort goes away on its own. If treatment is needed, your caregiver may suggest:  Over-the-counter medicines to relieve pain.  Prescription medicines, such as anti-inflammatory medicine, muscle relaxants, or narcotics.  Applying heat or ice to the painful area.  Steroid injections to lessen pain, irritation, and inflammation around the nerve.  Reducing activity during periods of pain.  Exercising and stretching to strengthen your abdomen and improve flexibility of your spine. Your caregiver may suggest losing weight if the extra weight makes the back pain worse.  Physical therapy.  Surgery to eliminate what is pressing or pinching the nerve, such as a bone spur or part of a herniated disk. HOME CARE INSTRUCTIONS   Only take over-the-counter or prescription medicines for pain or discomfort as directed by your caregiver.  Apply ice to the affected area for 20 minutes, 3-4 times a  day for the first 48-72 hours. Then try heat in the same way.  Exercise, stretch, or perform your usual activities if these do not aggravate your pain.  Attend physical therapy sessions as directed by your caregiver.  Keep all follow-up appointments as directed by your caregiver.  Do not wear high heels or shoes that do not provide proper support.  Check your mattress to see if it is too soft. A firm mattress may lessen your pain and discomfort. SEEK IMMEDIATE MEDICAL CARE IF:   You lose control of your bowel or bladder  (incontinence).  You have increasing weakness in the lower back, pelvis, buttocks, or legs.  You have redness or swelling of your back.  You have a burning sensation when you urinate.  You have pain that gets worse when you lie down or awakens you at night.  Your pain is worse than you have experienced in the past.  Your pain is lasting longer than 4 weeks.  You are suddenly losing weight without reason. MAKE SURE YOU:  Understand these instructions.  Will watch your condition.  Will get help right away if you are not doing well or get worse.   This information is not intended to replace advice given to you by your health care provider. Make sure you discuss any questions you have with your health care provider.   Document Released: 07/14/2001 Document Revised: 04/10/2015 Document Reviewed: 11/29/2011 Elsevier Interactive Patient Education [employer].

## 2016-07-09 ENCOUNTER — Other Ambulatory Visit: Payer: Self-pay | Admitting: Internal Medicine

## 2016-07-13 NOTE — Telephone Encounter (Signed)
Sent to the pharmacy by e-scribe for 90 days.  Pt due for wellness 11/2016.

## 2016-08-10 ENCOUNTER — Telehealth: Payer: Self-pay | Admitting: Internal Medicine

## 2016-08-10 NOTE — Telephone Encounter (Signed)
Please have the pt make an appt.  Thanks!!

## 2016-08-10 NOTE — Telephone Encounter (Signed)
Pt would like to have her urine checked and think that she has a UTI or Bladder infection and she has taken some urine and it looks very orange and she feels uncomfortable.

## 2016-08-10 NOTE — Telephone Encounter (Signed)
° ° ° °  Pt call to say she has an appt with [facility] today, will call back if she need to see Dr Mamie Nick  .

## 2016-08-11 DIAGNOSIS — N3281 Overactive bladder: Secondary | ICD-10-CM | POA: Diagnosis not present

## 2016-08-11 DIAGNOSIS — R351 Nocturia: Secondary | ICD-10-CM | POA: Diagnosis not present

## 2016-08-11 DIAGNOSIS — R8279 Other abnormal findings on microbiological examination of urine: Secondary | ICD-10-CM | POA: Diagnosis not present

## 2016-10-13 ENCOUNTER — Other Ambulatory Visit: Payer: Self-pay | Admitting: Internal Medicine

## 2016-12-03 DIAGNOSIS — M1811 Unilateral primary osteoarthritis of first carpometacarpal joint, right hand: Secondary | ICD-10-CM | POA: Diagnosis not present

## 2016-12-03 DIAGNOSIS — M79644 Pain in right finger(s): Secondary | ICD-10-CM | POA: Diagnosis not present

## 2017-01-12 NOTE — Progress Notes (Signed)
Chief Complaint  Patient presents with  . Medication Management    yearly visit   . Hypertension    HPI: [name] 83 y.o. 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  No falling  Taking  Ca vit d   Says "is bad as didn't get mammogram and shingles vaccine etc.  Eats healthy no sugars   Denies specific depression   No falls   ROS: See pertinent positives and negatives per HPI. No cp sob  Gi gu changes  Has some nocuruia taking chamomille tea at night .    Past Medical History:  Diagnosis Date  . Adenomatous colon polyp     4 2008  . Breast cancer ([facility]) 1978   left breast  . Cystitis, interstitial   . Fracture    left ankle and rt wrist  . FRACTURE, ANKLE, LEFT 12/14/2006   Qualifier: Diagnosis of  By: Hulan Saas, CMA (AAMA), [place]   . FRACTURE, WRIST 12/14/2006   Qualifier: Diagnosis of  By: Hulan Saas, CMA (AAMA), [place]e   . Hyperlipidemia   . Hypertension   . Osteoarthritis   . Urinary incontinence     Family History  Problem Relation Age of Onset  . Suicidality Mother   . Heart attack Father   . Cancer - Other Daughter        throat cancer     Social History   Social History  . Marital status: Married    Spouse name: N/A  . Number of children: N/A  . Years of education: N/A   Social History Main Topics  . Smoking status: Former Research scientist (life sciences)  . Smokeless tobacco: Never Used  . Alcohol use Yes  . Drug use: No  . Sexual activity: Not Asked   Other Topics Concern  . None   Social History Narrative   Retired   Married   [place] of [place]   Husband with medical problems   g5 p3   Ex smoker  Quit 33 years ago  [place]    Outpatient Medications Prior to Visit  Medication Sig Dispense Refill  . amLODipine (NORVASC) 5 MG tablet TAKE ONE TABLET BY MOUTH ONCE DAILY 90 tablet 1  . Ascorbic Acid  (VITAMIN C) 500 MG tablet Take 500 mg by mouth daily. 1 bid    . Calcium Carbonate-Vitamin D (CALCIUM-D PO) Take 2 tablets by mouth daily.    Marland Kitchen CINNAMON PO Take 2,000 mg by mouth daily.    . fish oil-omega-3 fatty acids 1000 MG capsule Take 2 g by mouth daily.      . Magnesium 400 MG TABS Take 2 tablets by mouth daily.    . Multiple Vitamins-Minerals (CENTRUM SILVER PO) Take by mouth.      . Vitamin D, Cholecalciferol, 1000 UNITS TABS Take 2 tablets by mouth daily.     No facility-administered medications prior to visit.      EXAM:  BP 110/80 (BP Location: Right Arm, Patient Position: Sitting, Cuff Size: Normal)   Pulse 71   Temp 97.6 F (36.4 C) (Oral)   Ht 5\' 3"  (1.6 m)   Wt 120 lb 6.4 oz (54.6 kg)   BMI 21.33 kg/m   Body mass index is 21.33 kg/m. Physical Exam: Vital signs reviewed HFW:YOVZ is a  well-developed well-nourished alert cooperative  female who appears her stated age in no acute distress.  Hard of hearing  But conversation normal  HEENT: normocephalic atraumatic , Eyes: PERRL EOM's full, conjunctiva clear, glasses Nares: paten,t no deformity discharge or tenderness., Ears: no deformity EAC's clear TMs with normal landmarks. Mouth: clear OP, no lesions, edema.  Moist mucous membranes. Dentition in adequate repair. NECK: supple without masses, thyromegaly or bruits. CHEST/PULM:  Clear to auscultation and percussion breath sounds equal no wheeze , rales or rhonchi. No chest wall deformities or tenderness.  Breast absent left right  Lumpiness but no discrete mass ?  No adenopathy  CV: PMI is nondisplaced, S1 S2 no gallops, murmurs, rubs. Peripheral pulses are [present .No JVD .  ABDOMEN: Bowel sounds normal nontender  No guard or rebound, no hepato splenomegal no CVA tenderness.  Small fb ? umbi hernia non tender vs outie . Extremtities:  No clubbing cyanosis or edema, no acute joint swelling or redness no focal atrophy NEURO:  Oriented x3, cranial nerves 3-12 appear to be  intact, no obvious focal weakness,gait within normal limits no abnormal reflexes or asymmetrical SKIN: No acute rashes normal turgor, color, no bruising or petechiae. Scattered pink bump like bug bites  Le and one on abd no vesicle or  Blisters  PSYCH: Oriented, good eye contact, no obvious depression anxiety, cognition and judgment appear normal. LN: no cervical axillary inguinal adenopathy      Lab Results  Component Value Date   WBC 5.9 11/19/2015   HGB 14.1 11/19/2015   HCT 41.7 11/19/2015   PLT 139.0 (L) 11/19/2015   GLUCOSE 99 11/19/2015   CHOL 162 11/19/2015   TRIG 63.0 11/19/2015   HDL 62.80 11/19/2015   LDLDIRECT 165.0 12/07/2006   LDLCALC 87 11/19/2015   ALT 26 11/19/2015   AST 33 11/19/2015   NA 141 11/19/2015   K 4.3 11/19/2015   CL 105 11/19/2015   CREATININE 0.78 11/19/2015   BUN 24 (H) 11/19/2015   CO2 27 11/19/2015   TSH 1.29 11/19/2015   HGBA1C 5.5 04/23/2016   BP Readings from Last 3 Encounters:  01/13/17 110/80  04/23/16 134/72  11/29/15 138/80   21.33 kg/m  Wt Readings from Last 3 Encounters:  01/13/17 120 lb 6.4 oz (54.6 kg)  04/23/16 120 lb 3.2 oz (54.5 kg)  11/29/15 124 lb 9.6 oz (56.5 kg)     ASSESSMENT AND PLAN:  Discussed the following assessment and plan:  Essential hypertension - Plan: Basic metabolic panel, CBC with Differential/Platelet, Hemoglobin A1c, Hepatic function panel, Lipid panel  Hyperlipidemia, unspecified hyperlipidemia type - Plan: Basic metabolic panel, CBC with Differential/Platelet, Hemoglobin A1c, Hepatic function panel, Lipid panel  BREAST CANCER, HX OF - Plan: Basic metabolic panel, CBC with Differential/Platelet, Hemoglobin A1c, Hepatic function panel, Lipid panel, DG Bone Density  Medication management - Plan: Basic metabolic panel, CBC with Differential/Platelet, Hemoglobin A1c, Hepatic function panel, Lipid panel  Fasting hyperglycemia - Plan: Basic metabolic panel, CBC with Differential/Platelet, Hemoglobin  A1c, Hepatic function panel, Lipid panel  Estrogen deficiency - Plan: DG Bone Density ? dexa dsic  oprdered   Labs due get mammoam  Disc nutrition    Counseling etc . Labs today rov 6 mos follow weight etc  Eating out salads  Not a financial limitation for food  -Patient advised to return or notify health care team  if  new concerns arise.  Risk benefit of mammo and dexa disc  discussed.   Patient Instructions  Attention  to  Higher protein calories  Such as  Avocado   And beans .   Peanut butter.  Do not lose more weight   Get mammogram and bone density .  To check  Risk  For fracture  shingrix vaccine at pharmacies .  ROV in 6 months to check how you are doing     Bone Health Bones protect organs, store calcium, and anchor muscles. Good health habits, such as eating nutritious foods and exercising regularly, are important for maintaining healthy bones. They can also help to prevent a condition that causes bones to lose density and become weak and brittle (osteoporosis). Why is bone mass important? Bone mass refers to the amount of bone tissue that you have. The higher your bone mass, the stronger your bones. An important step toward having healthy bones throughout life is to have strong and dense bones during childhood. A young adult who has a high bone mass is more likely to have a high bone mass later in life. Bone mass at its greatest it is called peak bone mass. A large decline in bone mass occurs in older adults. In women, it occurs about the time of menopause. During this time, it is important to practice good health habits, because if more bone is lost than what is replaced, the bones will become less healthy and more likely to break (fracture). If you find that you have a low bone mass, you may be able to prevent osteoporosis or further bone loss by changing your diet and lifestyle. How can I find out if my bone mass is low? Bone mass can be measured with an X-ray test that is  called a bone mineral density (BMD) test. This test is recommended for all women who are age 81 or older. It may also be recommended for men who are age 60 or older, or for people who are more likely to develop osteoporosis due to:  Having bones that break easily.  Having a long-term disease that weakens bones, such as kidney disease or rheumatoid arthritis.  Having menopause earlier than normal.  Taking medicine that weakens bones, such as steroids, thyroid hormones, or hormone treatment for breast cancer or prostate cancer.  Smoking.  Drinking three or more alcoholic drinks each day.  What are the nutritional recommendations for healthy bones? To have healthy bones, you need to get enough of the right minerals and vitamins. Most nutrition experts recommend getting these nutrients from the foods that you eat. Nutritional recommendations vary from person to person. Ask your health care provider what is healthy for you. Here are some general guidelines. Calcium Recommendations Calcium is the most important (essential) mineral for bone health. Most people can get enough calcium from their diet, but supplements may be recommended for people who are at risk for osteoporosis. Good sources of calcium include:  Dairy products, such as low-fat or nonfat milk, cheese, and yogurt.  Dark green leafy vegetables, such as bok choy and broccoli.  Calcium-fortified foods, such as orange juice, cereal, bread, soy beverages, and tofu products.  Nuts, such as almonds.  Follow these recommended amounts for daily calcium intake:  Children, age 35?3: 700 mg.  Children, age 54?8: 1,000 mg.  Children, age 548?13: 1,300 mg.  Teens, age 28?18: 1,300 mg.  Adults, age 63?50: 1,000 mg.  Adults, age 549?70: ? Men: 1,000 mg. ? Women: 1,200 mg.  Adults, age 541 or older: 1,200 mg.  Pregnant and breastfeeding females: ? Teens: 1,300 mg. ? Adults:  1,000 mg.  Vitamin D Recommendations Vitamin D is the most  essential vitamin for bone health. It helps the body to absorb calcium. Sunlight stimulates the skin to make vitamin D, so be sure to get enough sunlight. If you live in a cold climate or you do not get outside often, your health care provider may recommend that you take vitamin D supplements. Good sources of vitamin D in your diet include:  Egg yolks.  Saltwater fish.  Milk and cereal fortified with vitamin D.  Follow these recommended amounts for daily vitamin D intake:  Children and teens, age 63?18: 33 international units.  Adults, age 35 or younger: 400-800 international units.  Adults, age 52 or older: 800-1,000 international units.  Other Nutrients Other nutrients for bone health include:  Phosphorus. This mineral is found in meat, poultry, dairy foods, nuts, and legumes. The recommended daily intake for adult men and adult women is 700 mg.  Magnesium. This mineral is found in seeds, nuts, dark green vegetables, and legumes. The recommended daily intake for adult men is 400?420 mg. For adult women, it is 310?320 mg.  Vitamin K. This vitamin is found in green leafy vegetables. The recommended daily intake is 120 mg for adult men and 90 mg for adult women.  What type of physical activity is best for building and maintaining healthy bones? Weight-bearing and strength-building activities are important for building and maintaining peak bone mass. Weight-bearing activities cause muscles and bones to work against gravity. Strength-building activities increases muscle strength that supports bones. Weight-bearing and muscle-building activities include:  Walking and hiking.  Jogging and running.  Dancing.  Gym exercises.  Lifting weights.  Tennis and racquetball.  Climbing stairs.  Aerobics.  Adults should get at least 30 minutes of moderate physical activity on most days. Children should get at least 60 minutes of moderate physical activity on most days. Ask your health care  provide what type of exercise is best for you. Where can I find more information? For more information, check out the following websites:  [place]: YardHomes.se  [employer] of Health: http://www.niams.AnonymousEar.fr.asp  This information is not intended to replace advice given to you by your health care provider. Make sure you discuss any questions you have with your health care provider. Document Released: 10/10/2003 Document Revised: 02/07/2016 Document Reviewed: 07/25/2014 Elsevier Interactive Patient Education  2018 [place]. [name] M.D.

## 2017-01-13 ENCOUNTER — Encounter: Payer: Self-pay | Admitting: Internal Medicine

## 2017-01-13 ENCOUNTER — Ambulatory Visit (INDEPENDENT_AMBULATORY_CARE_PROVIDER_SITE_OTHER): Payer: Medicare Other | Admitting: Internal Medicine

## 2017-01-13 VITALS — BP 110/80 | HR 71 | Temp 97.6°F | Ht 63.0 in | Wt 120.4 lb

## 2017-01-13 DIAGNOSIS — Z79899 Other long term (current) drug therapy: Secondary | ICD-10-CM | POA: Diagnosis not present

## 2017-01-13 DIAGNOSIS — I10 Essential (primary) hypertension: Secondary | ICD-10-CM | POA: Diagnosis not present

## 2017-01-13 DIAGNOSIS — Z853 Personal history of malignant neoplasm of breast: Secondary | ICD-10-CM | POA: Diagnosis not present

## 2017-01-13 DIAGNOSIS — E785 Hyperlipidemia, unspecified: Secondary | ICD-10-CM | POA: Diagnosis not present

## 2017-01-13 DIAGNOSIS — E2839 Other primary ovarian failure: Secondary | ICD-10-CM | POA: Diagnosis not present

## 2017-01-13 DIAGNOSIS — R7301 Impaired fasting glucose: Secondary | ICD-10-CM | POA: Diagnosis not present

## 2017-01-13 LAB — CBC WITH DIFFERENTIAL/PLATELET
Basophils Absolute: 0 10*3/uL (ref 0.0–0.1)
Basophils Relative: 0.7 % (ref 0.0–3.0)
EOS ABS: 0.1 10*3/uL (ref 0.0–0.7)
Eosinophils Relative: 2 % (ref 0.0–5.0)
HCT: 44.1 % (ref 36.0–46.0)
Hemoglobin: 14.6 g/dL (ref 12.0–15.0)
LYMPHS ABS: 1 10*3/uL (ref 0.7–4.0)
Lymphocytes Relative: 18.5 % (ref 12.0–46.0)
MCHC: 33.1 g/dL (ref 30.0–36.0)
MCV: 94.5 fl (ref 78.0–100.0)
Monocytes Absolute: 0.4 10*3/uL (ref 0.1–1.0)
Monocytes Relative: 8.2 % (ref 3.0–12.0)
NEUTROS PCT: 70.6 % (ref 43.0–77.0)
Neutro Abs: 3.7 10*3/uL (ref 1.4–7.7)
PLATELETS: 164 10*3/uL (ref 150.0–400.0)
RBC: 4.66 Mil/uL (ref 3.87–5.11)
RDW: 13.1 % (ref 11.5–15.5)
WBC: 5.2 10*3/uL (ref 4.0–10.5)

## 2017-01-13 LAB — HEPATIC FUNCTION PANEL
ALT: 22 U/L (ref 0–35)
AST: 19 U/L (ref 0–37)
Albumin: 4.6 g/dL (ref 3.5–5.2)
Alkaline Phosphatase: 67 U/L (ref 39–117)
BILIRUBIN DIRECT: 0.1 mg/dL (ref 0.0–0.3)
BILIRUBIN TOTAL: 0.7 mg/dL (ref 0.2–1.2)
Total Protein: 7 g/dL (ref 6.0–8.3)

## 2017-01-13 LAB — LIPID PANEL
CHOL/HDL RATIO: 3
Cholesterol: 209 mg/dL — ABNORMAL HIGH (ref 0–200)
HDL: 59.9 mg/dL (ref >39.00)
LDL CALC: 133 mg/dL — AB (ref 0–99)
NonHDL: 149.45
TRIGLYCERIDES: 83 mg/dL (ref 0.0–149.0)
VLDL: 16.6 mg/dL (ref 0.0–40.0)

## 2017-01-13 LAB — HEMOGLOBIN A1C: Hgb A1c MFr Bld: 6.1 % (ref 4.6–6.5)

## 2017-01-13 LAB — BASIC METABOLIC PANEL
BUN: 23 mg/dL (ref 6–23)
CO2: 28 meq/L (ref 19–32)
CREATININE: 0.75 mg/dL (ref 0.40–1.20)
Calcium: 9.4 mg/dL (ref 8.4–10.5)
Chloride: 106 mEq/L (ref 96–112)
GFR: 78.38 mL/min (ref >60.00)
GLUCOSE: 113 mg/dL — AB (ref 70–99)
Potassium: 4.3 mEq/L (ref 3.5–5.1)
Sodium: 141 mEq/L (ref 135–145)

## 2017-01-13 NOTE — Patient Instructions (Addendum)
Attention to  Higher protein calories  Such as  Avocado   And beans .   Peanut butter.  Do not lose more weight   Get mammogram and bone density .  To check  Risk  For fracture  shingrix vaccine at pharmacies .  ROV in 6 months to check how you are doing     Bone Health Bones protect organs, store calcium, and anchor muscles. Good health habits, such as eating nutritious foods and exercising regularly, are important for maintaining healthy bones. They can also help to prevent a condition that causes bones to lose density and become weak and brittle (osteoporosis). Why is bone mass important? Bone mass refers to the amount of bone tissue that you have. The higher your bone mass, the stronger your bones. An important step toward having healthy bones throughout life is to have strong and dense bones during childhood. A young adult who has a high bone mass is more likely to have a high bone mass later in life. Bone mass at its greatest it is called peak bone mass. A large decline in bone mass occurs in older adults. In women, it occurs about the time of menopause. During this time, it is important to practice good health habits, because if more bone is lost than what is replaced, the bones will become less healthy and more likely to break (fracture). If you find that you have a low bone mass, you may be able to prevent osteoporosis or further bone loss by changing your diet and lifestyle. How can I find out if my bone mass is low? Bone mass can be measured with an X-ray test that is called a bone mineral density (BMD) test. This test is recommended for all women who are age 57 or older. It may also be recommended for men who are age 23 or older, or for people who are more likely to develop osteoporosis due to:  Having bones that break easily.  Having a long-term disease that weakens bones, such as kidney disease or rheumatoid arthritis.  Having menopause earlier than normal.  Taking medicine that  weakens bones, such as steroids, thyroid hormones, or hormone treatment for breast cancer or prostate cancer.  Smoking.  Drinking three or more alcoholic drinks each day.  What are the nutritional recommendations for healthy bones? To have healthy bones, you need to get enough of the right minerals and vitamins. Most nutrition experts recommend getting these nutrients from the foods that you eat. Nutritional recommendations vary from person to person. Ask your health care provider what is healthy for you. Here are some general guidelines. Calcium Recommendations Calcium is the most important (essential) mineral for bone health. Most people can get enough calcium from their diet, but supplements may be recommended for people who are at risk for osteoporosis. Good sources of calcium include:  Dairy products, such as low-fat or nonfat milk, cheese, and yogurt.  Dark green leafy vegetables, such as bok choy and broccoli.  Calcium-fortified foods, such as orange juice, cereal, bread, soy beverages, and tofu products.  Nuts, such as almonds.  Follow these recommended amounts for daily calcium intake:  Children, age 354?3: 700 mg.  Children, age 35?8: 1,000 mg.  Children, age 351?13: 1,300 mg.  Teens, age 67?18: 1,300 mg.  Adults, age 50?50: 1,000 mg.  Adults, age 38?70: ? Men: 1,000 mg. ? Women: 1,200 mg.  Adults, age 7 or older: 1,200 mg.  Pregnant and breastfeeding females: ? Teens: 1,300 mg. ?  Adults: 1,000 mg.  Vitamin D Recommendations Vitamin D is the most essential vitamin for bone health. It helps the body to absorb calcium. Sunlight stimulates the skin to make vitamin D, so be sure to get enough sunlight. If you live in a cold climate or you do not get outside often, your health care provider may recommend that you take vitamin D supplements. Good sources of vitamin D in your diet include:  Egg yolks.  Saltwater fish.  Milk and cereal fortified with vitamin D.  Follow  these recommended amounts for daily vitamin D intake:  Children and teens, age 64?18: 47 international units.  Adults, age 55 or younger: 400-800 international units.  Adults, age 81 or older: 800-1,000 international units.  Other Nutrients Other nutrients for bone health include:  Phosphorus. This mineral is found in meat, poultry, dairy foods, nuts, and legumes. The recommended daily intake for adult men and adult women is 700 mg.  Magnesium. This mineral is found in seeds, nuts, dark green vegetables, and legumes. The recommended daily intake for adult men is 400?420 mg. For adult women, it is 310?320 mg.  Vitamin K. This vitamin is found in green leafy vegetables. The recommended daily intake is 120 mg for adult men and 90 mg for adult women.  What type of physical activity is best for building and maintaining healthy bones? Weight-bearing and strength-building activities are important for building and maintaining peak bone mass. Weight-bearing activities cause muscles and bones to work against gravity. Strength-building activities increases muscle strength that supports bones. Weight-bearing and muscle-building activities include:  Walking and hiking.  Jogging and running.  Dancing.  Gym exercises.  Lifting weights.  Tennis and racquetball.  Climbing stairs.  Aerobics.  Adults should get at least 30 minutes of moderate physical activity on most days. Children should get at least 60 minutes of moderate physical activity on most days. Ask your health care provide what type of exercise is best for you. Where can I find more information? For more information, check out the following websites:  [place]: YardHomes.se  [employer] of Health: http://www.niams.AnonymousEar.fr.asp  This information is not intended to replace advice given to you by your health care provider. Make sure you  discuss any questions you have with your health care provider. Document Released: 10/10/2003 Document Revised: 02/07/2016 Document Reviewed: 07/25/2014 Elsevier Interactive Patient Education  [employer].

## 2017-01-14 ENCOUNTER — Other Ambulatory Visit: Payer: Self-pay | Admitting: Emergency Medicine

## 2017-02-10 ENCOUNTER — Encounter: Payer: Self-pay | Admitting: Podiatry

## 2017-02-10 ENCOUNTER — Ambulatory Visit (INDEPENDENT_AMBULATORY_CARE_PROVIDER_SITE_OTHER): Payer: Medicare Other | Admitting: Podiatry

## 2017-02-10 DIAGNOSIS — B351 Tinea unguium: Secondary | ICD-10-CM | POA: Diagnosis not present

## 2017-02-10 NOTE — Progress Notes (Signed)
   Subjective:    Patient ID: [name] [name], female    DOB: 1931/11/07, 83 y.o.   MRN: [id number]  HPI   This patient presents today complaining of thickened deformed left hallux toenail for approximately 6 months. Patient has attempted to file it down, however, the nail remains thick and patient is not able to trim the nail any further patient requesting evaluation and treatment as indicated Patient states she is a diabetic controlled with diet denies any medication  Review of Systems  Skin:       Thick toenail on left big toe   All other systems reviewed and are negative.      Objective:   Physical Exam  Orientated 3  Vascular: No calf edema or calf tenderness bilaterally DP and PT pulses 2/4 bilaterally Capillary reflex immediate bilaterally  Neurological: Sensation to 10 g monofilament wire intact 5/5 bilaterally Vibratory sensation reactive bilaterally Ankle reflexes reactive bilaterally  Dermatological: No skin lesions bilaterally Atrophic skin without hair growth bilaterally The left hallux toenail is hypertrophic and deformed with texture and color changes Remaining toenails are elongated with occasional texture and color changes  Musculoskeletal: HAV bilaterally Manual motor testing dorsi flexion, plantar flexion 5/5 bilaterally      Assessment & Plan:   Assessment: 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 I recommended debridement of the nail with understanding the nail deformity would recur. Patient verbally consents Left hallux toenail was debrided mechanically and electrically without any bleeding Suggested use a fixed VapoRub topically  Reappoint at patient's request

## 2017-02-10 NOTE — Patient Instructions (Signed)

## 2017-02-16 ENCOUNTER — Inpatient Hospital Stay: Admission: RE | Admit: 2017-02-16 | Payer: Medicare Other | Source: Ambulatory Visit

## 2017-02-16 DIAGNOSIS — H2513 Age-related nuclear cataract, bilateral: Secondary | ICD-10-CM | POA: Diagnosis not present

## 2017-03-03 ENCOUNTER — Telehealth: Payer: Self-pay | Admitting: Internal Medicine

## 2017-03-03 NOTE — Telephone Encounter (Signed)
° ° °  Pt said she need a RX for Mastectomy Supplies and she would like to pick up the RX before Friday     [phone]

## 2017-03-03 NOTE — Telephone Encounter (Signed)
Please order r x for mastectomy supplies      Dx hx breast cancer  Left   Hx   mastectomy

## 2017-03-03 NOTE — Telephone Encounter (Signed)
Please advise 

## 2017-03-04 ENCOUNTER — Other Ambulatory Visit: Payer: Self-pay | Admitting: Emergency Medicine

## 2017-03-04 NOTE — Telephone Encounter (Signed)
Faxed prescription to [facility] [phone]

## 2017-03-04 NOTE — Telephone Encounter (Signed)
[facility] [phone]

## 2017-03-19 ENCOUNTER — Encounter: Payer: Self-pay | Admitting: Family Medicine

## 2017-03-19 ENCOUNTER — Ambulatory Visit (INDEPENDENT_AMBULATORY_CARE_PROVIDER_SITE_OTHER): Payer: Medicare Other | Admitting: Family Medicine

## 2017-03-19 VITALS — BP 120/80 | HR 98 | Temp 98.3°F | Wt 122.2 lb

## 2017-03-19 DIAGNOSIS — L309 Dermatitis, unspecified: Secondary | ICD-10-CM

## 2017-03-19 NOTE — Progress Notes (Signed)
Subjective:    Patient ID: [name] [name], female    DOB: 14-May-1932, 83 y.o.   MRN: [id number]  Chief Complaint  Patient presents with  . Acute Visit    patient has little reds bumps located from the waist and up     HPI Patient was seen today for acute complaint of rash: -noticed yesterday, on chest - erythematous, pruritic lesions on torso, but not on extremities -Pt has had similar lesions in the past on LEs.  Thought was 2/2 spandex.  Does endorse buying a new bra. -denies changes in soaps or lotions, no recent travel. -Has been in back yard, mowing grass, feeding the deer. -unsure if husband has similar lesions.  Past Medical History:  Diagnosis Date  . Adenomatous colon polyp     4 2008  . Breast cancer ([place]) 1978   left breast  . Cystitis, interstitial   . Fracture    left ankle and rt wrist  . FRACTURE, ANKLE, LEFT 12/14/2006   Qualifier: Diagnosis of  By: Hulan Saas, CMA (AAMA), [place] Skye   . FRACTURE, WRIST 12/14/2006   Qualifier: Diagnosis of  By: Hulan Saas, CMA (AAMA), Quita Skye   . Hyperlipidemia   . Hypertension   . Osteoarthritis   . Urinary incontinence     Past Surgical History:  Procedure Laterality Date  . ABDOMINAL HYSTERECTOMY  1988   fibroids  . DILATION AND CURETTAGE OF UTERUS     x 2  . MASTECTOMY, PARTIAL  1978   left  . TONSILLECTOMY    . TUBAL LIGATION  77    Family History  Problem Relation Age of Onset  . Suicidality Mother   . Heart attack Father   . Cancer - Other Daughter        throat cancer     Social History   Social History  . Marital status: Married    Spouse name: N/A  . Number of children: N/A  . Years of education: N/A   Occupational History  . Not on file.   Social History Main Topics  . Smoking status: Former Research scientist (life sciences)  . Smokeless tobacco: Never Used  . Alcohol use Yes  . Drug use: No  . Sexual activity: Not on file   Other Topics Concern  . Not on file   Social History Narrative   Retired   Married    [place] of 2   Husband with medical problems   g5 p3   Ex smoker  Quit 33 years ago  [place]    Outpatient Medications Prior to Visit  Medication Sig Dispense Refill  . amLODipine (NORVASC) 5 MG tablet TAKE ONE TABLET BY MOUTH ONCE DAILY 90 tablet 1  . Ascorbic Acid (VITAMIN C) 500 MG tablet Take 500 mg by mouth daily. 1 bid    . Calcium Carbonate-Vitamin D (CALCIUM-D PO) Take 2 tablets by mouth daily.    Marland Kitchen CINNAMON PO Take 2,000 mg by mouth daily.    . fish oil-omega-3 fatty acids 1000 MG capsule Take 2 g by mouth daily.      . Magnesium 400 MG TABS Take 2 tablets by mouth daily.    . Multiple Vitamins-Minerals (CENTRUM SILVER PO) Take by mouth.      . Vitamin D, Cholecalciferol, 1000 UNITS TABS Take 2 tablets by mouth daily.     No facility-administered medications prior to visit.     Allergies  Allergen Reactions  . Nitrofurantoin   . Sulfamethoxazole  REACTION: unspecified    ROS General: Denies fever, chills, night sweats, changes in weight, changes in appetite HEENT: Denies headaches, ear pain, changes in vision, rhinorrhea, sore throat CV: Denies CP, palpitations, SOB, orthopnea Pulm: Denies SOB, cough, wheezing GI: Denies abdominal pain, nausea, vomiting, diarrhea, constipation GU: Denies dysuria, hematuria, frequency, vaginal discharge Msk: Denies muscle cramps, joint pains Neuro: Denies weakness, numbness, tingling Skin: Denies bruising  +rash Psych: Denies depression, anxiety, hallucinations     Objective:    Blood pressure 120/80, pulse 98, temperature 98.3 F (36.8 C), temperature source Oral, weight 122 lb 3.2 oz (55.4 kg).   Gen. Pleasant, talkative, well-nourished, in no distress, normal affect.  HEENT Bath/AT, no lesions, face symmetric, no scleral icterus, PERRLA, no post nasal drip Neck: No JVD, no thyromegaly, no carotid bruits Lungs: no accessory muscle uss, CTAB, no wheezes or rales Cardiovascular: RR, heart sounds  normal, no m/r/g, no  peripheral edema Abdomen: soft and non-tender, BS normal. Musculoskeletal: No deformities, no cyanosis or clubbing, normal tone Neuro:  A&Ox3, CN II-XII intact, normal gait Skin:  Warm, dry, intact.  S/p L mastectomy.  Several scattered 5 mm circumscribed, erythematous papuled with small puncture in center located on R breast, stomach, and upper back/shoulders   Wt Readings from Last 3 Encounters:  03/19/17 122 lb 3.2 oz (55.4 kg)  01/13/17 120 lb 6.4 oz (54.6 kg)  04/23/16 120 lb 3.2 oz (54.5 kg)    Diabetic Foot Exam - Simple   No data filed     Lab Results  Component Value Date   WBC 5.2 01/13/2017   HGB 14.6 01/13/2017   HCT 44.1 01/13/2017   PLT 164.0 01/13/2017   GLUCOSE 113 (H) 01/13/2017   CHOL 209 (H) 01/13/2017   TRIG 83.0 01/13/2017   HDL 59.90 01/13/2017   LDLDIRECT 165.0 12/07/2006   LDLCALC 133 (H) 01/13/2017   ALT 22 01/13/2017   AST 19 01/13/2017   NA 141 01/13/2017   K 4.3 01/13/2017   CL 106 01/13/2017   CREATININE 0.75 01/13/2017   BUN 23 01/13/2017   CO2 28 01/13/2017   TSH 1.29 11/19/2015   HGBA1C 6.1 01/13/2017    Assessment/Plan:  Dermatitis  -Pruritic rash appears to be 2/2 insect/bug bites given punctate centers. -Pt to see if husband has a rash -Offered pt a po medication for rash such as a 2nd or 3rd gen antihistamine, but she declined.  Pt wishes to continue OTC non- drowsy bendadryl. -discussed topical medications, but not really a good choice given scattered nature of lesions. -RTC on Monday if no improvement or worsening of symptoms.

## 2017-03-19 NOTE — Patient Instructions (Addendum)
Today you were seen for a rash.  As discussed during your visit it is ok to take the non-drowsy benadryl that you have at home.  Try not to scratch the areas on your skin.  On Monday, if you feel like your rash is not improving please let us know. Rash A rash is a change in the color of the skin. A rash can also change the way your skin feels. There are many different conditions and factors that can cause a rash. Follow these instructions at home: Pay attention to any changes in your symptoms. Follow these instructions to help with your condition: Medicine Take or apply over-the-counter and prescription medicines only as told by your doctor. These may include:  Corticosteroid cream.  Anti-itch lotions.  Oral antihistamines.  Skin Care  Put cool compresses on the affected areas.  Try taking a bath with: ? Epsom salts. Follow the instructions on the packaging. You can get these at your local pharmacy or grocery store. ? Baking soda. Pour a small amount into the bath as told by your doctor. ? Colloidal oatmeal. Follow the instructions on the packaging. You can get this at your local pharmacy or grocery store.  Try putting baking soda paste onto your skin. Stir water into baking soda until it gets like a paste.  Do not scratch or rub your skin.  Avoid covering the rash. Make sure the rash is exposed to air as much as possible. General instructions  Avoid hot showers or baths, which can make itching worse. A cold shower may help.  Avoid scented soaps, detergents, and perfumes. Use gentle soaps, detergents, perfumes, and other cosmetic products.  Avoid anything that causes your rash. Keep a journal to help track what causes your rash. Write down: ? What you eat. ? What cosmetic products you use. ? What you drink. ? What you wear. This includes jewelry.  Keep all follow-up visits as told by your doctor. This is important. Contact a doctor if:  You sweat at night.  You lose  weight.  You pee (urinate) more than normal.  You feel weak.  You throw up (vomit).  Your skin or the whites of your eyes look yellow (jaundice).  Your skin: ? Tingles. ? Is numb.  Your rash: ? Does not go away after a few days. ? Gets worse.  You are: ? More thirsty than normal. ? More tired than normal.  You have: ? New symptoms. ? Pain in your belly (abdomen). ? A fever. ? Watery poop (diarrhea). Get help right away if:  Your rash covers all or most of your body. The rash may or may not be painful.  You have blisters that: ? Are on top of the rash. ? Grow larger. ? Grow together. ? Are painful. ? Are inside your nose or mouth.  You have a rash that: ? Looks like purple pinprick-sized spots all over your body. ? Has a "bull's eye" or looks like a target. ? Is red and painful, causes your skin to peel, and is not from being in the sun too long. This information is not intended to replace advice given to you by your health care provider. Make sure you discuss any questions you have with your health care provider. Document Released: 01/06/2008 Document Revised: 12/26/2015 Document Reviewed: 12/05/2014 Elsevier Interactive Patient Education  2018 [employer].

## 2017-04-13 ENCOUNTER — Other Ambulatory Visit: Payer: Self-pay | Admitting: Internal Medicine

## 2017-05-03 ENCOUNTER — Ambulatory Visit (INDEPENDENT_AMBULATORY_CARE_PROVIDER_SITE_OTHER): Payer: Medicare Other | Admitting: *Deleted

## 2017-05-03 DIAGNOSIS — Z23 Encounter for immunization: Secondary | ICD-10-CM

## 2017-07-13 ENCOUNTER — Ambulatory Visit: Payer: Medicare Other

## 2017-07-15 ENCOUNTER — Ambulatory Visit: Payer: Medicare Other

## 2017-07-19 NOTE — Progress Notes (Signed)
Subjective:   [name] [name] is a 83 y.o. female who presents for Medicare Annual (Subsequent) preventive examination.  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 Kazee is very "picky"  Chicken; fish; she eats a lot of salads as he does not like vegetables   Exercise HDL 59 Rake the yard;  Works outside;  [employer] busy; states she feels younger than her stated age Is upset that her spouse want try to "do more"     G5 P3 Quit tobacco x 83 yo;  Home is not w/c accessible  Is somewhat concerned that spouse is not as mobile  Long term care plan is not identified but plans to stay in the home for now    Will get someone to take care of the leaves and lawn for next year   Does have a church she was close too in [place] and still communicates with them  Goes to [employer] now   Discussed dysfunctional family on both sides per her report. Seems to be adjusting; recommended time for herself   Health Maintenance Due  Topic Date Due  . TETANUS/TDAP  02/07/2017   Dr. Regis Bill  request she have Dexa- will try to do this  Order still in until 04/2018   States she will have a Tdap at the pharmacy since she works in the yard alot   Colonoscopy 07/2009 aged out  [place] 04/2013 - will schedule as she has as remote hx of breast cancer        Objective:     Vitals: BP (!) 136/50   Pulse 84   Ht 5\' 3"  (1.6 m)   Wt 125 lb (56.7 kg)   SpO2 95%   BMI 22.14 kg/m   Body mass index is 22.14 kg/m.  Advanced Directives 07/20/2017 11/05/2014  Does Patient Have a Medical Advance Directive? 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 Adenomatous colon polyp     4 2008  . Breast cancer ([place]) 1978   left breast  . Cystitis, interstitial   . Fracture    left ankle and rt wrist  . FRACTURE, ANKLE, LEFT 12/14/2006   Qualifier: Diagnosis of  By: Hulan Saas, CMA (AAMA), Quita Skye   . FRACTURE, WRIST 12/14/2006   Qualifier: Diagnosis of  By: Hulan Saas, CMA (AAMA), Quita Skye   . Hyperlipidemia   . Hypertension   . Osteoarthritis   . Urinary incontinence    Past Surgical History:  Procedure Laterality Date  . ABDOMINAL HYSTERECTOMY  1988   fibroids  . DILATION AND CURETTAGE OF UTERUS     x 2  . MASTECTOMY, PARTIAL  1978   left  . TONSILLECTOMY    . TUBAL LIGATION  77   Family History  Problem  Relation Age of Onset  . Suicidality Mother   . Heart attack Father   . Cancer - Other Daughter        throat cancer    Social History   Socioeconomic History  . Marital status: Married    Spouse name: Not on file  . Number of children: Not on file  . Years of education: Not on file  . Highest education level: Not on file  Social Needs  . Financial resource strain: Not on file  . Food insecurity - worry: Not on file  . Food insecurity - inability: Not on file  . Transportation needs - medical: Not on file  . Transportation needs - non-medical: Not on file  Occupational History  . Not on file  Tobacco Use  . Smoking status: Former Research scientist (life sciences)  . Smokeless tobacco: Never Used  . 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 Alcohol use: Yes    Comment: none at this time   . Drug use: No  . Sexual activity: Not on file  Other Topics Concern  . Not on file  Social History Narrative   Retired   Married   [place] of 2   Husband with medical  problems   g5 p3   Ex smoker  Quit 33 years ago  [place]    Outpatient Encounter Medications as of 07/20/2017  Medication Sig  . amLODipine (NORVASC) 5 MG tablet TAKE 1 TABLET BY MOUTH ONCE DAILY  . Ascorbic Acid (VITAMIN C) 500 MG tablet Take 500 mg by mouth daily. 1 bid  . Calcium Carbonate-Vitamin D (CALCIUM-D PO) Take 2 tablets by mouth daily.  Marland Kitchen CINNAMON PO Take 2,000 mg by mouth daily.  . fish oil-omega-3 fatty acids 1000 MG capsule Take 2 g by mouth daily.    . Magnesium 400 MG TABS Take 2 tablets by mouth daily.  . Multiple Vitamins-Minerals (CENTRUM SILVER PO) Take by mouth.    . Vitamin D, Cholecalciferol, 1000 UNITS TABS Take 2 tablets by mouth daily.   No facility-administered encounter medications on file as of 07/20/2017.     Activities of Daily Living In your present state of health, do you have any difficulty performing the following activities: 07/20/2017  Hearing? Y  Vision? N  Difficulty concentrating or making decisions? N  Walking or climbing stairs? N  Dressing or bathing? N  Doing errands, shopping? N  Preparing Food and eating ? N  Using the Toilet? N  In the past six months, have you accidently leaked urine? N  Do you have problems with loss of bowel control? N  Managing your Medications? N  Managing your Finances? N  Housekeeping or managing your Housekeeping? N  Some recent data might be hidden    Patient Care Team: [name], Standley Brooking, MD as PCP - General Franchot Gallo, MD (Urology)    Assessment:   This is a routine wellness examination for [name] [name].  Exercise Activities and Dietary recommendations Current Exercise Habits: Home exercise routine(taking care of spouse)  Goals    . Patient Stated     Will fup on getting a hearing test        Fall Risk Fall Risk  07/20/2017 01/13/2017 11/19/2015 11/05/2014 10/27/2013  Falls in the past year? No No No No No   Prevention of falls: Remove rugs or any tripping hazards in the home Use Non  slip mats in bathtubs  and showers Placing grab bars next to the toilet and or shower Placing handrails on both sides of the stair way Adding extra lighting in the home.   Personal safety issues reviewed:  1. Consider starting a community watch program per [facility] 2.  Changes batteries is smoke detector and/or carbon monoxide detector  3.  If you have firearms; keep them in a safe place 4.  Wear protection when in the sun; Always wear sunscreen or a hat; It is good to have your doctor check your skin annually or review any new areas of concern 5. Driving safety; Keep in the right lane; stay 3 car lengths behind the car in front of you on the highway; look 3 times prior to pulling out; carry your cell phone everywhere you go!      Depression Screen PHQ 2/9 Scores 07/20/2017 01/13/2017 11/19/2015 11/05/2014  PHQ - 2 Score 0 0 0 0     Cognitive Function MMSE - Mini Mental State Exam 07/20/2017  Not completed: (No Data)    Good historian; talks a lot; denies issues with memory  Taking care of spouse     Immunization History  Administered Date(s) Administered  . Influenza Split 04/21/2011, 04/26/2012  . Influenza Whole 05/10/2008, 05/01/2009, 04/15/2010  . Influenza, High Dose Seasonal PF 05/01/2014, 05/14/2015, 04/23/2016, 05/03/2017  . Influenza,inj,Quad PF,6+ Mos 05/02/2013  . Pneumococcal Conjugate-13 10/27/2013  . Pneumococcal Polysaccharide-23 08/03/1998, 02/28/2008  . Td 08/03/1994, 02/08/2007    Qualifies for Shingles Vaccine? Shingrix is a vaccine for the prevention of Shingles in Adults 50 and older.  If you are on Medicare, you can request a prescription from your doctor to be filled at a pharmacy.  Please check with your benefits regarding applicable copays or out of pocket expenses.  The Shingrix is given in 2 vaccines approx 8 weeks apart. You must receive the 2nd dose prior to 6 months from receipt of the first.    Screening Tests Health Maintenance    Topic Date Due  . TETANUS/TDAP  02/07/2017  . DEXA SCAN  08/02/2018 (Originally 09/27/1998)  . INFLUENZA VACCINE  Completed  . PNA vac Low Risk Adult  Completed        Plan:   PCP Notes   Health Maintenance States she will fup with a DEXA as well as Mammogram  Will get her Tdap at the pharmacy   Agrees to have a mammogram this year   Abnormal Screens  A1c was 6.1 and educated regarding pre diabetes  Referrals  Reiterated she needs her dexa and mammogram and tdap   Patient concerns; Spouse's care; declined care giving groups. Agrees to try to start taking care of herself more  Good support in her church community   Nurse Concerns; As noted   Next PCP apt made today for January 18, 2018     I have personally reviewed and noted the following in the patient's chart:   . Medical and social history . Use of alcohol, tobacco or illicit drugs  . Current medications and supplements . Functional ability and status . Nutritional status . Physical activity . Advanced directives . List of other physicians . Hospitalizations, surgeries, and ER visits in previous 12 months . Vitals . Screenings to include cognitive, depression, and falls . Referrals and appointments  In addition, I have reviewed and discussed with patient certain preventive protocols, quality metrics, and best practice recommendations. A written personalized care plan for preventive services as well as general preventive health  recommendations were provided to patient.     Wynetta Fines, RN  07/20/2017

## 2017-07-20 ENCOUNTER — Ambulatory Visit (INDEPENDENT_AMBULATORY_CARE_PROVIDER_SITE_OTHER): Payer: Medicare Other

## 2017-07-20 VITALS — BP 136/50 | HR 84 | Ht 63.0 in | Wt 125.0 lb

## 2017-07-20 DIAGNOSIS — Z0000 Encounter for general adult medical examination without abnormal findings: Secondary | ICD-10-CM

## 2017-07-20 NOTE — Patient Instructions (Addendum)
[name] [name] , Thank you for taking time to come for your Medicare Wellness Visit. I appreciate your ongoing commitment to your health goals. Please review the following plan we discussed and let me know if I can assist you in the future.   Deaf & Hard of Hearing Division Services - can assist with hearing aid x 1  No reviews  [facility]  [id number]  [phone] - [name]  [email or web address]  [email or web address]  Will mammogram at the [facility]   Bone density is still due;   A Tetanus is recommended every 10 years. Medicare covers a tetanus if you have a cut or wound; otherwise, there may be a charge. If you had not had a tetanus with pertusses, known as the Tdap, you can take this anytime.   Shingrix is a vaccine for the prevention of Shingles in Adults 50 and older.  If you are on Medicare, you can request a prescription from your doctor to be filled at a pharmacy.  Please check with your benefits regarding applicable copays or out of pocket expenses.  The Shingrix is given in 2 vaccines approx 8 weeks apart. You must receive the 2nd dose prior to 6 months from receipt of the first.     Educated regarding prediabetes and numbers;  A1c ranges from 5.8 to 6.5 or fasting Blood sugar > 115 -126; (126 is diabetic)   Risk: >45yo; family hx; overweight or obese; African American; Hispanic; Latino; American [place] American; [place]; history of diabetes when pregnant; or birth to a baby weighing over 9 lbs. Being less physically active than 30 minutes; 3 times a week;   Prevention; Losing a modest 7 to 8 lbs; If over 200 lbs; 10 to 14 lbs;  Choose healthier foods; colorful veggies; fish or lean meats; drinks water Reduce portion size Start exercising; 30 minutes of fast walking x 30 minutes per day/ 60 min for weight loss   Results for [name], [name]  (MRN [id number]) as of 07/20/2017 11:04  Ref. Range 11/05/2014 11:29 11/19/2015 10:44 04/23/2016 11:47 01/13/2017 10:17  Glucose Latest Ref Range: 70 - 99 mg/dL 103 (H) 99  113 (H)  Hemoglobin A1C Latest Ref Range: 4.6 - 6.5 % 6.1 6.1 5.5 6.1        These are the goals we discussed: Goals    None      This is a list of the screening recommended for you and due dates:  Health Maintenance  Topic Date Due  . DEXA scan (bone density measurement)  09/27/1998  . Tetanus Vaccine  02/07/2017  . Flu Shot  Completed  . Pneumonia vaccines  Completed     Bone Densitometry Bone densitometry is an imaging test that uses a special X-ray to measure the amount of calcium and other minerals in your bones (bone density). This test is also known as a bone mineral density test or dual-energy X-ray absorptiometry (DXA). The test can measure bone density at your hip and your spine. It is similar to having a regular X-ray. You may have this test to:  Diagnose a condition that causes weak or thin bones (osteoporosis).  Predict your risk of a broken bone (fracture).  Determine how well osteoporosis treatment is working.  Tell a health care provider about:  Any allergies you have.  All medicines you are taking, including vitamins, herbs, eye drops, creams, and over-the-counter medicines.  Any problems you or family members have had with  anesthetic medicines.  Any blood disorders you have.  Any surgeries you have had.  Any medical conditions you have.  Possibility of pregnancy.  Any other medical test you had within the previous 14 days that used contrast material. What are the risks? Generally, this is a safe procedure. However, problems can occur and may include the following:  This test exposes you to a very small amount of radiation.  The risks of radiation exposure may be greater to unborn children.  What happens before the procedure?  Do not take any calcium supplements for 24 hours  before having the test. You can otherwise eat and drink what you usually do.  Take off all metal jewelry, eyeglasses, dental appliances, and any other metal objects. What happens during the procedure?  You may lie on an exam table. There will be an X-ray generator below you and an imaging device above you.  Other devices, such as boxes or braces, may be used to position your body properly for the scan.  You will need to lie still while the machine slowly scans your body.  The images will show up on a computer monitor. What happens after the procedure? You may need more testing at a later time. This information is not intended to replace advice given to you by your health care provider. Make sure you discuss any questions you have with your health care provider. Document Released: 08/11/2004 Document Revised: 12/26/2015 Document Reviewed: 12/28/2013 Elsevier Interactive Patient Education  2018 [place] for Type 2 Diabetes A screening test for type 2 diabetes (type 2 diabetes mellitus) is a blood test to measure your blood sugar (glucose) level. This test is done to check for early signs of diabetes, before you develop symptoms. Type 2 diabetes is a long-term (chronic) disease that occurs when the pancreas does not make enough of a hormone called insulin. This results in high blood glucose levels, which can cause many complications. You may be screened for type 2 diabetes as part of your regular health care, especially if you have a high risk for diabetes. Screening can help identify type 2 diabetes at its early stage (prediabetes). Identifying and treating prediabetes may delay or prevent development of type 2 diabetes. What are the risk factors for type 2 diabetes? The following factors may make you more likely to develop type 2 diabetes:  Having a parent or sibling (first-degree relative) who has diabetes.  Being overweight or obese.  Being of American-Indian, [place], Hispanic, Latino, Asian, or African-American descent.  Not getting enough exercise.  Being older than 84.  Having a history of diabetes during pregnancy (gestational diabetes).  Having low levels of good cholesterol (HDL-C) or high levels of blood fats (triglycerides).  Having high blood glucose in a previous blood test.  Having high blood pressure.  Having certain diseases or conditions, including: ? Acanthosis nigricans. This is a condition that causes dark skin on the neck, armpits, and groin. ? Polycystic ovary syndrome (PCOS). ? Heart disease.  Having delivered a baby who weighed more than 9 lb (4.1 kg).  Who should be screened for type 2 diabetes? Adults  Adults age 81 and older. These adults should be screened at least once every three years.  Adults who are younger than 47, overweight, and have at least one other risk factor. These adults should be screened at least once every three years.  Adults who have normal blood glucose levels and two or more risk factors. These  adults may be screened once every year (annually).  Women who have had gestational diabetes in the past. These women should be screened at least once every three years.  Pregnant women who have risk factors. These women should be screened at their first prenatal visit.  Pregnant women with no risk factors. These women should be screened between weeks 24 and 28 of pregnancy. Children and adolescents  Children and adolescents should be screened for type 2 diabetes if they are overweight and have 2 of the following risk factors: ? A family history of type 2 diabetes. ? Being a member of a high risk race or ethnic group. ? Signs of insulin resistance or conditions associated with insulin resistance. ? A mother who had gestational diabetes while pregnant with him or her.  Screening should be done at least once every three years, starting at age 53. Your health care provider or your child's  health care provider may recommend having a screening more or less often. What happens during screening? During screening, your health care provider may ask questions about:  Your health and your risk factors, including your activity level and any medical conditions that you have.  The health of your first-degree relatives.  Past pregnancies, if this applies.  Your health care provider will also do a physical exam, including a blood pressure measurement and blood tests. There are four blood tests that can be used to screen for type 2 diabetes. You may have one or more of the following:  A fasting plasma glucose test (FBG). You will not be allowed to eat for at least eight hours before a blood sample is taken.  A random blood glucose test. This test checks your blood glucose at any time of the day regardless of when you ate.  An oral glucose tolerance test (OGTT). This test measures your blood glucose at two times: ? After you have not eaten (have fasted) overnight. ? Two hours after you drink a glucose-containing beverage. A diagnosis can be made if the level is greater than 200 mg/dL.  An A1c test. This test provides information about blood glucose control over the previous three months.  What do the results mean? Your test results are a measurement of how much glucose is in your blood. Normal blood glucose levels mean that you do not have diabetes or prediabetes. High blood glucose levels may mean that you have prediabetes or diabetes. Depending on the results, other tests may be needed to confirm the diagnosis. This information is not intended to replace advice given to you by your health care provider. Make sure you discuss any questions you have with your health care provider. Document Released: 05/16/2009 Document Revised: 12/26/2015 Document Reviewed: 05/17/2015 Elsevier Interactive Patient Education  2017 [place] Prevention in the Home Falls can cause injuries.  They can happen to people of all ages. There are many things you can do to make your home safe and to help prevent falls. What can I do on the outside of my home?  Regularly fix the edges of walkways and driveways and fix any cracks.  Remove anything that might make you trip as you walk through a door, such as a raised step or threshold.  Trim any bushes or trees on the path to your home.  Use bright outdoor lighting.  Clear any walking paths of anything that might make someone trip, such as rocks or tools.  Regularly check to see if handrails are loose or broken.  Make sure that both sides of any steps have handrails.  Any raised decks and porches should have guardrails on the edges.  Have any leaves, snow, or ice cleared regularly.  Use sand or salt on walking paths during winter.  Clean up any spills in your garage right away. This includes oil or grease spills. What can I do in the bathroom?  Use night lights.  Install grab bars by the toilet and in the tub and shower. Do not use towel bars as grab bars.  Use non-skid mats or decals in the tub or shower.  If you need to sit down in the shower, use a plastic, non-slip stool.  Keep the floor dry. Clean up any water that spills on the floor as soon as it happens.  Remove soap buildup in the tub or shower regularly.  Attach bath mats securely with double-sided non-slip rug tape.  Do not have throw rugs and other things on the floor that can make you trip. What can I do in the bedroom?  Use night lights.  Make sure that you have a light by your bed that is easy to reach.  Do not use any sheets or blankets that are too big for your bed. They should not hang down onto the floor.  Have a firm chair that has side arms. You can use this for support while you get dressed.  Do not have throw rugs and other things on the floor that can make you trip. What can I do in the kitchen?  Clean up any spills right away.  Avoid  walking on wet floors.  Keep items that you use a lot in easy-to-reach places.  If you need to reach something above you, use a strong step stool that has a grab bar.  Keep electrical cords out of the way.  Do not use floor polish or wax that makes floors slippery. If you must use wax, use non-skid floor wax.  Do not have throw rugs and other things on the floor that can make you trip. What can I do with my stairs?  Do not leave any items on the stairs.  Make sure that there are handrails on both sides of the stairs and use them. Fix handrails that are broken or loose. Make sure that handrails are as long as the stairways.  Check any carpeting to make sure that it is firmly attached to the stairs. Fix any carpet that is loose or worn.  Avoid having throw rugs at the top or bottom of the stairs. If you do have throw rugs, attach them to the floor with carpet tape.  Make sure that you have a light switch at the top of the stairs and the bottom of the stairs. If you do not have them, ask someone to add them for you. What else can I do to help prevent falls?  Wear shoes that: ? Do not have high heels. ? Have rubber bottoms. ? Are comfortable and fit you well. ? Are closed at the toe. Do not wear sandals.  If you use a stepladder: ? Make sure that it is fully opened. Do not climb a closed stepladder. ? Make sure that both sides of the stepladder are locked into place. ? Ask someone to hold it for you, if possible.  Clearly mark and make sure that you can see: ? Any grab bars or handrails. ? First and last steps. ? Where the edge of each step is.  Use tools that help you move around (mobility aids) if they are needed. These include: ? Canes. ? Walkers. ? Scooters. ? Crutches.  Turn on the lights when you go into a dark area. Replace any light bulbs as soon as they burn out.  Set up your furniture so you have a clear path. Avoid moving your furniture around.  If any of your  floors are uneven, fix them.  If there are any pets around you, be aware of where they are.  Review your medicines with your doctor. Some medicines can make you feel dizzy. This can increase your chance of falling. Ask your doctor what other things that you can do to help prevent falls. This information is not intended to replace advice given to you by your health care provider. Make sure you discuss any questions you have with your health care provider. Document Released: 05/16/2009 Document Revised: 12/26/2015 Document Reviewed: 08/24/2014 Elsevier Interactive Patient Education  2018 [place] Maintenance, Female Adopting a healthy lifestyle and getting preventive care can go a long way to promote health and wellness. Talk with your health care provider about what schedule of regular examinations is right for you. This is a good chance for you to check in with your provider about disease prevention and staying healthy. In between checkups, there are plenty of things you can do on your own. Experts have done a lot of research about which lifestyle changes and preventive measures are most likely to keep you healthy. Ask your health care provider for more information. Weight and diet Eat a healthy diet  Be sure to include plenty of vegetables, fruits, low-fat dairy products, and lean protein.  Do not eat a lot of foods high in solid fats, added sugars, or salt.  Get regular exercise. This is one of the most important things you can do for your health. ? Most adults should exercise for at least 150 minutes each week. The exercise should increase your heart rate and make you sweat (moderate-intensity exercise). ? Most adults should also do strengthening exercises at least twice a week. This is in addition to the moderate-intensity exercise.  Maintain a healthy weight  Body mass index (BMI) is a measurement that can be used to identify possible weight problems. It estimates body  fat based on height and weight. Your health care provider can help determine your BMI and help you achieve or maintain a healthy weight.  For females 35 years of age and older: ? A BMI below 18.5 is considered underweight. ? A BMI of 18.5 to 24.9 is normal. ? A BMI of 25 to 29.9 is considered overweight. ? A BMI of 30 and above is considered obese.  Watch levels of cholesterol and blood lipids  You should start having your blood tested for lipids and cholesterol at 81 years of age, then have this test every 5 years.  You may need to have your cholesterol levels checked more often if: ? Your lipid or cholesterol levels are high. ? You are older than 81 years of age. ? You are at high risk for heart disease.  Cancer screening Lung Cancer  Lung cancer screening is recommended for adults 45-73 years old who are at high risk for lung cancer because of a history of smoking.  A yearly low-dose CT scan of the lungs is recommended for people who: ? Currently smoke. ? Have quit within the past 15 years. ? Have at least a 30-pack-year history of smoking.  A pack year is smoking an average of one pack of cigarettes a day for 1 year.  Yearly screening should continue until it has been 15 years since you quit.  Yearly screening should stop if you develop a health problem that would prevent you from having lung cancer treatment.  Breast Cancer  Practice breast self-awareness. This means understanding how your breasts normally appear and feel.  It also means doing regular breast self-exams. Let your health care provider know about any changes, no matter how small.  If you are in your 20s or 30s, you should have a clinical breast exam (CBE) by a health care provider every 1-3 years as part of a regular health exam.  If you are 63 or older, have a CBE every year. Also consider having a breast X-ray (mammogram) every year.  If you have a family history of breast cancer, talk to your health care  provider about genetic screening.  If you are at high risk for breast cancer, talk to your health care provider about having an MRI and a mammogram every year.  Breast cancer gene (BRCA) assessment is recommended for women who have family members with BRCA-related cancers. BRCA-related cancers include: ? Breast. ? Ovarian. ? Tubal. ? Peritoneal cancers.  Results of the assessment will determine the need for genetic counseling and BRCA1 and BRCA2 testing.  Cervical Cancer Your health care provider may recommend that you be screened regularly for cancer of the pelvic organs (ovaries, uterus, and vagina). This screening involves a pelvic examination, including checking for microscopic changes to the surface of your cervix (Pap test). You may be encouraged to have this screening done every 3 years, beginning at age 37.  For women ages 44-65, health care providers may recommend pelvic exams and Pap testing every 3 years, or they may recommend the Pap and pelvic exam, combined with testing for human papilloma virus (HPV), every 5 years. Some types of HPV increase your risk of cervical cancer. Testing for HPV may also be done on women of any age with unclear Pap test results.  Other health care providers may not recommend any screening for nonpregnant women who are considered low risk for pelvic cancer and who do not have symptoms. Ask your health care provider if a screening pelvic exam is right for you.  If you have had past treatment for cervical cancer or a condition that could lead to cancer, you need Pap tests and screening for cancer for at least 20 years after your treatment. If Pap tests have been discontinued, your risk factors (such as having a new sexual partner) need to be reassessed to determine if screening should resume. Some women have medical problems that increase the chance of getting cervical cancer. In these cases, your health care provider may recommend more frequent screening and  Pap tests.  Colorectal Cancer  This type of cancer can be detected and often prevented.  Routine colorectal cancer screening usually begins at 81 years of age and continues through 81 years of age.  Your health care provider may recommend screening at an earlier age if you have risk factors for colon cancer.  Your health care provider may also recommend using home test kits to check for hidden blood in the stool.  A small camera at the end of a tube can be used to examine your colon directly (sigmoidoscopy or colonoscopy). This is done to check for the earliest forms of colorectal cancer.  Routine screening usually begins at age  50.  Direct examination of the colon should be repeated every 5-10 years through 81 years of age. However, you may need to be screened more often if early forms of precancerous polyps or small growths are found.  Skin Cancer  Check your skin from head to toe regularly.  Tell your health care provider about any new moles or changes in moles, especially if there is a change in a mole's shape or color.  Also tell your health care provider if you have a mole that is larger than the size of a pencil eraser.  Always use sunscreen. Apply sunscreen liberally and repeatedly throughout the day.  Protect yourself by wearing long sleeves, pants, a wide-brimmed hat, and sunglasses whenever you are outside.  Heart disease, diabetes, and high blood pressure  High blood pressure causes heart disease and increases the risk of stroke. High blood pressure is more likely to develop in: ? People who have blood pressure in the high end of the normal range (130-139/85-89 mm Hg). ? People who are overweight or obese. ? People who are African American.  If you are 15-74 years of age, have your blood pressure checked every 3-5 years. If you are 3 years of age or older, have your blood pressure checked every year. You should have your blood pressure measured twice-once when you  are at a hospital or clinic, and once when you are not at a hospital or clinic. Record the average of the two measurements. To check your blood pressure when you are not at a hospital or clinic, you can use: ? An automated blood pressure machine at a pharmacy. ? A home blood pressure monitor.  If you are between 92 years and 48 years old, ask your health care provider if you should take aspirin to prevent strokes.  Have regular diabetes screenings. This involves taking a blood sample to check your fasting blood sugar level. ? If you are at a normal weight and have a low risk for diabetes, have this test once every three years after 81 years of age. ? If you are overweight and have a high risk for diabetes, consider being tested at a younger age or more often. Preventing infection Hepatitis B  If you have a higher risk for hepatitis B, you should be screened for this virus. You are considered at high risk for hepatitis B if: ? You were born in a country where hepatitis B is common. Ask your health care provider which countries are considered high risk. ? Your parents were born in a high-risk country, and you have not been immunized against hepatitis B (hepatitis B vaccine). ? You have HIV or AIDS. ? You use needles to inject street drugs. ? You live with someone who has hepatitis B. ? You have had sex with someone who has hepatitis B. ? You get hemodialysis treatment. ? You take certain medicines for conditions, including cancer, organ transplantation, and autoimmune conditions.  Hepatitis C  Blood testing is recommended for: ? Everyone born from [birth year] through 1963. ? Anyone with known risk factors for hepatitis C.  Sexually transmitted infections (STIs)  You should be screened for sexually transmitted infections (STIs) including gonorrhea and chlamydia if: ? You are sexually active and are younger than 81 years of age. ? You are older than 81 years of age and your health care provider  tells you that you are at risk for this type of infection. ? Your sexual activity has changed since you were last  screened and you are at an increased risk for chlamydia or gonorrhea. Ask your health care provider if you are at risk.  If you do not have HIV, but are at risk, it may be recommended that you take a prescription medicine daily to prevent HIV infection. This is called pre-exposure prophylaxis (PrEP). You are considered at risk if: ? You are sexually active and do not regularly use condoms or know the HIV status of your partner(s). ? You take drugs by injection. ? You are sexually active with a partner who has HIV.  Talk with your health care provider about whether you are at high risk of being infected with HIV. If you choose to begin PrEP, you should first be tested for HIV. You should then be tested every 3 months for as long as you are taking PrEP. Pregnancy  If you are premenopausal and you may become pregnant, ask your health care provider about preconception counseling.  If you may become pregnant, take 400 to 800 micrograms (mcg) of folic acid every day.  If you want to prevent pregnancy, talk to your health care provider about birth control (contraception). Osteoporosis and menopause  Osteoporosis is a disease in which the bones lose minerals and strength with aging. This can result in serious bone fractures. Your risk for osteoporosis can be identified using a bone density scan.  If you are 81 years of age or older, or if you are at risk for osteoporosis and fractures, ask your health care provider if you should be screened.  Ask your health care provider whether you should take a calcium or vitamin D supplement to lower your risk for osteoporosis.  Menopause may have certain physical symptoms and risks.  Hormone replacement therapy may reduce some of these symptoms and risks. Talk to your health care provider about whether hormone replacement therapy is right for  you. Follow these instructions at home:  Schedule regular health, dental, and eye exams.  Stay current with your immunizations.  Do not use any tobacco products including cigarettes, chewing tobacco, or electronic cigarettes.  If you are pregnant, do not drink alcohol.  If you are breastfeeding, limit how much and how often you drink alcohol.  Limit alcohol intake to no more than 1 drink per day for nonpregnant women. One drink equals 12 ounces of beer, 5 ounces of wine, or 1 ounces of hard liquor.  Do not use street drugs.  Do not share needles.  Ask your health care provider for help if you need support or information about quitting drugs.  Tell your health care provider if you often feel depressed.  Tell your health care provider if you have ever been abused or do not feel safe at home. This information is not intended to replace advice given to you by your health care provider. Make sure you discuss any questions you have with your health care provider. Document Released: 02/02/2011 Document Revised: 12/26/2015 Document Reviewed: 04/23/2015 Elsevier Interactive Patient Education  2018 [employer].   Hearing Loss Hearing loss is a partial or total loss of the ability to hear. This can be temporary or permanent, and it can happen in one or both ears. Hearing loss may be referred to as deafness. Medical care is necessary to treat hearing loss properly and to prevent the condition from getting worse. Your hearing may partially or completely come back, depending on what caused your hearing loss and how severe it is. In some cases, hearing loss is permanent. What  are the causes? Common causes of hearing loss include:  Too much wax in the ear canal.  Infection of the ear canal or middle ear.  Fluid in the middle ear.  Injury to the ear or surrounding area.  An object stuck in the ear.  Prolonged exposure to loud sounds, such as music.  Less common causes of hearing loss  include:  Tumors in the ear.  Viral or bacterial infections, such as meningitis.  A hole in the eardrum (perforated eardrum).  Problems with the hearing nerve that sends signals between the brain and the ear.  Certain medicines.  What are the signs or symptoms? Symptoms of this condition may include:  Difficulty telling the difference between sounds.  Difficulty following a conversation when there is background noise.  Lack of response to sounds in your environment. This may be most noticeable when you do not respond to startling sounds.  Needing to turn up the volume on the television, radio, etc.  Ringing in the ears.  Dizziness.  Pain in the ears.  How is this diagnosed? This condition is diagnosed based on a physical exam and a hearing test (audiometry). The audiometry test will be performed by a hearing specialist (audiologist). You may also be referred to an ear, nose, and throat (ENT) specialist (otolaryngologist). How is this treated? Treatment for recent onset of hearing loss may include:  Ear wax removal.  Being prescribed medicines to prevent infection (antibiotics).  Being prescribed medicines to reduce inflammation (corticosteroids).  Follow these instructions at home:  If you were prescribed an antibiotic medicine, take it as told by your health care provider. Do not stop taking the antibiotic even if you start to feel better.  Take over-the-counter and prescription medicines only as told by your health care provider.  Avoid loud noises.  Return to your normal activities as told by your health care provider. Ask your health care provider what activities are safe for you.  Keep all follow-up visits as told by your health care provider. This is important. Contact a health care provider if:  You feel dizzy.  You develop new symptoms.  You vomit or feel nauseous.  You have a fever. Get help right away if:  You develop sudden changes in your  vision.  You have severe ear pain.  You have new or increased weakness.  You have a severe headache. This information is not intended to replace advice given to you by your health care provider. Make sure you discuss any questions you have with your health care provider. Document Released: 07/20/2005 Document Revised: 12/26/2015 Document Reviewed: 12/05/2014 Elsevier Interactive Patient Education  2018 [employer].

## 2017-07-20 NOTE — Progress Notes (Signed)
Lucretia Kern., DO Signed in PCP absence. Will forward to PCP as well.

## 2017-09-20 ENCOUNTER — Ambulatory Visit (INDEPENDENT_AMBULATORY_CARE_PROVIDER_SITE_OTHER): Payer: Medicare Other | Admitting: Internal Medicine

## 2017-09-20 ENCOUNTER — Encounter: Payer: Self-pay | Admitting: Internal Medicine

## 2017-09-20 VITALS — BP 118/78 | HR 64 | Temp 97.9°F | Wt 124.4 lb

## 2017-09-20 DIAGNOSIS — B029 Zoster without complications: Secondary | ICD-10-CM

## 2017-09-20 MED ORDER — TRAMADOL HCL 50 MG PO TABS: 50.0000 mg | ORAL_TABLET | Freq: Three times a day (TID) | ORAL | 0 refills | Status: DC | PRN

## 2017-09-20 MED ORDER — VALACYCLOVIR HCL 1 G PO TABS: 1000.0000 mg | ORAL_TABLET | Freq: Three times a day (TID) | ORAL | 0 refills | Status: DC

## 2017-09-20 NOTE — Progress Notes (Signed)
Chief Complaint  Patient presents with  . Herpes Zoster    sores/rash on left leg and left buttocks. blisters.     HPI: [name] [name] 84 y.o.  SDA   Rash late Friday .  2 15  But didn't want to go to ed   Began right backside and now down leg    Past knee    Some blisters  Itch and are sore   No bowel or bladder  Changes .  No fever  Weakness  Husband with eye issues  Has ?s Taking benadryl to help sleep no  Sx change otherwise    Caution with ibu has taking  400 mg    ROS: See pertinent positives and negatives per HPI.  Past Medical History:  Diagnosis Date  . Adenomatous colon polyp     4 2008  . Breast cancer ([place]) 1978   left breast  . Cystitis, interstitial   . Fracture    left ankle and rt wrist  . FRACTURE, ANKLE, LEFT 12/14/2006   Qualifier: Diagnosis of  By: Hulan Saas, CMA (AAMA), [place]   . FRACTURE, WRIST 12/14/2006   Qualifier: Diagnosis of  By: Hulan Saas, CMA (AAMA), [place]   . Hyperlipidemia   . Hypertension   . Osteoarthritis   . Urinary incontinence     Family History  Problem Relation Age of Onset  . Suicidality Mother   . Heart attack Father   . Cancer - Other Daughter        throat cancer     Social History   Socioeconomic History  . Marital status: Married    Spouse name: None  . Number of children: None  . Years of education: None  . Highest education level: None  Social Needs  . Financial resource strain: None  . Food insecurity - worry: None  . Food insecurity - inability: None  . Transportation needs - medical: None  . Transportation needs - non-medical: None  Occupational History  . None  Tobacco Use  . Smoking status: Former Research scientist (life sciences)  . Smokeless tobacco: Never Used  . 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 Alcohol use: Yes    Comment: none at this time   . Drug use: No  . Sexual activity: None  Other Topics Concern  . None  Social History  Narrative   Retired   Married   [place] of 2   Husband with medical problems   g5 p3   Ex smoker  Quit 33 years ago  [place]    Outpatient Medications Prior to Visit  Medication Sig Dispense Refill  . amLODipine (NORVASC) 5 MG tablet TAKE 1 TABLET BY MOUTH ONCE DAILY 90 tablet 1  . Ascorbic Acid (VITAMIN C) 500 MG tablet Take 500 mg by mouth daily. 1 bid    . Calcium Carbonate-Vitamin D (CALCIUM-D PO) Take 2 tablets by mouth daily.    Marland Kitchen CINNAMON PO Take 2,000 mg by mouth daily.    . fish oil-omega-3 fatty acids 1000 MG capsule Take 2 g by mouth daily.      . Magnesium 400 MG TABS Take 2 tablets by mouth daily.    . Multiple Vitamins-Minerals (CENTRUM SILVER PO) Take by mouth.      . Vitamin D, Cholecalciferol, 1000 UNITS TABS Take 2 tablets by mouth daily.     No facility-administered medications prior to visit.  EXAM:  BP 118/78 (BP Location: Right Arm, Patient Position: Sitting, Cuff Size: Normal)   Pulse 64   Temp 97.9 F (36.6 C) (Oral)   Wt 124 lb 6.4 oz (56.4 kg)   BMI 22.04 kg/m   Body mass index is 22.04 kg/m.  GENERAL: vitals reviewed and listed above, alert, oriented, appears well hydrated and in no acute distress gait nl  HEENT: atraumatic, conjunctiva  clear, no obvious abnormalities on inspection of external nose and ears   left buttocks  With patches of vesicles and  Down left thigh wrapped around ant and  Distal leg  Some are  coalsced  No pustyles   Blotchy .  Gait  No sig  Change looks nl  MS: moves all extremities without noticeable focal  abnormality PSYCH: pleasant and cooperative, no obvious depression or anxiety  ASSESSMENT AND PLAN:  Discussed the following assessment and plan:  Herpes zoster without complication   - left leg sx extensive herpetic looking dont think bacterial involvement   at this time not asking for pain med  rx printed for tramadol given if neededmore   Expectant management.   -Patient advised to return or notify health care  team  if symptoms worsen ,persist or new concerns arise.  Patient Instructions  Take antiviral medicine .   Tylenol or ibuprofen 400 mg  Twice a day if needed   We can send in  Pain medication  If needed.        Shingles Shingles, which is also known as herpes zoster, is an infection that causes a painful skin rash and fluid-filled blisters. Shingles is not related to genital herpes, which is a sexually transmitted infection. Shingles only develops in people who:  Have had chickenpox.  Have received the chickenpox vaccine. (This is rare.)  What are the causes? Shingles is caused by varicella-zoster virus (VZV). This is the same virus that causes chickenpox. After exposure to VZV, the virus stays in the body in an inactive (dormant) state. Shingles develops if the virus reactivates. This can happen many years after the initial exposure to VZV. It is not known what causes this virus to reactivate. What increases the risk? People who have had chickenpox or received the chickenpox vaccine are at risk for shingles. Infection is more common in people who:  Are older than age 42.  Have a weakened defense (immune) system, such as those with HIV, AIDS, or cancer.  Are taking medicines that weaken the immune system, such as transplant medicines.  Are under great stress.  What are the signs or symptoms? Early symptoms of this condition include itching, tingling, and pain in an area on your skin. Pain may be described as burning, stabbing, or throbbing. A few days or weeks after symptoms start, a painful red rash appears, usually on one side of the body in a bandlike or beltlike pattern. The rash eventually turns into fluid-filled blisters that break open, scab over, and dry up in about 2-3 weeks. At any time during the infection, you may also develop:  A fever.  Chills.  A headache.  An upset stomach.  How is this diagnosed? This condition is diagnosed with a skin exam. Sometimes,  skin or fluid samples are taken from the blisters before a diagnosis is made. These samples are examined under a microscope or sent to a lab for testing. How is this treated? There is no specific cure for this condition. Your health care provider will probably prescribe medicines to help  you manage pain, recover more quickly, and avoid long-term problems. Medicines may include:  Antiviral drugs.  Anti-inflammatory drugs.  Pain medicines.  If the area involved is on your face, you may be referred to a specialist, such as an eye doctor (ophthalmologist) or an ear, nose, and throat (ENT) doctor to help you avoid eye problems, chronic pain, or disability. Follow these instructions at home: Medicines  Take medicines only as directed by your health care provider.  Apply an anti-itch or numbing cream to the affected area as directed by your health care provider. Blister and Rash Care  Take a cool bath or apply cool compresses to the area of the rash or blisters as directed by your health care provider. This may help with pain and itching.  Keep your rash covered with a loose bandage (dressing). Wear loose-fitting clothing to help ease the pain of material rubbing against the rash.  Keep your rash and blisters clean with mild soap and cool water or as directed by your health care provider.  Check your rash every day for signs of infection. These include redness, swelling, and pain that lasts or increases.  Do not pick your blisters.  Do not scratch your rash. General instructions  Rest as directed by your health care provider.  Keep all follow-up visits as directed by your health care provider. This is important.  Until your blisters scab over, your infection can cause chickenpox in people who have never had it or been vaccinated against it. To prevent this from happening, avoid contact with other people, especially: ? Babies. ? Pregnant women. ? Children who have eczema. ? Elderly  people who have transplants. ? People who have chronic illnesses, such as leukemia or AIDS. Contact a health care provider if:  Your pain is not relieved with prescribed medicines.  Your pain does not get better after the rash heals.  Your rash looks infected. Signs of infection include redness, swelling, and pain that lasts or increases. Get help right away if:  The rash is on your face or nose.  You have facial pain, pain around your eye area, or loss of feeling on one side of your face.  You have ear pain or you have ringing in your ear.  You have loss of taste.  Your condition gets worse. This information is not intended to replace advice given to you by your health care provider. Make sure you discuss any questions you have with your health care provider. Document Released: 07/20/2005 Document Revised: 03/15/2016 Document Reviewed: 05/31/2014 Elsevier Interactive Patient Education  2018 [employer]. [name] M.D.

## 2017-09-20 NOTE — Patient Instructions (Addendum)
Take antiviral medicine .   Tylenol or ibuprofen 400 mg  Twice a day if needed   We can send in  Pain medication  If needed.        Shingles Shingles, which is also known as herpes zoster, is an infection that causes a painful skin rash and fluid-filled blisters. Shingles is not related to genital herpes, which is a sexually transmitted infection. Shingles only develops in people who:  Have had chickenpox.  Have received the chickenpox vaccine. (This is rare.)  What are the causes? Shingles is caused by varicella-zoster virus (VZV). This is the same virus that causes chickenpox. After exposure to VZV, the virus stays in the body in an inactive (dormant) state. Shingles develops if the virus reactivates. This can happen many years after the initial exposure to VZV. It is not known what causes this virus to reactivate. What increases the risk? People who have had chickenpox or received the chickenpox vaccine are at risk for shingles. Infection is more common in people who:  Are older than age 50.  Have a weakened defense (immune) system, such as those with HIV, AIDS, or cancer.  Are taking medicines that weaken the immune system, such as transplant medicines.  Are under great stress.  What are the signs or symptoms? Early symptoms of this condition include itching, tingling, and pain in an area on your skin. Pain may be described as burning, stabbing, or throbbing. A few days or weeks after symptoms start, a painful red rash appears, usually on one side of the body in a bandlike or beltlike pattern. The rash eventually turns into fluid-filled blisters that break open, scab over, and dry up in about 2-3 weeks. At any time during the infection, you may also develop:  A fever.  Chills.  A headache.  An upset stomach.  How is this diagnosed? This condition is diagnosed with a skin exam. Sometimes, skin or fluid samples are taken from the blisters before a diagnosis is made. These  samples are examined under a microscope or sent to a lab for testing. How is this treated? There is no specific cure for this condition. Your health care provider will probably prescribe medicines to help you manage pain, recover more quickly, and avoid long-term problems. Medicines may include:  Antiviral drugs.  Anti-inflammatory drugs.  Pain medicines.  If the area involved is on your face, you may be referred to a specialist, such as an eye doctor (ophthalmologist) or an ear, nose, and throat (ENT) doctor to help you avoid eye problems, chronic pain, or disability. Follow these instructions at home: Medicines  Take medicines only as directed by your health care provider.  Apply an anti-itch or numbing cream to the affected area as directed by your health care provider. Blister and Rash Care  Take a cool bath or apply cool compresses to the area of the rash or blisters as directed by your health care provider. This may help with pain and itching.  Keep your rash covered with a loose bandage (dressing). Wear loose-fitting clothing to help ease the pain of material rubbing against the rash.  Keep your rash and blisters clean with mild soap and cool water or as directed by your health care provider.  Check your rash every day for signs of infection. These include redness, swelling, and pain that lasts or increases.  Do not pick your blisters.  Do not scratch your rash. General instructions  Rest as directed by your health care provider.  Keep all follow-up visits as directed by your health care provider. This is important.  Until your blisters scab over, your infection can cause chickenpox in people who have never had it or been vaccinated against it. To prevent this from happening, avoid contact with other people, especially: ? Babies. ? Pregnant women. ? Children who have eczema. ? Elderly people who have transplants. ? People who have chronic illnesses, such as leukemia or  AIDS. Contact a health care provider if:  Your pain is not relieved with prescribed medicines.  Your pain does not get better after the rash heals.  Your rash looks infected. Signs of infection include redness, swelling, and pain that lasts or increases. Get help right away if:  The rash is on your face or nose.  You have facial pain, pain around your eye area, or loss of feeling on one side of your face.  You have ear pain or you have ringing in your ear.  You have loss of taste.  Your condition gets worse. This information is not intended to replace advice given to you by your health care provider. Make sure you discuss any questions you have with your health care provider. Document Released: 07/20/2005 Document Revised: 03/15/2016 Document Reviewed: 05/31/2014 Elsevier Interactive Patient Education  2018 [employer].

## 2017-10-07 ENCOUNTER — Telehealth: Payer: Self-pay | Admitting: *Deleted

## 2017-10-07 NOTE — Telephone Encounter (Signed)
Copied from [place] [id number]. Topic: Inquiry >> Oct 07, 2017 12:48 PM Oliver Pila B wrote: Reason for CRM: pt called and is asking about her shingles b/c she is experiencing discomfort still and wanted to speak to someone about it, contact pt to advise

## 2017-10-08 MED ORDER — TRAMADOL HCL 50 MG PO TABS: 50.0000 mg | ORAL_TABLET | Freq: Three times a day (TID) | ORAL | 0 refills | Status: DC | PRN

## 2017-10-08 NOTE — Telephone Encounter (Signed)
Yes please send in a   refill the tramadol and  If not improving after a another week we can try other  meds for pain

## 2017-10-08 NOTE — Telephone Encounter (Signed)
Rx called in. Husband aware of this and recommendations.  Nothing further needed.

## 2017-10-08 NOTE — Telephone Encounter (Signed)
Pt states that the blisters have all healed up but one spot and scabs are all cleared up.  Pt is having residual pain on her left side/ribs. Pt states that the pain/burning sensation worsened when the blisters cleared up. Pt states that hit hurts to walk around. Pt states that she is refilling her Tramadol and is going to take that for the pain. Any other recommendations?

## 2017-10-22 ENCOUNTER — Other Ambulatory Visit: Payer: Self-pay | Admitting: Internal Medicine

## 2017-12-03 DIAGNOSIS — M25519 Pain in unspecified shoulder: Secondary | ICD-10-CM | POA: Diagnosis not present

## 2017-12-03 DIAGNOSIS — M25511 Pain in right shoulder: Secondary | ICD-10-CM | POA: Diagnosis not present

## 2017-12-10 DIAGNOSIS — M25511 Pain in right shoulder: Secondary | ICD-10-CM | POA: Diagnosis not present

## 2017-12-13 DIAGNOSIS — S46091D Other injury of muscle(s) and tendon(s) of the rotator cuff of right shoulder, subsequent encounter: Secondary | ICD-10-CM | POA: Diagnosis not present

## 2017-12-17 ENCOUNTER — Encounter (HOSPITAL_COMMUNITY): Payer: Self-pay

## 2017-12-17 ENCOUNTER — Encounter (HOSPITAL_COMMUNITY)
Admission: RE | Admit: 2017-12-17 | Discharge: 2017-12-17 | Disposition: A | Discharge status: Home / Self Care | Payer: Medicare Other | Source: Ambulatory Visit | Attending: Orthopedic Surgery | Admitting: Orthopedic Surgery

## 2017-12-17 ENCOUNTER — Other Ambulatory Visit: Payer: Self-pay

## 2017-12-17 DIAGNOSIS — Z01812 Encounter for preprocedural laboratory examination: Secondary | ICD-10-CM | POA: Diagnosis not present

## 2017-12-17 DIAGNOSIS — I493 Ventricular premature depolarization: Secondary | ICD-10-CM | POA: Diagnosis not present

## 2017-12-17 DIAGNOSIS — Z01818 Encounter for other preprocedural examination: Secondary | ICD-10-CM | POA: Insufficient documentation

## 2017-12-17 HISTORY — DX: Adverse effect of unspecified anesthetic, initial encounter: T41.45XA

## 2017-12-17 HISTORY — DX: Other specified postprocedural states: R11.2

## 2017-12-17 HISTORY — DX: Other specified postprocedural states: Z98.890

## 2017-12-17 HISTORY — DX: Unspecified rotator cuff tear or rupture of right shoulder, not specified as traumatic: M75.101

## 2017-12-17 HISTORY — DX: Other complications of anesthesia, initial encounter: T88.59XA

## 2017-12-17 HISTORY — DX: Zoster without complications: B02.9

## 2017-12-17 LAB — CBC
HCT: 45.1 % (ref 36.0–46.0)
Hemoglobin: 14.8 g/dL (ref 12.0–15.0)
MCH: 31.1 pg (ref 26.0–34.0)
MCHC: 32.8 g/dL (ref 30.0–36.0)
MCV: 94.7 fL (ref 78.0–100.0)
PLATELETS: 171 10*3/uL (ref 150–400)
RBC: 4.76 MIL/uL (ref 3.87–5.11)
RDW: 13.6 % (ref 11.5–15.5)
WBC: 7.9 10*3/uL (ref 4.0–10.5)

## 2017-12-17 LAB — BASIC METABOLIC PANEL
Anion gap: 8 (ref 5–15)
BUN: 29 mg/dL — AB (ref 6–20)
CO2: 23 mmol/L (ref 22–32)
CREATININE: 0.98 mg/dL (ref 0.44–1.00)
Calcium: 9.2 mg/dL (ref 8.9–10.3)
Chloride: 110 mmol/L (ref 101–111)
GFR calc Af Amer: 60 mL/min — ABNORMAL LOW (ref >60)
GFR, EST NON AFRICAN AMERICAN: 52 mL/min — AB (ref >60)
Glucose, Bld: 124 mg/dL — ABNORMAL HIGH (ref 65–99)
Potassium: 4.6 mmol/L (ref 3.5–5.1)
SODIUM: 141 mmol/L (ref 135–145)

## 2017-12-17 NOTE — Pre-Procedure Instructions (Signed)
[name] [name]  12/17/2017      [id number] - [place], [place] - [address] AT [address] [place] [address] Phone: [phone] Fax: [phone]    Your procedure is scheduled on Thurs., Dec 23, 2017 from 10:15AM- 1:09PM  Report to [facility] Admitting Entrance "A" at 8:15AM  Call this number if you have problems the morning of surgery:  [phone]   Remember:  No food after midnight on May 22  You may drink Clear Liquids until 3 hours (7:15AM) prior to surgery .  Clear liquids allowed are: Water, Juice (non-citric and without pulp), Carbonated beverages, Clear Tea, Black Coffee only, Plain Jell-O only, Gatorade and Plain Popsicles only    Take these medicines the morning of surgery with A SIP OF WATER: AmLODipine (NORVASC). If needed Loratadine (CLARITIN) for allergies  As of today, stop taking all Other Aspirin Products, Vitamins, Fish oils, and Herbal medications. Also stop all NSAIDS i.e. Advil, Ibuprofen, Motrin, Aleve, Anaprox, Naproxen, BC and Goody Powders.    Do not wear jewelry, make-upr  or nail polish (fingers).  Do not wear lotions, powders, or perfumes, or deodorant.  Do not shave 48 hours prior to surgery.    Do not bring valuables to the hospital.  [facility] is not responsible for any belongings or valuables.  Contacts, dentures or bridgework may not be worn into surgery.  Leave your suitcase in the car.  After surgery it may be brought to your room.  For patients admitted to the hospital, discharge time will be determined by your treatment team.  Patients discharged the day of surgery will not be allowed to drive home.   Special instructions:   [facility]- Preparing For Surgery  Before surgery, you can play an important role. Because skin is not sterile, your skin needs to be as free of germs as possible. You can reduce the number of germs on your skin by washing with CHG  (chlorahexidine gluconate) Soap before surgery.  CHG is an antiseptic cleaner which kills germs and bonds with the skin to continue killing germs even after washing.  Oral Hygiene is also important to reduce your risk of infection.  Remember - BRUSH YOUR TEETH THE MORNING OF SURGERY  Please do not use if you have an allergy to CHG or antibacterial soaps. If your skin becomes reddened/irritated stop using the CHG.  Do not shave (including legs and underarms) for at least 48 hours prior to first CHG shower. It is OK to shave your face.  Please follow these instructions carefully.   1. Shower the NIGHT BEFORE SURGERY and the MORNING OF SURGERY with CHG.   2. If you chose to wash your hair, wash your hair first as usual with your normal shampoo.  3. After you shampoo, rinse your hair and body thoroughly to remove the shampoo.  4. Use CHG as you would any other liquid soap. You can apply CHG directly to the skin and wash gently with a scrungie or a clean washcloth.   5. Apply the CHG Soap to your body ONLY FROM THE NECK DOWN.  Do not use on open wounds or open sores. Avoid contact with your eyes, ears, mouth and genitals (private parts). Wash Face and genitals (private parts)  with your normal soap.  6. Wash thoroughly, paying special attention to the area where your surgery will be performed.  7. Thoroughly rinse your body with warm water from  the neck down.  8. DO NOT shower/wash with your normal soap after using and rinsing off the CHG Soap.  9. Pat yourself dry with a CLEAN TOWEL.  10. Wear CLEAN PAJAMAS to bed the night before surgery, wear comfortable clothes the morning of surgery  11. Place CLEAN SHEETS on your bed the night of your first shower and DO NOT SLEEP WITH PETS.  Day of Surgery:  Do not apply any deodorants/lotions.  Please wear clean clothes to the hospital/surgery center.   Remember to brush your teeth.    Please read over the following fact sheets that you were  given. Pain Booklet, Coughing and Deep Breathing and Surgical Site Infection Prevention

## 2017-12-17 NOTE — Progress Notes (Signed)
PCP - Dr. Idell Pickles  Cardiologist - Denies  Chest x-ray - Denies  EKG - 12/17/17  Stress Test - Denies  ECHO - Denies  Cardiac Cath - Denies  Sleep Study - Denies CPAP - None  LABS- 12/17/17: CBC, BMP  ASA- Denies   Anesthesia- No  Pt denies having chest pain, sob, or fever at this time. All instructions explained to the pt, with a verbal understanding of the material. Pt agrees to go over the instructions while at home for a better understanding. The opportunity to ask questions was provided.

## 2017-12-23 ENCOUNTER — Ambulatory Visit (HOSPITAL_COMMUNITY)
Admission: RE | Admit: 2017-12-23 | Discharge: 2017-12-23 | Disposition: A | Discharge status: Home / Self Care | Payer: Medicare Other | Source: Ambulatory Visit | Attending: Orthopedic Surgery | Admitting: Orthopedic Surgery

## 2017-12-23 ENCOUNTER — Ambulatory Visit (HOSPITAL_COMMUNITY): Payer: Medicare Other | Admitting: Certified Registered Nurse Anesthetist

## 2017-12-23 ENCOUNTER — Encounter (HOSPITAL_COMMUNITY): Payer: Self-pay

## 2017-12-23 ENCOUNTER — Encounter (HOSPITAL_COMMUNITY)
Admission: RE | Disposition: A | Discharge status: Home / Self Care | Payer: Self-pay | Source: Ambulatory Visit | Attending: Orthopedic Surgery

## 2017-12-23 DIAGNOSIS — G8918 Other acute postprocedural pain: Secondary | ICD-10-CM | POA: Diagnosis not present

## 2017-12-23 DIAGNOSIS — M7541 Impingement syndrome of right shoulder: Secondary | ICD-10-CM | POA: Diagnosis not present

## 2017-12-23 DIAGNOSIS — S46811A Strain of other muscles, fascia and tendons at shoulder and upper arm level, right arm, initial encounter: Secondary | ICD-10-CM | POA: Insufficient documentation

## 2017-12-23 DIAGNOSIS — M19011 Primary osteoarthritis, right shoulder: Secondary | ICD-10-CM | POA: Diagnosis not present

## 2017-12-23 DIAGNOSIS — M94211 Chondromalacia, right shoulder: Secondary | ICD-10-CM | POA: Insufficient documentation

## 2017-12-23 DIAGNOSIS — E785 Hyperlipidemia, unspecified: Secondary | ICD-10-CM | POA: Diagnosis not present

## 2017-12-23 DIAGNOSIS — Z79899 Other long term (current) drug therapy: Secondary | ICD-10-CM | POA: Diagnosis not present

## 2017-12-23 DIAGNOSIS — S43431A Superior glenoid labrum lesion of right shoulder, initial encounter: Secondary | ICD-10-CM | POA: Diagnosis not present

## 2017-12-23 DIAGNOSIS — Z87891 Personal history of nicotine dependence: Secondary | ICD-10-CM | POA: Insufficient documentation

## 2017-12-23 DIAGNOSIS — M75101 Unspecified rotator cuff tear or rupture of right shoulder, not specified as traumatic: Secondary | ICD-10-CM | POA: Diagnosis not present

## 2017-12-23 DIAGNOSIS — S46211A Strain of muscle, fascia and tendon of other parts of biceps, right arm, initial encounter: Secondary | ICD-10-CM | POA: Diagnosis not present

## 2017-12-23 DIAGNOSIS — S46011D Strain of muscle(s) and tendon(s) of the rotator cuff of right shoulder, subsequent encounter: Secondary | ICD-10-CM

## 2017-12-23 DIAGNOSIS — S46011A Strain of muscle(s) and tendon(s) of the rotator cuff of right shoulder, initial encounter: Secondary | ICD-10-CM | POA: Insufficient documentation

## 2017-12-23 DIAGNOSIS — X58XXXA Exposure to other specified factors, initial encounter: Secondary | ICD-10-CM | POA: Insufficient documentation

## 2017-12-23 DIAGNOSIS — S43491A Other sprain of right shoulder joint, initial encounter: Secondary | ICD-10-CM | POA: Insufficient documentation

## 2017-12-23 DIAGNOSIS — Z853 Personal history of malignant neoplasm of breast: Secondary | ICD-10-CM | POA: Insufficient documentation

## 2017-12-23 DIAGNOSIS — I10 Essential (primary) hypertension: Secondary | ICD-10-CM | POA: Insufficient documentation

## 2017-12-23 DIAGNOSIS — M75121 Complete rotator cuff tear or rupture of right shoulder, not specified as traumatic: Secondary | ICD-10-CM | POA: Diagnosis not present

## 2017-12-23 HISTORY — PX: SHOULDER ARTHROSCOPY WITH ROTATOR CUFF REPAIR AND SUBACROMIAL DECOMPRESSION: SHX5686

## 2017-12-23 HISTORY — PX: SHOULDER ARTHROSCOPY WITH DISTAL CLAVICLE RESECTION: SHX5675

## 2017-12-23 SURGERY — SHOULDER ARTHROSCOPY WITH ROTATOR CUFF REPAIR AND SUBACROMIAL DECOMPRESSION
Anesthesia: General | Site: Shoulder | Laterality: Right

## 2017-12-23 MED ORDER — PROPOFOL 10 MG/ML IV BOLUS
INTRAVENOUS | Status: AC
  Filled 2017-12-23: qty 20

## 2017-12-23 MED ORDER — BUPIVACAINE LIPOSOME 1.3 % IJ SUSP: INTRAMUSCULAR | Status: DC | PRN

## 2017-12-23 MED ORDER — ONDANSETRON HCL 4 MG/2ML IJ SOLN
INTRAMUSCULAR | Status: AC
  Filled 2017-12-23: qty 2

## 2017-12-23 MED ORDER — NAPROXEN 500 MG PO TABS: 500.0000 mg | ORAL_TABLET | Freq: Two times a day (BID) | ORAL | 1 refills | Status: DC

## 2017-12-23 MED ORDER — PHENYLEPHRINE HCL 10 MG/ML IJ SOLN
INTRAVENOUS | Status: DC | PRN
  Administered 2017-12-23: 10 ug/min via INTRAVENOUS

## 2017-12-23 MED ORDER — ONDANSETRON HCL 4 MG/2ML IJ SOLN
INTRAMUSCULAR | Status: DC | PRN
  Administered 2017-12-23: 4 mg via INTRAVENOUS

## 2017-12-23 MED ORDER — ROCURONIUM BROMIDE 10 MG/ML (PF) SYRINGE
PREFILLED_SYRINGE | INTRAVENOUS | Status: AC
  Filled 2017-12-23: qty 5

## 2017-12-23 MED ORDER — LIDOCAINE 2% (20 MG/ML) 5 ML SYRINGE
INTRAMUSCULAR | Status: AC
  Filled 2017-12-23: qty 5

## 2017-12-23 MED ORDER — HYDROMORPHONE HCL 2 MG/ML IJ SOLN: 0.2500 mg | INTRAMUSCULAR | Status: DC | PRN

## 2017-12-23 MED ORDER — OXYCODONE-ACETAMINOPHEN 5-325 MG PO TABS: 1.0000 | ORAL_TABLET | ORAL | 0 refills | Status: DC | PRN

## 2017-12-23 MED ORDER — CEFAZOLIN SODIUM-DEXTROSE 2-4 GM/100ML-% IV SOLN
2.0000 g | INTRAVENOUS | Status: AC
  Administered 2017-12-23: 2 g via INTRAVENOUS

## 2017-12-23 MED ORDER — MIDAZOLAM HCL 2 MG/2ML IJ SOLN
1.0000 mg | Freq: Once | INTRAMUSCULAR | Status: AC
  Administered 2017-12-23: 1 mg via INTRAVENOUS

## 2017-12-23 MED ORDER — TRAMADOL HCL 50 MG PO TABS: 50.0000 mg | ORAL_TABLET | Freq: Four times a day (QID) | ORAL | 0 refills | Status: DC | PRN

## 2017-12-23 MED ORDER — ONDANSETRON HCL 4 MG/2ML IJ SOLN: 4.0000 mg | Freq: Once | INTRAMUSCULAR | Status: DC | PRN

## 2017-12-23 MED ORDER — PROPOFOL 10 MG/ML IV BOLUS
INTRAVENOUS | Status: DC | PRN
  Administered 2017-12-23: 100 mg via INTRAVENOUS

## 2017-12-23 MED ORDER — CEFAZOLIN SODIUM-DEXTROSE 2-4 GM/100ML-% IV SOLN
INTRAVENOUS | Status: AC
  Filled 2017-12-23: qty 100

## 2017-12-23 MED ORDER — MIDAZOLAM HCL 2 MG/2ML IJ SOLN
INTRAMUSCULAR | Status: AC
  Administered 2017-12-23: 1 mg via INTRAVENOUS
  Filled 2017-12-23: qty 2

## 2017-12-23 MED ORDER — LIDOCAINE 2% (20 MG/ML) 5 ML SYRINGE
INTRAMUSCULAR | Status: DC | PRN
  Administered 2017-12-23: 50 mg via INTRAVENOUS

## 2017-12-23 MED ORDER — BUPIVACAINE LIPOSOME 1.3 % IJ SUSP
INTRAMUSCULAR | Status: DC | PRN
  Administered 2017-12-23: 10 mL via PERINEURAL

## 2017-12-23 MED ORDER — FENTANYL CITRATE (PF) 100 MCG/2ML IJ SOLN
50.0000 ug | Freq: Once | INTRAMUSCULAR | Status: AC
  Administered 2017-12-23: 50 ug via INTRAVENOUS

## 2017-12-23 MED ORDER — CHLORHEXIDINE GLUCONATE 4 % EX LIQD: 60.0000 mL | Freq: Once | CUTANEOUS | Status: DC

## 2017-12-23 MED ORDER — MEPERIDINE HCL 50 MG/ML IJ SOLN: 6.2500 mg | INTRAMUSCULAR | Status: DC | PRN

## 2017-12-23 MED ORDER — BUPIVACAINE-EPINEPHRINE (PF) 0.5% -1:200000 IJ SOLN
INTRAMUSCULAR | Status: DC | PRN
  Administered 2017-12-23: 15 mL

## 2017-12-23 MED ORDER — ONDANSETRON HCL 4 MG PO TABS: 4.0000 mg | ORAL_TABLET | Freq: Three times a day (TID) | ORAL | 0 refills | Status: DC | PRN

## 2017-12-23 MED ORDER — DEXAMETHASONE SODIUM PHOSPHATE 10 MG/ML IJ SOLN
INTRAMUSCULAR | Status: DC | PRN
  Administered 2017-12-23: 4 mg via INTRAVENOUS

## 2017-12-23 MED ORDER — ROCURONIUM BROMIDE 10 MG/ML (PF) SYRINGE
PREFILLED_SYRINGE | INTRAVENOUS | Status: DC | PRN
  Administered 2017-12-23: 50 mg via INTRAVENOUS

## 2017-12-23 MED ORDER — DEXAMETHASONE SODIUM PHOSPHATE 10 MG/ML IJ SOLN
INTRAMUSCULAR | Status: AC
  Filled 2017-12-23: qty 1

## 2017-12-23 MED ORDER — FENTANYL CITRATE (PF) 100 MCG/2ML IJ SOLN
INTRAMUSCULAR | Status: AC
  Administered 2017-12-23: 50 ug via INTRAVENOUS
  Filled 2017-12-23: qty 2

## 2017-12-23 MED ORDER — SUGAMMADEX SODIUM 200 MG/2ML IV SOLN
INTRAVENOUS | Status: AC
  Filled 2017-12-23: qty 2

## 2017-12-23 MED ORDER — PHENYLEPHRINE 40 MCG/ML (10ML) SYRINGE FOR IV PUSH (FOR BLOOD PRESSURE SUPPORT)
PREFILLED_SYRINGE | INTRAVENOUS | Status: AC
  Filled 2017-12-23: qty 10

## 2017-12-23 MED ORDER — PHENYLEPHRINE 40 MCG/ML (10ML) SYRINGE FOR IV PUSH (FOR BLOOD PRESSURE SUPPORT)
PREFILLED_SYRINGE | INTRAVENOUS | Status: DC | PRN
  Administered 2017-12-23 (×4): 40 ug via INTRAVENOUS

## 2017-12-23 MED ORDER — FENTANYL CITRATE (PF) 250 MCG/5ML IJ SOLN
INTRAMUSCULAR | Status: AC
  Filled 2017-12-23: qty 5

## 2017-12-23 MED ORDER — STERILE WATER FOR IRRIGATION IR SOLN
Status: DC | PRN
  Administered 2017-12-23: 100 mL

## 2017-12-23 MED ORDER — FENTANYL CITRATE (PF) 250 MCG/5ML IJ SOLN
INTRAMUSCULAR | Status: DC | PRN
  Administered 2017-12-23: 125 ug via INTRAVENOUS

## 2017-12-23 MED ORDER — SODIUM CHLORIDE 0.9 % IR SOLN
Status: DC | PRN
  Administered 2017-12-23 (×4): 6000 mL

## 2017-12-23 MED ORDER — LACTATED RINGERS IV SOLN
INTRAVENOUS | Status: DC
  Administered 2017-12-23: 08:00:00 via INTRAVENOUS

## 2017-12-23 MED ORDER — SODIUM CHLORIDE 0.9 % IJ SOLN
INTRAMUSCULAR | Status: AC
  Filled 2017-12-23: qty 10

## 2017-12-23 MED ORDER — TIZANIDINE HCL 4 MG PO CAPS: 4.0000 mg | ORAL_CAPSULE | Freq: Three times a day (TID) | ORAL | 1 refills | Status: DC | PRN

## 2017-12-23 SURGICAL SUPPLY — 77 items
ANCH SUT SWLK 19.1X4.75 VT (Anchor) ×2 IMPLANT
ANCH SUT SWLK 19.1X5.5 CLS EL (Anchor) ×2 IMPLANT
ANCH SUT SWLK 19.1X6.25 CLS (Anchor) ×1 IMPLANT
ANCHOR PEEK 4.75X19.1 SWLK C (Anchor) ×4 IMPLANT
ANCHOR PEEK SWIVEL LOCK 5.5 (Anchor) ×4 IMPLANT
ANCHOR SUT SWIVELLOK BIO (Anchor) ×2 IMPLANT
BLADE CUTTER GATOR 3.5 (BLADE) ×1 IMPLANT
BLADE GREAT WHITE 4.2 (BLADE) ×1 IMPLANT
BLADE GREAT WHITE 4.2MM (BLADE)
BLADE SURG 11 STRL SS (BLADE) ×3 IMPLANT
BOOTCOVER CLEANROOM LRG (PROTECTIVE WEAR) ×6 IMPLANT
BUR OVAL 4.0 (BURR) ×1 IMPLANT
BURR OVAL 8 FLU 4.0MM X 13CM (MISCELLANEOUS) ×1
BURR OVAL 8 FLU 4.0X13 (MISCELLANEOUS) ×1 IMPLANT
CANISTER OMNI JUG 16 LITER (MISCELLANEOUS) ×3 IMPLANT
CANISTER SUCT LVC 12 LTR MEDI- (MISCELLANEOUS) ×3 IMPLANT
CANNULA ACUFLEX KIT 5X76 (CANNULA) ×1 IMPLANT
CANNULA DRILOCK 5.0MMX75MM (CANNULA) ×1
CANNULA DRILOCK 5.0X75 (CANNULA) ×2 IMPLANT
CANNULA TWIST IN 8.25X7CM (CANNULA) ×2 IMPLANT
CLOSURE STERI-STRIP 1/2X4 (GAUZE/BANDAGES/DRESSINGS) ×1
CLOSURE WOUND 1/2 X4 (GAUZE/BANDAGES/DRESSINGS)
CLSR STERI-STRIP ANTIMIC 1/2X4 (GAUZE/BANDAGES/DRESSINGS) ×1 IMPLANT
CONNECTOR 5 IN 1 STRAIGHT STRL (MISCELLANEOUS) ×3 IMPLANT
DISSECTOR  3.8MM X 13CM (MISCELLANEOUS) ×2
DISSECTOR 3.8MM X 13CM (MISCELLANEOUS) IMPLANT
DRAPE INCISE 23X17 IOBAN STRL (DRAPES)
DRAPE INCISE 23X17 STRL (DRAPES) IMPLANT
DRAPE INCISE IOBAN 23X17 STRL (DRAPES) IMPLANT
DRAPE INCISE IOBAN 66X45 STRL (DRAPES) ×3 IMPLANT
DRAPE ORTHO SPLIT 77X108 STRL (DRAPES) ×6
DRAPE STERI 35X30 U-POUCH (DRAPES) ×3 IMPLANT
DRAPE SURG 17X11 SM STRL (DRAPES) ×3 IMPLANT
DRAPE SURG ORHT 6 SPLT 77X108 (DRAPES) ×2 IMPLANT
DRAPE U-SHAPE 47X51 STRL (DRAPES) ×3 IMPLANT
DRSG PAD ABDOMINAL 8X10 ST (GAUZE/BANDAGES/DRESSINGS) ×2 IMPLANT
DURAPREP 26ML APPLICATOR (WOUND CARE) ×6 IMPLANT
EXCALIBUR 3.8MM X 13CM (MISCELLANEOUS) ×2 IMPLANT
GAUZE SPONGE 4X4 12PLY STRL (GAUZE/BANDAGES/DRESSINGS) ×3 IMPLANT
GLOVE BIO SURGEON STRL SZ7.5 (GLOVE) ×5 IMPLANT
GLOVE BIO SURGEON STRL SZ8 (GLOVE) ×3 IMPLANT
GLOVE EUDERMIC 7 POWDERFREE (GLOVE) ×3 IMPLANT
GLOVE SS BIOGEL STRL SZ 7.5 (GLOVE) ×1 IMPLANT
GLOVE SUPERSENSE BIOGEL SZ 7.5 (GLOVE) ×2
GOWN STRL REUS W/ TWL LRG LVL3 (GOWN DISPOSABLE) ×1 IMPLANT
GOWN STRL REUS W/ TWL XL LVL3 (GOWN DISPOSABLE) ×2 IMPLANT
GOWN STRL REUS W/TWL LRG LVL3 (GOWN DISPOSABLE) ×6
GOWN STRL REUS W/TWL XL LVL3 (GOWN DISPOSABLE) ×6
IV NS IRRIG 3000ML ARTHROMATIC (IV SOLUTION) ×18 IMPLANT
KIT BASIN OR (CUSTOM PROCEDURE TRAY) ×3 IMPLANT
KIT SHOULDER TRACTION (DRAPES) ×3 IMPLANT
KIT TURNOVER KIT B (KITS) ×3 IMPLANT
MANIFOLD NEPTUNE II (INSTRUMENTS) ×3 IMPLANT
NDL SCORPION MULTI FIRE (NEEDLE) IMPLANT
NDL SPNL 18GX3.5 QUINCKE PK (NEEDLE) ×1 IMPLANT
NEEDLE SCORPION MULTI FIRE (NEEDLE) ×3 IMPLANT
NEEDLE SPNL 18GX3.5 QUINCKE PK (NEEDLE) ×3 IMPLANT
NS IRRIG 1000ML POUR BTL (IV SOLUTION) ×3 IMPLANT
PACK SHOULDER (CUSTOM PROCEDURE TRAY) ×3 IMPLANT
PAD ABD 8X10 STRL (GAUZE/BANDAGES/DRESSINGS) ×4 IMPLANT
PAD ARMBOARD 7.5X6 YLW CONV (MISCELLANEOUS) ×6 IMPLANT
PROBE APOLLO 90XL (SURGICAL WAND) ×2 IMPLANT
SET ARTHROSCOPY TUBING (MISCELLANEOUS) ×3
SET ARTHROSCOPY TUBING LN (MISCELLANEOUS) ×1 IMPLANT
SLING ARM FOAM STRAP LRG (SOFTGOODS) IMPLANT
SLING ARM FOAM STRAP MED (SOFTGOODS) ×1 IMPLANT
SPONGE LAP 4X18 X RAY DECT (DISPOSABLE) IMPLANT
STRIP CLOSURE SKIN 1/2X4 (GAUZE/BANDAGES/DRESSINGS) ×1 IMPLANT
SUT MNCRL AB 3-0 PS2 18 (SUTURE) ×3 IMPLANT
SUT TIGER TAPE 7 IN WHITE (SUTURE) IMPLANT
SYR 20CC LL (SYRINGE) IMPLANT
TAPE FIBER 2MM 7IN #2 BLUE (SUTURE) IMPLANT
TAPE PAPER 3X10 WHT MICROPORE (GAUZE/BANDAGES/DRESSINGS) ×3 IMPLANT
TOWEL OR 17X24 6PK STRL BLUE (TOWEL DISPOSABLE) ×1 IMPLANT
TOWEL OR 17X26 10 PK STRL BLUE (TOWEL DISPOSABLE) ×3 IMPLANT
WAND SUCTION MAX 4MM 90S (SURGICAL WAND) ×1 IMPLANT
WATER STERILE IRR 1000ML POUR (IV SOLUTION) ×3 IMPLANT

## 2017-12-23 NOTE — Anesthesia Preprocedure Evaluation (Signed)
Anesthesia Evaluation  Patient identified by MRN, date of birth, ID band Patient awake    Reviewed: Allergy & Precautions, NPO status , Patient's Chart, lab work & pertinent test results  History of Anesthesia Complications (+) PONV  Airway Mallampati: I  TM Distance: >3 FB Neck ROM: Full    Dental   Pulmonary former smoker,    Pulmonary exam normal        Cardiovascular hypertension, Pt. on medications Normal cardiovascular exam     Neuro/Psych    GI/Hepatic   Endo/Other    Renal/GU      Musculoskeletal   Abdominal   Peds  Hematology   Anesthesia Other Findings   Reproductive/Obstetrics                             Anesthesia Physical Anesthesia Plan  ASA: II  Anesthesia Plan: General   Post-op Pain Management:  Regional for Post-op pain   Induction:   PONV Risk Score and Plan: 4 or greater and Ondansetron, Midazolam and Treatment may vary due to age or medical condition  Airway Management Planned: Oral ETT  Additional Equipment:   Intra-op Plan:   Post-operative Plan: Extubation in OR  Informed Consent: I have reviewed the patients History and Physical, chart, labs and discussed the procedure including the risks, benefits and alternatives for the proposed anesthesia with the patient or authorized representative who has indicated his/her understanding and acceptance.     Plan Discussed with: CRNA and Surgeon  Anesthesia Plan Comments:         Anesthesia Quick Evaluation

## 2017-12-23 NOTE — Progress Notes (Signed)
Orthopedic Tech Progress Note Patient Details:  [name] [name] 1931/12/04 [id number]  Ortho Devices Type of Ortho Device: Shoulder abduction pillow       [name] [name] 12/23/2017, 11:21 AM

## 2017-12-23 NOTE — Op Note (Signed)
12/23/2017  12:21 PM  PATIENT:   [name] [name]  84 y.o. female  PRE-OPERATIVE DIAGNOSIS:  Traumatic Right rotator cuff tear, AC joint arthritis,impingement  POST-OPERATIVE DIAGNOSIS:  Same with bicep tear and labral tear and chondromalacia  PROCEDURE:  RSA, bicep tenotomy, labral debridement, chondrondroplasty, SAD, DCR, RCR  SURGEON:  Jetta Murray, Metta Clines M.D.  ASSISTANTS: Shuford pac   ANESTHESIA:   GET + ISB  EBL: min  SPECIMEN:  none  Drains: none   PATIENT DISPOSITION:  PACU - hemodynamically stable.    PLAN OF CARE: Discharge to home after PACU   Brief history:  Ms. Adorno has been seen for acute right shoulder pain and profound loss of active shoulder motion after a fall with clinical examination demonstrating inability to elevate the right arm an MRI scan confirming a very large retracted acute tear of the rotator cuff.  Due to her severe loss of mobility and function as well as pain and radiographic findings we discussed with her various treatment options to include rotator cuff repair.  Possible surgical complications are reviewed including possibility of bleeding, infection, neurovascular injury, persistent pain, anesthetic complication, failure of rotator cuff repair and/or healing, and possible need for additional surgery are reviewed.  Considering her high level function very active lifestyle she is in agreement with plan for rotator cuff repair of the right shoulder  Procedure in detail  Patient was evaluated in the preop holding area and had an interscalene block established by the anesthesia department using Exparel.  Brought to the operating placed supine on the operative table and underwent smooth induction of a general endotracheal anesthesia.  Turned to the left lateral decubitus position on a beanbag and appropriate padding protected.  Prophylactic antibiotics were given.  Right shoulder girdle was sterilely prepped and draped in standard fashion.  Timeout was  called.  Examination under anesthesia revealed moderate restriction mobility gentleman ablation was performed with palpable lateral release of adhesions of all motion was achieved.  The ablation was performed prior to prepping and draping at this point we did go ahead and prepped and draped in standard fashion.  A posterior portal was established and an anterior portal established under direct visualization.  Large hemarthrosis was evacuated.  There was some areas of grade I chondromalacia on the humeral head which were debrided with a shaver the glenoid was in good condition.  Overall the articular surfaces were in good condition.  There was significant fraying and attenuation of the long head biceps tendon and we performed a tenotomy with a arthroscopic scissor.  There was anterior superior labral tearing and degenerative nature and we performed an anterior superior labral debridement.  There was an obvious large retracted tear of the rotator cuff involving the entirety of the supra and infraspinatus.  We completed evaluation debris within the glenoid joint.  Fluid was removed.  Arm was then dropped down to 30 degrees of abduction arthroscope introduced in the subacromial space of the posterior portal and a direct lateral portal substance department space.  Abundant dense bursal tissue multiple adhesions were encountered and these were all divided and excised, the Schaevitz Stryker wand.  One was then used to remove the periosteum from the undersurface of the anterior half of the acromion and subacromial decompression was performed with a bur creating a type I morphology.  Portals does have a strictly anterior to the distal clavicle and the distal clavicle resection was performed with a bur removing the proximal 8 mm of bone.  We  then returned our attention to the subacromial space and mobilized the rotator cuff tear which again involve the entirety supra and infraspinatus.  We prepared the greater tuberosity  removing soft tissue gently abrading the bone to bleeding bed.  With to the footprint was proxy for half centimeters.  After we prepared the tuberosity and confirmed that the rotator cuff had good elasticity continues to be brought back over the apex tuberosity went ahead and placed to Arthrex suture anchors loaded with fiber tape equidistant across the width of the tear such that all 8 suture limbs were then passed through the free margin of the rotator cuff tear using a scorpion suture passer.  We then placed 2 lateral anchors and these need to be upsized to a 5 5 for the anterior and a 65 for the posterior labral anchor is due to the moderate osteoporosis.  We ultimately were able to achieve good fixation and nice reapproximation of the rotator cuff tendon margin of the bony bed and tuberosity.  Suture limbs were all then clipped.  Final irrigation debridement was then completed.  Fluid and this was removed.  The portals were closed with Monocryl Steri-Strips.  Dry dressing taped about the right shoulder right and was placed in a sling immobilizer with abduction pillow the patient was awakened extubated and taken to recovery room in stable condition.  Jenetta Loges, PA-C was used as an Environmental consultant throughout this case essential for help with positioning the patient, position extremity, tissue manipulation, suture management, wound closure, and intraoperative decision-making.    Contact # [phone]

## 2017-12-23 NOTE — Anesthesia Postprocedure Evaluation (Signed)
Anesthesia Post Note  Patient: [name] [name]  Procedure(s) Performed: SHOULDER ARTHROSCOPY WITH ROTATOR CUFF REPAIR AND SUBACROMIAL DECOMPRESSION, distal clavicle resection (Right Shoulder) SHOULDER ARTHROSCOPY WITH DISTAL CLAVICLE RESECTION (Right Shoulder)     Anesthesia Type: General    Last Vitals:  Vitals:   12/23/17 1319 12/23/17 1335  BP:  125/70  Pulse: 64 72  Resp: (!) 22 (!) 22  Temp:    SpO2: 97% 97%    Last Pain:  Vitals:   12/23/17 0751  TempSrc: Oral                 [name] [name] DAVID

## 2017-12-23 NOTE — H&P (Signed)
[name] [name]    Chief Complaint: Right rotator cuff tear HPI: The patient is a 84 y.o. female s/p fall with massive rotator cuff tear and inability to elevate right arm. MRI suggests overall good quality musculature of rotator cuff consistent with acute injury  Past Medical History:  Diagnosis Date  . Adenomatous colon polyp     4 2008  . Breast cancer ([place]) 1978   left breast  . Complication of anesthesia   . Cystitis, interstitial   . Fracture    left ankle and rt wrist  . FRACTURE, ANKLE, LEFT 12/14/2006   Qualifier: Diagnosis of  By: Hulan Saas, CMA (AAMA), [place]   . FRACTURE, WRIST 12/14/2006   Qualifier: Diagnosis of  By: Hulan Saas, CMA (AAMA), [place]   . Hyperlipidemia   . Hypertension   . Osteoarthritis   . PONV (postoperative nausea and vomiting)    Episode after Ether  . Rotator cuff tear, right   . Shingles   . Urinary incontinence     Past Surgical History:  Procedure Laterality Date  . ABDOMINAL HYSTERECTOMY  1988   fibroids  . DILATION AND CURETTAGE OF UTERUS     x 2  . MASTECTOMY, PARTIAL  1978   left  . TONSILLECTOMY    . TUBAL LIGATION  77    Family History  Problem Relation Age of Onset  . Suicidality Mother   . Heart attack Father   . Cancer - Other Daughter        throat cancer     Social History:  reports that she has quit smoking. She has never used smokeless tobacco. She reports that she drinks alcohol. She reports that she does not use drugs.   Medications Prior to Admission  Medication Sig Dispense Refill  . amLODipine (NORVASC) 5 MG tablet TAKE 1 TABLET BY MOUTH ONCE DAILY 90 tablet 0  . Ascorbic Acid (VITAMIN C) 500 MG tablet Take 500 mg by mouth 2 (two) times daily.     . Calcium Carb-Cholecalciferol (CALCIUM 600+D3) 600-800 MG-UNIT TABS Take 1 tablet by mouth daily.    Marland Kitchen CINNAMON PO Take 1,000 mg by mouth 2 (two) times daily.     Marland Kitchen doxylamine, Sleep, (UNISOM) 25 MG tablet Take 25 mg by mouth at bedtime as needed for sleep.     . fish oil-omega-3 fatty acids 1000 MG capsule Take 1 g by mouth 2 (two) times daily.     Marland Kitchen loratadine (CLARITIN) 10 MG tablet Take 10 mg by mouth daily as needed for allergies.    . Magnesium 400 MG TABS Take 400 mg by mouth daily.     . Multiple Vitamins-Minerals (CENTRUM SILVER PO) Take 1 tablet by mouth daily.     . Vitamin D, Cholecalciferol, 1000 UNITS TABS Take 1,000 Units by mouth daily.     . traMADol (ULTRAM) 50 MG tablet Take 1 tablet (50 mg total) by mouth every 8 (eight) hours as needed. For shingles pain (Patient not taking: Reported on 12/15/2017) 30 tablet 0  . valACYclovir (VALTREX) 1000 MG tablet Take 1 tablet (1,000 mg total) by mouth 3 (three) times daily. (Patient not taking: Reported on 12/15/2017) 21 tablet 0     Physical Exam: right shoulder with profound loss of elevation strength and mobility. Exam as noted at recent office visit  Vitals  Temp:  [97.7 F (36.5 C)] 97.7 F (36.5 C) (05/23 0751) Pulse Rate:  [62] 62 (05/23 0751) Resp:  [17] 17 (05/23 0751)  BP: (147)/(46) 147/46 (05/23 0751) SpO2:  [95 %] 95 % (05/23 0751) Weight:  [54.7 kg (120 lb 8 oz)] 54.7 kg (120 lb 8 oz) (05/23 0825)  Assessment/Plan  Impression: Right rotator cuff tear  Plan of Action: Procedure(s): SHOULDER ARTHROSCOPY WITH ROTATOR CUFF REPAIR AND SUBACROMIAL DECOMPRESSION, distal clavicle resection SHOULDER ARTHROSCOPY WITH DISTAL CLAVICLE RESECTION  [name] [name] [name] [name] 12/23/2017, 9:43 AM Contact # [phone]

## 2017-12-23 NOTE — Discharge Instructions (Signed)
Metta Clines. Supple, M.D., F.A.A.O.S. Orthopaedic Surgery Specializing in Arthroscopic and Reconstructive Surgery of the Shoulder and Knee [phone] [address] [place], [place] [address] - Fax [phone]  POST-OP SHOULDER ARTHROSCOPIC ROTATOR CUFF AND/OR LABRAL REPAIR INSTRUCTIONS  1. Call the office at [phone] to schedule your first post-op appointment 7-10 days from the date of your surgery.  2. Leave the steri-strips in place over your incisions when performing dressing changes and showering. You may remove your dressings and begin showering 72 hours from surgery. You can expect drainage that is clear to bloody in nature that occasionally will soak through your dressings. If this occurs go ahead and perform a dressing change. The drainage should lessen daily and when there is no drainage from your incisions feel free to go without a dressing.  3. Wear your sling/immobilizer at all times except to perform the exercises below or to occasionally let your arm dangle by your side to stretch your elbow. You also need to sleep in your sling immobilizer until instructed otherwise.  4. Range of motion to your elbow, wrist, and hand are encouraged 3-5 times daily. Exercise to your hand and fingers helps to reduce swelling you may experience.  5. Utilize ice to the shoulder 3-4 times minimum a day and additionally if you are experiencing pain.  6. You may one-armed drive when safely off of narcotics and muscle relaxants. You may use your hand that is in the sling to support the steering wheel only. However, should it be your right arm that is in the sling it is not to be used for gear shifting in a manual transmission.  7. If you had a block pre-operatively to provide post-op pain relief you may want to go ahead and begin utilizing your pain meds as your arm begins to wake up. Blocks can sometimes last up to 16-18 hours. If you are still pain-free prior to going to bed you may  want to strongly consider taking a pain medication to avoid being awakened in the night with the onset of pain. A muscle relaxant is also provided for you should you experience muscle spasms. It is recommended that if you are experiencing pain that your pain medication alone is not controlling, add the muscle relaxant along with the pain medication which can give additional pain relief. The first one to two days is generally the most severe of your pain and then should gradually decrease. As your pain lessens it is recommended that you decrease your use of the pain medications to an "as needed basis" only and to always comply with the recommended dosages of the pain medications.  8. Pain medications can produce constipation along with their use. If you experience this, the use of an over the counter stool softener or laxative daily is recommended.   9. For additional questions or concerns, please do not hesitate to call the office. If after hours there is an answering service to forward your concerns to the physician on call.  9.Pain control following an exparel block  To help control your post-operative pain you received a nerve block  performed with Exparel which is a long acting anesthetic (numbing agent) which can provide pain relief and sensations of numbness (and relief of pain) in the operative shoulder and arm for up to 3 days. Sometimes it provides mixed relief, meaning you may still have numbness in certain areas of the arm but can still be able to move  parts of that arm,  hand, and fingers. We recommend that your prescribed pain medications  be used as needed. We do not feel it is necessary to "pre medicate" and "stay ahead" of pain.  Taking narcotic pain medications when you are not having any pain can lead to unnecessary and potentially dangerous side effects.   POST-OP EXERCISES  OK to come out of sling to allow arm to dangle by side for hygiene and showering and to gently move elbow wrist  and hand.  No shoulder exercises yet!!!

## 2017-12-23 NOTE — Transfer of Care (Signed)
Immediate Anesthesia Transfer of Care Note  Patient: [name] [name]  Procedure(s) Performed: SHOULDER ARTHROSCOPY WITH ROTATOR CUFF REPAIR AND SUBACROMIAL DECOMPRESSION, distal clavicle resection (Right Shoulder) SHOULDER ARTHROSCOPY WITH DISTAL CLAVICLE RESECTION (Right Shoulder)  Patient Location: PACU  Anesthesia Type:General and GA combined with regional for post-op pain  Level of Consciousness: drowsy and patient cooperative  Airway & Oxygen Therapy: Patient Spontanous Breathing and Patient connected to nasal cannula oxygen  Post-op Assessment: Report given to RN, Post -op Vital signs reviewed and stable and Patient moving all extremities X 4  Post vital signs: Reviewed and stable  Last Vitals:  Vitals Value Taken Time  BP 151/75 12/23/2017 12:46 PM  Temp    Pulse 67 12/23/2017 12:46 PM  Resp 10 12/23/2017 12:46 PM  SpO2 100 % 12/23/2017 12:46 PM  Vitals shown include unvalidated device data.  Last Pain:  Vitals:   12/23/17 0751  TempSrc: Oral         Complications: No apparent anesthesia complications

## 2017-12-23 NOTE — Anesthesia Procedure Notes (Signed)
Procedure Name: Intubation Date/Time: 12/23/2017 10:38 AM Performed by: Julieta Bellini, CRNA Pre-anesthesia Checklist: Patient identified, Emergency Drugs available, Suction available and Patient being monitored Patient Re-evaluated:Patient Re-evaluated prior to induction Oxygen Delivery Method: Circle system utilized Preoxygenation: Pre-oxygenation with 100% oxygen Induction Type: IV induction Ventilation: Mask ventilation without difficulty Laryngoscope Size: Mac and 3 Grade View: Grade I Tube type: Oral Tube size: 7.0 mm Number of attempts: 1 Airway Equipment and Method: Stylet Placement Confirmation: ETT inserted through vocal cords under direct vision,  positive ETCO2 and breath sounds checked- equal and bilateral Secured at: 21 cm Tube secured with: Tape Dental Injury: Teeth and Oropharynx as per pre-operative assessment

## 2017-12-23 NOTE — Anesthesia Procedure Notes (Signed)
Anesthesia Regional Block: Interscalene brachial plexus block   Pre-Anesthetic Checklist: ,, timeout performed, Correct Patient, Correct Site, Correct Laterality, Correct Procedure, Correct Position, site marked, Risks and benefits discussed,  Surgical consent,  Pre-op evaluation,  At surgeon's request and post-op pain management  Laterality: Right  Prep: chloraprep       Needles:  Injection technique: Single-shot  Needle Type: Echogenic Stimulator Needle     Needle Length: 5cm  Needle Gauge: 21     Additional Needles:   Procedures:, nerve stimulator,,,,,,,   Nerve Stimulator or Paresthesia:  Response: 0.4 mA,   Additional Responses:   Narrative:  Start time: 12/23/2017 9:30 AM End time: 12/23/2017 9:40 AM Injection made incrementally with aspirations every 5 mL.  Performed by: Personally  Anesthesiologist: Lillia Abed, MD  Additional Notes: Monitors applied. Patient sedated. Sterile prep and drape,hand hygiene and sterile gloves were used. Relevant anatomy identified.Needle position confirmed.Local anesthetic injected incrementally after negative aspiration. Local anesthetic spread visualized around nerve(s). Vascular puncture avoided. No complications. Image printed for medical record.The patient tolerated the procedure well.

## 2017-12-27 ENCOUNTER — Encounter (HOSPITAL_COMMUNITY): Payer: Self-pay | Admitting: Orthopedic Surgery

## 2017-12-31 DIAGNOSIS — M25511 Pain in right shoulder: Secondary | ICD-10-CM | POA: Diagnosis not present

## 2018-01-04 DIAGNOSIS — R351 Nocturia: Secondary | ICD-10-CM | POA: Diagnosis not present

## 2018-01-07 DIAGNOSIS — M25511 Pain in right shoulder: Secondary | ICD-10-CM | POA: Diagnosis not present

## 2018-01-14 DIAGNOSIS — M25511 Pain in right shoulder: Secondary | ICD-10-CM | POA: Diagnosis not present

## 2018-01-17 ENCOUNTER — Encounter (HOSPITAL_COMMUNITY): Payer: Self-pay | Admitting: Orthopedic Surgery

## 2018-01-18 ENCOUNTER — Ambulatory Visit: Payer: Medicare Other | Admitting: Internal Medicine

## 2018-01-21 DIAGNOSIS — M25511 Pain in right shoulder: Secondary | ICD-10-CM | POA: Diagnosis not present

## 2018-01-21 DIAGNOSIS — R351 Nocturia: Secondary | ICD-10-CM | POA: Diagnosis not present

## 2018-01-24 ENCOUNTER — Other Ambulatory Visit: Payer: Self-pay | Admitting: Internal Medicine

## 2018-01-26 NOTE — Telephone Encounter (Signed)
Sent to the pharmacy by e-scribe for 90 days.  Pt is on the schedule for 02/23/18.

## 2018-01-27 DIAGNOSIS — M25511 Pain in right shoulder: Secondary | ICD-10-CM | POA: Diagnosis not present

## 2018-02-01 DIAGNOSIS — M25511 Pain in right shoulder: Secondary | ICD-10-CM | POA: Diagnosis not present

## 2018-02-11 DIAGNOSIS — M25511 Pain in right shoulder: Secondary | ICD-10-CM | POA: Diagnosis not present

## 2018-02-16 DIAGNOSIS — H2513 Age-related nuclear cataract, bilateral: Secondary | ICD-10-CM | POA: Diagnosis not present

## 2018-02-16 DIAGNOSIS — M25511 Pain in right shoulder: Secondary | ICD-10-CM | POA: Diagnosis not present

## 2018-02-22 NOTE — Progress Notes (Signed)
Chief Complaint  Patient presents with  . Annual Exam    Discuss shingles vaccine    HPI: [name] [name] 84 y.o. comes in today for yearly visit   Chronic disease management .Since last visit.  Had had RC tear  w supsuquent surgery dr Onnie Graham   May 19 BP:      Doing ok   On meds   No falls  Not eating as well cause husband couldn't cook when she was rehabing  Cant afford   Hearing aids but hearing about the same  No current urinary sx hx of uti and  Issues in past   Health Maintenance  Topic Date Due  . DEXA SCAN  08/02/2018 (Originally 09/27/1998)  . INFLUENZA VACCINE  03/03/2018  . TETANUS/TDAP  08/03/2027  . PNA vac Low Risk Adult  Completed   Health Maintenance Review LIFESTYLE:  Exercise:    Rehab for shoulder  Tobacco/ETS: no Alcohol:  no Sugar beverages: no Sleep: take sometimes  Otc.     Drug use: no HH:2 Husband losing eye sight  Says  No chemo if ever developed cancer No dexa wont take  Bone building therapy   frinds got fractures   Hearing: dec but stable   Vision: galsses   Safety:  Has smoke detector and wears seat belts.  No firearms. No excess sun exposure. Sees dentist regularly. .  Memory: Felt to be good  , no concern from her or her family.  X word finding   Depression: No anhedonia unusual crying or depressive symptoms  Nutrition: Eats well balanced diet; adequate calcium and vitamin D. No swallowing chewing problems..  Other healthcare providers:  Reviewed today .  Social:  Lives with spouse married.  She is caretaker   Preventive parameters: up-to-date  Reviewed   ADLS:   There are no problems or need for assistance  driving, feeding, obtaining food, dressing, toileting and bathing, managing money using phone. She is independent. Takes care of  Household chores and finances cooking etc    ROS:  GEN/ HEENT: No fever, significant weight changes sweats headaches vision problems hearing changes, CV/ PULM; No chest pain shortness of  breath cough, syncope,edema  change in exercise tolerance. GI /GU: No adominal pain, vomiting, change in bowel habits. No blood in the stool. No significant GU symptoms. SKIN/HEME: ,no acute skin rashes suspicious lesions or bleeding. No lymphadenopathy, nodules, masses.  NEURO/ PSYCH:  No neurologic signs such as weakness numbness. No depression anxiety. IMM/ Allergy: No unusual infections.  Allergy .   REST of 12 system review negative except as per HPI   Past Medical History:  Diagnosis Date  . Adenomatous colon polyp     4 2008  . Breast cancer ([place]) 1978   left breast  . Complication of anesthesia   . Cystitis, interstitial   . Fracture    left ankle and rt wrist  . FRACTURE, ANKLE, LEFT 12/14/2006   Qualifier: Diagnosis of  By: Hulan Saas, CMA (AAMA), [place]   . FRACTURE, WRIST 12/14/2006   Qualifier: Diagnosis of  By: Hulan Saas, CMA (AAMA), [place]   . Hyperlipidemia   . Hypertension   . Osteoarthritis   . PONV (postoperative nausea and vomiting)    Episode after Ether  . Rotator cuff tear, right   . Shingles   . Urinary incontinence     Family History  Problem Relation Age of Onset  . Suicidality Mother   . Heart attack Father   .  Cancer - Other Daughter        throat cancer     Social History   Socioeconomic History  . Marital status: Married    Spouse name: Not on file  . Number of children: Not on file  . Years of education: Not on file  . Highest education level: Not on file  Occupational History  . Not on file  Social Needs  . Financial resource strain: Not on file  . Food insecurity:    Worry: Not on file    Inability: Not on file  . Transportation needs:    Medical: Not on file    Non-medical: Not on file  Tobacco Use  . Smoking status: Former Research scientist (life sciences)  . Smokeless tobacco: Never Used  . 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 Alcohol use: Yes    Comment: none at this time    . Drug use: No  . Sexual activity: Not on file  Lifestyle  . Physical activity:    Days per week: Not on file    Minutes per session: Not on file  . Stress: Not on file  Relationships  . Social connections:    Talks on phone: Not on file    Gets together: Not on file    Attends religious service: Not on file    Active member of club or organization: Not on file    Attends meetings of clubs or organizations: Not on file    Relationship status: Not on file  Other Topics Concern  . Not on file  Social History Narrative   Retired   Married   [place] of 2   Husband with medical problems   g5 p3   Ex smoker  Quit 33 years ago  [place]    Outpatient Encounter Medications as of 02/23/2018  Medication Sig  . amLODipine (NORVASC) 5 MG tablet TAKE 1 TABLET BY MOUTH ONCE DAILY  . Ascorbic Acid (VITAMIN C) 500 MG tablet Take 500 mg by mouth 2 (two) times daily.   . Calcium Carb-Cholecalciferol (CALCIUM 600+D3) 600-800 MG-UNIT TABS Take 1 tablet by mouth daily.  Marland Kitchen CINNAMON PO Take 1,000 mg by mouth 2 (two) times daily.   Marland Kitchen doxylamine, Sleep, (UNISOM) 25 MG tablet Take 25 mg by mouth at bedtime as needed for sleep.  . fish oil-omega-3 fatty acids 1000 MG capsule Take 1 g by mouth 2 (two) times daily.   Marland Kitchen loratadine (CLARITIN) 10 MG tablet Take 10 mg by mouth daily as needed for allergies.  . Magnesium 400 MG TABS Take 400 mg by mouth daily.   . Multiple Vitamins-Minerals (CENTRUM SILVER PO) Take 1 tablet by mouth daily.   . Vitamin D, Cholecalciferol, 1000 UNITS TABS Take 1,000 Units by mouth daily.   . [DISCONTINUED] naproxen (NAPROSYN) 500 MG tablet Take 1 tablet (500 mg total) by mouth 2 (two) times daily with a meal. (Patient not taking: Reported on 02/23/2018)  . [DISCONTINUED] ondansetron (ZOFRAN) 4 MG tablet Take 1 tablet (4 mg total) by mouth every 8 (eight) hours as needed for nausea or vomiting. (Patient not taking: Reported on 02/23/2018)  . [DISCONTINUED] oxyCODONE-acetaminophen  (PERCOCET) 5-325 MG tablet Take 1 tablet by mouth every 4 (four) hours as needed for severe pain. (Patient not taking: Reported on 02/23/2018)  . [DISCONTINUED] tiZANidine (ZANAFLEX) 4 MG capsule Take 1 capsule (4 mg total) by mouth 3 (three) times daily  as needed for muscle spasms. (Patient not taking: Reported on 02/23/2018)  . [DISCONTINUED] traMADol (ULTRAM) 50 MG tablet Take 1 tablet (50 mg total) by mouth every 6 (six) hours as needed for moderate pain. (Patient not taking: Reported on 02/23/2018)   No facility-administered encounter medications on file as of 02/23/2018.     EXAM:  BP 128/80 (BP Location: Right Arm, Patient Position: Sitting, Cuff Size: Normal)   Pulse (!) 42   Temp 97.8 F (36.6 C) (Oral)   Ht 5' 2.5" (1.588 m)   Wt 121 lb 12.8 oz (55.2 kg)   BMI 21.92 kg/m   Body mass index is 21.92 kg/m.  Physical Exam: Vital signs reviewed ZOX:WRUE is a well-developed well-nourished alert cooperative   who appears stated age in no acute distress.  HEENT: normocephalic atraumatic , Eyes: PERRL EOM's full, conjunctiva clear, Nares: paten,t no deformity discharge or tenderness., Ears: no deformity EAC's clear TMs with normal landmarks. Mouth: clear OP, no lesions, edema.  Moist mucous membranes. Dentition in adequate repair. NECK: supple without masses, thyromegaly or bruits. CHEST/PULM:  Clear to auscultation and percussion breath sounds equal no wheeze , rales or rhonchi. No chest wall deformities or tenderness. Left breast absent  Right no masses or adenopathy  CV: PMI is nondisplaced, S1 S2 no gallops, murmurs, rubs  Premature beats  . Marland Kitchen Peripheral pulses are full without delay.No JVD .  ABDOMEN: Bowel sounds normal nontender  No guard or rebound, no hepato splenomegal no CVA tenderness.    umbi out  But no tenderness or mass Extremtities:  No clubbing cyanosis or edema, no acute joint swelling or redness no focal atrophy deec rom right shoulder but  Elevation 80 degrees NEURO:   Oriented x3, cranial nerves 3-12 appear to be intact, no obvious focal weakness,gait within normal limits no abnormal reflexes or asymmetrical SKIN: No acute rashes normal turgor, color, no bruising or petechiae.  Left arm  Pressure ecchymosis  From ( gocery bag)  PSYCH: Oriented, good eye contact, no obvious depression anxiety, cognition and judgment appear normal. LN: no cervical axillary inguinal adenopathy No noted deficits in memory, attention, and speech.   Lab Results  Component Value Date   WBC 7.9 12/17/2017   HGB 14.8 12/17/2017   HCT 45.1 12/17/2017   PLT 171 12/17/2017   GLUCOSE 124 (H) 12/17/2017   CHOL 209 (H) 01/13/2017   TRIG 83.0 01/13/2017   HDL 59.90 01/13/2017   LDLDIRECT 165.0 12/07/2006   LDLCALC 133 (H) 01/13/2017   ALT 22 01/13/2017   AST 19 01/13/2017   NA 141 12/17/2017   K 4.6 12/17/2017   CL 110 12/17/2017   CREATININE 0.98 12/17/2017   BUN 29 (H) 12/17/2017   CO2 23 12/17/2017   TSH 1.29 11/19/2015   HGBA1C 6.1 01/13/2017    ASSESSMENT AND PLAN:  Discussed the following assessment and plan:  Essential hypertension - Plan: Lipid panel, Hemoglobin A5W, Basic metabolic panel, Hepatic function panel  Medication management - Plan: Lipid panel, Hemoglobin U9W, Basic metabolic panel, Hepatic function panel  Hyperlipidemia, unspecified hyperlipidemia type - Plan: Lipid panel, Hemoglobin J1B, Basic metabolic panel, Hepatic function panel  Fasting hyperglycemia - Plan: Lipid panel, Hemoglobin J4N, Basic metabolic panel, Hepatic function panel  Osteoporosis without current pathological fracture, unspecified osteoporosis type - declines dexa and meds will check vit d  - Plan: VITAMIN D 25 Hydroxy (Vit-D Deficiency, Fractures)  BREAST CANCER, HX OF Can get shingles vaccine  Next year   Or when convnient  Just  had shingles disease so immunity prob   Ok currently  Patient Care Team: Saahil Herbster, Standley Brooking, MD as PCP - General Franchot Gallo, MD  (Urology)  Patient Instructions  Consider  dexa scan .   Even if not taking meds for osteoporosis .  Will notify you  of labs when available.  Blood pressure is good today .    Health Maintenance, Female Adopting a healthy lifestyle and getting preventive care can go a long way to promote health and wellness. Talk with your health care provider about what schedule of regular examinations is right for you. This is a good chance for you to check in with your provider about disease prevention and staying healthy. In between checkups, there are plenty of things you can do on your own. Experts have done a lot of research about which lifestyle changes and preventive measures are most likely to keep you healthy. Ask your health care provider for more information. Weight and diet Eat a healthy diet  Be sure to include plenty of vegetables, fruits, low-fat dairy products, and lean protein.  Do not eat a lot of foods high in solid fats, added sugars, or salt.  Get regular exercise. This is one of the most important things you can do for your health. ? Most adults should exercise for at least 150 minutes each week. The exercise should increase your heart rate and make you sweat (moderate-intensity exercise). ? Most adults should also do strengthening exercises at least twice a week. This is in addition to the moderate-intensity exercise.  Maintain a healthy weight  Body mass index (BMI) is a measurement that can be used to identify possible weight problems. It estimates body fat based on height and weight. Your health care provider can help determine your BMI and help you achieve or maintain a healthy weight.  For females 36 years of age and older: ? A BMI below 18.5 is considered underweight. ? A BMI of 18.5 to 24.9 is normal. ? A BMI of 25 to 29.9 is considered overweight. ? A BMI of 30 and above is considered obese.  Watch levels of cholesterol and blood lipids  You should start having your  blood tested for lipids and cholesterol at 82 years of age, then have this test every 5 years.  You may need to have your cholesterol levels checked more often if: ? Your lipid or cholesterol levels are high. ? You are older than 82 years of age. ? You are at high risk for heart disease.  Cancer screening Lung Cancer  Lung cancer screening is recommended for adults 48-86 years old who are at high risk for lung cancer because of a history of smoking.  A yearly low-dose CT scan of the lungs is recommended for people who: ? Currently smoke. ? Have quit within the past 15 years. ? Have at least a 30-pack-year history of smoking. A pack year is smoking an average of one pack of cigarettes a day for 1 year.  Yearly screening should continue until it has been 15 years since you quit.  Yearly screening should stop if you develop a health problem that would prevent you from having lung cancer treatment.  Breast Cancer  Practice breast self-awareness. This means understanding how your breasts normally appear and feel.  It also means doing regular breast self-exams. Let your health care provider know about any changes, no matter how small.  If you are in your 20s or 30s, you should have  a clinical breast exam (CBE) by a health care provider every 1-3 years as part of a regular health exam.  If you are 76 or older, have a CBE every year. Also consider having a breast X-ray (mammogram) every year.  If you have a family history of breast cancer, talk to your health care provider about genetic screening.  If you are at high risk for breast cancer, talk to your health care provider about having an MRI and a mammogram every year.  Breast cancer gene (BRCA) assessment is recommended for women who have family members with BRCA-related cancers. BRCA-related cancers include: ? Breast. ? Ovarian. ? Tubal. ? Peritoneal cancers.  Results of the assessment will determine the need for genetic  counseling and BRCA1 and BRCA2 testing.  Cervical Cancer Your health care provider may recommend that you be screened regularly for cancer of the pelvic organs (ovaries, uterus, and vagina). This screening involves a pelvic examination, including checking for microscopic changes to the surface of your cervix (Pap test). You may be encouraged to have this screening done every 3 years, beginning at age 28.  For women ages 99-65, health care providers may recommend pelvic exams and Pap testing every 3 years, or they may recommend the Pap and pelvic exam, combined with testing for human papilloma virus (HPV), every 5 years. Some types of HPV increase your risk of cervical cancer. Testing for HPV may also be done on women of any age with unclear Pap test results.  Other health care providers may not recommend any screening for nonpregnant women who are considered low risk for pelvic cancer and who do not have symptoms. Ask your health care provider if a screening pelvic exam is right for you.  If you have had past treatment for cervical cancer or a condition that could lead to cancer, you need Pap tests and screening for cancer for at least 20 years after your treatment. If Pap tests have been discontinued, your risk factors (such as having a new sexual partner) need to be reassessed to determine if screening should resume. Some women have medical problems that increase the chance of getting cervical cancer. In these cases, your health care provider may recommend more frequent screening and Pap tests.  Colorectal Cancer  This type of cancer can be detected and often prevented.  Routine colorectal cancer screening usually begins at 82 years of age and continues through 82 years of age.  Your health care provider may recommend screening at an earlier age if you have risk factors for colon cancer.  Your health care provider may also recommend using home test kits to check for hidden blood in the  stool.  A small camera at the end of a tube can be used to examine your colon directly (sigmoidoscopy or colonoscopy). This is done to check for the earliest forms of colorectal cancer.  Routine screening usually begins at age 88.  Direct examination of the colon should be repeated every 5-10 years through 82 years of age. However, you may need to be screened more often if early forms of precancerous polyps or small growths are found.  Skin Cancer  Check your skin from head to toe regularly.  Tell your health care provider about any new moles or changes in moles, especially if there is a change in a mole's shape or color.  Also tell your health care provider if you have a mole that is larger than the size of a pencil eraser.  Always use  sunscreen. Apply sunscreen liberally and repeatedly throughout the day.  Protect yourself by wearing long sleeves, pants, a wide-brimmed hat, and sunglasses whenever you are outside.  Heart disease, diabetes, and high blood pressure  High blood pressure causes heart disease and increases the risk of stroke. High blood pressure is more likely to develop in: ? People who have blood pressure in the high end of the normal range (130-139/85-89 mm Hg). ? People who are overweight or obese. ? People who are African American.  If you are 61-32 years of age, have your blood pressure checked every 3-5 years. If you are 55 years of age or older, have your blood pressure checked every year. You should have your blood pressure measured twice-once when you are at a hospital or clinic, and once when you are not at a hospital or clinic. Record the average of the two measurements. To check your blood pressure when you are not at a hospital or clinic, you can use: ? An automated blood pressure machine at a pharmacy. ? A home blood pressure monitor.  If you are between 60 years and 1 years old, ask your health care provider if you should take aspirin to prevent  strokes.  Have regular diabetes screenings. This involves taking a blood sample to check your fasting blood sugar level. ? If you are at a normal weight and have a low risk for diabetes, have this test once every three years after 82 years of age. ? If you are overweight and have a high risk for diabetes, consider being tested at a younger age or more often. Preventing infection Hepatitis B  If you have a higher risk for hepatitis B, you should be screened for this virus. You are considered at high risk for hepatitis B if: ? You were born in a country where hepatitis B is common. Ask your health care provider which countries are considered high risk. ? Your parents were born in a high-risk country, and you have not been immunized against hepatitis B (hepatitis B vaccine). ? You have HIV or AIDS. ? You use needles to inject street drugs. ? You live with someone who has hepatitis B. ? You have had sex with someone who has hepatitis B. ? You get hemodialysis treatment. ? You take certain medicines for conditions, including cancer, organ transplantation, and autoimmune conditions.  Hepatitis C  Blood testing is recommended for: ? Everyone born from [birth year] through 1963. ? Anyone with known risk factors for hepatitis C.  Sexually transmitted infections (STIs)  You should be screened for sexually transmitted infections (STIs) including gonorrhea and chlamydia if: ? You are sexually active and are younger than 82 years of age. ? You are older than 82 years of age and your health care provider tells you that you are at risk for this type of infection. ? Your sexual activity has changed since you were last screened and you are at an increased risk for chlamydia or gonorrhea. Ask your health care provider if you are at risk.  If you do not have HIV, but are at risk, it may be recommended that you take a prescription medicine daily to prevent HIV infection. This is called pre-exposure prophylaxis  (PrEP). You are considered at risk if: ? You are sexually active and do not regularly use condoms or know the HIV status of your partner(s). ? You take drugs by injection. ? You are sexually active with a partner who has HIV.  Talk with your health  care provider about whether you are at high risk of being infected with HIV. If you choose to begin PrEP, you should first be tested for HIV. You should then be tested every 3 months for as long as you are taking PrEP. Pregnancy  If you are premenopausal and you may become pregnant, ask your health care provider about preconception counseling.  If you may become pregnant, take 400 to 800 micrograms (mcg) of folic acid every day.  If you want to prevent pregnancy, talk to your health care provider about birth control (contraception). Osteoporosis and menopause  Osteoporosis is a disease in which the bones lose minerals and strength with aging. This can result in serious bone fractures. Your risk for osteoporosis can be identified using a bone density scan.  If you are 51 years of age or older, or if you are at risk for osteoporosis and fractures, ask your health care provider if you should be screened.  Ask your health care provider whether you should take a calcium or vitamin D supplement to lower your risk for osteoporosis.  Menopause may have certain physical symptoms and risks.  Hormone replacement therapy may reduce some of these symptoms and risks. Talk to your health care provider about whether hormone replacement therapy is right for you. Follow these instructions at home:  Schedule regular health, dental, and eye exams.  Stay current with your immunizations.  Do not use any tobacco products including cigarettes, chewing tobacco, or electronic cigarettes.  If you are pregnant, do not drink alcohol.  If you are breastfeeding, limit how much and how often you drink alcohol.  Limit alcohol intake to no more than 1 drink per day for  nonpregnant women. One drink equals 12 ounces of beer, 5 ounces of wine, or 1 ounces of hard liquor.  Do not use street drugs.  Do not share needles.  Ask your health care provider for help if you need support or information about quitting drugs.  Tell your health care provider if you often feel depressed.  Tell your health care provider if you have ever been abused or do not feel safe at home. This information is not intended to replace advice given to you by your health care provider. Make sure you discuss any questions you have with your health care provider. Document Released: 02/02/2011 Document Revised: 12/26/2015 Document Reviewed: 04/23/2015 Elsevier Interactive Patient Education  2018 [place]. Aamya Orellana M.D.

## 2018-02-23 ENCOUNTER — Ambulatory Visit (INDEPENDENT_AMBULATORY_CARE_PROVIDER_SITE_OTHER): Payer: Medicare Other | Admitting: Internal Medicine

## 2018-02-23 ENCOUNTER — Encounter: Payer: Self-pay | Admitting: Internal Medicine

## 2018-02-23 VITALS — BP 128/80 | HR 42 | Temp 97.8°F | Ht 62.5 in | Wt 121.8 lb

## 2018-02-23 DIAGNOSIS — Z0000 Encounter for general adult medical examination without abnormal findings: Secondary | ICD-10-CM | POA: Diagnosis not present

## 2018-02-23 DIAGNOSIS — M810 Age-related osteoporosis without current pathological fracture: Secondary | ICD-10-CM

## 2018-02-23 DIAGNOSIS — Z853 Personal history of malignant neoplasm of breast: Secondary | ICD-10-CM

## 2018-02-23 DIAGNOSIS — I10 Essential (primary) hypertension: Secondary | ICD-10-CM | POA: Diagnosis not present

## 2018-02-23 DIAGNOSIS — Z79899 Other long term (current) drug therapy: Secondary | ICD-10-CM | POA: Diagnosis not present

## 2018-02-23 DIAGNOSIS — R7301 Impaired fasting glucose: Secondary | ICD-10-CM | POA: Diagnosis not present

## 2018-02-23 DIAGNOSIS — E785 Hyperlipidemia, unspecified: Secondary | ICD-10-CM

## 2018-02-23 LAB — HEPATIC FUNCTION PANEL
ALT: 24 U/L (ref 0–35)
AST: 21 U/L (ref 0–37)
Albumin: 4.3 g/dL (ref 3.5–5.2)
Alkaline Phosphatase: 76 U/L (ref 39–117)
BILIRUBIN DIRECT: 0.1 mg/dL (ref 0.0–0.3)
Total Bilirubin: 0.7 mg/dL (ref 0.2–1.2)
Total Protein: 7 g/dL (ref 6.0–8.3)

## 2018-02-23 LAB — BASIC METABOLIC PANEL
BUN: 27 mg/dL — ABNORMAL HIGH (ref 6–23)
CALCIUM: 9 mg/dL (ref 8.4–10.5)
CO2: 26 mEq/L (ref 19–32)
Chloride: 103 mEq/L (ref 96–112)
Creatinine, Ser: 0.75 mg/dL (ref 0.40–1.20)
GFR: 78.17 mL/min (ref >60.00)
Glucose, Bld: 113 mg/dL — ABNORMAL HIGH (ref 70–99)
Potassium: 4.5 mEq/L (ref 3.5–5.1)
SODIUM: 137 meq/L (ref 135–145)

## 2018-02-23 LAB — LIPID PANEL
Cholesterol: 211 mg/dL — ABNORMAL HIGH (ref 0–200)
HDL: 56.5 mg/dL (ref >39.00)
LDL CALC: 136 mg/dL — AB (ref 0–99)
NonHDL: 154.77
Total CHOL/HDL Ratio: 4
Triglycerides: 92 mg/dL (ref 0.0–149.0)
VLDL: 18.4 mg/dL (ref 0.0–40.0)

## 2018-02-23 LAB — VITAMIN D 25 HYDROXY (VIT D DEFICIENCY, FRACTURES): VITD: 70.74 ng/mL (ref 30.00–100.00)

## 2018-02-23 LAB — HEMOGLOBIN A1C: Hgb A1c MFr Bld: 5.9 % (ref 4.6–6.5)

## 2018-02-23 NOTE — Patient Instructions (Addendum)
Consider  dexa scan .   Even if not taking meds for osteoporosis .  Will notify you  of labs when available.  Blood pressure is good today .    Health Maintenance, Female Adopting a healthy lifestyle and getting preventive care can go a long way to promote health and wellness. Talk with your health care provider about what schedule of regular examinations is right for you. This is a good chance for you to check in with your provider about disease prevention and staying healthy. In between checkups, there are plenty of things you can do on your own. Experts have done a lot of research about which lifestyle changes and preventive measures are most likely to keep you healthy. Ask your health care provider for more information. Weight and diet Eat a healthy diet  Be sure to include plenty of vegetables, fruits, low-fat dairy products, and lean protein.  Do not eat a lot of foods high in solid fats, added sugars, or salt.  Get regular exercise. This is one of the most important things you can do for your health. ? Most adults should exercise for at least 150 minutes each week. The exercise should increase your heart rate and make you sweat (moderate-intensity exercise). ? Most adults should also do strengthening exercises at least twice a week. This is in addition to the moderate-intensity exercise.  Maintain a healthy weight  Body mass index (BMI) is a measurement that can be used to identify possible weight problems. It estimates body fat based on height and weight. Your health care provider can help determine your BMI and help you achieve or maintain a healthy weight.  For females 58 years of age and older: ? A BMI below 18.5 is considered underweight. ? A BMI of 18.5 to 24.9 is normal. ? A BMI of 25 to 29.9 is considered overweight. ? A BMI of 30 and above is considered obese.  Watch levels of cholesterol and blood lipids  You should start having your blood tested for lipids and  cholesterol at 82 years of age, then have this test every 5 years.  You may need to have your cholesterol levels checked more often if: ? Your lipid or cholesterol levels are high. ? You are older than 82 years of age. ? You are at high risk for heart disease.  Cancer screening Lung Cancer  Lung cancer screening is recommended for adults 37-74 years old who are at high risk for lung cancer because of a history of smoking.  A yearly low-dose CT scan of the lungs is recommended for people who: ? Currently smoke. ? Have quit within the past 15 years. ? Have at least a 30-pack-year history of smoking. A pack year is smoking an average of one pack of cigarettes a day for 1 year.  Yearly screening should continue until it has been 15 years since you quit.  Yearly screening should stop if you develop a health problem that would prevent you from having lung cancer treatment.  Breast Cancer  Practice breast self-awareness. This means understanding how your breasts normally appear and feel.  It also means doing regular breast self-exams. Let your health care provider know about any changes, no matter how small.  If you are in your 20s or 30s, you should have a clinical breast exam (CBE) by a health care provider every 1-3 years as part of a regular health exam.  If you are 56 or older, have a CBE every year. Also  consider having a breast X-ray (mammogram) every year.  If you have a family history of breast cancer, talk to your health care provider about genetic screening.  If you are at high risk for breast cancer, talk to your health care provider about having an MRI and a mammogram every year.  Breast cancer gene (BRCA) assessment is recommended for women who have family members with BRCA-related cancers. BRCA-related cancers include: ? Breast. ? Ovarian. ? Tubal. ? Peritoneal cancers.  Results of the assessment will determine the need for genetic counseling and BRCA1 and BRCA2  testing.  Cervical Cancer Your health care provider may recommend that you be screened regularly for cancer of the pelvic organs (ovaries, uterus, and vagina). This screening involves a pelvic examination, including checking for microscopic changes to the surface of your cervix (Pap test). You may be encouraged to have this screening done every 3 years, beginning at age 47.  For women ages 60-65, health care providers may recommend pelvic exams and Pap testing every 3 years, or they may recommend the Pap and pelvic exam, combined with testing for human papilloma virus (HPV), every 5 years. Some types of HPV increase your risk of cervical cancer. Testing for HPV may also be done on women of any age with unclear Pap test results.  Other health care providers may not recommend any screening for nonpregnant women who are considered low risk for pelvic cancer and who do not have symptoms. Ask your health care provider if a screening pelvic exam is right for you.  If you have had past treatment for cervical cancer or a condition that could lead to cancer, you need Pap tests and screening for cancer for at least 20 years after your treatment. If Pap tests have been discontinued, your risk factors (such as having a new sexual partner) need to be reassessed to determine if screening should resume. Some women have medical problems that increase the chance of getting cervical cancer. In these cases, your health care provider may recommend more frequent screening and Pap tests.  Colorectal Cancer  This type of cancer can be detected and often prevented.  Routine colorectal cancer screening usually begins at 82 years of age and continues through 82 years of age.  Your health care provider may recommend screening at an earlier age if you have risk factors for colon cancer.  Your health care provider may also recommend using home test kits to check for hidden blood in the stool.  A small camera at the end of a  tube can be used to examine your colon directly (sigmoidoscopy or colonoscopy). This is done to check for the earliest forms of colorectal cancer.  Routine screening usually begins at age 61.  Direct examination of the colon should be repeated every 5-10 years through 82 years of age. However, you may need to be screened more often if early forms of precancerous polyps or small growths are found.  Skin Cancer  Check your skin from head to toe regularly.  Tell your health care provider about any new moles or changes in moles, especially if there is a change in a mole's shape or color.  Also tell your health care provider if you have a mole that is larger than the size of a pencil eraser.  Always use sunscreen. Apply sunscreen liberally and repeatedly throughout the day.  Protect yourself by wearing long sleeves, pants, a wide-brimmed hat, and sunglasses whenever you are outside.  Heart disease, diabetes, and high blood  pressure  High blood pressure causes heart disease and increases the risk of stroke. High blood pressure is more likely to develop in: ? People who have blood pressure in the high end of the normal range (130-139/85-89 mm Hg). ? People who are overweight or obese. ? People who are African American.  If you are 34-17 years of age, have your blood pressure checked every 3-5 years. If you are 79 years of age or older, have your blood pressure checked every year. You should have your blood pressure measured twice-once when you are at a hospital or clinic, and once when you are not at a hospital or clinic. Record the average of the two measurements. To check your blood pressure when you are not at a hospital or clinic, you can use: ? An automated blood pressure machine at a pharmacy. ? A home blood pressure monitor.  If you are between 15 years and 57 years old, ask your health care provider if you should take aspirin to prevent strokes.  Have regular diabetes screenings. This  involves taking a blood sample to check your fasting blood sugar level. ? If you are at a normal weight and have a low risk for diabetes, have this test once every three years after 82 years of age. ? If you are overweight and have a high risk for diabetes, consider being tested at a younger age or more often. Preventing infection Hepatitis B  If you have a higher risk for hepatitis B, you should be screened for this virus. You are considered at high risk for hepatitis B if: ? You were born in a country where hepatitis B is common. Ask your health care provider which countries are considered high risk. ? Your parents were born in a high-risk country, and you have not been immunized against hepatitis B (hepatitis B vaccine). ? You have HIV or AIDS. ? You use needles to inject street drugs. ? You live with someone who has hepatitis B. ? You have had sex with someone who has hepatitis B. ? You get hemodialysis treatment. ? You take certain medicines for conditions, including cancer, organ transplantation, and autoimmune conditions.  Hepatitis C  Blood testing is recommended for: ? Everyone born from [birth year] through 1963. ? Anyone with known risk factors for hepatitis C.  Sexually transmitted infections (STIs)  You should be screened for sexually transmitted infections (STIs) including gonorrhea and chlamydia if: ? You are sexually active and are younger than 82 years of age. ? You are older than 82 years of age and your health care provider tells you that you are at risk for this type of infection. ? Your sexual activity has changed since you were last screened and you are at an increased risk for chlamydia or gonorrhea. Ask your health care provider if you are at risk.  If you do not have HIV, but are at risk, it may be recommended that you take a prescription medicine daily to prevent HIV infection. This is called pre-exposure prophylaxis (PrEP). You are considered at risk if: ? You are  sexually active and do not regularly use condoms or know the HIV status of your partner(s). ? You take drugs by injection. ? You are sexually active with a partner who has HIV.  Talk with your health care provider about whether you are at high risk of being infected with HIV. If you choose to begin PrEP, you should first be tested for HIV. You should then be tested every  3 months for as long as you are taking PrEP. Pregnancy  If you are premenopausal and you may become pregnant, ask your health care provider about preconception counseling.  If you may become pregnant, take 400 to 800 micrograms (mcg) of folic acid every day.  If you want to prevent pregnancy, talk to your health care provider about birth control (contraception). Osteoporosis and menopause  Osteoporosis is a disease in which the bones lose minerals and strength with aging. This can result in serious bone fractures. Your risk for osteoporosis can be identified using a bone density scan.  If you are 50 years of age or older, or if you are at risk for osteoporosis and fractures, ask your health care provider if you should be screened.  Ask your health care provider whether you should take a calcium or vitamin D supplement to lower your risk for osteoporosis.  Menopause may have certain physical symptoms and risks.  Hormone replacement therapy may reduce some of these symptoms and risks. Talk to your health care provider about whether hormone replacement therapy is right for you. Follow these instructions at home:  Schedule regular health, dental, and eye exams.  Stay current with your immunizations.  Do not use any tobacco products including cigarettes, chewing tobacco, or electronic cigarettes.  If you are pregnant, do not drink alcohol.  If you are breastfeeding, limit how much and how often you drink alcohol.  Limit alcohol intake to no more than 1 drink per day for nonpregnant women. One drink equals 12 ounces of  beer, 5 ounces of wine, or 1 ounces of hard liquor.  Do not use street drugs.  Do not share needles.  Ask your health care provider for help if you need support or information about quitting drugs.  Tell your health care provider if you often feel depressed.  Tell your health care provider if you have ever been abused or do not feel safe at home. This information is not intended to replace advice given to you by your health care provider. Make sure you discuss any questions you have with your health care provider. Document Released: 02/02/2011 Document Revised: 12/26/2015 Document Reviewed: 04/23/2015 Elsevier Interactive Patient Education  [employer].

## 2018-02-24 DIAGNOSIS — M25511 Pain in right shoulder: Secondary | ICD-10-CM | POA: Diagnosis not present

## 2018-03-02 DIAGNOSIS — M25511 Pain in right shoulder: Secondary | ICD-10-CM | POA: Diagnosis not present

## 2018-03-08 DIAGNOSIS — M25511 Pain in right shoulder: Secondary | ICD-10-CM | POA: Diagnosis not present

## 2018-03-16 DIAGNOSIS — M25511 Pain in right shoulder: Secondary | ICD-10-CM | POA: Diagnosis not present

## 2018-03-23 DIAGNOSIS — M25511 Pain in right shoulder: Secondary | ICD-10-CM | POA: Diagnosis not present

## 2018-04-06 DIAGNOSIS — M25511 Pain in right shoulder: Secondary | ICD-10-CM | POA: Diagnosis not present

## 2018-04-12 DIAGNOSIS — M25511 Pain in right shoulder: Secondary | ICD-10-CM | POA: Diagnosis not present

## 2018-04-19 DIAGNOSIS — M25511 Pain in right shoulder: Secondary | ICD-10-CM | POA: Diagnosis not present

## 2018-04-26 DIAGNOSIS — M25511 Pain in right shoulder: Secondary | ICD-10-CM | POA: Diagnosis not present

## 2018-04-27 ENCOUNTER — Other Ambulatory Visit: Payer: Self-pay | Admitting: Internal Medicine

## 2018-04-27 ENCOUNTER — Ambulatory Visit: Payer: Medicare Other | Admitting: Internal Medicine

## 2018-04-27 NOTE — Progress Notes (Signed)
Chief Complaint  Patient presents with  . Alopecia    Pt has been losing hair x 4-5 months. Has been losing hair since surgery in May 2019. Pt is depressed, husband is going blind and she is unable care for him and there are alot of housing repairs that need to be done and she does not have the means to take care of them.     HPI: [name] [name] 84 y.o. come in foracute visit   Concern  For hair thinning and shedding   Worried she is going bald  No scalp lesion  But some scale / hasnt washed hair in 5 days concern it will all gall out.  Had shoudler surgery end may 2019  Husband  caretaking stress no new meds illnesses noted  No new rashes     bp ok needs refill  ROS: See pertinent positives and negatives per HPI.  Past Medical History:  Diagnosis Date  . Adenomatous colon polyp     4 2008  . Breast cancer ([place]) 1978   left breast  . Complication of anesthesia   . Cystitis, interstitial   . Fracture    left ankle and rt wrist  . FRACTURE, ANKLE, LEFT 12/14/2006   Qualifier: Diagnosis of  By: Hulan Saas, CMA (AAMA), [place]   . FRACTURE, WRIST 12/14/2006   Qualifier: Diagnosis of  By: Hulan Saas, CMA (AAMA), [place]   . Hyperlipidemia   . Hypertension   . Osteoarthritis   . PONV (postoperative nausea and vomiting)    Episode after Ether  . Rotator cuff tear, right   . Shingles   . Urinary incontinence     Family History  Problem Relation Age of Onset  . Suicidality Mother   . Heart attack Father   . Cancer - Other Daughter        throat cancer     Social History   Socioeconomic History  . Marital status: Married    Spouse name: Not on file  . Number of children: Not on file  . Years of education: Not on file  . Highest education level: Not on file  Occupational History  . Not on file  Social Needs  . Financial resource strain: Not on file  . Food insecurity:    Worry: Not on file    Inability: Not on file  . Transportation needs:    Medical: Not on  file    Non-medical: Not on file  Tobacco Use  . Smoking status: Former Research scientist (life sciences)  . Smokeless tobacco: Never Used  . 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 Alcohol use: Yes    Comment: none at this time   . Drug use: No  . Sexual activity: Not on file  Lifestyle  . Physical activity:    Days per week: Not on file    Minutes per session: Not on file  . Stress: Not on file  Relationships  . Social connections:    Talks on phone: Not on file    Gets together: Not on file    Attends religious service: Not on file    Active member of club or organization: Not on file    Attends meetings of clubs or organizations: Not on file    Relationship status: Not on file  Other Topics Concern  . Not on file  Social History Narrative   Retired  Married   [place] 2   Husband with medical problems   g5 p3   Ex smoker  Quit 33 years ago  [place]    Outpatient Medications Prior to Visit  Medication Sig Dispense Refill  . Ascorbic Acid (VITAMIN C) 500 MG tablet Take 500 mg by mouth 2 (two) times daily.     . Calcium Carb-Cholecalciferol (CALCIUM 600+D3) 600-800 MG-UNIT TABS Take 1 tablet by mouth daily.    Marland Kitchen CINNAMON PO Take 1,000 mg by mouth 2 (two) times daily.     Marland Kitchen doxylamine, Sleep, (UNISOM) 25 MG tablet Take 25 mg by mouth at bedtime as needed for sleep.    . fish oil-omega-3 fatty acids 1000 MG capsule Take 1 g by mouth 2 (two) times daily.     Marland Kitchen loratadine (CLARITIN) 10 MG tablet Take 10 mg by mouth daily as needed for allergies.    . Magnesium 400 MG TABS Take 400 mg by mouth daily.     . Multiple Vitamins-Minerals (CENTRUM SILVER PO) Take 1 tablet by mouth daily.     . Vitamin D, Cholecalciferol, 1000 UNITS TABS Take 1,000 Units by mouth daily.     Marland Kitchen amLODipine (NORVASC) 5 MG tablet TAKE 1 TABLET BY MOUTH ONCE DAILY 90 tablet 0   No facility-administered medications prior to visit.      EXAM:  BP 132/82  (BP Location: Right Arm, Patient Position: Sitting, Cuff Size: Normal)   Pulse (!) 42   Temp 97.9 F (36.6 C) (Oral)   Wt 121 lb 3.2 oz (55 kg)   BMI 21.81 kg/m   Body mass index is 21.81 kg/m.  GENERAL: vitals reviewed and listed above, alert, oriented, appears well hydrated and in no acute distress HEENT: atraumatic, conjunctiva  clear, no obvious abnormalities on inspection of external nose and ears NECK: no obvious masses on inspection palpation  LUNGS: clear to auscultation bilaterally, no wheezes, rales or rhonchi, good air movement CV: HRRR, no clubbing cyanosis or  peripheral edema nl cap refill  MS: moves all extremities without noticeable focal  Abnormality dec rom right shoulder  Skin no acute findings  Hair thinning but no areata areas  Some scalyiness   Irritation but no plaque   distribution seems nl for age  Eye brows ? Ok  PSYCH: pleasant and cooperative,stressed about the hair and situation cognition and attention is normal Lab Results  Component Value Date   WBC 7.9 12/17/2017   HGB 14.8 12/17/2017   HCT 45.1 12/17/2017   PLT 171 12/17/2017   GLUCOSE 113 (H) 02/23/2018   CHOL 211 (H) 02/23/2018   TRIG 92.0 02/23/2018   HDL 56.50 02/23/2018   LDLDIRECT 165.0 12/07/2006   LDLCALC 136 (H) 02/23/2018   ALT 24 02/23/2018   AST 21 02/23/2018   NA 137 02/23/2018   K 4.5 02/23/2018   CL 103 02/23/2018   CREATININE 0.75 02/23/2018   BUN 27 (H) 02/23/2018   CO2 26 02/23/2018   TSH 1.29 11/19/2015   HGBA1C 5.9 02/23/2018   BP Readings from Last 3 Encounters:  04/28/18 132/82  02/23/18 128/80  12/23/17 129/79    ASSESSMENT AND PLAN:  Discussed the following assessment and plan:  Hair loss - new consider telogen effluvian  more eval  som scal to scalp but no lesion - Plan: TSH, T4, free, CBC with Differential/Platelet, Sedimentation rate, ANA, Ambulatory referral to Dermatology  Adjustment disorder with depressed mood - Plan: TSH, T4, free, CBC with  Differential/Platelet,  Sedimentation rate, ANA  Need for influenza vaccination - Plan: Flu vaccine HIGH DOSE PF (Fluzone High dose)  Medication management  Essential hypertension Uncertain cause  recent  And very distressing to patient at this time   R/o metabolic;ic  ? If  Telogen effluvium vs other  Labs today and derm referral    Disc  Social stresses and she decline counseling  Has supports   -Patient advised to return or notify health care team  if  new concerns arise. Total visit 54mins > 50% spent counseling and coordinating care as indicated in above note and in instructions to patient .   Patient Instructions  This could be from   reacting to the surgery you had  should get better soon     temporary shedding . But  Need thyroid testing  And  Dermatology follow up to get more information.  ? Telogen effluvian.   consider counseling about the home situation and stress of care taking.   You will be contacted about  Dermatology appts       Standley Brooking. Agapita Savarino M.D.

## 2018-04-28 ENCOUNTER — Encounter: Payer: Self-pay | Admitting: Internal Medicine

## 2018-04-28 ENCOUNTER — Ambulatory Visit (INDEPENDENT_AMBULATORY_CARE_PROVIDER_SITE_OTHER): Payer: Medicare Other | Admitting: Internal Medicine

## 2018-04-28 VITALS — BP 132/82 | HR 42 | Temp 97.9°F | Wt 121.2 lb

## 2018-04-28 DIAGNOSIS — F4321 Adjustment disorder with depressed mood: Secondary | ICD-10-CM

## 2018-04-28 DIAGNOSIS — Z23 Encounter for immunization: Secondary | ICD-10-CM | POA: Diagnosis not present

## 2018-04-28 DIAGNOSIS — Z79899 Other long term (current) drug therapy: Secondary | ICD-10-CM | POA: Diagnosis not present

## 2018-04-28 DIAGNOSIS — L659 Nonscarring hair loss, unspecified: Secondary | ICD-10-CM | POA: Diagnosis not present

## 2018-04-28 DIAGNOSIS — I10 Essential (primary) hypertension: Secondary | ICD-10-CM

## 2018-04-28 LAB — T4, FREE: FREE T4: 1.91 ng/dL — AB (ref 0.60–1.60)

## 2018-04-28 LAB — CBC WITH DIFFERENTIAL/PLATELET
BASOS ABS: 0 10*3/uL (ref 0.0–0.1)
BASOS PCT: 0.6 % (ref 0.0–3.0)
Eosinophils Absolute: 0.1 10*3/uL (ref 0.0–0.7)
Eosinophils Relative: 1.2 % (ref 0.0–5.0)
HEMATOCRIT: 45.3 % (ref 36.0–46.0)
HEMOGLOBIN: 15.1 g/dL — AB (ref 12.0–15.0)
LYMPHS ABS: 1.2 10*3/uL (ref 0.7–4.0)
Lymphocytes Relative: 20 % (ref 12.0–46.0)
MCHC: 33.3 g/dL (ref 30.0–36.0)
MCV: 93.1 fl (ref 78.0–100.0)
MONOS PCT: 8.5 % (ref 3.0–12.0)
Monocytes Absolute: 0.5 10*3/uL (ref 0.1–1.0)
Neutro Abs: 4 10*3/uL (ref 1.4–7.7)
Neutrophils Relative %: 69.7 % (ref 43.0–77.0)
PLATELETS: 170 10*3/uL (ref 150.0–400.0)
RBC: 4.86 Mil/uL (ref 3.87–5.11)
RDW: 13 % (ref 11.5–15.5)
WBC: 5.8 10*3/uL (ref 4.0–10.5)

## 2018-04-28 LAB — TSH: TSH: 1.26 u[IU]/mL (ref 0.35–4.50)

## 2018-04-28 LAB — SEDIMENTATION RATE: SED RATE: 27 mm/h (ref 0–30)

## 2018-04-28 MED ORDER — AMLODIPINE BESYLATE 5 MG PO TABS: 5.0000 mg | ORAL_TABLET | Freq: Every day | ORAL | 1 refills | Status: DC

## 2018-04-28 NOTE — Patient Instructions (Addendum)
This could be from   reacting to the surgery you had  should get better soon     temporary shedding . But  Need thyroid testing  And  Dermatology follow up to get more information.  ? Telogen effluvian.   consider counseling about the home situation and stress of care taking.   You will be contacted about  Dermatology appts

## 2018-04-29 ENCOUNTER — Telehealth: Payer: Self-pay | Admitting: Internal Medicine

## 2018-04-29 DIAGNOSIS — R7989 Other specified abnormal findings of blood chemistry: Secondary | ICD-10-CM

## 2018-04-29 LAB — ANA: ANA: NEGATIVE

## 2018-04-29 NOTE — Telephone Encounter (Signed)
Copied from [place] [id number]. Topic: Quick Communication - See Telephone Encounter >> Apr 29, 2018  5:37 PM Rutherford Nail, NT wrote: CRM for notification. See Telephone encounter for: 04/29/18. Patient calling to check on lab results for her thyroid. Please advise.

## 2018-05-02 ENCOUNTER — Telehealth: Payer: Self-pay | Admitting: Family Medicine

## 2018-05-02 DIAGNOSIS — L650 Telogen effluvium: Secondary | ICD-10-CM | POA: Diagnosis not present

## 2018-05-02 NOTE — Telephone Encounter (Signed)
Duplicate note. See other phone note dated for today.

## 2018-05-02 NOTE — Telephone Encounter (Signed)
Please have physician release lab results for this patient. Her primary is Dr. Regis Bill. She is seeing a Dermatologist today at 2:00p and would like the results by that time. Try home # 1st then cell# [phone]. It is okay to leave a message. Routing to PCP

## 2018-05-02 NOTE — Telephone Encounter (Signed)
See my Result Note  

## 2018-05-02 NOTE — Telephone Encounter (Signed)
Tarry Kos, RN     05/02/18 11:32 AM  Note    Please have physician release lab results for this patient. Her primary is Dr. Regis Bill. She is seeing a Dermatologist today at 2:00p and would like the results by that time. Try home # 1st then cell# [phone]. It is okay to leave a message. Routing to PCP

## 2018-05-02 NOTE — Telephone Encounter (Signed)
Can you advise on 04/28/18 labs in PCP absence? Thanks!

## 2018-05-03 NOTE — Telephone Encounter (Signed)
Pt notified of results/instructions and verbalized understanding. She is agreeable to endo referral. Orders placed as directed.

## 2018-05-03 NOTE — Addendum Note (Signed)
Addended by: Dorrene German on: 05/03/2018 09:06 AM   Modules accepted: Orders

## 2018-05-13 ENCOUNTER — Ambulatory Visit (INDEPENDENT_AMBULATORY_CARE_PROVIDER_SITE_OTHER): Payer: Medicare Other | Admitting: Endocrinology

## 2018-05-13 ENCOUNTER — Encounter: Payer: Self-pay | Admitting: Endocrinology

## 2018-05-13 DIAGNOSIS — R946 Abnormal results of thyroid function studies: Secondary | ICD-10-CM

## 2018-05-13 HISTORY — DX: Abnormal results of thyroid function studies: R94.6

## 2018-05-13 NOTE — Progress Notes (Signed)
Subjective:    Patient ID: [name] [name], female    DOB: 1931/09/09, 84 y.o.   MRN: [id number]  HPI Pt is referred by Dr Regis Bill, for hyperthyroxinemia.  This was found 1 month ago (was normal in 2009).  she has never been on thyroid medication.  she has never had XRT to the anterior neck, or thyroid surgery.  she has never had thyroid imaging.  she does not consume kelp or any non-prescribed thyroid medication.  she has never been on amiodarone.  She reports few mos of moderate hair loss on the head, and assoc anxiety.  She takes biotin for this.   Past Medical History:  Diagnosis Date  . Adenomatous colon polyp     4 2008  . Breast cancer ([facility]) 1978   left breast  . Complication of anesthesia   . Cystitis, interstitial   . Fracture    left ankle and rt wrist  . FRACTURE, ANKLE, LEFT 12/14/2006   Qualifier: Diagnosis of  By: Hulan Saas, CMA (AAMA), Quita Skye   . FRACTURE, WRIST 12/14/2006   Qualifier: Diagnosis of  By: Hulan Saas, CMA (AAMA), Quita Skye   . Hyperlipidemia   . Hypertension   . Osteoarthritis   . PONV (postoperative nausea and vomiting)    Episode after Ether  . Rotator cuff tear, right   . Shingles   . Urinary incontinence     Past Surgical History:  Procedure Laterality Date  . ABDOMINAL HYSTERECTOMY  1988   fibroids  . DILATION AND CURETTAGE OF UTERUS     x 2  . MASTECTOMY, PARTIAL  1978   left  . SHOULDER ARTHROSCOPY WITH DISTAL CLAVICLE RESECTION Right 12/23/2017   Procedure: SHOULDER ARTHROSCOPY WITH DISTAL CLAVICLE RESECTION;  Surgeon: Justice Britain, MD;  Location: [facility];  Service: Orthopedics;  Laterality: Right;  . SHOULDER ARTHROSCOPY WITH ROTATOR CUFF REPAIR AND SUBACROMIAL DECOMPRESSION Right 12/23/2017   Procedure: SHOULDER ARTHROSCOPY WITH ROTATOR CUFF REPAIR AND SUBACROMIAL DECOMPRESSION, distal clavicle resection;  Surgeon: Justice Britain, MD;  Location: [facility];  Service: Orthopedics;  Laterality: Right;  . TONSILLECTOMY    . TUBAL LIGATION  77     Social History   Socioeconomic History  . Marital status: Married    Spouse name: Not on file  . Number of children: Not on file  . Years of education: Not on file  . Highest education level: Not on file  Occupational History  . Not on file  Social Needs  . Financial resource strain: Not on file  . Food insecurity:    Worry: Not on file    Inability: Not on file  . Transportation needs:    Medical: Not on file    Non-medical: Not on file  Tobacco Use  . Smoking status: Former Research scientist (life sciences)  . Smokeless tobacco: Never Used  . 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 Alcohol use: Yes    Comment: none at this time   . Drug use: No  . Sexual activity: Not on file  Lifestyle  . Physical activity:    Days per week: Not on file    Minutes per session: Not on file  . Stress: Not on file  Relationships  . Social connections:    Talks on phone: Not on file    Gets together: Not on file    Attends religious service: Not on file    Active member  of club or organization: Not on file    Attends meetings of clubs or organizations: Not on file    Relationship status: Not on file  . Intimate partner violence:    Fear of current or ex partner: Not on file    Emotionally abused: Not on file    Physically abused: Not on file    Forced sexual activity: Not on file  Other Topics Concern  . Not on file  Social History Narrative   Retired   Married   [place] of 2   Husband with medical problems   g5 p3   Ex smoker  Quit 33 years ago  [place]    Current Outpatient Medications on File Prior to Visit  Medication Sig Dispense Refill  . amLODipine (NORVASC) 5 MG tablet Take 1 tablet (5 mg total) by mouth daily. 90 tablet 1  . Ascorbic Acid (VITAMIN C) 500 MG tablet Take 500 mg by mouth 2 (two) times daily.     . Calcium Carb-Cholecalciferol (CALCIUM 600+D3) 600-800 MG-UNIT TABS Take 1 tablet by mouth daily.    Marland Kitchen CINNAMON  PO Take 1,000 mg by mouth 2 (two) times daily.     Marland Kitchen doxylamine, Sleep, (UNISOM) 25 MG tablet Take 25 mg by mouth at bedtime as needed for sleep.    . fish oil-omega-3 fatty acids 1000 MG capsule Take 1 g by mouth 2 (two) times daily.     Marland Kitchen loratadine (CLARITIN) 10 MG tablet Take 10 mg by mouth daily as needed for allergies.    . Magnesium 400 MG TABS Take 400 mg by mouth daily.     . Multiple Vitamins-Minerals (CENTRUM SILVER PO) Take 1 tablet by mouth daily.     . Vitamin D, Cholecalciferol, 1000 UNITS TABS Take 1,000 Units by mouth daily.      No current facility-administered medications on file prior to visit.     Allergies  Allergen Reactions  . Nitrofurantoin Itching    Head to toe itching  . Sulfamethoxazole     Unknown reaction     Family History  Problem Relation Age of Onset  . Suicidality Mother   . Heart attack Father   . Cancer - Other Daughter        throat cancer   . Thyroid disease Neg Hx     BP 104/70 (BP Location: Left Arm)   Pulse 80   Ht 5' 2.5" (1.588 m)   Wt 122 lb (55.3 kg)   SpO2 95%   BMI 21.96 kg/m    Review of Systems denies weight loss, headache, hoarseness, visual loss, palpitations, sob, diarrhea, polyuria, muscle weakness, edema, tremor, heat intolerance, and rhinorrhea. She has excessive diaphoresis, easy bruising, and anxiety.      Objective:   Physical Exam VS: see vs page GEN: no distress HEAD: head: no deformity eyes: no periorbital swelling, no proptosis external nose and ears are normal mouth: no lesion seen NECK: supple, thyroid is not enlarged CHEST WALL: no deformity LUNGS: clear to auscultation CV: reg rate and rhythm, no murmur ABD: abdomen is soft, nontender.  no hepatosplenomegaly.  not distended.  no hernia MUSCULOSKELETAL: muscle bulk and strength are grossly normal.  no obvious joint swelling.  gait is normal and steady EXTEMITIES: no deformity.  no edema PULSES: no carotid bruit NEURO:  cn 2-12 grossly intact.    readily moves all 4's.  sensation is intact to touch on all 4's.  No tremor SKIN:  Normal texture and temperature.  No rash or suspicious lesion is visible.  Not diaphoretic NODES:  None palpable at the neck PSYCH: alert, well-oriented.  Does not appear anxious nor depressed.     Lab Results  Component Value Date   TSH 1.26 04/28/2018   I have reviewed outside records, and summarized: Pt was noted to have elevated T4, and referred here.  She was recently seen for acute visit, and reported hair loss.       Assessment & Plan:  Abnormal T4 measurement, new to me.  prob due to biotin  Patient Instructions  On a trial basis, please stop taking the biotin and the other non-prescription products.   Please redo the thyroid blood tests at the [facility] office next week.   I would be happy to see you back here as needed.

## 2018-05-13 NOTE — Patient Instructions (Addendum)
On a trial basis, please stop taking the biotin and the other non-prescription products.   Please redo the thyroid blood tests at the [place] office next week.   I would be happy to see you back here as needed.

## 2018-05-16 ENCOUNTER — Telehealth: Payer: Self-pay | Admitting: Endocrinology

## 2018-05-16 NOTE — Telephone Encounter (Signed)
Pt called in. She has a few questions based on her appointment last week. Requesting a call back

## 2018-05-16 NOTE — Telephone Encounter (Signed)
Spoke to pt and cleared up her concerns, she will be in Friday for her labs.

## 2018-05-20 ENCOUNTER — Other Ambulatory Visit (INDEPENDENT_AMBULATORY_CARE_PROVIDER_SITE_OTHER): Payer: Medicare Other

## 2018-05-20 DIAGNOSIS — R946 Abnormal results of thyroid function studies: Secondary | ICD-10-CM | POA: Diagnosis not present

## 2018-05-20 LAB — TSH: TSH: 1.85 u[IU]/mL (ref 0.35–4.50)

## 2018-05-20 LAB — T3, FREE: T3, Free: 3.3 pg/mL (ref 2.3–4.2)

## 2018-05-20 LAB — T4, FREE: FREE T4: 0.81 ng/dL (ref 0.60–1.60)

## 2018-05-23 ENCOUNTER — Telehealth: Payer: Self-pay | Admitting: Endocrinology

## 2018-05-23 NOTE — Telephone Encounter (Signed)
Pt would like a call back to discuss lab results.  Please advise

## 2018-05-24 ENCOUNTER — Telehealth: Payer: Self-pay

## 2018-05-24 NOTE — Telephone Encounter (Signed)
Called pt to inform of lab results. Verbalized acceptance and understanding.

## 2018-05-24 NOTE — Telephone Encounter (Signed)
-----   Message from Renato Shin, MD sent at 05/20/2018  6:24 PM EDT ----- please call patient: Normal--good.  I hope you feel well.

## 2018-05-25 ENCOUNTER — Telehealth: Payer: Self-pay | Admitting: Internal Medicine

## 2018-05-25 NOTE — Telephone Encounter (Signed)
Lab Results  Component Value Date   TSH 1.85 05/20/2018   Ref Range & Units 5d ago 3wk ago 82yr ago   Free T4 0.60 - 1.60 ng/dL 0.81  1.91High  CM 0.8     Ref Range & Units 5d ago  T3, Free 2.3 - 4.2 pg/mL 3.3    Please advise Dr Regis Bill, thanks.

## 2018-05-25 NOTE — Telephone Encounter (Signed)
Not sure how to answer this message  Her last tests were normal.  coubt if the vitamins causing the problem but can stop them  If needed   Has she had a dermatology appt yet?  I would need to see the notes for this  .

## 2018-05-25 NOTE — Telephone Encounter (Signed)
Copied from [place] [id number]. Topic: General - Other >> May 25, 2018 11:33 AM Lennox Solders wrote: Reason for CRM: pt saw dr Regis Bill on 04-28-18 . Pt is calling her tsh is ok she is losing her hair at an alarm rate. Pt is taking vitamin and does not know if vitamin is contributing to her hair loss. Pt is very concern. Pt has seen dermatologist  Without any  results

## 2018-05-27 NOTE — Telephone Encounter (Signed)
I reviewed dderm consult , I dont have more to offer except what dermatology said  .    in case she didn't understand    Derm assess ment can make another appt so we can  Go over the  Consults and options  .   And or  She  can call derm back up and ask for help.

## 2018-05-27 NOTE — Telephone Encounter (Signed)
Dermatology notes given to Dr Regis Bill. Please advise if any further recommendations. Thanks.

## 2018-06-01 NOTE — Telephone Encounter (Signed)
ATC line busy 

## 2018-06-14 ENCOUNTER — Ambulatory Visit: Payer: Medicare Other | Admitting: Internal Medicine

## 2018-06-16 DIAGNOSIS — R8279 Other abnormal findings on microbiological examination of urine: Secondary | ICD-10-CM | POA: Diagnosis not present

## 2018-06-16 DIAGNOSIS — R300 Dysuria: Secondary | ICD-10-CM | POA: Diagnosis not present

## 2018-07-08 DIAGNOSIS — N3010 Interstitial cystitis (chronic) without hematuria: Secondary | ICD-10-CM | POA: Diagnosis not present

## 2018-07-08 DIAGNOSIS — R300 Dysuria: Secondary | ICD-10-CM | POA: Diagnosis not present

## 2018-07-08 DIAGNOSIS — R351 Nocturia: Secondary | ICD-10-CM | POA: Diagnosis not present

## 2018-07-21 ENCOUNTER — Ambulatory Visit: Payer: Medicare Other

## 2018-07-29 ENCOUNTER — Ambulatory Visit: Payer: Medicare Other

## 2018-08-30 ENCOUNTER — Ambulatory Visit: Payer: Medicare Other

## 2018-08-30 NOTE — Progress Notes (Addendum)
Subjective:   [name] [name] is a 85 y.o. female who presents for Medicare Annual (Subsequent) preventive examination.  Review of Systems:  No ROS.  Medicare Wellness Visit. Additional risk factors are reflected in the social history.  Cardiac Risk Factors include: advanced age (>74men, >101 women) Sleep patterns: has interrupted sleep, feels rested on waking and gets up 4 times nightly to void.  States this does not affect her quality of life, however. Author reviewed kegel exercises and pt. Stated "oh yeah, my daughter who is an EMT mentioned those. I don't know, maybe I'll try them".   Home Safety/Smoke Alarms: Feels safe in home. Smoke alarms in place.  Living environment; residence and Firearm Safety: 1-story house/ trailer. Seat Belt Safety/Bike Helmet: Wears seat belt.   Female:   Pap- N/A, hysterectomy       Mammo- hx breast CA, states she no longer receives mammograms, over 80yo       Dexa scan- pt. Refused at this time        CCS- N/A, pt over 80yo.      Objective:     Vitals: BP 118/70 (BP Location: Right Arm, Patient Position: Sitting, Cuff Size: Normal)   Pulse 74   Ht 5' 2.5" (1.588 m)   Wt 126 lb (57.2 kg)   SpO2 96%   BMI 22.68 kg/m   Body mass index is 22.68 kg/m.  Advanced Directives 08/31/2018 12/17/2017 07/20/2017 11/05/2014  Does Patient Have a Medical Advance Directive? Yes Yes Yes Yes  Type of Advance Directive Living will;Healthcare Power of Attorney Out of facility DNR (pink MOST or yellow form) - -  Does patient want to make changes to medical advance directive? No - Patient declined No - Patient declined - -  Copy of [place] in Chart? 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 Adenomatous colon polyp     4 2008  . Breast cancer ([facility]) 1978   left breast  . Complication of anesthesia   . Cystitis, interstitial   . Fracture    left ankle and rt wrist  . FRACTURE, ANKLE, LEFT 12/14/2006   Qualifier: Diagnosis of  By: Hulan Saas, CMA (AAMA), Quita Skye   . FRACTURE, WRIST 12/14/2006   Qualifier: Diagnosis of  By: Hulan Saas, CMA (AAMA), Quita Skye   . Hyperlipidemia   . Hypertension   . Osteoarthritis   . PONV (postoperative nausea and vomiting)    Episode after Ether  . Rotator cuff tear, right   . Shingles   . Urinary incontinence    Past Surgical History:  Procedure Laterality Date  . ABDOMINAL HYSTERECTOMY  1988   fibroids  . DILATION AND CURETTAGE OF UTERUS     x 2  . MASTECTOMY, PARTIAL  1978   left  . SHOULDER ARTHROSCOPY WITH DISTAL CLAVICLE RESECTION Right 12/23/2017   Procedure: SHOULDER ARTHROSCOPY WITH DISTAL CLAVICLE RESECTION;  Surgeon: Justice Britain, MD;  Location: [facility];  Service: Orthopedics;  Laterality: Right;  . SHOULDER ARTHROSCOPY WITH ROTATOR CUFF REPAIR AND SUBACROMIAL DECOMPRESSION Right 12/23/2017   Procedure: SHOULDER ARTHROSCOPY WITH  ROTATOR CUFF REPAIR AND SUBACROMIAL DECOMPRESSION, distal clavicle resection;  Surgeon: Justice Britain, MD;  Location: [facility];  Service: Orthopedics;  Laterality: Right;  . TONSILLECTOMY    . TUBAL LIGATION  77   Family History  Problem Relation Age of Onset  . Suicidality Mother   . Heart attack Father   . Cancer - Other Daughter        throat cancer   . Thyroid disease Neg Hx    Social History   Socioeconomic History  . Marital status: Married    Spouse name: Not on file  . Number of children: 3  . Years of education: Not on file  . 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 Financial resource strain: Not hard at all  . Food insecurity:    Worry: Never true     Inability: Never true  . Transportation needs:    Medical: No    Non-medical: No  Tobacco Use  . Smoking status: Former Research scientist (life sciences)  . Smokeless tobacco: Never Used  . 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 Alcohol use: Yes    Comment: none at this time   . Drug use: No  . Sexual activity: Not Currently  Lifestyle  . Physical activity:    Days per week: 0 days    Minutes per session: 0 min  . Stress: Rather much  Relationships  . Social connections:    Talks on phone: Twice a week    Gets together: Once a week    Attends religious service: More than 4 times per year    Active member of club or organization: No    Attends meetings of clubs or organizations: Never    Relationship status: Married  Other Topics Concern  . Not on file  Social History Narrative   Retired   Married   [place] of 2   Husband with medical problems   g5 p3   Ex smoker  Quit 33 years ago  1952- 1968      08/31/2018: Has 3 children, one local son who can help occasionally if needed   Struggling with taking care of husband at home who is partially blind, but pt. not interested in caregiver or respite support   Enjoys reading, walking around the house.    Outpatient Encounter Medications as of 08/31/2018  Medication Sig  . amLODipine (NORVASC) 5 MG tablet Take 1 tablet (5 mg total) by mouth daily.  Marland Kitchen doxylamine, Sleep, (UNISOM) 25 MG tablet Take 25 mg by mouth at bedtime as needed for sleep.  . Multiple Vitamins-Minerals (CENTRUM SILVER PO) Take 1 tablet by mouth daily.   . Ascorbic Acid (VITAMIN C) 500 MG tablet Take 500 mg by mouth 2 (two) times daily.   . Calcium Carb-Cholecalciferol (CALCIUM 600+D3) 600-800 MG-UNIT TABS Take 1 tablet by mouth daily.  Marland Kitchen CINNAMON PO Take 1,000 mg by mouth 2 (two) times daily.   . fish oil-omega-3 fatty acids 1000 MG capsule Take 1 g by mouth 2 (two) times daily.   Marland Kitchen loratadine (CLARITIN) 10 MG tablet Take  10 mg by mouth daily as needed for allergies.  . Magnesium 400 MG TABS Take 400 mg by mouth daily.   . Vitamin D, Cholecalciferol, 1000 UNITS TABS Take 1,000 Units by mouth daily.    No facility-administered encounter  medications on file as of 08/31/2018.     Activities of Daily Living In your present state of health, do you have any difficulty performing the following activities: 08/31/2018 02/23/2018  Hearing? Tempie Donning  Vision? N N  Difficulty concentrating or making decisions? N N  Walking or climbing stairs? N N  Dressing or bathing? N N  Doing errands, shopping? N N  Preparing Food and eating ? N -  Using the Toilet? N -  In the past six months, have you accidently leaked urine? Y -  Comment denies concerns, OK with just using incontinence pads -  Do you have problems with loss of bowel control? N -  Managing your Medications? N -  Managing your Finances? N -  Housekeeping or managing your Housekeeping? Y -  Comment pt. open to having help with repairs, resources provided. -  Some recent data might be hidden    Patient Care Team: [name], Standley Brooking, MD as PCP - General Franchot Gallo, MD (Urology)    Assessment:   This is a routine wellness examination for [place]. Physical assessment deferred to PCP.   Exercise Activities and Dietary recommendations Current Exercise Habits: The patient does not participate in regular exercise at present, Exercise limited by: psychological condition(s);orthopedic condition(s);cardiac condition(s) Diet (meal preparation, eat out, water intake, caffeinated beverages, dairy products, fruits and vegetables): in general, a "healthy" diet  , mindful of sugar intake. Is happy with her current weight, but "doesn't want to gain anymore". Encouraged good hydration.  Goals    . Patient Stated     Will fup on getting a hearing test     . Patient Stated     Keep watching sugar in diet and follow up with hearing aide/audiology resources       Fall  Risk Fall Risk  08/31/2018 02/23/2018 07/20/2017 01/13/2017 11/19/2015  Falls in the past year? 1 Yes No No No  Number falls in past yr: 1 1 - - -  Injury with Fall? - Yes - - -  Comment - pt had shoulder surgery as result - - -  Risk for fall due to : Impaired balance/gait;History of fall(s);Impaired vision - - - -  Follow up Falls prevention discussed - - - -     Depression Screen PHQ 2/9 Scores 08/31/2018 02/23/2018 07/20/2017 01/13/2017  PHQ - 2 Score 1 0 0 0  PHQ- 9 Score 4 - - -     Cognitive Function MMSE - Mini Mental State Exam 07/20/2017  Not completed: (No Data)       Ad8 score reviewed for issues:  Issues making decisions: no  Less interest in hobbies / activities: no  Repeats questions, stories (family complaining): yes  Trouble using ordinary gadgets (microwave, computer, phone):no  Forgets the month or year: no  Mismanaging finances: no  Remembering appts: no  Daily problems with thinking and/or memory: yes Ad8 score is= 2    Immunization History  Administered Date(s) Administered  . Influenza Split 04/21/2011, 04/26/2012  . Influenza Whole 05/10/2008, 05/01/2009, 04/15/2010  . Influenza, High Dose Seasonal PF 05/01/2014, 05/14/2015, 04/23/2016, 05/03/2017, 04/28/2018  . Influenza,inj,Quad PF,6+ Mos 05/02/2013  . Pneumococcal Conjugate-13 10/27/2013  . Pneumococcal Polysaccharide-23 08/03/1998, 02/28/2008  . Td 08/03/1994, 02/08/2007  . Tdap 08/02/2017    Qualifies for Shingles Vaccine? Instructed to follow up with local pharmacist.  Screening Tests Health Maintenance  Topic Date Due  . DEXA SCAN  09/27/1998  . TETANUS/TDAP  08/03/2027  . INFLUENZA VACCINE  Completed  . PNA vac Low Risk Adult  Completed       Plan:    Bring a copy of your living will and/or healthcare power of attorney to your next office visit.  Refer to audiology/hearing aide resources provided.  Follow up with your local pharmacist about receiving your shingles  vaccine (shingrix).  Refer to caregiver and financial support resources provided that will help you with your caregiver and household needs   I have personally reviewed and noted the following in the patient's chart:   . Medical and social history . Use of alcohol, tobacco or illicit drugs  . Current medications and supplements . Functional ability and status . Nutritional status . Physical activity . Advanced directives . List of other physicians . Vitals . Screenings to include cognitive, depression, and falls . Referrals and appointments  In addition, I have reviewed and discussed with patient certain preventive protocols, quality metrics, and best practice recommendations. A written personalized care plan for preventive services as well as general preventive health recommendations were provided to patient.     Alphia Moh, RN  08/31/2018    Above noted reviewed and agree. Shanon Ace, MD

## 2018-08-31 ENCOUNTER — Ambulatory Visit (INDEPENDENT_AMBULATORY_CARE_PROVIDER_SITE_OTHER): Payer: Medicare Other

## 2018-08-31 VITALS — BP 118/70 | HR 74 | Ht 62.5 in | Wt 126.0 lb

## 2018-08-31 DIAGNOSIS — Z0000 Encounter for general adult medical examination without abnormal findings: Secondary | ICD-10-CM

## 2018-08-31 NOTE — Patient Instructions (Addendum)
Bring a copy of your living will and/or healthcare power of attorney to your next office visit.  Refer to audiology/hearing aide resources provided.  Follow up with your local pharmacist about receiving your shingles vaccine (shingrix).  Refer to caregiver and financial support resources that will help you with your caregiver and household needs   [name] [name] , Thank you for taking time to come for your Medicare Wellness Visit. I appreciate your ongoing commitment to your health goals. Please review the following plan we discussed and let me know if I can assist you in the future.   These are the goals we discussed: Goals    . Patient Stated     Will fup on getting a hearing test     . Patient Stated     Keep watching sugar in diet and follow up with hearing aide/audiology resources       This is a list of the screening recommended for you and due dates:  Health Maintenance  Topic Date Due  . DEXA scan (bone density measurement)  09/27/1998  . Tetanus Vaccine  08/03/2027  . Flu Shot  Completed  . Pneumonia vaccines  Completed    Kegel Exercises Kegel exercises help strengthen the muscles that support the rectum, vagina, small intestine, bladder, and uterus. Doing Kegel exercises can help:  Improve bladder and bowel control.  Improve sexual response.  Reduce problems and discomfort during pregnancy. Kegel exercises involve squeezing your pelvic floor muscles, which are the same muscles you squeeze when you try to stop the flow of urine. The exercises can be done while sitting, standing, or lying down, but it is best to vary your position. Exercises 1. Squeeze your pelvic floor muscles tight. You should feel a tight lift in your rectal area. If you are a female, you should also feel a tightness in your vaginal area. Keep your stomach, buttocks, and legs relaxed. 2. Hold the muscles tight for up to 10 seconds. 3. Relax your muscles. Repeat this exercise 50 times a day or as many  times as told by your health care provider. Continue to do this exercise for at least 4-6 weeks or for as long as told by your health care provider. This information is not intended to replace advice given to you by your health care provider. Make sure you discuss any questions you have with your health care provider. Document Released: 07/06/2012 Document Revised: 11/30/2016 Document Reviewed: 06/09/2015 Elsevier Interactive Patient Education  2019 [place] Maintenance, Female Adopting a healthy lifestyle and getting preventive care can go a long way to promote health and wellness. Talk with your health care provider about what schedule of regular examinations is right for you. This is a good chance for you to check in with your provider about disease prevention and staying healthy. In between checkups, there are plenty of things you can do on your own. Experts have done a lot of research about which lifestyle changes and preventive measures are most likely to keep you healthy. Ask your health care provider for more information. Weight and diet Eat a healthy diet  Be sure to include plenty of vegetables, fruits, low-fat dairy products, and lean protein.  Do not eat a lot of foods high in solid fats, added sugars, or salt.  Get regular exercise. This is one of the most important things you can do for your health. ? Most adults should exercise for at least 150 minutes each week. The exercise  should increase your heart rate and make you sweat (moderate-intensity exercise). ? Most adults should also do strengthening exercises at least twice a week. This is in addition to the moderate-intensity exercise. Maintain a healthy weight  Body mass index (BMI) is a measurement that can be used to identify possible weight problems. It estimates body fat based on height and weight. Your health care provider can help determine your BMI and help you achieve or maintain a healthy weight.  For  females 16 years of age and older: ? A BMI below 18.5 is considered underweight. ? A BMI of 18.5 to 24.9 is normal. ? A BMI of 25 to 29.9 is considered overweight. ? A BMI of 30 and above is considered obese. Watch levels of cholesterol and blood lipids  You should start having your blood tested for lipids and cholesterol at 83 years of age, then have this test every 5 years.  You may need to have your cholesterol levels checked more often if: ? Your lipid or cholesterol levels are high. ? You are older than 83 years of age. ? You are at high risk for heart disease. Cancer screening Lung Cancer  Lung cancer screening is recommended for adults 71-66 years old who are at high risk for lung cancer because of a history of smoking.  A yearly low-dose CT scan of the lungs is recommended for people who: ? Currently smoke. ? Have quit within the past 15 years. ? Have at least a 30-pack-year history of smoking. A pack year is smoking an average of one pack of cigarettes a day for 1 year.  Yearly screening should continue until it has been 15 years since you quit.  Yearly screening should stop if you develop a health problem that would prevent you from having lung cancer treatment. Breast Cancer  Practice breast self-awareness. This means understanding how your breasts normally appear and feel.  It also means doing regular breast self-exams. Let your health care provider know about any changes, no matter how small.  If you are in your 20s or 30s, you should have a clinical breast exam (CBE) by a health care provider every 1-3 years as part of a regular health exam.  If you are 8 or older, have a CBE every year. Also consider having a breast X-ray (mammogram) every year.  If you have a family history of breast cancer, talk to your health care provider about genetic screening.  If you are at high risk for breast cancer, talk to your health care provider about having an MRI and a mammogram  every year.  Breast cancer gene (BRCA) assessment is recommended for women who have family members with BRCA-related cancers. BRCA-related cancers include: ? Breast. ? Ovarian. ? Tubal. ? Peritoneal cancers.  Results of the assessment will determine the need for genetic counseling and BRCA1 and BRCA2 testing. Cervical Cancer Your health care provider may recommend that you be screened regularly for cancer of the pelvic organs (ovaries, uterus, and vagina). This screening involves a pelvic examination, including checking for microscopic changes to the surface of your cervix (Pap test). You may be encouraged to have this screening done every 3 years, beginning at age 27.  For women ages 59-65, health care providers may recommend pelvic exams and Pap testing every 3 years, or they may recommend the Pap and pelvic exam, combined with testing for human papilloma virus (HPV), every 5 years. Some types of HPV increase your risk of cervical cancer. Testing for  HPV may also be done on women of any age with unclear Pap test results.  Other health care providers may not recommend any screening for nonpregnant women who are considered low risk for pelvic cancer and who do not have symptoms. Ask your health care provider if a screening pelvic exam is right for you.  If you have had past treatment for cervical cancer or a condition that could lead to cancer, you need Pap tests and screening for cancer for at least 20 years after your treatment. If Pap tests have been discontinued, your risk factors (such as having a new sexual partner) need to be reassessed to determine if screening should resume. Some women have medical problems that increase the chance of getting cervical cancer. In these cases, your health care provider may recommend more frequent screening and Pap tests. Colorectal Cancer  This type of cancer can be detected and often prevented.  Routine colorectal cancer screening usually begins at 83  years of age and continues through 83 years of age.  Your health care provider may recommend screening at an earlier age if you have risk factors for colon cancer.  Your health care provider may also recommend using home test kits to check for hidden blood in the stool.  A small camera at the end of a tube can be used to examine your colon directly (sigmoidoscopy or colonoscopy). This is done to check for the earliest forms of colorectal cancer.  Routine screening usually begins at age 61.  Direct examination of the colon should be repeated every 5-10 years through 83 years of age. However, you may need to be screened more often if early forms of precancerous polyps or small growths are found. Skin Cancer  Check your skin from head to toe regularly.  Tell your health care provider about any new moles or changes in moles, especially if there is a change in a mole's shape or color.  Also tell your health care provider if you have a mole that is larger than the size of a pencil eraser.  Always use sunscreen. Apply sunscreen liberally and repeatedly throughout the day.  Protect yourself by wearing long sleeves, pants, a wide-brimmed hat, and sunglasses whenever you are outside. Heart disease, diabetes, and high blood pressure  High blood pressure causes heart disease and increases the risk of stroke. High blood pressure is more likely to develop in: ? People who have blood pressure in the high end of the normal range (130-139/85-89 mm Hg). ? People who are overweight or obese. ? People who are African American.  If you are 59-72 years of age, have your blood pressure checked every 3-5 years. If you are 57 years of age or older, have your blood pressure checked every year. You should have your blood pressure measured twice-once when you are at a hospital or clinic, and once when you are not at a hospital or clinic. Record the average of the two measurements. To check your blood pressure when  you are not at a hospital or clinic, you can use: ? An automated blood pressure machine at a pharmacy. ? A home blood pressure monitor.  If you are between 67 years and 8 years old, ask your health care provider if you should take aspirin to prevent strokes.  Have regular diabetes screenings. This involves taking a blood sample to check your fasting blood sugar level. ? If you are at a normal weight and have a low risk for diabetes, have this  test once every three years after 83 years of age. ? If you are overweight and have a high risk for diabetes, consider being tested at a younger age or more often. Preventing infection Hepatitis B  If you have a higher risk for hepatitis B, you should be screened for this virus. You are considered at high risk for hepatitis B if: ? You were born in a country where hepatitis B is common. Ask your health care provider which countries are considered high risk. ? Your parents were born in a high-risk country, and you have not been immunized against hepatitis B (hepatitis B vaccine). ? You have HIV or AIDS. ? You use needles to inject street drugs. ? You live with someone who has hepatitis B. ? You have had sex with someone who has hepatitis B. ? You get hemodialysis treatment. ? You take certain medicines for conditions, including cancer, organ transplantation, and autoimmune conditions. Hepatitis C  Blood testing is recommended for: ? Everyone born from [birth year] through 1963. ? Anyone with known risk factors for hepatitis C. Sexually transmitted infections (STIs)  You should be screened for sexually transmitted infections (STIs) including gonorrhea and chlamydia if: ? You are sexually active and are younger than 83 years of age. ? You are older than 83 years of age and your health care provider tells you that you are at risk for this type of infection. ? Your sexual activity has changed since you were last screened and you are at an increased risk for  chlamydia or gonorrhea. Ask your health care provider if you are at risk.  If you do not have HIV, but are at risk, it may be recommended that you take a prescription medicine daily to prevent HIV infection. This is called pre-exposure prophylaxis (PrEP). You are considered at risk if: ? You are sexually active and do not regularly use condoms or know the HIV status of your partner(s). ? You take drugs by injection. ? You are sexually active with a partner who has HIV. Talk with your health care provider about whether you are at high risk of being infected with HIV. If you choose to begin PrEP, you should first be tested for HIV. You should then be tested every 3 months for as long as you are taking PrEP. Pregnancy  If you are premenopausal and you may become pregnant, ask your health care provider about preconception counseling.  If you may become pregnant, take 400 to 800 micrograms (mcg) of folic acid every day.  If you want to prevent pregnancy, talk to your health care provider about birth control (contraception). Osteoporosis and menopause  Osteoporosis is a disease in which the bones lose minerals and strength with aging. This can result in serious bone fractures. Your risk for osteoporosis can be identified using a bone density scan.  If you are 90 years of age or older, or if you are at risk for osteoporosis and fractures, ask your health care provider if you should be screened.  Ask your health care provider whether you should take a calcium or vitamin D supplement to lower your risk for osteoporosis.  Menopause may have certain physical symptoms and risks.  Hormone replacement therapy may reduce some of these symptoms and risks. Talk to your health care provider about whether hormone replacement therapy is right for you. Follow these instructions at home:  Schedule regular health, dental, and eye exams.  Stay current with your immunizations.  Do not use any tobacco products  including cigarettes, chewing tobacco, or electronic cigarettes.  If you are pregnant, do not drink alcohol.  If you are breastfeeding, limit how much and how often you drink alcohol.  Limit alcohol intake to no more than 1 drink per day for nonpregnant women. One drink equals 12 ounces of beer, 5 ounces of wine, or 1 ounces of hard liquor.  Do not use street drugs.  Do not share needles.  Ask your health care provider for help if you need support or information about quitting drugs.  Tell your health care provider if you often feel depressed.  Tell your health care provider if you have ever been abused or do not feel safe at home. This information is not intended to replace advice given to you by your health care provider. Make sure you discuss any questions you have with your health care provider. Document Released: 02/02/2011 Document Revised: 12/26/2015 Document Reviewed: 04/23/2015 Elsevier Interactive Patient Education  2019 [employer].   Hearing Loss  Hearing loss is a partial or total loss of the ability to hear. This can be temporary or permanent, and it can happen in one or both ears. Hearing loss may be referred to as deafness. Medical care is necessary to treat hearing loss properly and to prevent the condition from getting worse. Your hearing may partially or completely come back, depending on what caused your hearing loss and how severe it is. In some cases, hearing loss is permanent. What are the causes? Common causes of hearing loss include:  Too much wax in the ear canal.  Infection of the ear canal or middle ear.  Fluid in the middle ear.  Injury to the ear or surrounding area.  An object stuck in the ear.  Prolonged exposure to loud sounds, such as music. Less common causes of hearing loss include:  Tumors in the ear.  Viral or bacterial infections, such as meningitis.  A hole in the eardrum (perforated eardrum).  Problems with the hearing nerve  that sends signals between the brain and the ear.  Certain medicines. What are the signs or symptoms? Symptoms of this condition may include:  Difficulty telling the difference between sounds.  Difficulty following a conversation when there is background noise.  Lack of response to sounds in your environment. This may be most noticeable when you do not respond to startling sounds.  Needing to turn up the volume on the television, radio, etc.  Ringing in the ears.  Dizziness.  Pain in the ears. How is this diagnosed? This condition is diagnosed based on a physical exam and a hearing test (audiometry). The audiometry test will be performed by a hearing specialist (audiologist). You may also be referred to an ear, nose, and throat (ENT) specialist (otolaryngologist). How is this treated? Treatment for recent onset of hearing loss may include:  Ear wax removal.  Being prescribed medicines to prevent infection (antibiotics).  Being prescribed medicines to reduce inflammation (corticosteroids). Follow these instructions at home:  If you were prescribed an antibiotic medicine, take it as told by your health care provider. Do not stop taking the antibiotic even if you start to feel better.  Take over-the-counter and prescription medicines only as told by your health care provider.  Avoid loud noises.  Return to your normal activities as told by your health care provider. Ask your health care provider what activities are safe for you.  Keep all follow-up visits as told by your health care provider. This is important. Contact a  health care provider if:  You feel dizzy.  You develop new symptoms.  You vomit or feel nauseous.  You have a fever. Get help right away if:  You develop sudden changes in your vision.  You have severe ear pain.  You have new or increased weakness.  You have a severe headache. This information is not intended to replace advice given to you by  your health care provider. Make sure you discuss any questions you have with your health care provider. Document Released: 07/20/2005 Document Revised: 12/26/2015 Document Reviewed: 12/05/2014 Elsevier Interactive Patient Education  2019 [employer].

## 2019-01-21 ENCOUNTER — Other Ambulatory Visit: Payer: Self-pay | Admitting: Internal Medicine

## 2019-01-25 ENCOUNTER — Other Ambulatory Visit: Payer: Self-pay | Admitting: Internal Medicine

## 2019-03-03 ENCOUNTER — Ambulatory Visit (INDEPENDENT_AMBULATORY_CARE_PROVIDER_SITE_OTHER): Payer: Medicare Other | Admitting: Internal Medicine

## 2019-03-03 ENCOUNTER — Encounter: Payer: Self-pay | Admitting: Internal Medicine

## 2019-03-03 ENCOUNTER — Other Ambulatory Visit: Payer: Self-pay

## 2019-03-03 VITALS — BP 116/62 | HR 66 | Temp 98.7°F | Ht 62.5 in | Wt 121.8 lb

## 2019-03-03 DIAGNOSIS — Z79899 Other long term (current) drug therapy: Secondary | ICD-10-CM | POA: Diagnosis not present

## 2019-03-03 DIAGNOSIS — H9193 Unspecified hearing loss, bilateral: Secondary | ICD-10-CM

## 2019-03-03 DIAGNOSIS — E785 Hyperlipidemia, unspecified: Secondary | ICD-10-CM

## 2019-03-03 DIAGNOSIS — R7301 Impaired fasting glucose: Secondary | ICD-10-CM | POA: Diagnosis not present

## 2019-03-03 DIAGNOSIS — Z853 Personal history of malignant neoplasm of breast: Secondary | ICD-10-CM

## 2019-03-03 DIAGNOSIS — I10 Essential (primary) hypertension: Secondary | ICD-10-CM

## 2019-03-03 DIAGNOSIS — R7989 Other specified abnormal findings of blood chemistry: Secondary | ICD-10-CM | POA: Diagnosis not present

## 2019-03-03 LAB — CBC WITH DIFFERENTIAL/PLATELET
Basophils Absolute: 0 10*3/uL (ref 0.0–0.1)
Basophils Relative: 0.8 % (ref 0.0–3.0)
Eosinophils Absolute: 0.1 10*3/uL (ref 0.0–0.7)
Eosinophils Relative: 1.8 % (ref 0.0–5.0)
HCT: 44.3 % (ref 36.0–46.0)
Hemoglobin: 14.9 g/dL (ref 12.0–15.0)
Lymphocytes Relative: 20.5 % (ref 12.0–46.0)
Lymphs Abs: 1.1 10*3/uL (ref 0.7–4.0)
MCHC: 33.7 g/dL (ref 30.0–36.0)
MCV: 94.2 fl (ref 78.0–100.0)
Monocytes Absolute: 0.5 10*3/uL (ref 0.1–1.0)
Monocytes Relative: 8.8 % (ref 3.0–12.0)
Neutro Abs: 3.8 10*3/uL (ref 1.4–7.7)
Neutrophils Relative %: 68.1 % (ref 43.0–77.0)
Platelets: 170 10*3/uL (ref 150.0–400.0)
RBC: 4.7 Mil/uL (ref 3.87–5.11)
RDW: 13.2 % (ref 11.5–15.5)
WBC: 5.5 10*3/uL (ref 4.0–10.5)

## 2019-03-03 LAB — BASIC METABOLIC PANEL
BUN: 23 mg/dL (ref 6–23)
CO2: 27 mEq/L (ref 19–32)
Calcium: 9.5 mg/dL (ref 8.4–10.5)
Chloride: 108 mEq/L (ref 96–112)
Creatinine, Ser: 0.95 mg/dL (ref 0.40–1.20)
GFR: 55.85 mL/min — ABNORMAL LOW (ref >60.00)
Glucose, Bld: 116 mg/dL — ABNORMAL HIGH (ref 70–99)
Potassium: 4.6 mEq/L (ref 3.5–5.1)
Sodium: 143 mEq/L (ref 135–145)

## 2019-03-03 LAB — HEPATIC FUNCTION PANEL
ALT: 19 U/L (ref 0–35)
AST: 18 U/L (ref 0–37)
Albumin: 4.8 g/dL (ref 3.5–5.2)
Alkaline Phosphatase: 66 U/L (ref 39–117)
Bilirubin, Direct: 0.1 mg/dL (ref 0.0–0.3)
Total Bilirubin: 0.8 mg/dL (ref 0.2–1.2)
Total Protein: 7.3 g/dL (ref 6.0–8.3)

## 2019-03-03 LAB — TSH: TSH: 1.75 u[IU]/mL (ref 0.35–4.50)

## 2019-03-03 LAB — LIPID PANEL
Cholesterol: 222 mg/dL — ABNORMAL HIGH (ref 0–200)
HDL: 53 mg/dL (ref >39.00)
LDL Cholesterol: 140 mg/dL — ABNORMAL HIGH (ref 0–99)
NonHDL: 168.52
Total CHOL/HDL Ratio: 4
Triglycerides: 141 mg/dL (ref 0.0–149.0)
VLDL: 28.2 mg/dL (ref 0.0–40.0)

## 2019-03-03 LAB — T4, FREE: Free T4: 0.84 ng/dL (ref 0.60–1.60)

## 2019-03-03 LAB — HEMOGLOBIN A1C: Hgb A1c MFr Bld: 6.1 % (ref 4.6–6.5)

## 2019-03-03 NOTE — Progress Notes (Signed)
Chief Complaint  Patient presents with  . Annual Exam    Pt only has concerns about hearing wants referral     HPI: [name] [name] 85 y.o. comes in today for yearly medicare visit and med management  HT  Taking  meds seems to be no problem   No syncope  Hearing getting worse and realized eventually  could benefit from hearing assistance Sugare still  Avoiding sweets and sugars . No more hair changes  Said that thyroid was ok per endo and dermatology   .   Health Maintenance  Topic Date Due  . DEXA SCAN  09/01/2019 (Originally 09/27/1998)  . INFLUENZA VACCINE  03/04/2019  . TETANUS/TDAP  08/03/2027  . 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   Safety:  Has smoke detector and wears seat belts.  No firearms. No excess sun exposure. Sees dentist regularly.  Falls:n  Memory: Felt to be good  , no concern from her or her family.  Depression: No anhedonia unusual crying or depressive symptoms  Nutrition: Eats well balanced diet; adequate calcium and vitamin D. No swallowing chewing problems.  Injury: no major injuries in the last six months.  Other healthcare providers:  Reviewed today .  Preventive parameters:   Reviewed   ADLS:   There are no problems or need for assistance  driving, feeding, obtaining food, dressing, toileting and bathing, managing money using phone. She is independent.     ROS:  GEN/ HEENT: No fever, significant weight changes sweats headaches vision problems to get eyes checked  CV/ PULM; No chest pain shortness of breath cough, syncope,edema  change in exercise tolerance. GI /GU: No adominal pain, vomiting, change in  bowel habits. No blood in the stool. No significant GU symptoms. SKIN/HEME: ,no acute skin rashes suspicious lesions or bleeding. No lymphadenopathy, nodules, masses.  NEURO/ PSYCH:  No neurologic signs such as weakness numbness. No depression anxiety. IMM/ Allergy: No unusual infections.  Allergy .   REST of 12 system review negative except as per HPI   Past Medical History:  Diagnosis Date  . Adenomatous colon polyp     4 2008  . Breast cancer ([place]) 1978   left breast  . Complication of anesthesia   . Cystitis, interstitial   . Fracture    left ankle and rt wrist  . FRACTURE, ANKLE, LEFT 12/14/2006   Qualifier: Diagnosis of  By: Hulan Saas, CMA (AAMA), [place]   . FRACTURE, WRIST 12/14/2006   Qualifier: Diagnosis of  By: Hulan Saas, CMA (AAMA), [place]e   . Hyperlipidemia   . Hypertension   . Osteoarthritis   . PONV (postoperative nausea and vomiting)    Episode after Ether  . Rotator cuff tear, right   . Shingles   . Urinary incontinence     Family History  Problem Relation Age of Onset  . Suicidality Mother   . Heart attack Father   . Cancer - Other Daughter        throat cancer   . Thyroid disease Neg Hx     Social  History   Socioeconomic History  . Marital status: Married    Spouse name: Not on file  . Number of children: 3  . Years of education: Not on file  . 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 Financial resource strain: Not hard at all  . Food insecurity    Worry: Never true    Inability: Never true  . Transportation needs    Medical: No    Non-medical: No  Tobacco Use  . Smoking status: Former Research scientist (life sciences)  . Smokeless tobacco: Never Used  . 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 Alcohol use: Yes    Comment: none at this time   . Drug use: No  . Sexual activity: Not Currently  Lifestyle  . Physical activity     Days per week: 0 days    Minutes per session: 0 min  . Stress: Rather much  Relationships  . Social Herbalist on phone: Twice a week    Gets together: Once a week    Attends religious service: More than 4 times per year    Active member of club or organization: No    Attends meetings of clubs or organizations: Never    Relationship status: Married  Other Topics Concern  . Not on file  Social History Narrative   Retired   Married   [place] of 2   Husband with medical problems   g5 p3   Ex smoker  Quit 33 years ago  1952- 1968      08/31/2018: Has 3 children, one local son who can help occasionally if needed   Struggling with taking care of husband at home who is partially blind, but pt. not interested in caregiver or respite support   Enjoys reading, walking around the house.    Outpatient Encounter Medications as of 03/03/2019  Medication Sig  . amLODipine (NORVASC) 5 MG tablet Take 1 tablet by mouth once daily  . Ascorbic Acid (VITAMIN C) 500 MG tablet Take 500 mg by mouth 2 (two) times daily.   Marland Kitchen doxylamine, Sleep, (UNISOM) 25 MG tablet Take 25 mg by mouth at bedtime as needed for sleep.  Marland Kitchen loratadine (CLARITIN) 10 MG tablet Take 10 mg by mouth daily as needed for allergies.  . Multiple Vitamins-Minerals (CENTRUM SILVER PO) Take 1 tablet by mouth daily.   . Calcium Carb-Cholecalciferol (CALCIUM 600+D3) 600-800 MG-UNIT TABS Take 1 tablet by mouth daily.  Marland Kitchen CINNAMON PO Take 1,000 mg by mouth 2 (two) times daily.   . fish oil-omega-3 fatty acids 1000 MG capsule Take 1 g by mouth 2 (two) times daily.   . Magnesium 400 MG TABS Take 400 mg by mouth daily.   . Vitamin D, Cholecalciferol, 1000 UNITS TABS Take 1,000 Units by mouth daily.    No facility-administered encounter medications on file as of 03/03/2019.     EXAM:  BP 116/62 (BP Location: Right Arm, Patient Position: Sitting, Cuff Size: Normal)   Pulse 66   Temp 98.7 F (37.1 C) (Oral)   Ht 5' 2.5" (1.588 m)   Wt  121 lb 12.8 oz (55.2 kg)   SpO2 95%   BMI 21.92 kg/m   Body mass index is 21.92 kg/m.  Physical Exam: Vital signs reviewed EHM:CNOB is a well-developed well-nourished alert cooperative  who appears stated age  And younger in no acute distress.  HEENT: normocephalic atraumatic , Eyes: PERRL EOM's full, conjunctiva clear, Nares: paten,t no deformity discharge or tenderness., Ears: no deformity EAC's clear TMs with normal landmarks. Mouth:masked  NECK: supple without masses, thyromegaly or bruits. CHEST/PULM:  Clear to auscultation and percussion breath sounds equal no wheeze , rales or rhonchi. No chest wall deformities or tenderness. CV: PMI is nondisplaced, S1 S2 no gallops, murmurs, rubs. Peripheral pulses are full without delay.No JVD . Chest left breast absent right no nodules   ABDOMEN: Bowel sounds normal nontender  No guard or rebound, no hepato splenomegal    Extremtities:  No clubbing cyanosis or edema, no acute joint swelling or redness no focal atrophy NEURO:  Oriented x3, cranial nerves 3-12 appear to be intact, no obvious focal weakness,gait within normal limits ( co stiff back)  SKIN: No acute rashes normal turgor, color, no bruising or petechiae. PSYCH: Oriented, good eye contact, no obvious depression anxiety, cognition and judgment appear normal. LN: no cervical axillary inguinal adenopathy No noted deficits in memory, attention, and speech.   Lab Results  Component Value Date   WBC 5.5 03/03/2019   HGB 14.9 03/03/2019   HCT 44.3 03/03/2019   PLT 170.0 03/03/2019   GLUCOSE 116 (H) 03/03/2019   CHOL 222 (H) 03/03/2019   TRIG 141.0 03/03/2019   HDL 53.00 03/03/2019   LDLDIRECT 165.0 12/07/2006   LDLCALC 140 (H) 03/03/2019   ALT 19 03/03/2019   AST 18 03/03/2019   NA 143 03/03/2019   K 4.6 03/03/2019   CL 108 03/03/2019   CREATININE 0.95 03/03/2019   BUN 23 03/03/2019   CO2 27 03/03/2019   TSH 1.75 03/03/2019   HGBA1C 6.1 03/03/2019   Wt Readings from Last  3 Encounters:  03/03/19 121 lb 12.8 oz (55.2 kg)  08/31/18 126 lb (57.2 kg)  05/13/18 122 lb (55.3 kg)    ASSESSMENT AND PLAN:  Discussed the following assessment and plan:   ICD-10-CM   1. Essential hypertension  Z02 Basic metabolic panel    CBC with Differential/Platelet    Hemoglobin A1c    Hepatic function panel    Lipid panel    TSH    T4, free  2. Decreased hearing of both ears  H85.27 Basic metabolic panel    CBC with Differential/Platelet    Hemoglobin A1c    Hepatic function panel    Lipid panel    TSH    T4, free  3. Fasting hyperglycemia  P82.42 Basic metabolic panel    CBC with Differential/Platelet    Hemoglobin A1c    Hepatic function panel    Lipid panel    TSH    T4, free  4. Hyperlipidemia, unspecified hyperlipidemia type  P53.6 Basic metabolic panel    CBC with Differential/Platelet    Hemoglobin A1c    Hepatic function panel    Lipid panel    TSH    T4, free  5. Elevated serum free T4 level  R44.31 Basic metabolic panel    CBC with Differential/Platelet    Hemoglobin A1c    Hepatic function panel    Lipid panel    TSH    T4, free  6. Medication management  V40.086 Basic metabolic panel    CBC with Differential/Platelet    Hemoglobin A1c    Hepatic function panel    Lipid panel    TSH    T4, free  7. BREAST CANCER, HX  OF  Z85.3   stable  X hearing decreased avoid further weight loss  See previous discussions is care taking her husband mostly  Patient Care Team: [name], Standley Brooking, MD as PCP - General Franchot Gallo, MD (Urology)  Patient Instructions  Stay hydrated   And  Go slow to avoid falling   Will notify you  of labs when available.   I agree get evaluated  For hearing.  We can do an audiology referral   Or you can check with  below [facility]  ( [facility])  [phone] [address], [place] [address]    Presbycusis  Presbycusis is age-related hearing loss. This is a  common condition that begins in middle age and develops slowly over time. Presbycusis usually affects both ears. This condition is permanent and cannot be corrected with surgery. However, hearing aids can help with this condition. What are the causes? This condition is caused by age-related changes in the inner or middle ear. These changes usually affect parts of the spiral-shaped cavity that translates sounds into nerve signals that are sent to the brain (cochlea). What increases the risk? The following factors may make you more likely to develop this condition:  Older age.  Being female.  Exposure to loud noises.  Exposure to medicines or substances that are poisonous (toxic) to organs in the ear. For example, chemotherapy drugs can sometimes affect hearing.  Having certain medical conditions, such as: ? High blood pressure (hypertension). ? Diabetes. ? Diseases that affect the body's disease-fighting system (immune diseases). ? Heart diseases. ? High cholesterol.  Smoking.  Having a history of ear infections.  Having a family history of age-related hearing loss. What are the signs or symptoms? The main symptom of this condition is hearing loss that develops slowly with age. Generally, it affects both ears. Signs of hearing loss can include:  Asking people to repeat themselves often.  Difficulty hearing people over the phone.  Difficulty following conversations when multiple people are talking.  Difficulty hearing when there is background noise, such as at a restaurant or bar.  Difficulty hearing high-pitched sounds, such as children speaking.  Turning the television volume up to levels that are higher than normal.  Talking loudly. Other symptoms include:  Roaring, ringing, hissing, or buzzing in the ears (tinnitus). How is this diagnosed? This condition may be diagnosed based on:  Your medical history.  An ear exam. Your health care provider will look into your ears  to check whether there may be another cause of your hearing loss, such as earwax buildup.  A hearing test (audiogram). This test will see whether you can hear different tones at different pitches. Your health care provider may also refer you to an ear, nose, and throat specialist (otolaryngologist) or a hearing loss specialist (audiologist) who can assess your hearing loss and identify the cause. How is this treated? There is no cure for this condition, but treatment can help you hear better. Treatment may include:  Hearing aids.  A hearing device that is placed in the cochlea (cochlear implant).  Other devices to help you hear (assistive listening devices), such as electronic devices or apps.  Strategies to help with hearing in daily life (auditory rehabilitation).  Lip reading training. Follow these instructions at home: Communicating with others  Tell your family and friends about your hearing loss. Once they know, they will be able to communicate better with you and help  you cope.  When talking with others, face them so you can see their lips move while they talk. Watch their hand gestures and facial expressions to help you interpret what they say. Ask people to: ? Get your attention before they start talking to you. ? Speak more clearly, slowly, or loudly (but not shout). ? Face you when they are talking. Coping with hearing loss  Consider using assistive listening devices at home and at work, such as: ? Electronic devices that are linked with hearing aids to help you hear on the telephone. These can also be hooked up to sound systems in certain places such as theaters. ? Apps for smartphones and tablets that can help translate speech to text. ? Alerting devices. These play a loud sound or flash a light to indicate a doorbell or alarm.  Avoid loud noises.  Avoid or decrease background noise as much as possible. ? Turn off the TV and radio when you are not actively listening to  it. ? Avoid noisy spots in public places. For example, when in a restaurant, ask for a table away from the kitchen or bar.  Consider adding carpet to your home to absorb excess noise.  Consider adding extra lighting to your home to help you see people when they are talking to you. General instructions  If you need a hearing aid, have it fitted by a specialist. Do not buy hearing aids unless you have had a hearing aid evaluation.  Take over-the-counter and prescription medicines only as told by your health care provider.  Return to your normal activities as told by your health care provider. Ask your health care provider what activities are safe for you.  Keep all follow-up visits as told by your health care provider. This is important. Contact a health care provider if:  Your hearing gets worse, even with treatment.  You feel isolated or become depressed.  You have a fever.  You have new ringing or buzzing in your ear. Get help right away if:  You lose hearing suddenly (over a few hours or a day).  You have numbness, especially in parts of your face.  You have ear or head pain.  You are confused.  You are bleeding from your ear.  You experience dizziness on a regular basis. Summary  Presbycusis is age-related hearing loss that begins in middle age and develops slowly over time.  This condition is caused by age-related changes in the inner or middle ear.  You are more likely to develop this condition if you are older, female, take certain medicines, are exposed to loud noises, have certain health conditions, or have a family history of hearing loss.  Presbycusis usually affects both ears.  Treatment may include hearing aids, a hearing device that is placed in a cavity in the inner ear (cochlear implant), lifestyle changes, lip reading, and assistive listening devices. This information is not intended to replace advice given to you by your health care provider. Make sure  you discuss any questions you have with your health care provider. Document Released: 07/17/2000 Document Revised: 07/02/2017 Document Reviewed: 08/28/2016 Elsevier Patient Education  2020 [place] [name] M.D.

## 2019-03-03 NOTE — Patient Instructions (Addendum)
Stay hydrated   And  Go slow to avoid falling   Will notify you  of labs when available.   I agree get evaluated  For hearing.  We can do an audiology referral   Or you can check with  below [facility]  ( [facility])  [phone] [address] [place], [place] [address]    Presbycusis  Presbycusis is age-related hearing loss. This is a common condition that begins in middle age and develops slowly over time. Presbycusis usually affects both ears. This condition is permanent and cannot be corrected with surgery. However, hearing aids can help with this condition. What are the causes? This condition is caused by age-related changes in the inner or middle ear. These changes usually affect parts of the spiral-shaped cavity that translates sounds into nerve signals that are sent to the brain (cochlea). What increases the risk? The following factors may make you more likely to develop this condition:  Older age.  Being female.  Exposure to loud noises.  Exposure to medicines or substances that are poisonous (toxic) to organs in the ear. For example, chemotherapy drugs can sometimes affect hearing.  Having certain medical conditions, such as: ? High blood pressure (hypertension). ? Diabetes. ? Diseases that affect the body's disease-fighting system (immune diseases). ? Heart diseases. ? High cholesterol.  Smoking.  Having a history of ear infections.  Having a family history of age-related hearing loss. What are the signs or symptoms? The main symptom of this condition is hearing loss that develops slowly with age. Generally, it affects both ears. Signs of hearing loss can include:  Asking people to repeat themselves often.  Difficulty hearing people over the phone.  Difficulty following conversations when multiple people are talking.  Difficulty hearing when there is background noise, such as at a restaurant or bar.  Difficulty hearing  high-pitched sounds, such as children speaking.  Turning the television volume up to levels that are higher than normal.  Talking loudly. Other symptoms include:  Roaring, ringing, hissing, or buzzing in the ears (tinnitus). How is this diagnosed? This condition may be diagnosed based on:  Your medical history.  An ear exam. Your health care provider will look into your ears to check whether there may be another cause of your hearing loss, such as earwax buildup.  A hearing test (audiogram). This test will see whether you can hear different tones at different pitches. Your health care provider may also refer you to an ear, nose, and throat specialist (otolaryngologist) or a hearing loss specialist (audiologist) who can assess your hearing loss and identify the cause. How is this treated? There is no cure for this condition, but treatment can help you hear better. Treatment may include:  Hearing aids.  A hearing device that is placed in the cochlea (cochlear implant).  Other devices to help you hear (assistive listening devices), such as electronic devices or apps.  Strategies to help with hearing in daily life (auditory rehabilitation).  Lip reading training. Follow these instructions at home: Communicating with others  Tell your family and friends about your hearing loss. Once they know, they will be able to communicate better with you and help you cope.  When talking with others, face them so you can see their lips move while they talk. Watch their hand gestures and facial expressions to help you interpret what they say. Ask people to: ? Get your attention before they start talking to  you. ? Speak more clearly, slowly, or loudly (but not shout). ? Face you when they are talking. Coping with hearing loss  Consider using assistive listening devices at home and at work, such as: ? Electronic devices that are linked with hearing aids to help you hear on the telephone. These can  also be hooked up to sound systems in certain places such as theaters. ? Apps for smartphones and tablets that can help translate speech to text. ? Alerting devices. These play a loud sound or flash a light to indicate a doorbell or alarm.  Avoid loud noises.  Avoid or decrease background noise as much as possible. ? Turn off the TV and radio when you are not actively listening to it. ? Avoid noisy spots in public places. For example, when in a restaurant, ask for a table away from the kitchen or bar.  Consider adding carpet to your home to absorb excess noise.  Consider adding extra lighting to your home to help you see people when they are talking to you. General instructions  If you need a hearing aid, have it fitted by a specialist. Do not buy hearing aids unless you have had a hearing aid evaluation.  Take over-the-counter and prescription medicines only as told by your health care provider.  Return to your normal activities as told by your health care provider. Ask your health care provider what activities are safe for you.  Keep all follow-up visits as told by your health care provider. This is important. Contact a health care provider if:  Your hearing gets worse, even with treatment.  You feel isolated or become depressed.  You have a fever.  You have new ringing or buzzing in your ear. Get help right away if:  You lose hearing suddenly (over a few hours or a day).  You have numbness, especially in parts of your face.  You have ear or head pain.  You are confused.  You are bleeding from your ear.  You experience dizziness on a regular basis. Summary  Presbycusis is age-related hearing loss that begins in middle age and develops slowly over time.  This condition is caused by age-related changes in the inner or middle ear.  You are more likely to develop this condition if you are older, female, take certain medicines, are exposed to loud noises, have certain  health conditions, or have a family history of hearing loss.  Presbycusis usually affects both ears.  Treatment may include hearing aids, a hearing device that is placed in a cavity in the inner ear (cochlear implant), lifestyle changes, lip reading, and assistive listening devices. This information is not intended to replace advice given to you by your health care provider. Make sure you discuss any questions you have with your health care provider. Document Released: 07/17/2000 Document Revised: 07/02/2017 Document Reviewed: 08/28/2016 Elsevier Patient Education  2020 [employer].

## 2019-03-08 ENCOUNTER — Telehealth: Payer: Self-pay | Admitting: Internal Medicine

## 2019-03-08 NOTE — Telephone Encounter (Signed)
Called to let patient know she would need [employer] to send [place] a form that it wouldn't be an rx

## 2019-03-08 NOTE — Telephone Encounter (Signed)
Pt is calling an needs 3  mastectomy bras and prothesis. Pt will be going to [facility]. Pt would like rx mail to home address

## 2019-03-08 NOTE — Telephone Encounter (Signed)
See request °

## 2019-03-10 DIAGNOSIS — H2513 Age-related nuclear cataract, bilateral: Secondary | ICD-10-CM | POA: Diagnosis not present

## 2019-04-24 ENCOUNTER — Other Ambulatory Visit: Payer: Self-pay | Admitting: Internal Medicine

## 2019-04-26 ENCOUNTER — Ambulatory Visit: Payer: Medicare Other

## 2019-04-28 ENCOUNTER — Ambulatory Visit: Payer: Medicare Other

## 2019-05-03 ENCOUNTER — Telehealth: Payer: Self-pay | Admitting: Internal Medicine

## 2019-05-03 ENCOUNTER — Ambulatory Visit (INDEPENDENT_AMBULATORY_CARE_PROVIDER_SITE_OTHER): Payer: Medicare Other | Admitting: *Deleted

## 2019-05-03 ENCOUNTER — Other Ambulatory Visit: Payer: Self-pay

## 2019-05-03 DIAGNOSIS — Z23 Encounter for immunization: Secondary | ICD-10-CM | POA: Diagnosis not present

## 2019-05-03 NOTE — Telephone Encounter (Signed)
Please advise 

## 2019-05-03 NOTE — Telephone Encounter (Signed)
Prescription is up front readyto be picked up  Pt husband was notified

## 2019-05-03 NOTE — Telephone Encounter (Signed)
Please get her rx for this  Dx  History of breast cancer and  S/p mastectomy

## 2019-05-03 NOTE — Telephone Encounter (Signed)
Please write out prescription on prescription pad

## 2019-05-03 NOTE — Telephone Encounter (Signed)
Patient is needing a prescription for a mastectomy bra.

## 2019-05-03 NOTE — Progress Notes (Signed)
Per orders of Dr. Regis Bill, injection of Influenza Vaccine given by [facility]ac[facility] Pontes. Patient tolerated injection well.

## 2019-07-24 ENCOUNTER — Other Ambulatory Visit: Payer: Self-pay | Admitting: Internal Medicine

## 2019-08-25 ENCOUNTER — Other Ambulatory Visit: Payer: Self-pay

## 2019-08-29 ENCOUNTER — Other Ambulatory Visit: Payer: Self-pay

## 2019-08-29 ENCOUNTER — Encounter: Payer: Self-pay | Admitting: Internal Medicine

## 2019-08-29 ENCOUNTER — Ambulatory Visit (INDEPENDENT_AMBULATORY_CARE_PROVIDER_SITE_OTHER): Payer: Medicare Other | Admitting: Internal Medicine

## 2019-08-29 VITALS — BP 110/64 | HR 75 | Temp 98.4°F | Wt 125.2 lb

## 2019-08-29 DIAGNOSIS — Z853 Personal history of malignant neoplasm of breast: Secondary | ICD-10-CM

## 2019-08-29 DIAGNOSIS — Z79899 Other long term (current) drug therapy: Secondary | ICD-10-CM

## 2019-08-29 DIAGNOSIS — I10 Essential (primary) hypertension: Secondary | ICD-10-CM

## 2019-08-29 DIAGNOSIS — E785 Hyperlipidemia, unspecified: Secondary | ICD-10-CM

## 2019-08-29 DIAGNOSIS — R7301 Impaired fasting glucose: Secondary | ICD-10-CM | POA: Diagnosis not present

## 2019-08-29 LAB — POCT GLYCOSYLATED HEMOGLOBIN (HGB A1C): Hemoglobin A1C: 5.7 % — AB (ref 4.0–5.6)

## 2019-08-29 NOTE — Progress Notes (Signed)
Chief Complaint  Patient presents with  . Hyperglycemia  . Medication Management    HPI: [name] [name] 86 y.o. come in for Chronic disease management   6 mos   BG trying.  To eat healthy  BP on amlodipine  hld  lsi Hx breast cancer   Still needs prosthesis  2  Not had time to get to  Place   Doesn't"t  use on line   Waited on phone too long to get covid vaccine appt   But son may have signed her up.  Urinary  Use azo as needed hx of  Meatal stenosis?   Stretching per dr Rosana Hoes  But not recently  Still active    ROS: See pertinent positives and negatives per HPI. ocass  Delayed name recall  ( remote  Names etc )   Past Medical History:  Diagnosis Date  . Adenomatous colon polyp     4 2008  . Breast cancer ([place]) 1978   left breast  . Complication of anesthesia   . Cystitis, interstitial   . Fracture    left ankle and rt wrist  . FRACTURE, ANKLE, LEFT 12/14/2006   Qualifier: Diagnosis of  By: Hulan Saas, CMA (AAMA), Quita Skye   . FRACTURE, WRIST 12/14/2006   Qualifier: Diagnosis of  By: Hulan Saas, CMA (AAMA), Quita Skye   . Hyperlipidemia   . Hypertension   . Osteoarthritis   . PONV (postoperative nausea and vomiting)    Episode after Ether  . Rotator cuff tear, right   . Shingles   . Urinary incontinence     Family History  Problem Relation Age of Onset  . Suicidality Mother   . Heart attack Father   . Cancer - Other Daughter        throat cancer   . Thyroid disease Neg Hx       Outpatient Medications Prior to Visit  Medication Sig Dispense Refill  . amLODipine (NORVASC) 5 MG tablet Take 1 tablet by mouth once daily 90 tablet 0  . Ascorbic Acid (VITAMIN C) 500 MG tablet Take 500 mg by mouth 2 (two) times daily.     . Calcium Carb-Cholecalciferol (CALCIUM 600+D3) 600-800 MG-UNIT TABS Take 1 tablet by mouth daily.    Marland Kitchen CINNAMON PO Take 1,000 mg by mouth 2 (two) times daily.     Marland Kitchen doxylamine, Sleep, (UNISOM) 25 MG tablet Take 25 mg by mouth at bedtime as needed  for sleep.    . fish oil-omega-3 fatty acids 1000 MG capsule Take 1 g by mouth 2 (two) times daily.     Marland Kitchen loratadine (CLARITIN) 10 MG tablet Take 10 mg by mouth daily as needed for allergies.    . Magnesium 400 MG TABS Take 400 mg by mouth daily.     . Multiple Vitamins-Minerals (CENTRUM SILVER PO) Take 1 tablet by mouth daily.     . Vitamin D, Cholecalciferol, 1000 UNITS TABS Take 1,000 Units by mouth daily.      No facility-administered medications prior to visit.     EXAM:  BP 110/64 (BP Location: Right Arm, Patient Position: Sitting, Cuff Size: Normal)   Pulse 75   Temp 98.4 F (36.9 C) (Temporal)   Wt 125 lb 3.2 oz (56.8 kg)   SpO2 96%   BMI 22.53 kg/m   Body mass index is 22.53 kg/m.  GENERAL: vitals reviewed and listed above, alert, oriented, appears well hydrated and in no acute distress HEENT: atraumatic, conjunctiva  clear,  no obvious abnormalities on inspection of external nose and ears OP : masked  NECK: no obvious masses on inspection palpation  LUNGS: clear to auscultation bilaterally, no wheezes, rales or rhonchi, good air movement CV: HRRR, no clubbing cyanosis or  peripheral edema nl cap refill  Abdomen:  Sof,t normal bowel sounds without hepatosplenomegaly, no guarding rebound or masses no CVA tenderness MS: moves all extremities without noticeable focal  Abnormality OA changes hands but nl rom function  PSYCH: pleasant and cooperative, no obvious depression or anxiety Lab Results  Component Value Date   WBC 5.5 03/03/2019   HGB 14.9 03/03/2019   HCT 44.3 03/03/2019   PLT 170.0 03/03/2019   GLUCOSE 116 (H) 03/03/2019   CHOL 222 (H) 03/03/2019   TRIG 141.0 03/03/2019   HDL 53.00 03/03/2019   LDLDIRECT 165.0 12/07/2006   LDLCALC 140 (H) 03/03/2019   ALT 19 03/03/2019   AST 18 03/03/2019   NA 143 03/03/2019   K 4.6 03/03/2019   CL 108 03/03/2019   CREATININE 0.95 03/03/2019   BUN 23 03/03/2019   CO2 27 03/03/2019   TSH 1.75 03/03/2019   HGBA1C 5.7  (A) 08/29/2019   BP Readings from Last 3 Encounters:  08/29/19 110/64  03/03/19 116/62  08/31/18 118/70    ASSESSMENT AND PLAN:  Discussed the following assessment and plan:  Fasting hyperglycemia - a1c improved continue   - Plan: POCT glycosylated hemoglobin (Hb A1C)  Essential hypertension - controlled   Hyperlipidemia, unspecified hyperlipidemia type  Medication management  History of breast cancer - mastectomy  needs prosthesis renewal  updated for 2 6 mos  cpx lab as indicated Visit  Has declined  dexa and meds in past  -Patient advised to return or notify health care team  if  new concerns arise.  Patient Instructions  Your sugar is better .  a1c is 5.7 .  Blood pressure is good also  Continue lifestyle intervention healthy eating and exercise .   Get the covid vaccine when available   You may be on the waiting list   Ask your son   Or other.  Can call again.   To get on waiting list .   Plan   CPX in  6 months  Or as needed.     Standley Brooking. Jaiveer Panas M.D. Outside external source  DATA REVIEWED: ehr  Prev endo uro evals  And eye cataract   Total time on date  of service including record review ordering and plan of care: counseling about vaccine etc  30 minutes

## 2019-08-29 NOTE — Patient Instructions (Addendum)
Your sugar is better .  a1c is 5.7 .  Blood pressure is good also  Continue lifestyle intervention healthy eating and exercise .   Get the covid vaccine when available   You may be on the waiting list   Ask your son   Or other.  Can call again.   To get on waiting list .   Plan   CPX in  6 months  Or as needed.

## 2019-09-19 ENCOUNTER — Ambulatory Visit (INDEPENDENT_AMBULATORY_CARE_PROVIDER_SITE_OTHER): Payer: Medicare Other

## 2019-09-19 VITALS — Ht 63.0 in | Wt 125.0 lb

## 2019-09-19 DIAGNOSIS — Z0000 Encounter for general adult medical examination without abnormal findings: Secondary | ICD-10-CM

## 2019-09-19 NOTE — Progress Notes (Signed)
This visit is being conducted via phone call due to the COVID-19 pandemic. This patient has given me verbal consent via phone to conduct this visit, patient states they are participating from their home address. Some vital signs may be absent or patient reported.   Patient identification: identified by name, DOB, and current address.  Location provider:  [facility], Office Persons participating in the virtual visit: Mrs. [name] [name] and [name] Forts, LPN.    Subjective:   [name] [name] is a 86 y.o. female who presents for Medicare Annual (Subsequent) preventive examination.  [name] [name] was quite upset today with her neighbor who made a mess in her yard with a tree removal. She states she has stayed very busy taking care of her home and taking care of her husband. She tries to eat healthy, balanced meals.  Review of Systems:  No ROS: Annual Medicare Wellness Visit today Cardiac Risk Factors include: advanced age (>56men, >6 women);sedentary lifestyle;dyslipidemia;hypertension     Objective:     Vitals: Ht 5\' 3"  (1.6 m)   Wt 125 lb (56.7 kg)   BMI 22.14 kg/m   Body mass index is 22.14 kg/m.  Advanced Directives 09/19/2019 08/31/2018 12/17/2017 07/20/2017 11/05/2014  Does Patient Have a Medical Advance Directive? Yes Yes Yes Yes Yes  Type of Advance Directive - Living will;Healthcare Power of Attorney Out of facility DNR (pink MOST or yellow form) - -  Does patient want to make changes to medical advance directive? No - Patient declined No - Patient declined No - Patient declined - -  Copy of [place] in Chart? 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  Past Medical History:  Diagnosis Date  . Adenomatous colon polyp     4 2008  . Breast cancer ([place]) 1978   left breast  . Complication of anesthesia   . Cystitis, interstitial   . Fracture    left ankle and rt wrist  . FRACTURE, ANKLE, LEFT 12/14/2006   Qualifier: Diagnosis of  By: Hulan Saas, CMA (AAMA), Quita Skye   . FRACTURE, WRIST 12/14/2006   Qualifier: Diagnosis of  By: Hulan Saas, CMA (AAMA), Quita Skye   . Hyperlipidemia   . Hypertension   . Osteoarthritis   . PONV (postoperative nausea and vomiting)    Episode after Ether  . Rotator cuff tear, right   . Shingles   . Urinary incontinence    Past Surgical History:  Procedure Laterality Date  . ABDOMINAL HYSTERECTOMY  1988   fibroids  . DILATION AND CURETTAGE OF UTERUS     x 2  . MASTECTOMY, PARTIAL  1978   left  . SHOULDER ARTHROSCOPY WITH DISTAL CLAVICLE RESECTION Right 12/23/2017   Procedure:  SHOULDER ARTHROSCOPY WITH DISTAL CLAVICLE RESECTION;  Surgeon: Justice Britain, MD;  Location: [facility];  Service: Orthopedics;  Laterality: Right;  . SHOULDER ARTHROSCOPY WITH ROTATOR CUFF REPAIR AND SUBACROMIAL DECOMPRESSION Right 12/23/2017   Procedure: SHOULDER ARTHROSCOPY WITH ROTATOR CUFF REPAIR AND SUBACROMIAL DECOMPRESSION, distal clavicle resection;  Surgeon: Justice Britain, MD;  Location: [facility];  Service: Orthopedics;  Laterality: Right;  . TONSILLECTOMY    . TUBAL LIGATION  77   Family History  Problem Relation Age of Onset  . Suicidality Mother   . Heart attack Father   . Cancer - Other Daughter        throat cancer   . Thyroid disease  Neg Hx    Social History   Socioeconomic History  . Marital status: Married    Spouse name: Not on file  . Number of children: 3  . Years of education: Not on file  . 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 Smoking status: Former Research scientist (life sciences)  . Smokeless tobacco: Never Used  . 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 Alcohol use: Not Currently    Comment: none at this time   . Drug use: No  . Sexual activity: Not Currently  Other Topics Concern  . Not on file  Social History Narrative   Retired   Married   [place] of 2   Husband with medical problems   g5 p3   Ex smoker  Quit 33 years ago  1952- 1968      09/19/2019: Has 3 children, one local son who can help occasionally if needed   Struggling with taking care of husband at home who is partially blind, but pt. not interested in caregiver or respite support   Enjoys reading, walking around the house.   Social Determinants of Health   Financial Resource Strain: Low Risk   . Difficulty of Paying Living Expenses: Not hard at all  Food Insecurity: No Food Insecurity  . Worried About Charity fundraiser in the Last Year: Never true  . Ran Out of Food in the Last Year: Never true  Transportation Needs: No Transportation Needs  . Lack of Transportation (Medical): No  . Lack of Transportation (Non-Medical): No  Physical Activity: Sufficiently Active  . Days of Exercise per Week: 5 days  . Minutes of Exercise per Session: 30 min  Stress: Stress Concern Present  . Feeling of Stress : Very much  Social Connections: Unknown  . Frequency of Communication with Friends and Family: More than three times a week  . Frequency of Social Gatherings with Friends and Family: Once a week  . Attends Religious Services: Not on file  . Active Member of Clubs or Organizations: Not on file  . Attends Archivist  Meetings: Not on file  . Marital Status: Married    Outpatient Encounter Medications as of 09/19/2019  Medication Sig  . amLODipine (NORVASC) 5 MG tablet Take 1 tablet by mouth once daily  . Ascorbic Acid (VITAMIN C) 500 MG tablet Take 500 mg by mouth 2 (two) times daily.   . Calcium Carb-Cholecalciferol (CALCIUM 600+D3) 600-800 MG-UNIT TABS Take 1 tablet by mouth daily.  Marland Kitchen CINNAMON PO Take 1,000 mg by mouth 2 (two) times daily.   Marland Kitchen doxylamine, Sleep, (UNISOM) 25 MG tablet Take 25 mg by mouth at  bedtime as needed for sleep.  . fish oil-omega-3 fatty acids 1000 MG capsule Take 1 g by mouth 2 (two) times daily.   Marland Kitchen loratadine (CLARITIN) 10 MG tablet Take 10 mg by mouth daily as needed for allergies.  . Magnesium 400 MG TABS Take 400 mg by mouth daily.   . Multiple Vitamins-Minerals (CENTRUM SILVER PO) Take 1 tablet by mouth daily.   . Vitamin D, Cholecalciferol, 1000 UNITS TABS Take 1,000 Units by mouth daily.    No facility-administered encounter medications on file as of 09/19/2019.    Activities of Daily Living In your present state of health, do you have any difficulty performing the following activities: 09/19/2019  Hearing? Y  Vision? N  Difficulty concentrating or making decisions? N  Walking or climbing stairs? N  Dressing or bathing? N  Doing errands, shopping? N  Preparing Food and eating ? N  Using the Toilet? N  In the past six months, have you accidently leaked urine? Y  Do you have problems with loss of bowel control? N  Managing your Medications? N  Managing your Finances? N  Housekeeping or managing your Housekeeping? N  Some recent data might be hidden    Patient Care Team: [name], Standley Brooking, MD as PCP - General Franchot Gallo, MD (Urology)    Assessment:   This is a routine wellness examination for [place].  Exercise Activities and Dietary recommendations Current Exercise Habits: The patient does not participate in regular exercise at present, Exercise  limited by: None identified  Goals    . Patient Stated     Will fup on getting a hearing test     . Patient Stated     Keep watching sugar in diet and follow up with hearing aide/audiology resources       Fall Risk Fall Risk  09/19/2019 03/03/2019 08/31/2018 02/23/2018 07/20/2017  Falls in the past year? 0 1 1 Yes No  Number falls in past yr: - 0 1 1 -  Injury with Fall? - 1 - Yes -  Comment - - - pt had shoulder surgery as result -  Risk for fall due to : Medication side effect - Impaired balance/gait;History of fall(s);Impaired vision - -  Follow up Falls evaluation completed;Education provided;Falls prevention discussed - Falls prevention discussed - -   Is the patient's home free of loose throw rugs in walkways, pet beds, electrical cords, etc?   yes      Grab bars in the bathroom? yes      Handrails on the stairs?   yes      Adequate lighting?   yes  Timed Get Up and Go performed: N/A due to telephone visit  Depression Screen PHQ 2/9 Scores 09/19/2019 03/03/2019 08/31/2018 02/23/2018  PHQ - 2 Score 0 0 1 0  PHQ- 9 Score - - 4 -     Cognitive Function MMSE - Mini Mental State Exam 07/20/2017  Not completed: (No Data)     6CIT Screen 09/19/2019  What Year? 0 points  What month? 0 points  What time? 0 points  Count back from 20 0 points  Months in reverse 0 points  Repeat phrase 0 points  Total Score 0    Immunization History  Administered Date(s) Administered  . Fluad Quad(high Dose 65+) 05/03/2019  . Influenza Split 04/21/2011, 04/26/2012  . Influenza Whole 05/10/2008, 05/01/2009, 04/15/2010  . Influenza, High Dose Seasonal PF 05/01/2014, 05/14/2015, 04/23/2016, 05/03/2017, 04/28/2018  . Influenza,inj,Quad PF,6+ Mos 05/02/2013  .  Pneumococcal Conjugate-13 10/27/2013  . Pneumococcal Polysaccharide-23 08/03/1998, 02/28/2008  . Td 08/03/1994, 02/08/2007  . Tdap 08/02/2017    Qualifies for Shingles Vaccine?yes  Screening Tests Health Maintenance  Topic Date  Due  . DEXA SCAN  09/27/1998  . TETANUS/TDAP  08/03/2027  . INFLUENZA VACCINE  Completed  . PNA vac Low Risk Adult  Completed    Cancer Screenings: Lung: Low Dose CT Chest recommended if Age 67-80 years, 30 pack-year currently smoking OR have quit w/in 15years. Patient does not qualify. Breast:  Up to date on Mammogram? No   Up to date of Bone Density/Dexa? No Colorectal: aged out  Additional Screenings:  Hepatitis C Screening: N/A     Plan:    Mrs. Estepp understands that she can check with her pharmacy for out of pocket costs for shingrix. She declines to have further mammograms or a DEXA scan due to her age. She is up to date with all other preventive health screenings and immunizations.  I have personally reviewed and noted the following in the patient's chart:   . Medical and social history . Use of alcohol, tobacco or illicit drugs  . Current medications and supplements . Functional ability and status . Nutritional status . Physical activity . Advanced directives . List of other physicians . Hospitalizations, surgeries, and ER visits in previous 12 months . Vitals . Screenings to include cognitive, depression, and falls . Referrals and appointments  In addition, I have reviewed and discussed with patient certain preventive protocols, quality metrics, and best practice recommendations. A written personalized care plan for preventive services as well as general preventive health recommendations were provided to patient.     [name] Forts, LPN  [id number]

## 2019-09-19 NOTE — Patient Instructions (Addendum)
[name] [name] , Thank you for taking time to participate your Medicare Wellness Visit. I appreciate your ongoing commitment to your health goals. Please review the following plan we discussed and let me know if I can assist you in the future.   Screening recommendations/referrals: Colorectal Screening: no longer necessary due to age. Mammogram: despite history of breast cancer, she declines further testing due to her age.  Bone Density: declines due to her age.  Vision and Dental Exams: Recommended annual ophthalmology exams for early detection of glaucoma and other disorders of the eye Recommended annual dental exams for proper oral hygiene  Diabetic Exams: Diabetic Eye Exam: N/A Diabetic Foot Exam: N/A  Vaccinations: Influenza vaccine: completed 05/03/2019; due in fall 2021. Pneumococcal vaccine: completed 02/28/2008 & 10/27/2013. Up to date.  Tdap vaccine: completed 08/02/2017. Due 08/03/2027. Shingles vaccine: Please call your insurance company to determine your out of pocket expense for the Shingrix vaccine. You may receive this vaccine at your local pharmacy.  Advanced directives: Advance directives discussed with you today. Please bring a copy of your POA (Power of [place]) and/or Living Will to your next appointment.  Goals: Recommend to drink at least 6-8 8oz glasses of water per day.  Recommend to exercise for at least 150 minutes per week.  Recommend to remove any items from the home that may cause slips or trips.  Next appointment: Please schedule your Annual Wellness Visit with your Nurse Health Advisor in one year.  Preventive Care 37 Years and Older, Female Preventive care refers to lifestyle choices and visits with your health care provider that can promote health and wellness. What does preventive care include?  A yearly physical exam. This is also called an annual well check.  Dental exams once or twice a year.  Routine eye exams. Ask your health care provider  how often you should have your eyes checked.  Personal lifestyle choices, including:  Daily care of your teeth and gums.  Regular physical activity.  Eating a healthy diet.  Avoiding tobacco and drug use.  Limiting alcohol use.  Practicing safe sex.  Taking low-dose aspirin every day if recommended by your health care provider.  Taking vitamin and mineral supplements as recommended by your health care provider. What happens during an annual well check? The services and screenings done by your health care provider during your annual well check will depend on your age, overall health, lifestyle risk factors, and family history of disease. Counseling  Your health care provider may ask you questions about your:  Alcohol use.  Tobacco use.  Drug use.  Emotional well-being.  Home and relationship well-being.  Sexual activity.  Eating habits.  History of falls.  Memory and ability to understand (cognition).  Work and work Statistician.  Reproductive health. Screening  You may have the following tests or measurements:  Height, weight, and BMI.  Blood pressure.  Lipid and cholesterol levels. These may be checked every 5 years, or more frequently if you are over 39 years old.  Skin check.  Lung cancer screening. You may have this screening every year starting at age 48 if you have a 30-pack-year history of smoking and currently smoke or have quit within the past 15 years.  Fecal occult blood test (FOBT) of the stool. You may have this test every year starting at age 39.  Flexible sigmoidoscopy or colonoscopy. You may have a sigmoidoscopy every 5 years or a colonoscopy every 10 years starting at age 93.  Hepatitis C blood test.  Hepatitis B blood test.  Sexually transmitted disease (STD) testing.  Diabetes screening. This is done by checking your blood sugar (glucose) after you have not eaten for a while (fasting). You may have this done every 1-3  years.  Bone density scan. This is done to screen for osteoporosis. You may have this done starting at age 67.  Mammogram. This may be done every 1-2 years. Talk to your health care provider about how often you should have regular mammograms. Talk with your health care provider about your test results, treatment options, and if necessary, the need for more tests. Vaccines  Your health care provider may recommend certain vaccines, such as:  Influenza vaccine. This is recommended every year.  Tetanus, diphtheria, and acellular pertussis (Tdap, Td) vaccine. You may need a Td booster every 10 years.  Zoster vaccine. You may need this after age 60.  Pneumococcal 13-valent conjugate (PCV13) vaccine. One dose is recommended after age 72.  Pneumococcal polysaccharide (PPSV23) vaccine. One dose is recommended after age 55. Talk to your health care provider about which screenings and vaccines you need and how often you need them. This information is not intended to replace advice given to you by your health care provider. Make sure you discuss any questions you have with your health care provider. Document Released: 08/16/2015 Document Revised: 04/08/2016 Document Reviewed: 05/21/2015 Elsevier Interactive Patient Education  2017 [employer] Prevention in the Home Falls can cause injuries. They can happen to people of all ages. There are many things you can do to make your home safe and to help prevent falls. What can I do on the outside of my home?  Regularly fix the edges of walkways and driveways and fix any cracks.  Remove anything that might make you trip as you walk through a door, such as a raised step or threshold.  Trim any bushes or trees on the path to your home.  Use bright outdoor lighting.  Clear any walking paths of anything that might make someone trip, such as rocks or tools.  Regularly check to see if handrails are loose or broken. Make sure that both sides of any  steps have handrails.  Any raised decks and porches should have guardrails on the edges.  Have any leaves, snow, or ice cleared regularly.  Use sand or salt on walking paths during winter.  Clean up any spills in your garage right away. This includes oil or grease spills. What can I do in the bathroom?  Use night lights.  Install grab bars by the toilet and in the tub and shower. Do not use towel bars as grab bars.  Use non-skid mats or decals in the tub or shower.  If you need to sit down in the shower, use a plastic, non-slip stool.  Keep the floor dry. Clean up any water that spills on the floor as soon as it happens.  Remove soap buildup in the tub or shower regularly.  Attach bath mats securely with double-sided non-slip rug tape.  Do not have throw rugs and other things on the floor that can make you trip. What can I do in the bedroom?  Use night lights.  Make sure that you have a light by your bed that is easy to reach.  Do not use any sheets or blankets that are too big for your bed. They should not hang down onto the floor.  Have a firm chair that has side arms. You can use this  for support while you get dressed.  Do not have throw rugs and other things on the floor that can make you trip. What can I do in the kitchen?  Clean up any spills right away.  Avoid walking on wet floors.  Keep items that you use a lot in easy-to-reach places.  If you need to reach something above you, use a strong step stool that has a grab bar.  Keep electrical cords out of the way.  Do not use floor polish or wax that makes floors slippery. If you must use wax, use non-skid floor wax.  Do not have throw rugs and other things on the floor that can make you trip. What can I do with my stairs?  Do not leave any items on the stairs.  Make sure that there are handrails on both sides of the stairs and use them. Fix handrails that are broken or loose. Make sure that handrails are  as long as the stairways.  Check any carpeting to make sure that it is firmly attached to the stairs. Fix any carpet that is loose or worn.  Avoid having throw rugs at the top or bottom of the stairs. If you do have throw rugs, attach them to the floor with carpet tape.  Make sure that you have a light switch at the top of the stairs and the bottom of the stairs. If you do not have them, ask someone to add them for you. What else can I do to help prevent falls?  Wear shoes that:  Do not have high heels.  Have rubber bottoms.  Are comfortable and fit you well.  Are closed at the toe. Do not wear sandals.  If you use a stepladder:  Make sure that it is fully opened. Do not climb a closed stepladder.  Make sure that both sides of the stepladder are locked into place.  Ask someone to hold it for you, if possible.  Clearly mark and make sure that you can see:  Any grab bars or handrails.  First and last steps.  Where the edge of each step is.  Use tools that help you move around (mobility aids) if they are needed. These include:  Canes.  Walkers.  Scooters.  Crutches.  Turn on the lights when you go into a dark area. Replace any light bulbs as soon as they burn out.  Set up your furniture so you have a clear path. Avoid moving your furniture around.  If any of your floors are uneven, fix them.  If there are any pets around you, be aware of where they are.  Review your medicines with your doctor. Some medicines can make you feel dizzy. This can increase your chance of falling. Ask your doctor what other things that you can do to help prevent falls. This information is not intended to replace advice given to you by your health care provider. Make sure you discuss any questions you have with your health care provider. Document Released: 05/16/2009 Document Revised: 12/26/2015 Document Reviewed: 08/24/2014 Elsevier Interactive Patient Education  2017 [employer].

## 2019-09-26 ENCOUNTER — Encounter: Payer: BLUE CROSS/BLUE SHIELD | Admitting: Internal Medicine

## 2019-10-06 ENCOUNTER — Telehealth: Payer: Self-pay | Admitting: Internal Medicine

## 2019-10-06 DIAGNOSIS — Z9012 Acquired absence of left breast and nipple: Secondary | ICD-10-CM

## 2019-10-06 DIAGNOSIS — Z853 Personal history of malignant neoplasm of breast: Secondary | ICD-10-CM

## 2019-10-06 NOTE — Telephone Encounter (Signed)
Pts husband has been notified that this is placed up front

## 2019-10-06 NOTE — Telephone Encounter (Signed)
Pt is requesting a new prosthesis, hers is starting to wear. She would like it to be mailed or she can come pick it up.

## 2019-10-06 NOTE — Telephone Encounter (Signed)
I thought I did this but  Ok   On your desk

## 2019-10-06 NOTE — Telephone Encounter (Signed)
Please write out prescription

## 2019-10-23 ENCOUNTER — Other Ambulatory Visit: Payer: Self-pay | Admitting: Internal Medicine

## 2019-10-24 DIAGNOSIS — C50912 Malignant neoplasm of unspecified site of left female breast: Secondary | ICD-10-CM | POA: Diagnosis not present

## 2019-10-25 ENCOUNTER — Telehealth: Payer: Self-pay

## 2019-10-25 NOTE — Telephone Encounter (Signed)
Form came in from [facility]. Placed in PCP red folder.

## 2019-10-31 NOTE — Telephone Encounter (Signed)
Signed mastectomy supplises porthesis order

## 2019-10-31 NOTE — Telephone Encounter (Signed)
Noted! Form faxed

## 2019-11-07 DIAGNOSIS — C50912 Malignant neoplasm of unspecified site of left female breast: Secondary | ICD-10-CM | POA: Diagnosis not present

## 2019-11-27 ENCOUNTER — Encounter: Payer: Self-pay | Admitting: Internal Medicine

## 2020-01-25 ENCOUNTER — Other Ambulatory Visit: Payer: Self-pay | Admitting: Internal Medicine

## 2020-03-01 ENCOUNTER — Ambulatory Visit: Payer: Medicare Other | Admitting: Internal Medicine

## 2020-03-05 ENCOUNTER — Encounter: Payer: BLUE CROSS/BLUE SHIELD | Admitting: Internal Medicine

## 2020-03-10 NOTE — Progress Notes (Signed)
Chief Complaint  Patient presents with  . Annual Exam    Doing okay, bruising easily  . Medication Management  . Hypertension    HPI: [name] [name] 86 y.o. comes in today for Preventive Medicare exam/ wellness visit .Since last visit. No major change health . Husband now mostly in wc so she is responsible for  Many tasks all phyiscal  And chores   Bp  ok BG avoiding sugars and breads   May be cause of some weight loss  Hx breast cancer   No mammogram as she states will never take  Chemo  Has had the covid vaccine   Had mild cough a month ago  No sx now  Health Maintenance  Topic Date Due  . COVID-19 Vaccine (1) Never done  . DEXA SCAN  Never done  . INFLUENZA VACCINE  03/03/2020  . TETANUS/TDAP  08/03/2027  . PNA vac Low Risk Adult  Completed   Health Maintenance Review LIFESTYLE:  Exercise:  Active  Tobacco/ETS:n Alcohol: n Sugar beverages:n Sleep: Drug use: no HH: 2   Hearing:   ? Worse never got to audiology yet may be getting worse    Vision:  No limitations at present . Last eye check UTD glasses   Safety:  Has smoke detector and wears seat belts.   No excess sun exposure. Sees dentist regularly.  Falls: n  Memory: Felt to be good  , no concern from her or her family.  Depression: No anhedonia unusual crying or depressive symptoms  Nutrition: Eats well balanced diet; adequate calcium and vitamin D. No swallowing chewing problems.  Injury: no major injuries in the last six months.  Other healthcare providers:  Reviewed today .    Preventive parameters: has decline some measures in past   Reviewed   ADLS:   There are no problems or need for assistance  driving, feeding, obtaining food, dressing, toileting and bathing, managing money using phone. She is independent   ROS:  GEN/ HEENT: No fever, significant weight changes sweats headaches vision problems hearing changes, CV/ PULM; No chest pain shortness of breath cough, syncope,edema  change in  exercise tolerance. GI /GU: No adominal pain, vomiting, change in bowel habits. No blood in the stool. No significant GU symptoms. SKIN/HEME: ,no acute skin rashes suspicious lesions or bleeding. No lymphadenopathy, nodules, masses.  NEURO/ PSYCH:  No neurologic signs such as weakness numbness. No depression anxiety. IMM/ Allergy: No unusual infections.  Allergy .   REST of 12 system review negative except as per HPI   Past Medical History:  Diagnosis Date  . Adenomatous colon polyp     4 2008  . Breast cancer ([place]) 1978   left breast  . Complication of anesthesia   . Cystitis, interstitial   . Fracture    left ankle and rt wrist  . FRACTURE, ANKLE, LEFT 12/14/2006   Qualifier: Diagnosis of  By: Hulan Saas, CMA (AAMA), [place]   . FRACTURE, WRIST 12/14/2006   Qualifier: Diagnosis of  By: Hulan Saas, CMA (AAMA), [place] Skye   . Hyperlipidemia   . Hypertension   . Osteoarthritis   . PONV (postoperative nausea and vomiting)    Episode after Ether  . Rotator cuff tear, right   . Shingles   . Urinary incontinence     Family History  Problem Relation Age of Onset  . Suicidality Mother   . Heart attack Father   . Cancer - Other Daughter  throat cancer   . Thyroid disease Neg Hx     Social History   Socioeconomic History  . Marital status: Married    Spouse name: Not on file  . Number of children: 3  . Years of education: Not on file  . 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 Smoking status: Former Research scientist (life sciences)  . Smokeless tobacco: Never Used  . 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 Vaping Use: Never used  Substance and Sexual Activity  . Alcohol use: Not Currently    Comment: none at this time   . Drug use: No  . Sexual activity: Not Currently  Other Topics Concern  . Not on file  Social History Narrative   Retired   Married   [place] of 2   Husband  with medical problems   g5 p3   Ex smoker  Quit 33 years ago  1952- 1968      09/19/2019: Has 3 children, one local son who can help occasionally if needed   Struggling with taking care of husband at home who is partially blind, but pt. not interested in caregiver or respite support   Enjoys reading, walking around the house.   Social Determinants of Health   Financial Resource Strain: Low Risk   . Difficulty of Paying Living Expenses: Not hard at all  Food Insecurity: No Food Insecurity  . Worried About Charity fundraiser in the Last Year: Never true  . Ran Out of Food in the Last Year: Never true  Transportation Needs: No Transportation Needs  . Lack of Transportation (Medical): No  . Lack of Transportation (Non-Medical): No  Physical Activity: Sufficiently Active  . Days of Exercise per Week: 5 days  . Minutes of Exercise per Session: 30 min  Stress: Stress Concern Present  . Feeling of Stress : Very much  Social Connections: Unknown  . Frequency of Communication with Friends and Family: More than three times a week  . Frequency of Social Gatherings with Friends and Family: Once a week  . Attends Religious Services: Not on file  . Active Member of Clubs or Organizations: Not on file  . Attends Archivist Meetings: Not on file  . Marital Status: Married    Outpatient Encounter Medications as of 03/11/2020  Medication Sig  . amLODipine (NORVASC) 5 MG tablet Take 1 tablet by mouth once daily  . Ascorbic Acid (VITAMIN C) 500 MG tablet Take 500 mg by mouth 2 (two) times daily.   . Calcium Carb-Cholecalciferol (CALCIUM 600+D3) 600-800 MG-UNIT TABS Take 1 tablet by mouth daily.  Marland Kitchen CINNAMON PO Take 1,000 mg by mouth 2 (two) times daily.   Marland Kitchen doxylamine, Sleep, (UNISOM) 25 MG tablet Take 25 mg by mouth at bedtime as needed for sleep.  Marland Kitchen loratadine (CLARITIN) 10 MG tablet Take 10 mg by mouth daily as needed for allergies.  . Magnesium 400 MG TABS Take 400 mg by mouth daily.     . Multiple Vitamins-Minerals (CENTRUM SILVER PO) Take 1 tablet by mouth daily.   . Vitamin D, Cholecalciferol, 1000 UNITS TABS Take 1,000 Units by mouth daily.   . fish oil-omega-3 fatty acids 1000 MG capsule Take 1 g by mouth 2 (two) times daily.  (Patient not taking: Reported on 03/11/2020)   No facility-administered encounter medications on file as  of 03/11/2020.    EXAM:  BP 118/70   Pulse (!) 41   Temp 97.8 F (36.6 C) (Oral)   Ht 5' 2.75" (1.594 m)   Wt 118 lb 9.6 oz (53.8 kg)   SpO2 98%   BMI 21.18 kg/m   Body mass index is 21.18 kg/m.  Physical Exam: Vital signs reviewed MVE:HMCN is a well-developed well-nourished alert cooperative   who appears stated age in no acute distress. Some dec haering  HEENT: normocephalic atraumatic , Eyes: PERRL EOM's full, conjunctiva clear, Nares: paten,t no deformity discharge or tenderness., Ears: no deformity EAC's clear TMs with normal landmarks. Mouth: masked NECK: supple without masses, thyromegaly or bruits. CHEST/PULM:  Clear to auscultation and percussion breath sounds equal no wheeze , rales or rhonchi. No chest wall deformities or tenderness.  Left breast absent   Right breast   Fullness   Mass soft defined   ? If nipple deformed  Non tender   CV: PMI is nondisplaced, S1 S2  rrno gallops, murmurs, rubs. Peripheral pulses are present without delay.No JVD .  ABDOMEN: Bowel sounds normal nontender  No guard or rebound, no hepato splenomegal no CVA tenderness.   umbilicare area is out  Non tender  Extremtities:  No clubbing cyanosis or edema, no acute joint swelling or redness no focal atrophy some djd changes  NEURO:  Oriented x3, cranial nerves 3-12 appear to be intact, no obvious focal weakness,gait within normal limits no abnormal reflexes or asymmetrical SKIN: No acute rashes normal turgor, color,  forarms with senile ecchymosis  No petechiae  PSYCH: Oriented, good eye contact, no obvious depression anxiety, cognition and judgment  appear normal. LN: no cervical axillary inguinal adenopathy No noted deficits in memory, attention, and speech.   Lab Results  Component Value Date   WBC 5.5 03/03/2019   HGB 14.9 03/03/2019   HCT 44.3 03/03/2019   PLT 170.0 03/03/2019   GLUCOSE 116 (H) 03/03/2019   CHOL 222 (H) 03/03/2019   TRIG 141.0 03/03/2019   HDL 53.00 03/03/2019   LDLDIRECT 165.0 12/07/2006   LDLCALC 140 (H) 03/03/2019   ALT 19 03/03/2019   AST 18 03/03/2019   NA 143 03/03/2019   K 4.6 03/03/2019   CL 108 03/03/2019   CREATININE 0.95 03/03/2019   BUN 23 03/03/2019   CO2 27 03/03/2019   TSH 1.75 03/03/2019   HGBA1C 5.7 (A) 08/29/2019   Fasting today  ASSESSMENT AND PLAN:  Discussed the following assessment and plan:  Visit for preventive health examination  Essential hypertension - Plan: Basic metabolic panel, CBC with Differential/Platelet, Hemoglobin A1c, Hepatic function panel, Lipid panel, Lipid panel, Hepatic function panel, Hemoglobin A1c, CBC with Differential/Platelet, Basic metabolic panel  Fasting hyperglycemia - Plan: Basic metabolic panel, CBC with Differential/Platelet, Hemoglobin A1c, Hepatic function panel, Lipid panel, Lipid panel, Hepatic function panel, Hemoglobin A1c, CBC with Differential/Platelet, Basic metabolic panel  Medication management - Plan: Basic metabolic panel, CBC with Differential/Platelet, Hemoglobin A1c, Hepatic function panel, Lipid panel, Lipid panel, Hepatic function panel, Hemoglobin A1c, CBC with Differential/Platelet, Basic metabolic panel  Decreased hearing of both ears - Plan: Basic metabolic panel, CBC with Differential/Platelet, Hemoglobin A1c, Hepatic function panel, Lipid panel, Lipid panel, Hepatic function panel, Hemoglobin A1c, CBC with Differential/Platelet, Basic metabolic panel  History of breast cancer - Plan: Basic metabolic panel, CBC with Differential/Platelet, Hemoglobin A1c, Hepatic function panel, Lipid panel, Lipid panel, Hepatic function  panel, Hemoglobin A1c, CBC with Differential/Platelet, Basic metabolic panel, MM Digital Diagnostic Unilat R  Hyperlipidemia, unspecified hyperlipidemia type - Plan: Basic metabolic panel, CBC with Differential/Platelet, Hemoglobin A1c, Hepatic function panel, Lipid panel, Lipid panel, Hepatic function panel, Hemoglobin A1c, CBC with Differential/Platelet, Basic metabolic panel  Breast thickening / mass  - r breast  - Plan: MM Digital Diagnostic Unilat R  Senile ecchymosis - fore arms  in active person   Lab monitoing  Refer for diagnostic mammo     She is reluctant to do certain  rx if abnormal  But encouraged  To get info before deciding anything  Did not discuss dexa  At this visit  Patient Care Team: Skylah Delauter, Standley Brooking, MD as PCP - General Franchot Gallo, MD (Urology)  Patient Instructions   Lab today  Bp is good  Ordering a diagnostic mammogram for right breast.   Since I feel the fullness  This time.  Work on  Not losing weight . increase  Full dairy .  Beans yogurt  Avocados etc .      The bruising is probably just from aging skin effects with minor trauma .  Immunization History  Administered Date(s) Administered  . Fluad Quad(high Dose 65+) 05/03/2019  . Influenza Split 04/21/2011, 04/26/2012  . Influenza Whole 05/10/2008, 05/01/2009, 04/15/2010  . Influenza, High Dose Seasonal PF 05/01/2014, 05/14/2015, 04/23/2016, 05/03/2017, 04/28/2018  . Influenza,inj,Quad PF,6+ Mos 05/02/2013  . Pneumococcal Conjugate-13 10/27/2013  . Pneumococcal Polysaccharide-23 08/03/1998, 02/28/2008  . Td 08/03/1994, 02/08/2007  . Tdap 08/02/2017       Health Maintenance, Female Adopting a healthy lifestyle and getting preventive care are important in promoting health and wellness. Ask your health care provider about:  The right schedule for you to have regular tests and exams.  Things you can do on your own to prevent diseases and keep yourself healthy. What should I know about  diet, weight, and exercise? Eat a healthy diet   Eat a diet that includes plenty of vegetables, fruits, low-fat dairy products, and lean protein.  Do not eat a lot of foods that are high in solid fats, added sugars, or sodium. Maintain a healthy weight Body mass index (BMI) is used to identify weight problems. It estimates body fat based on height and weight. Your health care provider can help determine your BMI and help you achieve or maintain a healthy weight. Get regular exercise Get regular exercise. This is one of the most important things you can do for your health. Most adults should:  Exercise for at least 150 minutes each week. The exercise should increase your heart rate and make you sweat (moderate-intensity exercise).  Do strengthening exercises at least twice a week. This is in addition to the moderate-intensity exercise.  Spend less time sitting. Even light physical activity can be beneficial. Watch cholesterol and blood lipids Have your blood tested for lipids and cholesterol at 84 years of age, then have this test every 5 years. Have your cholesterol levels checked more often if:  Your lipid or cholesterol levels are high.  You are older than 84 years of age.  You are at high risk for heart disease. What should I know about cancer screening? Depending on your health history and family history, you may need to have cancer screening at various ages. This may include screening for:  Breast cancer.  Cervical cancer.  Colorectal cancer.  Skin cancer.  Lung cancer. What should I know about heart disease, diabetes, and high blood pressure? Blood pressure and heart disease  High blood pressure causes heart disease and increases  the risk of stroke. This is more likely to develop in people who have high blood pressure readings, are of African descent, or are overweight.  Have your blood pressure checked: ? Every 3-5 years if you are 57-29 years of age. ? Every year  if you are 62 years old or older. Diabetes Have regular diabetes screenings. This checks your fasting blood sugar level. Have the screening done:  Once every three years after age 16 if you are at a normal weight and have a low risk for diabetes.  More often and at a younger age if you are overweight or have a high risk for diabetes. What should I know about preventing infection? Hepatitis B If you have a higher risk for hepatitis B, you should be screened for this virus. Talk with your health care provider to find out if you are at risk for hepatitis B infection. Hepatitis C Testing is recommended for:  Everyone born from [birth year] through 1963.  Anyone with known risk factors for hepatitis C. Sexually transmitted infections (STIs)  Get screened for STIs, including gonorrhea and chlamydia, if: ? You are sexually active and are younger than 84 years of age. ? You are older than 84 years of age and your health care provider tells you that you are at risk for this type of infection. ? Your sexual activity has changed since you were last screened, and you are at increased risk for chlamydia or gonorrhea. Ask your health care provider if you are at risk.  Ask your health care provider about whether you are at high risk for HIV. Your health care provider may recommend a prescription medicine to help prevent HIV infection. If you choose to take medicine to prevent HIV, you should first get tested for HIV. You should then be tested every 3 months for as long as you are taking the medicine. Pregnancy  If you are about to stop having your period (premenopausal) and you may become pregnant, seek counseling before you get pregnant.  Take 400 to 800 micrograms (mcg) of folic acid every day if you become pregnant.  Ask for birth control (contraception) if you want to prevent pregnancy. Osteoporosis and menopause Osteoporosis is a disease in which the bones lose minerals and strength with aging. This  can result in bone fractures. If you are 24 years old or older, or if you are at risk for osteoporosis and fractures, ask your health care provider if you should:  Be screened for bone loss.  Take a calcium or vitamin D supplement to lower your risk of fractures.  Be given hormone replacement therapy (HRT) to treat symptoms of menopause. Follow these instructions at home: Lifestyle  Do not use any products that contain nicotine or tobacco, such as cigarettes, e-cigarettes, and chewing tobacco. If you need help quitting, ask your health care provider.  Do not use street drugs.  Do not share needles.  Ask your health care provider for help if you need support or information about quitting drugs. Alcohol use  Do not drink alcohol if: ? Your health care provider tells you not to drink. ? You are pregnant, may be pregnant, or are planning to become pregnant.  If you drink alcohol: ? Limit how much you use to 0-1 drink a day. ? Limit intake if you are breastfeeding.  Be aware of how much alcohol is in your drink. In the [place] one drink equals one 12 oz bottle of beer (355 mL), one 5 oz glass  of wine (148 mL), or one 1 oz glass of hard liquor (44 mL). General instructions  Schedule regular health, dental, and eye exams.  Stay current with your vaccines.  Tell your health care provider if: ? You often feel depressed. ? You have ever been abused or do not feel safe at home. Summary  Adopting a healthy lifestyle and getting preventive care are important in promoting health and wellness.  Follow your health care provider's instructions about healthy diet, exercising, and getting tested or screened for diseases.  Follow your health care provider's instructions on monitoring your cholesterol and blood pressure. This information is not intended to replace advice given to you by your health care provider. Make sure you discuss any questions you have with your health care  provider. Document Revised: 07/13/2018 Document Reviewed: 07/13/2018 Elsevier Patient Education  2020 [place] Danella Philson M.D.

## 2020-03-11 ENCOUNTER — Ambulatory Visit (INDEPENDENT_AMBULATORY_CARE_PROVIDER_SITE_OTHER): Payer: Medicare Other | Admitting: Internal Medicine

## 2020-03-11 ENCOUNTER — Other Ambulatory Visit: Payer: Self-pay

## 2020-03-11 ENCOUNTER — Encounter: Payer: Self-pay | Admitting: Internal Medicine

## 2020-03-11 VITALS — BP 118/70 | HR 68 | Temp 97.8°F | Ht 62.75 in | Wt 118.6 lb

## 2020-03-11 DIAGNOSIS — R7301 Impaired fasting glucose: Secondary | ICD-10-CM

## 2020-03-11 DIAGNOSIS — N6459 Other signs and symptoms in breast: Secondary | ICD-10-CM

## 2020-03-11 DIAGNOSIS — I10 Essential (primary) hypertension: Secondary | ICD-10-CM | POA: Diagnosis not present

## 2020-03-11 DIAGNOSIS — Z79899 Other long term (current) drug therapy: Secondary | ICD-10-CM

## 2020-03-11 DIAGNOSIS — E785 Hyperlipidemia, unspecified: Secondary | ICD-10-CM | POA: Diagnosis not present

## 2020-03-11 DIAGNOSIS — Z853 Personal history of malignant neoplasm of breast: Secondary | ICD-10-CM

## 2020-03-11 DIAGNOSIS — Z0000 Encounter for general adult medical examination without abnormal findings: Secondary | ICD-10-CM | POA: Diagnosis not present

## 2020-03-11 DIAGNOSIS — R233 Spontaneous ecchymoses: Secondary | ICD-10-CM

## 2020-03-11 DIAGNOSIS — H9193 Unspecified hearing loss, bilateral: Secondary | ICD-10-CM

## 2020-03-11 NOTE — Patient Instructions (Addendum)
Lab today  Bp is good  Ordering a diagnostic mammogram for right breast.   Since I feel the fullness  This time.  Work on  Not losing weight . increase  Full dairy .  Beans yogurt  Avocados etc .      The bruising is probably just from aging skin effects with minor trauma .  Immunization History  Administered Date(s) Administered  . Fluad Quad(high Dose 65+) 05/03/2019  . Influenza Split 04/21/2011, 04/26/2012  . Influenza Whole 05/10/2008, 05/01/2009, 04/15/2010  . Influenza, High Dose Seasonal PF 05/01/2014, 05/14/2015, 04/23/2016, 05/03/2017, 04/28/2018  . Influenza,inj,Quad PF,6+ Mos 05/02/2013  . Pneumococcal Conjugate-13 10/27/2013  . Pneumococcal Polysaccharide-23 08/03/1998, 02/28/2008  . Td 08/03/1994, 02/08/2007  . Tdap 08/02/2017       Health Maintenance, Female Adopting a healthy lifestyle and getting preventive care are important in promoting health and wellness. Ask your health care provider about:  The right schedule for you to have regular tests and exams.  Things you can do on your own to prevent diseases and keep yourself healthy. What should I know about diet, weight, and exercise? Eat a healthy diet   Eat a diet that includes plenty of vegetables, fruits, low-fat dairy products, and lean protein.  Do not eat a lot of foods that are high in solid fats, added sugars, or sodium. Maintain a healthy weight Body mass index (BMI) is used to identify weight problems. It estimates body fat based on height and weight. Your health care provider can help determine your BMI and help you achieve or maintain a healthy weight. Get regular exercise Get regular exercise. This is one of the most important things you can do for your health. Most adults should:  Exercise for at least 150 minutes each week. The exercise should increase your heart rate and make you sweat (moderate-intensity exercise).  Do strengthening exercises at least twice a week. This is in addition to  the moderate-intensity exercise.  Spend less time sitting. Even light physical activity can be beneficial. Watch cholesterol and blood lipids Have your blood tested for lipids and cholesterol at 84 years of age, then have this test every 5 years. Have your cholesterol levels checked more often if:  Your lipid or cholesterol levels are high.  You are older than 84 years of age.  You are at high risk for heart disease. What should I know about cancer screening? Depending on your health history and family history, you may need to have cancer screening at various ages. This may include screening for:  Breast cancer.  Cervical cancer.  Colorectal cancer.  Skin cancer.  Lung cancer. What should I know about heart disease, diabetes, and high blood pressure? Blood pressure and heart disease  High blood pressure causes heart disease and increases the risk of stroke. This is more likely to develop in people who have high blood pressure readings, are of African descent, or are overweight.  Have your blood pressure checked: ? Every 3-5 years if you are 13-69 years of age. ? Every year if you are 65 years old or older. Diabetes Have regular diabetes screenings. This checks your fasting blood sugar level. Have the screening done:  Once every three years after age 82 if you are at a normal weight and have a low risk for diabetes.  More often and at a younger age if you are overweight or have a high risk for diabetes. What should I know about preventing infection? Hepatitis B If you have  a higher risk for hepatitis B, you should be screened for this virus. Talk with your health care provider to find out if you are at risk for hepatitis B infection. Hepatitis C Testing is recommended for:  Everyone born from [birth year] through 1963.  Anyone with known risk factors for hepatitis C. Sexually transmitted infections (STIs)  Get screened for STIs, including gonorrhea and chlamydia, if: ? You  are sexually active and are younger than 84 years of age. ? You are older than 84 years of age and your health care provider tells you that you are at risk for this type of infection. ? Your sexual activity has changed since you were last screened, and you are at increased risk for chlamydia or gonorrhea. Ask your health care provider if you are at risk.  Ask your health care provider about whether you are at high risk for HIV. Your health care provider may recommend a prescription medicine to help prevent HIV infection. If you choose to take medicine to prevent HIV, you should first get tested for HIV. You should then be tested every 3 months for as long as you are taking the medicine. Pregnancy  If you are about to stop having your period (premenopausal) and you may become pregnant, seek counseling before you get pregnant.  Take 400 to 800 micrograms (mcg) of folic acid every day if you become pregnant.  Ask for birth control (contraception) if you want to prevent pregnancy. Osteoporosis and menopause Osteoporosis is a disease in which the bones lose minerals and strength with aging. This can result in bone fractures. If you are 35 years old or older, or if you are at risk for osteoporosis and fractures, ask your health care provider if you should:  Be screened for bone loss.  Take a calcium or vitamin D supplement to lower your risk of fractures.  Be given hormone replacement therapy (HRT) to treat symptoms of menopause. Follow these instructions at home: Lifestyle  Do not use any products that contain nicotine or tobacco, such as cigarettes, e-cigarettes, and chewing tobacco. If you need help quitting, ask your health care provider.  Do not use street drugs.  Do not share needles.  Ask your health care provider for help if you need support or information about quitting drugs. Alcohol use  Do not drink alcohol if: ? Your health care provider tells you not to drink. ? You are  pregnant, may be pregnant, or are planning to become pregnant.  If you drink alcohol: ? Limit how much you use to 0-1 drink a day. ? Limit intake if you are breastfeeding.  Be aware of how much alcohol is in your drink. In the [place] one drink equals one 12 oz bottle of beer (355 mL), one 5 oz glass of wine (148 mL), or one 1 oz glass of hard liquor (44 mL). General instructions  Schedule regular health, dental, and eye exams.  Stay current with your vaccines.  Tell your health care provider if: ? You often feel depressed. ? You have ever been abused or do not feel safe at home. Summary  Adopting a healthy lifestyle and getting preventive care are important in promoting health and wellness.  Follow your health care provider's instructions about healthy diet, exercising, and getting tested or screened for diseases.  Follow your health care provider's instructions on monitoring your cholesterol and blood pressure. This information is not intended to replace advice given to you by your health care provider. Make sure  you discuss any questions you have with your health care provider. Document Revised: 07/13/2018 Document Reviewed: 07/13/2018 Elsevier Patient Education  2020 [employer].

## 2020-03-12 LAB — CBC WITH DIFFERENTIAL/PLATELET
Absolute Monocytes: 589 cells/uL (ref 200–950)
Basophils Absolute: 62 cells/uL (ref 0–200)
Basophils Relative: 1 %
Eosinophils Absolute: 161 cells/uL (ref 15–500)
Eosinophils Relative: 2.6 %
HCT: 44 % (ref 35.0–45.0)
Hemoglobin: 14.4 g/dL (ref 11.7–15.5)
Lymphs Abs: 1364 cells/uL (ref 850–3900)
MCH: 30.6 pg (ref 27.0–33.0)
MCHC: 32.7 g/dL (ref 32.0–36.0)
MCV: 93.6 fL (ref 80.0–100.0)
MPV: 11.6 fL (ref 7.5–12.5)
Monocytes Relative: 9.5 %
Neutro Abs: 4024 cells/uL (ref 1500–7800)
Neutrophils Relative %: 64.9 %
Platelets: 181 10*3/uL (ref 140–400)
RBC: 4.7 10*6/uL (ref 3.80–5.10)
RDW: 12.6 % (ref 11.0–15.0)
Total Lymphocyte: 22 %
WBC: 6.2 10*3/uL (ref 3.8–10.8)

## 2020-03-12 LAB — HEMOGLOBIN A1C
Hgb A1c MFr Bld: 5.7 % of total Hgb — ABNORMAL HIGH (ref <5.7)
Mean Plasma Glucose: 117 (calc)
eAG (mmol/L): 6.5 (calc)

## 2020-03-12 LAB — BASIC METABOLIC PANEL
BUN/Creatinine Ratio: 24 (calc) — ABNORMAL HIGH (ref 6–22)
BUN: 22 mg/dL (ref 7–25)
CO2: 27 mmol/L (ref 20–32)
Calcium: 9.4 mg/dL (ref 8.6–10.4)
Chloride: 108 mmol/L (ref 98–110)
Creat: 0.93 mg/dL — ABNORMAL HIGH (ref 0.60–0.88)
Glucose, Bld: 103 mg/dL — ABNORMAL HIGH (ref 65–99)
Potassium: 4.8 mmol/L (ref 3.5–5.3)
Sodium: 143 mmol/L (ref 135–146)

## 2020-03-12 LAB — HEPATIC FUNCTION PANEL
AG Ratio: 1.9 (calc) (ref 1.0–2.5)
ALT: 30 U/L — ABNORMAL HIGH (ref 6–29)
AST: 27 U/L (ref 10–35)
Albumin: 4.6 g/dL (ref 3.6–5.1)
Alkaline phosphatase (APISO): 69 U/L (ref 37–153)
Bilirubin, Direct: 0.2 mg/dL (ref 0.0–0.2)
Globulin: 2.4 g/dL (calc) (ref 1.9–3.7)
Indirect Bilirubin: 0.7 mg/dL (calc) (ref 0.2–1.2)
Total Bilirubin: 0.9 mg/dL (ref 0.2–1.2)
Total Protein: 7 g/dL (ref 6.1–8.1)

## 2020-03-12 LAB — LIPID PANEL
Cholesterol: 229 mg/dL — ABNORMAL HIGH (ref <200)
HDL: 61 mg/dL (ref >=50)
LDL Cholesterol (Calc): 143 mg/dL (calc) — ABNORMAL HIGH
Non-HDL Cholesterol (Calc): 168 mg/dL (calc) — ABNORMAL HIGH (ref <130)
Total CHOL/HDL Ratio: 3.8 (calc) (ref <5.0)
Triglycerides: 123 mg/dL (ref <150)

## 2020-03-14 NOTE — Progress Notes (Signed)
Blood sugar is good  cholesterol stable. Minor liver panel abnormality   will follow . This  may not be a serious finding  and  often resolves on its own.  Advise   lft panel in 3- 4 months  do not have to fast . Dx abnormal lfts test)  Megan please check to make sure she gets her diagnostic mammogram appt.

## 2020-03-15 ENCOUNTER — Telehealth: Payer: Self-pay

## 2020-03-15 NOTE — Telephone Encounter (Signed)
Patient called and stated that she is concerned about her hepatic panel and I reassured her and she wanted to know if taking Immodium D and a night time sleep aid Diphenhydramine HCL 25 mg would affect this lab?  Please advise.

## 2020-03-15 NOTE — Telephone Encounter (Signed)
Called patient and gave her the message from Dr. [name]. Patient verbalized an understanding. 

## 2020-03-15 NOTE — Telephone Encounter (Signed)
Computer locked up and I had to restart computer when I was trying to leave this message.

## 2020-03-15 NOTE — Telephone Encounter (Signed)
woul d limit if has tylenol in it ( dont think so ) otherwise ok to take these  This abnormality is very mild and we  see this very often doesn not mean you have a serious problem.

## 2020-03-19 ENCOUNTER — Other Ambulatory Visit: Payer: Self-pay | Admitting: Internal Medicine

## 2020-03-19 DIAGNOSIS — N6459 Other signs and symptoms in breast: Secondary | ICD-10-CM

## 2020-03-19 DIAGNOSIS — Z853 Personal history of malignant neoplasm of breast: Secondary | ICD-10-CM

## 2020-04-24 ENCOUNTER — Other Ambulatory Visit: Payer: Medicare Other

## 2020-04-29 ENCOUNTER — Other Ambulatory Visit: Payer: Self-pay | Admitting: Internal Medicine

## 2020-05-07 DIAGNOSIS — H2513 Age-related nuclear cataract, bilateral: Secondary | ICD-10-CM | POA: Diagnosis not present

## 2020-06-05 ENCOUNTER — Other Ambulatory Visit: Payer: Self-pay

## 2020-06-05 ENCOUNTER — Ambulatory Visit
Admission: RE | Admit: 2020-06-05 | Discharge: 2020-06-05 | Disposition: A | Discharge status: Home / Self Care | Payer: Medicare Other | Source: Ambulatory Visit | Attending: Internal Medicine | Admitting: Internal Medicine

## 2020-06-05 DIAGNOSIS — N6459 Other signs and symptoms in breast: Secondary | ICD-10-CM

## 2020-06-05 DIAGNOSIS — R922 Inconclusive mammogram: Secondary | ICD-10-CM | POA: Diagnosis not present

## 2020-06-05 DIAGNOSIS — N6489 Other specified disorders of breast: Secondary | ICD-10-CM | POA: Diagnosis not present

## 2020-06-05 DIAGNOSIS — Z853 Personal history of malignant neoplasm of breast: Secondary | ICD-10-CM

## 2020-06-06 ENCOUNTER — Other Ambulatory Visit: Payer: Self-pay | Admitting: Internal Medicine

## 2020-06-06 DIAGNOSIS — R899 Unspecified abnormal finding in specimens from other organs, systems and tissues: Secondary | ICD-10-CM

## 2020-06-06 NOTE — Progress Notes (Signed)
Per last lab results PCP I created future order for HFP/thx dmf

## 2020-06-07 ENCOUNTER — Telehealth: Payer: Self-pay | Admitting: *Deleted

## 2020-06-07 NOTE — Telephone Encounter (Signed)
Patient called to let Dr Regis Bill know that she went for her breat exam and she came back fine.

## 2020-06-10 NOTE — Telephone Encounter (Signed)
fyi

## 2020-06-11 NOTE — Telephone Encounter (Signed)
Noted/good

## 2020-06-12 ENCOUNTER — Other Ambulatory Visit: Payer: Medicare Other

## 2020-06-12 ENCOUNTER — Other Ambulatory Visit: Payer: Self-pay

## 2020-06-12 DIAGNOSIS — R899 Unspecified abnormal finding in specimens from other organs, systems and tissues: Secondary | ICD-10-CM | POA: Diagnosis not present

## 2020-06-13 LAB — HEPATIC FUNCTION PANEL
AG Ratio: 2 (calc) (ref 1.0–2.5)
ALT: 22 U/L (ref 6–29)
AST: 22 U/L (ref 10–35)
Albumin: 4.4 g/dL (ref 3.6–5.1)
Alkaline phosphatase (APISO): 63 U/L (ref 37–153)
Bilirubin, Direct: 0.1 mg/dL (ref 0.0–0.2)
Globulin: 2.2 g/dL (calc) (ref 1.9–3.7)
Indirect Bilirubin: 0.6 mg/dL (calc) (ref 0.2–1.2)
Total Bilirubin: 0.7 mg/dL (ref 0.2–1.2)
Total Protein: 6.6 g/dL (ref 6.1–8.1)

## 2020-06-13 NOTE — Progress Notes (Signed)
Liver tests are now normal .

## 2020-07-28 ENCOUNTER — Other Ambulatory Visit: Payer: Self-pay | Admitting: Internal Medicine

## 2020-09-10 NOTE — Progress Notes (Signed)
Chief Complaint  Patient presents with  . Follow-up    HPI: [name] [name] 87 y.o. come in for Chronic disease management   6 mos check  BP  Taking amlodipine 5  bp ok no se no syncope  sjhould she stay on?   Continues healthy eating   For bg. Had eva;uation of breast w hx of breast cancer   Benign  Results .  No new problems  ROS: See pertinent positives and negatives per HPI.  Past Medical History:  Diagnosis Date  . Adenomatous colon polyp     4 2008  . Breast cancer ([place]) 1978   left breast  . Complication of anesthesia   . Cystitis, interstitial   . Fracture    left ankle and rt wrist  . FRACTURE, ANKLE, LEFT 12/14/2006   Qualifier: Diagnosis of  By: Hulan Saas, CMA (AAMA), [place]   . FRACTURE, WRIST 12/14/2006   Qualifier: Diagnosis of  By: Hulan Saas, CMA (AAMA), [place]   . Hyperlipidemia   . Hypertension   . Osteoarthritis   . PONV (postoperative nausea and vomiting)    Episode after Ether  . Rotator cuff tear, right   . Shingles   . Urinary incontinence     Family History  Problem Relation Age of Onset  . Suicidality Mother   . Heart attack Father   . Cancer - Other Daughter        throat cancer   . Thyroid disease Neg Hx     Social History   Socioeconomic History  . Marital status: Married    Spouse name: Not on file  . Number of children: 3  . Years of education: Not on file  . 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 Smoking status: Former Research scientist (life sciences)  . Smokeless tobacco: Never Used  . 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 Vaping Use: Never used  Substance and Sexual Activity  . Alcohol use: Not Currently    Comment: none at this time   . Drug use: No  . Sexual activity: Not Currently  Other Topics Concern  . Not on file  Social History Narrative   Retired   Married   [place] of 2   Husband with medical problems   g5 p3    Ex smoker  Quit 33 years ago  1952- 1968      09/19/2019: Has 3 children, one local son who can help occasionally if needed   Struggling with taking care of husband at home who is partially blind, but pt. not interested in caregiver or respite support   Enjoys reading, walking around the house.   Social Determinants of Health   Financial Resource Strain: Low Risk   . Difficulty of Paying Living Expenses: Not hard at all  Food Insecurity: No Food Insecurity  . Worried About Charity fundraiser in the Last Year: Never true  . Ran Out of Food in the Last Year: Never true  Transportation Needs: No Transportation Needs  . Lack of Transportation (Medical): No  . Lack of Transportation (Non-Medical): No  Physical Activity: Sufficiently Active  . Days of Exercise per Week: 5 days  . Minutes of Exercise per Session: 30 min  Stress: Stress Concern Present  . Feeling of Stress : Very much  Social Connections: Unknown  .  Frequency of Communication with Friends and Family: More than three times a week  . Frequency of Social Gatherings with Friends and Family: Once a week  . Attends Religious Services: Not on file  . Active Member of Clubs or Organizations: Not on file  . Attends Archivist Meetings: Not on file  . Marital Status: Married    Outpatient Medications Prior to Visit  Medication Sig Dispense Refill  . amLODipine (NORVASC) 5 MG tablet Take 1 tablet by mouth once daily 90 tablet 0  . Ascorbic Acid (VITAMIN C) 500 MG tablet Take 500 mg by mouth 2 (two) times daily.     . Multiple Vitamins-Minerals (CENTRUM SILVER PO) Take 1 tablet by mouth daily.    . Vitamin D, Cholecalciferol, 1000 UNITS TABS Take 1,000 Units by mouth daily.     . Calcium Carb-Cholecalciferol 600-800 MG-UNIT TABS Take 1 tablet by mouth daily. (Patient not taking: Reported on 09/11/2020)    . CINNAMON PO Take 1,000 mg by mouth 2 (two) times daily.     Marland Kitchen doxylamine, Sleep, (UNISOM) 25 MG tablet Take 25 mg  by mouth at bedtime as needed for sleep. (Patient not taking: Reported on 09/11/2020)    . fish oil-omega-3 fatty acids 1000 MG capsule Take 1 g by mouth 2 (two) times daily.  (Patient not taking: No sig reported)    . loratadine (CLARITIN) 10 MG tablet Take 10 mg by mouth daily as needed for allergies. (Patient not taking: Reported on 09/11/2020)    . Magnesium 400 MG TABS Take 400 mg by mouth daily.  (Patient not taking: Reported on 09/11/2020)     No facility-administered medications prior to visit.     EXAM:  BP 120/60 (BP Location: Right Arm, Patient Position: Sitting, Cuff Size: Normal)   Pulse 76   Temp 97.7 F (36.5 C) (Oral)   Ht 5' 2.75" (1.594 m)   Wt 123 lb 6.4 oz (56 kg)   SpO2 97%   BMI 22.03 kg/m   Body mass index is 22.03 kg/m. Wt Readings from Last 3 Encounters:  09/11/20 123 lb 6.4 oz (56 kg)  03/11/20 118 lb 9.6 oz (53.8 kg)  09/19/19 125 lb (56.7 kg)     GENERAL: vitals reviewed and listed above, alert, oriented, appears well hydrated and in no acute distress hard of hearing has glasses  HEENT: atraumatic, conjunctiva  clear, no obvious abnormalities on inspection of external nose and ears OP : masked  NECK: no obvious masses on inspection palpation  LUNGS: clear to auscultation bilaterally, no wheezes, rales or rhonchi, good air movement CV: HRRR, no clubbing cyanosis or  peripheral edema nl cap refill  MS: moves all extremities without noticeable focal  Abnormality mild djd changes no acute findings  PSYCH: pleasant and cooperative, no obvious depression or anxiety Lab Results  Component Value Date   WBC 6.2 03/11/2020   HGB 14.4 03/11/2020   HCT 44.0 03/11/2020   PLT 181 03/11/2020   GLUCOSE 103 (H) 03/11/2020   CHOL 229 (H) 03/11/2020   TRIG 123 03/11/2020   HDL 61 03/11/2020   LDLDIRECT 165.0 12/07/2006   LDLCALC 143 (H) 03/11/2020   ALT 22 06/12/2020   AST 22 06/12/2020   NA 143 03/11/2020   K 4.8 03/11/2020   CL 108 03/11/2020   CREATININE  0.93 (H) 03/11/2020   BUN 22 03/11/2020   CO2 27 03/11/2020   TSH 1.75 03/03/2019   HGBA1C 5.6 09/11/2020   BP Readings from  Last 3 Encounters:  09/11/20 120/60  03/11/20 118/70  08/29/19 110/64  had lfu vaccine and  Booster covid  In fall  ? October?   ASSESSMENT AND PLAN:  Discussed the following assessment and plan:  Essential hypertension  Medication management  Fasting hyperglycemia - Plan: POCT glycosylated hemoglobin (Hb A1C)  H/O left mastectomy  Decreased hearing of both ears Declines dexa  No fx even after a bad fall and doesn't think would change anything at this time.  If bp getting low we can dec med or try off  .  Weight stable  -Patient advised to return or notify health care team  if  new concerns arise.  Patient Instructions   Your blood pressure is very good today.   If getting low we can try a lower dose  2.5 mg per day . Goal is below 130/80   120/80 range is  Perfect.   Glad to see your weight is up a little bit Continue lifestyle intervention healthy eating and activity .  Bone density  Can give [place] information about bone density and advice about  Medication to bone build . Let us know if you want to  Get this test.   Plan cpx and full lab in about 6 months     Mindee Robledo K. Delbra Zellars M.D.

## 2020-09-11 ENCOUNTER — Encounter: Payer: Self-pay | Admitting: Internal Medicine

## 2020-09-11 ENCOUNTER — Other Ambulatory Visit: Payer: Self-pay

## 2020-09-11 ENCOUNTER — Ambulatory Visit (INDEPENDENT_AMBULATORY_CARE_PROVIDER_SITE_OTHER): Payer: Medicare Other | Admitting: Internal Medicine

## 2020-09-11 VITALS — BP 120/60 | HR 76 | Temp 97.7°F | Ht 62.75 in | Wt 123.4 lb

## 2020-09-11 DIAGNOSIS — Z9012 Acquired absence of left breast and nipple: Secondary | ICD-10-CM | POA: Diagnosis not present

## 2020-09-11 DIAGNOSIS — H9193 Unspecified hearing loss, bilateral: Secondary | ICD-10-CM

## 2020-09-11 DIAGNOSIS — R7301 Impaired fasting glucose: Secondary | ICD-10-CM | POA: Diagnosis not present

## 2020-09-11 DIAGNOSIS — I10 Essential (primary) hypertension: Secondary | ICD-10-CM | POA: Diagnosis not present

## 2020-09-11 DIAGNOSIS — Z79899 Other long term (current) drug therapy: Secondary | ICD-10-CM | POA: Diagnosis not present

## 2020-09-11 LAB — POCT GLYCOSYLATED HEMOGLOBIN (HGB A1C): Hemoglobin A1C: 5.6 % (ref 4.0–5.6)

## 2020-09-11 NOTE — Patient Instructions (Addendum)
  Your blood pressure is very good today.   If getting low we can try a lower dose  2.5 mg per day . Goal is below 130/80   120/80 range is  Perfect.   Glad to see your weight is up a little bit Continue lifestyle intervention healthy eating and activity .  Bone density  Can give [place] information about bone density and advice about  Medication to bone build . Let us know if you want to  Get this test.   Plan cpx and full lab in about 6 months

## 2020-10-31 ENCOUNTER — Other Ambulatory Visit: Payer: Self-pay | Admitting: Internal Medicine

## 2020-11-03 ENCOUNTER — Other Ambulatory Visit: Payer: Self-pay | Admitting: Internal Medicine

## 2020-11-05 ENCOUNTER — Telehealth: Payer: Self-pay | Admitting: Internal Medicine

## 2020-11-05 NOTE — Telephone Encounter (Signed)
Left message for patient to call back and schedule Medicare Annual Wellness Visit (AWV) either virtually or in office.   Last AWV 09/19/19  please schedule at anytime with [facility] Nurse Health Advisor 1 or 2   This should be a 45 minute visit.

## 2020-12-04 ENCOUNTER — Telehealth: Payer: Self-pay | Admitting: Internal Medicine

## 2020-12-04 NOTE — Telephone Encounter (Signed)
Yes it is probably more benefit than not to get this next booster because you are over 85 years of age which is a risk factor for serious complication You get the most protection from the first 2 shots.   You could still get a COVID infection but hopefully be quite mild.  I advise boosters wait until 6 months from the last or before travel into a high prevalence area.

## 2020-12-04 NOTE — Telephone Encounter (Signed)
The patient was calling to find out if Dr. Regis Bill recommends her getting her second [place] booster shot. It's been 6 or 7 months since her 1st booster shot.  Please advise

## 2020-12-05 NOTE — Telephone Encounter (Signed)
Spoke with patient husband, she was unavailable. Message given.

## 2021-01-29 ENCOUNTER — Other Ambulatory Visit: Payer: Self-pay | Admitting: Internal Medicine

## 2021-02-06 ENCOUNTER — Encounter: Payer: Self-pay | Admitting: Internal Medicine

## 2021-03-11 ENCOUNTER — Encounter: Payer: Medicare Other | Admitting: Internal Medicine

## 2021-03-17 NOTE — Progress Notes (Signed)
Chief Complaint  Patient presents with   Annual Exam   Medication Management     HPI: Patient  [name] [name]  87 y.o. comes in today for Preventive Health Care visit  AND MED CHECK No sog change in health  . Works in garden active  les bruising since stopped FO Still hard of hearing not sure if hearing assistance will help  Bp  taks med  ocass light headed when getting up but no syncope  concerns cp sob   Health Maintenance  Topic Date Due   Zoster Vaccines- Shingrix (1 of 2) Never done   DEXA SCAN  Never done   COVID-19 Vaccine (4 - Booster for [employer] series) 03/11/2021   INFLUENZA VACCINE  03/03/2021   TETANUS/TDAP  08/03/2027   PNA vac Low Risk Adult  Completed   HPV VACCINES  Aged Out   Health Maintenance Review LIFESTYLE:  Exercise:  active Tobacco/ETS:n Alcohol: n Sugar beverages:n Sleep:take sleep aid at times  Drug use: no HH of 2   ROS:  GEN/ HEENT: No fever, significant weight changes sweats headaches vision problems hearing changes, CV/ PULM; No chest pain shortness of breath cough, syncope,edema  change in exercise tolerance. GI /GU: No adominal pain, vomiting, change in bowel habits. No blood in the stool. No significant GU symptoms. SKIN/HEME: ,no acute skin rashes suspicious lesions or bleeding. No lymphadenopathy, nodules, masses.  NEURO/ PSYCH:  No neurologic signs such as weakness numbness. No depression anxiety. IMM/ Allergy: No unusual infections.  Allergy .   REST of 12 system review negative except as per HPI   Past Medical History:  Diagnosis Date   Adenomatous colon polyp     4 2008   Breast cancer ([facility]) 1978   left breast   Complication of anesthesia    Cystitis, interstitial    Fracture    left ankle and rt wrist   FRACTURE, ANKLE, LEFT 12/14/2006   Qualifier: Diagnosis of  By: Hulan Saas, CMA (AAMA), Shannon S    FRACTURE, WRIST 12/14/2006   Qualifier: Diagnosis of  By: Hulan Saas, CMA (AAMA), Quita Skye    Hyperlipidemia     Hypertension    Osteoarthritis    PONV (postoperative nausea and vomiting)    Episode after Ether   Rotator cuff tear, right    Shingles    Urinary incontinence     Past Surgical History:  Procedure Laterality Date   ABDOMINAL HYSTERECTOMY  1988   fibroids   DILATION AND CURETTAGE OF UTERUS     x 2   MASTECTOMY, PARTIAL  1978   left   SHOULDER ARTHROSCOPY WITH DISTAL CLAVICLE RESECTION Right 12/23/2017   Procedure: SHOULDER ARTHROSCOPY WITH DISTAL CLAVICLE RESECTION;  Surgeon: Justice Britain, MD;  Location: [facility];  Service: Orthopedics;  Laterality: Right;   SHOULDER ARTHROSCOPY WITH ROTATOR CUFF REPAIR AND SUBACROMIAL DECOMPRESSION Right 12/23/2017   Procedure: SHOULDER ARTHROSCOPY WITH ROTATOR CUFF REPAIR AND SUBACROMIAL DECOMPRESSION, distal clavicle resection;  Surgeon: Justice Britain, MD;  Location: [facility];  Service: Orthopedics;  Laterality: Right;   TONSILLECTOMY     TUBAL LIGATION  27    Family History  Problem Relation Age of Onset   Suicidality Mother    Heart attack Father    Cancer - Other Daughter        throat cancer    Thyroid disease Neg Hx     Social History   Socioeconomic History   Marital status: Married    Spouse name: Not on  file   Number of children: 3   Years of education: Not on file   Highest education level: Not on file  Occupational History    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  Tobacco Use   Smoking status: Former   Smokeless tobacco: Never   Tobacco comments:    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 not interested in caregiver or respite support   Enjoys reading, walking around the house.   Social Determinants of Health   Financial Resource Strain: Not on file  Food Insecurity: Not on file  Transportation Needs: Not on file  Physical Activity: Not on file  Stress: Not on file  Social Connections: Not on file    Outpatient Medications Prior to Visit  Medication Sig Dispense Refill   amLODipine (NORVASC) 5 MG tablet Take 1 tablet by mouth once daily 90 tablet 0   Ascorbic Acid (VITAMIN C) 500 MG tablet Take 500 mg by mouth 2 (two) times daily.      Calcium Carb-Cholecalciferol 600-800 MG-UNIT TABS Take 1 tablet by mouth daily.     CINNAMON PO Take 1,000 mg by mouth 2 (two) times daily.      doxylamine, Sleep, (UNISOM) 25 MG tablet Take 25 mg by mouth at bedtime as needed for sleep.     fish oil-omega-3 fatty acids 1000 MG capsule Take 1 g by mouth 2 (two) times daily.     loratadine (CLARITIN) 10 MG tablet Take 10 mg by mouth daily as needed for allergies.     Magnesium 400 MG TABS Take 400 mg by mouth daily.     Multiple Vitamins-Minerals (CENTRUM SILVER PO) Take 1 tablet by mouth daily.     Vitamin D, Cholecalciferol, 1000 UNITS TABS Take 1,000 Units by mouth daily.      No facility-administered medications prior to visit.     EXAM:  BP 104/60 (BP Location: Left Arm, Patient Position: Sitting, Cuff Size: Normal)   Pulse 75   Temp 98.1 F (36.7 C) (Oral)   Ht 5' 2.75" (1.594 m)   Wt 120 lb (54.4 kg)   SpO2 95%   BMI 21.43 kg/m   Body mass index is 21.43 kg/m. Wt Readings from Last 3 Encounters:  03/18/21 120 lb (54.4 kg)  09/11/20 123 lb 6.4 oz (56 kg)  03/11/20 118 lb 9.6 oz (53.8 kg)    Physical Exam: Vital signs reviewed [id number] is a well-developed well-nourished alert cooperative    who appearsr stated age in no acute distress. Hard of hearing exp with masks  on HEENT: normocephalic atraumatic , Eyes: PERRL EOM's full, conjunctiva clear, Nares: paten,t no deformity discharge or tenderness., Ears: no deformity EAC's clear TMs with normal landmarks. Mouth: masked  NECK: supple without masses, thyromegaly or  bruits. CHEST/PULM:  Clear to auscultation and percussion breath sounds equal no wheeze , rales or rhonchi.  Breast: left absent right no nodules  axilla clear CV: PMI is nondisplaced, S1 S2 no gallops, murmurs, rubs. Peripheral pulses are full without delay.No JVD .  ABDOMEN: Bowel sounds normal nontender  No guard or rebound, no hepato splenomegal no CVA tenderness.  1-2 fb outie umbil hernia? No masses  noted  Extremtities:  No clubbing cyanosis or edema, no acute joint swelling or redness no focal atrophy  djd changes hands  good rom no redness or warmth NEURO:  Oriented x3, cranial nerves 3-12 appear to be intact, no obvious focal weakness,gait within normal limits no abnormal reflexes or asymmetrical SKIN: No acute rashes normal turgor, color, no bruising or petechiae. Few senil ecchymosis on forarms  PSYCH: Oriented, good eye contact, no obvious depression anxiety, cognition and judgment appear normal. LN: no cervical axillary inguinal adenopathy    BP Readings from Last 3 Encounters:  03/18/21 104/60  09/11/20 120/60  03/11/20 118/70    Lab fasting today   ASSESSMENT AND PLAN:  Discussed the following assessment and plan:    ICD-10-CM   1. Visit for preventive health examination  Z00.00     2. History of breast cancer  0000000 Basic metabolic panel    CBC with Differential/Platelet    Hemoglobin A1c    Hepatic function panel    Lipid panel    TSH    TSH    Lipid panel    Hepatic function panel    Hemoglobin A1c    CBC with Differential/Platelet    Basic metabolic panel    3. Medication management  [id number] Basic metabolic panel    CBC with Differential/Platelet    Hemoglobin A1c    Hepatic function panel    Lipid panel     TSH    TSH    Lipid panel    Hepatic function panel    Hemoglobin A1c    CBC with Differential/Platelet    Basic metabolic panel    4. Essential hypertension  [id number] Basic metabolic panel    CBC with Differential/Platelet    Hemoglobin A1c    Hepatic function panel    Lipid panel    TSH    TSH    Lipid panel    Hepatic function panel    Hemoglobin A1c    CBC with Differential/Platelet    Basic metabolic panel    5. Fasting hyperglycemia  [id number] Basic metabolic panel    CBC with Differential/Platelet    Hemoglobin A1c    Hepatic function panel    Lipid panel    TSH    TSH    Lipid panel    Hepatic function panel    Hemoglobin A1c    CBC with Differential/Platelet    Basic metabolic panel    6. H/O left mastectomy  [id number] Basic metabolic panel    CBC with Differential/Platelet    Hemoglobin A1c    Hepatic function panel    Lipid panel    TSH    TSH    Lipid panel    Hepatic function panel    Hemoglobin A1c    CBC with Differential/Platelet    Basic metabolic panel    7. Hyperlipidemia, unspecified hyperlipidemia type  [id number] Basic metabolic panel    CBC with Differential/Platelet    Hemoglobin A1c    Hepatic function panel    Lipid panel    TSH  TSH    Lipid panel    Hepatic function panel    Hemoglobin A1c    CBC with Differential/Platelet    Basic metabolic panel    8. Decreased hearing of both ears  H91.93     To consider decreasing  amlodipine dose if  getting lower BP  .  Return in about 6 months (around 09/18/2021) for depending on results.  Patient Care Team: Dennys Guin, Standley Brooking, MD as PCP - Patric Dykes, MD (Urology) Patient Instructions  Good to see  you today .  Blood pressure is good    But if you are getting  low blood pressure  symptoms such as light headedness and Bp  100 range and below would decrease dose  of amlodipine to 2.5 mg per day .   Still think you should look into getting hearing assistance .    Health  Maintenance, Female Adopting a healthy lifestyle and getting preventive care are important in promoting health and wellness. Ask your health care provider about: The right schedule for you to have regular tests and exams. Things you can do on your own to prevent diseases and keep yourself healthy. What should I know about diet, weight, and exercise? Eat a healthy diet  Eat a diet that includes plenty of vegetables, fruits, low-fat dairy products, and lean protein. Do not eat a lot of foods that are high in solid fats, added sugars, or sodium.  Maintain a healthy weight Body mass index (BMI) is used to identify weight problems. It estimates body fat based on height and weight. Your health care provider can help determineyour BMI and help you achieve or maintain a healthy weight. Get regular exercise Get regular exercise. This is one of the most important things you can do for your health. Most adults should: Exercise for at least 150 minutes each week. The exercise should increase your heart rate and make you sweat (moderate-intensity exercise). Do strengthening exercises at least twice a week. This is in addition to the moderate-intensity exercise. Spend less time sitting. Even light physical activity can be beneficial. Watch cholesterol and blood lipids Have your blood tested for lipids and cholesterol at 85 years of age, then havethis test every 5 years. Have your cholesterol levels checked more often if: Your lipid or cholesterol levels are high. You are older than 85 years of age. You are at high risk for heart disease. What should I know about cancer screening? Depending on your health history and family history, you may need to have cancer screening at various ages. This may include screening for: Breast cancer. Cervical cancer. Colorectal cancer. Skin cancer. Lung cancer. What should I know about heart disease, diabetes, and high blood pressure? Blood pressure and heart  disease High blood pressure causes heart disease and increases the risk of stroke. This is more likely to develop in people who have high blood pressure readings, are of African descent, or are overweight. Have your blood pressure checked: Every 3-5 years if you are 83-41 years of age. Every year if you are 23 years old or older. Diabetes Have regular diabetes screenings. This checks your fasting blood sugar level. Have the screening done: Once every three years after age 30 if you are at a normal weight and have a low risk for diabetes. More often and at a younger age if you are overweight or have a high risk for diabetes. What should I know about preventing infection? Hepatitis B If you have a higher  risk for hepatitis B, you should be screened for this virus. Talk with your health care provider to find out if you are at risk forhepatitis B infection. Hepatitis C Testing is recommended for: Everyone born from [birth year] through 1963. Anyone with known risk factors for hepatitis C. Sexually transmitted infections (STIs) Get screened for STIs, including gonorrhea and chlamydia, if: You are sexually active and are younger than 85 years of age. You are older than 85 years of age and your health care provider tells you that you are at risk for this type of infection. Your sexual activity has changed since you were last screened, and you are at increased risk for chlamydia or gonorrhea. Ask your health care provider if you are at risk. Ask your health care provider about whether you are at high risk for HIV. Your health care provider may recommend a prescription medicine to help prevent HIV infection. If you choose to take medicine to prevent HIV, you should first get tested for HIV. You should then be tested every 3 months for as long as you are taking the medicine. Pregnancy If you are about to stop having your period (premenopausal) and you may become pregnant, seek counseling before you get  pregnant. Take 400 to 800 micrograms (mcg) of folic acid every day if you become pregnant. Ask for birth control (contraception) if you want to prevent pregnancy. Osteoporosis and menopause Osteoporosis is a disease in which the bones lose minerals and strength with aging. This can result in bone fractures. If you are 33 years old or older, or if you are at risk for osteoporosis and fractures, ask your health care provider if you should: Be screened for bone loss. Take a calcium or vitamin D supplement to lower your risk of fractures. Be given hormone replacement therapy (HRT) to treat symptoms of menopause. Follow these instructions at home: Lifestyle Do not use any products that contain nicotine or tobacco, such as cigarettes, e-cigarettes, and chewing tobacco. If you need help quitting, ask your health care provider. Do not use street drugs. Do not share needles. Ask your health care provider for help if you need support or information about quitting drugs. Alcohol use Do not drink alcohol if: Your health care provider tells you not to drink. You are pregnant, may be pregnant, or are planning to become pregnant. If you drink alcohol: Limit how much you use to 0-1 drink a day. Limit intake if you are breastfeeding. Be aware of how much alcohol is in your drink. In the [place] one drink equals one 12 oz bottle of beer (355 mL), one 5 oz glass of wine (148 mL), or one 1 oz glass of hard liquor (44 mL). General instructions Schedule regular health, dental, and eye exams. Stay current with your vaccines. Tell your health care provider if: You often feel depressed. You have ever been abused or do not feel safe at home. Summary Adopting a healthy lifestyle and getting preventive care are important in promoting health and wellness. Follow your health care provider's instructions about healthy diet, exercising, and getting tested or screened for diseases. Follow your health care provider's  instructions on monitoring your cholesterol and blood pressure. This information is not intended to replace advice given to you by your health care provider. Make sure you discuss any questions you have with your healthcare provider. Document Revised: 07/13/2018 Document Reviewed: 07/13/2018 Elsevier Patient Education  2022 [employer]. Britney Newstrom M.D.

## 2021-03-18 ENCOUNTER — Encounter: Payer: Self-pay | Admitting: Internal Medicine

## 2021-03-18 ENCOUNTER — Other Ambulatory Visit: Payer: Self-pay

## 2021-03-18 ENCOUNTER — Ambulatory Visit (INDEPENDENT_AMBULATORY_CARE_PROVIDER_SITE_OTHER): Payer: Medicare Other | Admitting: Internal Medicine

## 2021-03-18 VITALS — BP 104/60 | HR 75 | Temp 98.1°F | Ht 62.75 in | Wt 120.0 lb

## 2021-03-18 DIAGNOSIS — Z853 Personal history of malignant neoplasm of breast: Secondary | ICD-10-CM | POA: Diagnosis not present

## 2021-03-18 DIAGNOSIS — H9193 Unspecified hearing loss, bilateral: Secondary | ICD-10-CM

## 2021-03-18 DIAGNOSIS — Z0000 Encounter for general adult medical examination without abnormal findings: Secondary | ICD-10-CM

## 2021-03-18 DIAGNOSIS — Z79899 Other long term (current) drug therapy: Secondary | ICD-10-CM | POA: Diagnosis not present

## 2021-03-18 DIAGNOSIS — R7301 Impaired fasting glucose: Secondary | ICD-10-CM

## 2021-03-18 DIAGNOSIS — I10 Essential (primary) hypertension: Secondary | ICD-10-CM | POA: Diagnosis not present

## 2021-03-18 DIAGNOSIS — Z9012 Acquired absence of left breast and nipple: Secondary | ICD-10-CM

## 2021-03-18 DIAGNOSIS — E785 Hyperlipidemia, unspecified: Secondary | ICD-10-CM

## 2021-03-18 LAB — CBC WITH DIFFERENTIAL/PLATELET
Basophils Absolute: 0 10*3/uL (ref 0.0–0.1)
Basophils Relative: 0.7 % (ref 0.0–3.0)
Eosinophils Absolute: 0.1 10*3/uL (ref 0.0–0.7)
Eosinophils Relative: 1.7 % (ref 0.0–5.0)
HCT: 42.2 % (ref 36.0–46.0)
Hemoglobin: 14.1 g/dL (ref 12.0–15.0)
Lymphocytes Relative: 23.8 % (ref 12.0–46.0)
Lymphs Abs: 1.4 10*3/uL (ref 0.7–4.0)
MCHC: 33.4 g/dL (ref 30.0–36.0)
MCV: 94.4 fl (ref 78.0–100.0)
Monocytes Absolute: 0.5 10*3/uL (ref 0.1–1.0)
Monocytes Relative: 9 % (ref 3.0–12.0)
Neutro Abs: 3.9 10*3/uL (ref 1.4–7.7)
Neutrophils Relative %: 64.8 % (ref 43.0–77.0)
Platelets: 155 10*3/uL (ref 150.0–400.0)
RBC: 4.48 Mil/uL (ref 3.87–5.11)
RDW: 13.2 % (ref 11.5–15.5)
WBC: 6 10*3/uL (ref 4.0–10.5)

## 2021-03-18 LAB — BASIC METABOLIC PANEL
BUN: 19 mg/dL (ref 6–23)
CO2: 25 mEq/L (ref 19–32)
Calcium: 9.2 mg/dL (ref 8.4–10.5)
Chloride: 106 mEq/L (ref 96–112)
Creatinine, Ser: 0.92 mg/dL (ref 0.40–1.20)
GFR: 56 mL/min — ABNORMAL LOW (ref >60.00)
Glucose, Bld: 96 mg/dL (ref 70–99)
Potassium: 4.2 mEq/L (ref 3.5–5.1)
Sodium: 141 mEq/L (ref 135–145)

## 2021-03-18 LAB — HEPATIC FUNCTION PANEL
ALT: 26 U/L (ref 0–35)
AST: 23 U/L (ref 0–37)
Albumin: 4.4 g/dL (ref 3.5–5.2)
Alkaline Phosphatase: 67 U/L (ref 39–117)
Bilirubin, Direct: 0.2 mg/dL (ref 0.0–0.3)
Total Bilirubin: 0.8 mg/dL (ref 0.2–1.2)
Total Protein: 6.7 g/dL (ref 6.0–8.3)

## 2021-03-18 LAB — LIPID PANEL
Cholesterol: 213 mg/dL — ABNORMAL HIGH (ref 0–200)
HDL: 56.1 mg/dL (ref >39.00)
LDL Cholesterol: 129 mg/dL — ABNORMAL HIGH (ref 0–99)
NonHDL: 157.31
Total CHOL/HDL Ratio: 4
Triglycerides: 143 mg/dL (ref 0.0–149.0)
VLDL: 28.6 mg/dL (ref 0.0–40.0)

## 2021-03-18 LAB — TSH: TSH: 1.9 u[IU]/mL (ref 0.35–5.50)

## 2021-03-18 LAB — HEMOGLOBIN A1C: Hgb A1c MFr Bld: 6 % (ref 4.6–6.5)

## 2021-03-18 NOTE — Progress Notes (Signed)
Blood sugar is borderline but no concern thyroid normal.  [name] [name] all stable slightly improved from last year, liver tests normal kidney function stable. No change continue healthy eating as possible. Make follow-up visit in 6 months we will recheck her sugar.  At the visit

## 2021-03-18 NOTE — Patient Instructions (Addendum)
Good to see  you today .  Blood pressure is good    But if you are getting  low blood pressure  symptoms such as light headedness and Bp  100 range and below would decrease dose  of amlodipine to 2.5 mg per day .   Still think you should look into getting hearing assistance .    Health Maintenance, Female Adopting a healthy lifestyle and getting preventive care are important in promoting health and wellness. Ask your health care provider about: The right schedule for you to have regular tests and exams. Things you can do on your own to prevent diseases and keep yourself healthy. What should I know about diet, weight, and exercise? Eat a healthy diet  Eat a diet that includes plenty of vegetables, fruits, low-fat dairy products, and lean protein. Do not eat a lot of foods that are high in solid fats, added sugars, or sodium.  Maintain a healthy weight Body mass index (BMI) is used to identify weight problems. It estimates body fat based on height and weight. Your health care provider can help determineyour BMI and help you achieve or maintain a healthy weight. Get regular exercise Get regular exercise. This is one of the most important things you can do for your health. Most adults should: Exercise for at least 150 minutes each week. The exercise should increase your heart rate and make you sweat (moderate-intensity exercise). Do strengthening exercises at least twice a week. This is in addition to the moderate-intensity exercise. Spend less time sitting. Even light physical activity can be beneficial. Watch cholesterol and blood lipids Have your blood tested for lipids and cholesterol at 85 years of age, then havethis test every 5 years. Have your cholesterol levels checked more often if: Your lipid or cholesterol levels are high. You are older than 85 years of age. You are at high risk for heart disease. What should I know about cancer screening? Depending on your health history and  family history, you may need to have cancer screening at various ages. This may include screening for: Breast cancer. Cervical cancer. Colorectal cancer. Skin cancer. Lung cancer. What should I know about heart disease, diabetes, and high blood pressure? Blood pressure and heart disease High blood pressure causes heart disease and increases the risk of stroke. This is more likely to develop in people who have high blood pressure readings, are of African descent, or are overweight. Have your blood pressure checked: Every 3-5 years if you are 4-8 years of age. Every year if you are 85 years old or older. Diabetes Have regular diabetes screenings. This checks your fasting blood sugar level. Have the screening done: Once every three years after age 36 if you are at a normal weight and have a low risk for diabetes. More often and at a younger age if you are overweight or have a high risk for diabetes. What should I know about preventing infection? Hepatitis B If you have a higher risk for hepatitis B, you should be screened for this virus. Talk with your health care provider to find out if you are at risk forhepatitis B infection. Hepatitis C Testing is recommended for: Everyone born from [birth year] through 1965. Anyone with known risk factors for hepatitis C. Sexually transmitted infections (STIs) Get screened for STIs, including gonorrhea and chlamydia, if: You are sexually active and are younger than 85 years of age. You are older than 85 years of age and your health care provider tells you that  you are at risk for this type of infection. Your sexual activity has changed since you were last screened, and you are at increased risk for chlamydia or gonorrhea. Ask your health care provider if you are at risk. Ask your health care provider about whether you are at high risk for HIV. Your health care provider may recommend a prescription medicine to help prevent HIV infection. If you choose to take  medicine to prevent HIV, you should first get tested for HIV. You should then be tested every 3 months for as long as you are taking the medicine. Pregnancy If you are about to stop having your period (premenopausal) and you may become pregnant, seek counseling before you get pregnant. Take 400 to 800 micrograms (mcg) of folic acid every day if you become pregnant. Ask for birth control (contraception) if you want to prevent pregnancy. Osteoporosis and menopause Osteoporosis is a disease in which the bones lose minerals and strength with aging. This can result in bone fractures. If you are 11 years old or older, or if you are at risk for osteoporosis and fractures, ask your health care provider if you should: Be screened for bone loss. Take a calcium or vitamin D supplement to lower your risk of fractures. Be given hormone replacement therapy (HRT) to treat symptoms of menopause. Follow these instructions at home: Lifestyle Do not use any products that contain nicotine or tobacco, such as cigarettes, e-cigarettes, and chewing tobacco. If you need help quitting, ask your health care provider. Do not use street drugs. Do not share needles. Ask your health care provider for help if you need support or information about quitting drugs. Alcohol use Do not drink alcohol if: Your health care provider tells you not to drink. You are pregnant, may be pregnant, or are planning to become pregnant. If you drink alcohol: Limit how much you use to 0-1 drink a day. Limit intake if you are breastfeeding. Be aware of how much alcohol is in your drink. In the [place] one drink equals one 12 oz bottle of beer (355 mL), one 5 oz glass of wine (148 mL), or one 1 oz glass of hard liquor (44 mL). General instructions Schedule regular health, dental, and eye exams. Stay current with your vaccines. Tell your health care provider if: You often feel depressed. You have ever been abused or do not feel safe at  home. Summary Adopting a healthy lifestyle and getting preventive care are important in promoting health and wellness. Follow your health care provider's instructions about healthy diet, exercising, and getting tested or screened for diseases. Follow your health care provider's instructions on monitoring your cholesterol and blood pressure. This information is not intended to replace advice given to you by your health care provider. Make sure you discuss any questions you have with your healthcare provider. Document Revised: 07/13/2018 Document Reviewed: 07/13/2018 Elsevier Patient Education  2022 [employer].

## 2021-04-29 ENCOUNTER — Other Ambulatory Visit: Payer: Self-pay

## 2021-04-29 ENCOUNTER — Ambulatory Visit (INDEPENDENT_AMBULATORY_CARE_PROVIDER_SITE_OTHER): Payer: Medicare Other

## 2021-04-29 DIAGNOSIS — Z23 Encounter for immunization: Secondary | ICD-10-CM | POA: Diagnosis not present

## 2021-04-30 ENCOUNTER — Other Ambulatory Visit: Payer: Self-pay | Admitting: Internal Medicine

## 2021-05-16 DIAGNOSIS — H2513 Age-related nuclear cataract, bilateral: Secondary | ICD-10-CM | POA: Diagnosis not present

## 2021-07-30 ENCOUNTER — Other Ambulatory Visit: Payer: Self-pay | Admitting: Internal Medicine

## 2021-07-30 DIAGNOSIS — Z1231 Encounter for screening mammogram for malignant neoplasm of breast: Secondary | ICD-10-CM

## 2021-08-22 ENCOUNTER — Ambulatory Visit
Admission: RE | Admit: 2021-08-22 | Discharge: 2021-08-22 | Disposition: A | Discharge status: Home / Self Care | Payer: Medicare Other | Source: Ambulatory Visit | Attending: Internal Medicine | Admitting: Internal Medicine

## 2021-08-22 DIAGNOSIS — Z1231 Encounter for screening mammogram for malignant neoplasm of breast: Secondary | ICD-10-CM

## 2021-08-25 ENCOUNTER — Other Ambulatory Visit: Payer: Self-pay | Admitting: Internal Medicine

## 2021-08-25 DIAGNOSIS — R928 Other abnormal and inconclusive findings on diagnostic imaging of breast: Secondary | ICD-10-CM

## 2021-09-03 ENCOUNTER — Ambulatory Visit
Admission: RE | Admit: 2021-09-03 | Discharge: 2021-09-03 | Disposition: A | Discharge status: Home / Self Care | Payer: Medicare Other | Source: Ambulatory Visit | Attending: Internal Medicine | Admitting: Internal Medicine

## 2021-09-03 ENCOUNTER — Other Ambulatory Visit: Payer: Self-pay | Admitting: Internal Medicine

## 2021-09-03 DIAGNOSIS — R922 Inconclusive mammogram: Secondary | ICD-10-CM | POA: Diagnosis not present

## 2021-09-03 DIAGNOSIS — R928 Other abnormal and inconclusive findings on diagnostic imaging of breast: Secondary | ICD-10-CM

## 2021-09-17 ENCOUNTER — Other Ambulatory Visit: Payer: Self-pay

## 2021-09-17 ENCOUNTER — Other Ambulatory Visit: Payer: Self-pay | Admitting: Internal Medicine

## 2021-09-17 ENCOUNTER — Ambulatory Visit
Admission: RE | Admit: 2021-09-17 | Discharge: 2021-09-17 | Disposition: A | Discharge status: Home / Self Care | Payer: Medicare Other | Source: Ambulatory Visit | Attending: Internal Medicine | Admitting: Internal Medicine

## 2021-09-17 ENCOUNTER — Other Ambulatory Visit: Payer: Medicare Other

## 2021-09-17 DIAGNOSIS — C50211 Malignant neoplasm of upper-inner quadrant of right female breast: Secondary | ICD-10-CM | POA: Diagnosis not present

## 2021-09-17 DIAGNOSIS — R928 Other abnormal and inconclusive findings on diagnostic imaging of breast: Secondary | ICD-10-CM

## 2021-09-17 DIAGNOSIS — N6312 Unspecified lump in the right breast, upper inner quadrant: Secondary | ICD-10-CM | POA: Diagnosis not present

## 2021-09-17 HISTORY — PX: BREAST BIOPSY: SHX20

## 2021-09-18 ENCOUNTER — Encounter: Payer: Self-pay | Admitting: Internal Medicine

## 2021-09-18 ENCOUNTER — Ambulatory Visit (INDEPENDENT_AMBULATORY_CARE_PROVIDER_SITE_OTHER): Payer: Medicare Other | Admitting: Internal Medicine

## 2021-09-18 ENCOUNTER — Telehealth: Payer: Self-pay

## 2021-09-18 DIAGNOSIS — Z9012 Acquired absence of left breast and nipple: Secondary | ICD-10-CM

## 2021-09-18 DIAGNOSIS — C50911 Malignant neoplasm of unspecified site of right female breast: Secondary | ICD-10-CM | POA: Diagnosis not present

## 2021-09-18 DIAGNOSIS — Z853 Personal history of malignant neoplasm of breast: Secondary | ICD-10-CM | POA: Diagnosis not present

## 2021-09-18 NOTE — Telephone Encounter (Signed)
Pt had a right breast biposy on 09/17/21. showed Grade 2 invasive mammary carcinoma.   Pt has seen results on mychart. Declined surgical referral until speaking to Dr Regis Bill.  Manuela Schwartz has offered to do [place] referral Water quality scientist at [employer]) very quickly; call her to let her know. [phone]

## 2021-09-18 NOTE — Telephone Encounter (Signed)
I spoke with the pt and she stated she does not want to start chemotherapy treatment or to be seen bo Oncologist. Pt stated that she would like a call from PCP to discuss.

## 2021-09-18 NOTE — Telephone Encounter (Signed)
I spoke with Manuela Schwartz with [facility] and informed her that pt is willing to proceed with referral. Manuela Schwartz stated the referral has been placed and someone should be reaching out to pt sometime today.

## 2021-09-18 NOTE — Progress Notes (Signed)
° °  Virtual Visit via Telephone Note  I connected with@ on 09/18/21 at 11:30 AM EST by telephone and verified that I am speaking with the correct person using two identifiers.   I discussed the limitations, risks, security and privacy concerns of performing an evaluation and management service by telephone and the limited availability of in person appointments. tThere may be a patient responsible charge related to this service. The patient expressed understanding and agreed to proceed.  Location patient: home Location provider: home office Participants present for the call: patient, provider Patient did not have a visit in the prior 7 days to address this/these issue(s).   History of Present Illness: [name] [name] as an add-on telephone visit because she just got notified that her biopsy of her right breast showed stage II invasive breast cancer and was recommended for a referral however she declined it because did not want to get treatment or did not think she would tolerate treatment but wanted to talk to me first.  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 has friends who have had chemotherapy and did radiation and did not do well She had a full mastectomy over 30 years ago.  Was told that last year her mammogram which ended up being a diagnostic and ultrasound was normal and there was no cancer.  Or suspicious areas.     Observations/Objective: Patient sounds personable and well on the phone. I do not appreciate any SOB. Speech and thought processing are grossly intact. Patient reported vitals:   Assessment and Plan: Malignant neoplasm of right female breast, unspecified estrogen receptor status, unspecified site of breast ([place])  History of breast cancer  H/O left mastectomy  Although understandably is hesitant to do major treatment and intervention she is pretty well and advised her to follow through with referrals to get information ask her questions of  what is possible what her options are prognosis as possible before she makes any decisions. She will contact the nurse navigator for the referral. Follow Up Instructions:  Let us know how we can help   agree with further evaluation and or opinion.  Yes can get second shingles vaccine.    [id number] [id number] [id number] [id number] [id number] 21-30 I did not refer this patient for an OV in the next 24 hours for this/these issue(s).  I discussed the assessment and treatment plan with the patient. The patient was provided an opportunity to ask questions and answered. The patient agreed with the plan and demonstrated an understanding of the instructions.   The patient was advised to call back or seek an in-person evaluation if the symptoms worsen or if the condition fails to improve as anticipated.  I provided 13 minutes of non-face-to-face time during this encounter. Return for as indicated when due and as needed .  Shanon Ace, MD

## 2021-09-18 NOTE — Telephone Encounter (Signed)
Sorry she had a positive biopsy .  BUt I agree with referral   tell her  that  surgery consult doesn't mean necessarily surgery but  opinion about  best option for treatment  depending on  type of  tumor .  We can do a add on telephone visit if feels needed now .

## 2021-09-18 NOTE — Telephone Encounter (Signed)
I spoke spoke with patient in telephone visit advised her to proceed with a referral to get more information before she makes a decision about any intervention.

## 2021-09-23 ENCOUNTER — Encounter: Payer: Medicare Other | Admitting: Internal Medicine

## 2021-09-23 ENCOUNTER — Ambulatory Visit (INDEPENDENT_AMBULATORY_CARE_PROVIDER_SITE_OTHER): Payer: Medicare Other

## 2021-09-23 VITALS — BP 110/64 | HR 72 | Temp 98.2°F | Ht 62.5 in | Wt 121.2 lb

## 2021-09-23 DIAGNOSIS — Z0000 Encounter for general adult medical examination without abnormal findings: Secondary | ICD-10-CM | POA: Diagnosis not present

## 2021-09-23 NOTE — Patient Instructions (Signed)
[name] [name] , Thank you for taking time to come for your Medicare Wellness Visit. I appreciate your ongoing commitment to your health goals. Please review the following plan we discussed and let me know if I can assist you in the future.   Screening recommendations/referrals: Colonoscopy: not required Mammogram: completed 08/22/2021 Bone Density: decline Recommended yearly ophthalmology/optometry visit for glaucoma screening and checkup Recommended yearly dental visit for hygiene and checkup  Vaccinations: Influenza vaccine: completed 04/29/2021, due next flu season Pneumococcal vaccine: completed 10/27/2013 Tdap vaccine: completed 08/02/2017, due 08/03/2027 Shingles vaccine: needs second dose   Covid-19: 06/03/2021, 12/09/2020, 09/29/2019, 09/08/2019  Advanced directives: copy in chart  Conditions/risks identified: none  Next appointment: Follow up in one year for your annual wellness visit    Preventive Care 65 Years and Older, Female Preventive care refers to lifestyle choices and visits with your health care provider that can promote health and wellness. What does preventive care include? A yearly physical exam. This is also called an annual well check. Dental exams once or twice a year. Routine eye exams. Ask your health care provider how often you should have your eyes checked. Personal lifestyle choices, including: Daily care of your teeth and gums. Regular physical activity. Eating a healthy diet. Avoiding tobacco and drug use. Limiting alcohol use. Practicing safe sex. Taking low-dose aspirin every day. Taking vitamin and mineral supplements as recommended by your health care provider. What happens during an annual well check? The services and screenings done by your health care provider during your annual well check will depend on your age, overall health, lifestyle risk factors, and family history of disease. Counseling  Your health care provider may ask you questions about  your: Alcohol use. Tobacco use. Drug use. Emotional well-being. Home and relationship well-being. Sexual activity. Eating habits. History of falls. Memory and ability to understand (cognition). Work and work Statistician. Reproductive health. Screening  You may have the following tests or measurements: Height, weight, and BMI. Blood pressure. Lipid and cholesterol levels. These may be checked every 5 years, or more frequently if you are over 75 years old. Skin check. Lung cancer screening. You may have this screening every year starting at age 61 if you have a 30-pack-year history of smoking and currently smoke or have quit within the past 15 years. Fecal occult blood test (FOBT) of the stool. You may have this test every year starting at age 76. Flexible sigmoidoscopy or colonoscopy. You may have a sigmoidoscopy every 5 years or a colonoscopy every 10 years starting at age 76. Hepatitis C blood test. Hepatitis B blood test. Sexually transmitted disease (STD) testing. Diabetes screening. This is done by checking your blood sugar (glucose) after you have not eaten for a while (fasting). You may have this done every 1-3 years. Bone density scan. This is done to screen for osteoporosis. You may have this done starting at age 90. Mammogram. This may be done every 1-2 years. Talk to your health care provider about how often you should have regular mammograms. Talk with your health care provider about your test results, treatment options, and if necessary, the need for more tests. Vaccines  Your health care provider may recommend certain vaccines, such as: Influenza vaccine. This is recommended every year. Tetanus, diphtheria, and acellular pertussis (Tdap, Td) vaccine. You may need a Td booster every 10 years. Zoster vaccine. You may need this after age 18. Pneumococcal 13-valent conjugate (PCV13) vaccine. One dose is recommended after age 69. Pneumococcal polysaccharide (PPSV23) vaccine.  One dose is recommended after age 2. Talk to your health care provider about which screenings and vaccines you need and how often you need them. This information is not intended to replace advice given to you by your health care provider. Make sure you discuss any questions you have with your health care provider. Document Released: 08/16/2015 Document Revised: 04/08/2016 Document Reviewed: 05/21/2015 Elsevier Interactive Patient Education  2017 [employer] Prevention in the Home Falls can cause injuries. They can happen to people of all ages. There are many things you can do to make your home safe and to help prevent falls. What can I do on the outside of my home? Regularly fix the edges of walkways and driveways and fix any cracks. Remove anything that might make you trip as you walk through a door, such as a raised step or threshold. Trim any bushes or trees on the path to your home. Use bright outdoor lighting. Clear any walking paths of anything that might make someone trip, such as rocks or tools. Regularly check to see if handrails are loose or broken. Make sure that both sides of any steps have handrails. Any raised decks and porches should have guardrails on the edges. Have any leaves, snow, or ice cleared regularly. Use sand or salt on walking paths during winter. Clean up any spills in your garage right away. This includes oil or grease spills. What can I do in the bathroom? Use night lights. Install grab bars by the toilet and in the tub and shower. Do not use towel bars as grab bars. Use non-skid mats or decals in the tub or shower. If you need to sit down in the shower, use a plastic, non-slip stool. Keep the floor dry. Clean up any water that spills on the floor as soon as it happens. Remove soap buildup in the tub or shower regularly. Attach bath mats securely with double-sided non-slip rug tape. Do not have throw rugs and other things on the floor that can make  you trip. What can I do in the bedroom? Use night lights. Make sure that you have a light by your bed that is easy to reach. Do not use any sheets or blankets that are too big for your bed. They should not hang down onto the floor. Have a firm chair that has side arms. You can use this for support while you get dressed. Do not have throw rugs and other things on the floor that can make you trip. What can I do in the kitchen? Clean up any spills right away. Avoid walking on wet floors. Keep items that you use a lot in easy-to-reach places. If you need to reach something above you, use a strong step stool that has a grab bar. Keep electrical cords out of the way. Do not use floor polish or wax that makes floors slippery. If you must use wax, use non-skid floor wax. Do not have throw rugs and other things on the floor that can make you trip. What can I do with my stairs? Do not leave any items on the stairs. Make sure that there are handrails on both sides of the stairs and use them. Fix handrails that are broken or loose. Make sure that handrails are as long as the stairways. Check any carpeting to make sure that it is firmly attached to the stairs. Fix any carpet that is loose or worn. Avoid having throw rugs at the top or bottom of the  stairs. If you do have throw rugs, attach them to the floor with carpet tape. Make sure that you have a light switch at the top of the stairs and the bottom of the stairs. If you do not have them, ask someone to add them for you. What else can I do to help prevent falls? Wear shoes that: Do not have high heels. Have rubber bottoms. Are comfortable and fit you well. Are closed at the toe. Do not wear sandals. If you use a stepladder: Make sure that it is fully opened. Do not climb a closed stepladder. Make sure that both sides of the stepladder are locked into place. Ask someone to hold it for you, if possible. Clearly mark and make sure that you can  see: Any grab bars or handrails. First and last steps. Where the edge of each step is. Use tools that help you move around (mobility aids) if they are needed. These include: Canes. Walkers. Scooters. Crutches. Turn on the lights when you go into a dark area. Replace any light bulbs as soon as they burn out. Set up your furniture so you have a clear path. Avoid moving your furniture around. If any of your floors are uneven, fix them. If there are any pets around you, be aware of where they are. Review your medicines with your doctor. Some medicines can make you feel dizzy. This can increase your chance of falling. Ask your doctor what other things that you can do to help prevent falls. This information is not intended to replace advice given to you by your health care provider. Make sure you discuss any questions you have with your health care provider. Document Released: 05/16/2009 Document Revised: 12/26/2015 Document Reviewed: 08/24/2014 Elsevier Interactive Patient Education  2017 [employer].

## 2021-09-23 NOTE — Progress Notes (Signed)
This visit occurred during the SARS-CoV-2 public health emergency.  Safety protocols were in place, including screening questions prior to the visit, additional usage of staff PPE, and extensive cleaning of exam room while observing appropriate contact time as indicated for disinfecting solutions.  Subjective:   [name] [name] is a 88 y.o. female who presents for Medicare Annual (Subsequent) preventive examination.  Review of Systems     Cardiac Risk Factors include: advanced age (>58men, >69 women);dyslipidemia;hypertension     Objective:    Today's Vitals   09/23/21 1505  BP: 110/64  Pulse: 72  Temp: 98.2 F (36.8 C)  TempSrc: Oral  Weight: 121 lb 3.2 oz (55 kg)  Height: 5' 2.5" (1.588 m)   Body mass index is 21.81 kg/m.  Advanced Directives 09/23/2021 09/19/2019 08/31/2018 12/17/2017 07/20/2017 11/05/2014  Does Patient Have a Medical Advance Directive? Yes Yes Yes Yes Yes Yes  Type of Advance Directive - - Living will;Healthcare Power of Attorney Out of facility DNR (pink MOST or yellow form) - -  Does patient want to make changes to medical advance directive? - No - Patient declined No - Patient declined No - Patient declined - -  Copy of [place] in Chart? No - copy requested - No - copy requested - - -    Current Medications (verified) Outpatient Encounter Medications as of 09/23/2021  Medication Sig   amLODipine (NORVASC) 5 MG tablet Take 1 tablet by mouth once daily   Ascorbic Acid (VITAMIN C) 500 MG tablet Take 500 mg by mouth 2 (two) times daily.    Calcium Carb-Cholecalciferol 600-800 MG-UNIT TABS Take 1 tablet by mouth daily.   loratadine (CLARITIN) 10 MG tablet Take 10 mg by mouth daily as needed for allergies.   Multiple Vitamins-Minerals (CENTRUM SILVER PO) Take 1 tablet by mouth daily.   Vitamin D, Cholecalciferol, 1000 UNITS TABS Take 1,000 Units by mouth daily.    CINNAMON PO Take 1,000 mg by mouth 2 (two) times daily.  (Patient not taking:  Reported on 09/23/2021)   doxylamine, Sleep, (UNISOM) 25 MG tablet Take 25 mg by mouth at bedtime as needed for sleep. (Patient not taking: Reported on 09/23/2021)   fish oil-omega-3 fatty acids 1000 MG capsule Take 1 g by mouth 2 (two) times daily. (Patient not taking: Reported on 09/23/2021)   Magnesium 400 MG TABS Take 400 mg by mouth daily. (Patient not taking: Reported on 09/23/2021)   No facility-administered encounter medications on file as of 09/23/2021.    Allergies (verified) Nitrofurantoin and Sulfamethoxazole   History: Past Medical History:  Diagnosis Date   Adenomatous colon polyp     4 2008   Breast cancer ([place]) 1978   left breast   Complication of anesthesia    Cystitis, interstitial    Fracture    left ankle and rt wrist   FRACTURE, ANKLE, LEFT 12/14/2006   Qualifier: Diagnosis of  By: Hulan Saas, CMA (AAMA), Shannon S    FRACTURE, WRIST 12/14/2006   Qualifier: Diagnosis of  By: Hulan Saas, CMA (AAMA), Quita Skye    Hyperlipidemia    Hypertension    Osteoarthritis    PONV (postoperative nausea and vomiting)    Episode after Ether   Rotator cuff tear, right    Shingles    Urinary incontinence    Past Surgical History:  Procedure Laterality Date   ABDOMINAL HYSTERECTOMY  1988   fibroids   DILATION AND CURETTAGE OF UTERUS     x 2  MASTECTOMY, PARTIAL  1978   left   SHOULDER ARTHROSCOPY WITH DISTAL CLAVICLE RESECTION Right 12/23/2017   Procedure: SHOULDER ARTHROSCOPY WITH DISTAL CLAVICLE RESECTION;  Surgeon: Justice Britain, MD;  Location: [facility];  Service: Orthopedics;  Laterality: Right;   SHOULDER ARTHROSCOPY WITH ROTATOR CUFF REPAIR AND SUBACROMIAL DECOMPRESSION Right 12/23/2017   Procedure: SHOULDER ARTHROSCOPY WITH ROTATOR CUFF REPAIR AND SUBACROMIAL DECOMPRESSION, distal clavicle resection;  Surgeon: Justice Britain, MD;  Location: [facility];  Service: Orthopedics;  Laterality: Right;   TONSILLECTOMY     TUBAL LIGATION  73   Family History  Problem Relation Age of  Onset   Suicidality Mother    Heart attack Father    Cancer - Other Daughter        throat cancer    Thyroid disease Neg Hx    Social History   Socioeconomic History   Marital status: Married    Spouse name: Not on file   Number of children: 3   Years of education: Not on file   Highest education level: Not on file  Occupational History    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  Tobacco Use   Smoking status: Former   Smokeless tobacco: Never   Tobacco comments:    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 not interested in caregiver or respite support   Enjoys reading, walking around the house.   Social Determinants of Health   Financial Resource Strain: Low Risk    Difficulty of Paying Living Expenses: Not hard at all  Food Insecurity: No Food Insecurity   Worried About Charity fundraiser in the Last Year: Never true   [place] in the Last Year: Never true  Transportation Needs: No Transportation Needs   Lack of Transportation (Medical): No   Lack of Transportation (Non-Medical): No  Physical Activity: Inactive   Days of Exercise per Week: 0 days   Minutes of Exercise per Session: 0 min  Stress: Stress Concern Present   Feeling of Stress : To some extent  Social Connections: Not on file    Tobacco Counseling Counseling given: Not Answered Tobacco comments: 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 no  Interpreter Needed?: No  Information entered by :: NAllen LPN   Activities of Daily Living In your present state of health, do you have any difficulty performing the following activities: 09/23/2021  Hearing? Y  Vision? N  Difficulty concentrating or making decisions? N  Walking or climbing stairs? N  Dressing or bathing? N  Doing errands, shopping? N  Preparing Food and eating ? N  Using the Toilet? N  In the past six months, have you accidently leaked urine? Y  Do you have problems with loss of bowel control? Y  Managing your Medications? N  Managing your Finances? N  Housekeeping or managing your Housekeeping? N  Some recent data might be hidden    Patient Care Team: [name], Standley Brooking, MD as PCP - General Dahlstedt, Annie Main, MD (Urology)  Indicate any recent Medical Services you may have received from other than [facility] providers in the past year (date may be approximate).     Assessment:   This is a routine wellness examination for [place].  Hearing/Vision screen Vision Screening - Comments:: Regular eye exams, [facility]  Dietary issues and exercise activities discussed: Current Exercise Habits: The patient does not participate in regular exercise at present   Goals Addressed             This Visit's Progress    Patient Stated       09/23/2021, stay alive       Depression Screen PHQ 2/9 Scores 09/23/2021 03/18/2021 09/19/2019 03/03/2019 08/31/2018 02/23/2018 07/20/2017  PHQ - 2 Score 0 1 0 0 1 0 0  PHQ- 9 Score - 3 - - 4 - -    Fall Risk Fall Risk  09/23/2021 03/18/2021 03/11/2020 09/19/2019 03/03/2019  Falls in the past year? 0 0 0 0 1   Number falls in past yr: - 0 - - 0  Injury with Fall? - 0 - - 1  Comment - - - - -  Risk for fall due to : Medication side effect - - Medication side effect -  Follow up Falls evaluation completed;Education provided;Falls prevention discussed - - Falls evaluation completed;Education provided;Falls prevention discussed -    FALL RISK PREVENTION PERTAINING TO THE HOME:  Any stairs in or around the home? Yes  If so, are there any without handrails? No  Home free of loose throw rugs in walkways, pet beds, electrical cords, etc? Yes  Adequate lighting in your home to reduce risk of falls? Yes   ASSISTIVE DEVICES UTILIZED TO PREVENT FALLS:  Life alert? No  Use of a cane, walker or w/c? No  Grab bars in the bathroom? Yes  Shower chair or bench in shower? No  Elevated toilet seat or a handicapped toilet? Yes   TIMED UP AND GO:  Was the test performed? No .    Gait slow and steady without use of assistive device  Cognitive Function: MMSE - Mini Mental State Exam 07/20/2017  Not completed: (No Data)     6CIT Screen 09/23/2021 09/19/2019  What Year? 0 points 0 points  What month? 0 points 0 points  What time? 0 points 0 points  Count back from 20 0 points 0 points  Months in reverse 0 points 0 points  Repeat phrase 0 points 0 points  Total Score 0 0    Immunizations Immunization History  Administered Date(s) Administered   Fluad Quad(high Dose 65+) 05/03/2019, 04/29/2021   Influenza Split 04/21/2011, 04/26/2012   Influenza Whole 05/10/2008, 05/01/2009, 04/15/2010   Influenza, High Dose Seasonal PF 05/01/2014, 05/14/2015, 04/23/2016, 05/03/2017, 04/28/2018, 05/23/2020   Influenza,inj,Quad PF,6+ Mos 05/02/2013   PFIZER Comirnaty(Gray Top)Covid-19 Tri-Sucrose Vaccine 12/09/2020   PFIZER(Purple Top)SARS-COV-2 Vaccination 09/08/2019, 09/29/2019   Pfizer  Covid-19 Vaccine Bivalent Booster 109yrs & up 06/03/2021   Pneumococcal Conjugate-13 10/27/2013   Pneumococcal  Polysaccharide-23 08/03/1998, 02/28/2008   Td 08/03/1994, 02/08/2007   Tdap 08/02/2017   Zoster Recombinat (Shingrix) 07/02/2021    TDAP status: Up to date  Flu Vaccine status: Up to date  Pneumococcal vaccine status: Up to date  Covid-19 vaccine status: Completed vaccines  Qualifies for Shingles Vaccine? Yes   Zostavax completed No   Shingrix Completed?: needs second dose  Screening Tests Health Maintenance  Topic Date Due   Zoster Vaccines- Shingrix (2 of 2) 08/27/2021   DEXA SCAN  09/23/2022 (Originally 09/27/1998)   TETANUS/TDAP  08/03/2027   Pneumonia Vaccine 49+ Years old  Completed   INFLUENZA VACCINE  Completed   COVID-19 Vaccine  Completed   HPV VACCINES  Aged Out    Health Maintenance  Health Maintenance Due  Topic Date Due   Zoster Vaccines- Shingrix (2 of 2) 08/27/2021    Colorectal cancer screening: No longer required.   Mammogram status: Completed 08/22/2021. Repeat every year  Bone Density status: decline  Lung Cancer Screening: (Low Dose CT Chest recommended if Age 28-80 years, 30 pack-year currently smoking OR have quit w/in 15years.) does not qualify.   Lung Cancer Screening Referral: no  Additional Screening:  Hepatitis C Screening: does not qualify;  Vision Screening: Recommended annual ophthalmology exams for early detection of glaucoma and other disorders of the eye. Is the patient up to date with their annual eye exam?  Yes  Who is the provider or what is the name of the office in which the patient attends annual eye exams? [facility] If pt is not established with a provider, would they like to be referred to a provider to establish care? No .   Dental Screening: Recommended annual dental exams for proper oral hygiene  Community Resource Referral / Chronic Care Management: CRR required this visit?  No   CCM required this visit?  No      Plan:     I have personally reviewed and noted the following in the patients chart:    Medical and social history Use of alcohol, tobacco or illicit drugs  Current medications and supplements including opioid prescriptions.  Functional ability and status Nutritional status Physical activity Advanced directives List of other physicians Hospitalizations, surgeries, and ER visits in previous 12 months Vitals Screenings to include cognitive, depression, and falls Referrals and appointments  In addition, I have reviewed and discussed with patient certain preventive protocols, quality metrics, and best practice recommendations. A written personalized care plan for preventive services as well as general preventive health recommendations were provided to patient.     Kellie Simmering, LPN   01/23/6332   Nurse Notes: none

## 2021-09-24 ENCOUNTER — Ambulatory Visit: Payer: Medicare Other | Admitting: Internal Medicine

## 2021-09-24 ENCOUNTER — Ambulatory Visit: Payer: Self-pay | Admitting: General Surgery

## 2021-09-24 DIAGNOSIS — Z170 Estrogen receptor positive status [ER+]: Secondary | ICD-10-CM | POA: Diagnosis not present

## 2021-09-24 DIAGNOSIS — C50211 Malignant neoplasm of upper-inner quadrant of right female breast: Secondary | ICD-10-CM | POA: Diagnosis not present

## 2021-09-26 ENCOUNTER — Other Ambulatory Visit: Payer: Self-pay | Admitting: General Surgery

## 2021-09-26 ENCOUNTER — Other Ambulatory Visit: Payer: Self-pay | Admitting: *Deleted

## 2021-09-26 DIAGNOSIS — Z170 Estrogen receptor positive status [ER+]: Secondary | ICD-10-CM

## 2021-09-26 DIAGNOSIS — C50211 Malignant neoplasm of upper-inner quadrant of right female breast: Secondary | ICD-10-CM

## 2021-09-26 HISTORY — DX: Malignant neoplasm of upper-inner quadrant of right female breast: C50.211

## 2021-09-26 HISTORY — DX: Estrogen receptor positive status (ER+): Z17.0

## 2021-09-30 ENCOUNTER — Telehealth: Payer: Self-pay | Admitting: *Deleted

## 2021-09-30 NOTE — Telephone Encounter (Signed)
Spoke to pt, scheduled and confirmed appt with Dr. Lindi Adie on 3/20 at 3:30 arrival time. No further needs voiced at this time.

## 2021-10-15 NOTE — Progress Notes (Signed)
[facility] ?CONSULT NOTE ? ?Patient Care Team: ?[name], Standley Brooking, MD as PCP - General ?Franchot Gallo, MD (Urology) ?Mauro Kaufmann, RN as Oncology Nurse Navigator ?Rockwell Germany, RN as Oncology Nurse Navigator ? ?CHIEF COMPLAINTS/PURPOSE OF CONSULTATION:  ?Newly diagnosed breast cancer. Right breast Invasive mammary carcinoma ? ?HISTORY OF PRESENTING ILLNESS:  ?[name] [name] 88 y.o. female is here because of recent diagnosis of right  breast Invasive mammary carcinoma. Carcinoma measures 0.7 cm in greatest linear dimension and appears grade 2.The tumor cells are NEGATIVE for Her2 (1+). Estrogen Receptor: 95%, POSITIVE Progesterone Receptor: 5%, POSITIVE. She presents to the clinic today for treatment plan. ? ?I reviewed her records extensively and collaborated the history with the patient. ? ?SUMMARY OF ONCOLOGIC HISTORY: ?Oncology History  ?Malignant neoplasm of upper-inner quadrant of right breast in female, estrogen receptor positive ([place])  ?09/26/2021 Initial Diagnosis  ? Screening mammogram detected right breast distortion indeterminate size, 2 o'clock position biopsy revealed invasive lobular cancer ER 95%, PR 5%, Ki-67 5%, HER2 1+ negative ?  ? ? ? ?MEDICAL HISTORY:  ?Past Medical History:  ?Diagnosis Date  ? Adenomatous colon polyp   ?  4 2008  ? Breast cancer ([place]) 1978  ? left breast  ? Complication of anesthesia   ? Cystitis, interstitial   ? Fracture   ? left ankle and rt wrist  ? FRACTURE, ANKLE, LEFT 12/14/2006  ? Qualifier: Diagnosis of  By: Hulan Saas, CMA (AAMA), Larene Beach S   ? FRACTURE, WRIST 12/14/2006  ? Qualifier: Diagnosis of  By: Hulan Saas, CMA (AAMA), Quita Skye   ? Hyperlipidemia   ? Hypertension   ? Osteoarthritis   ? PONV (postoperative nausea and vomiting)   ? Episode after Ether  ? Rotator cuff tear, right   ? Shingles   ? Urinary incontinence   ? ? ?SURGICAL HISTORY: ?Past Surgical History:  ?Procedure Laterality Date  ? ABDOMINAL HYSTERECTOMY  1988  ? fibroids  ?  DILATION AND CURETTAGE OF UTERUS    ? x 2  ? MASTECTOMY, PARTIAL  1978  ? left  ? SHOULDER ARTHROSCOPY WITH DISTAL CLAVICLE RESECTION Right 12/23/2017  ? Procedure: SHOULDER ARTHROSCOPY WITH DISTAL CLAVICLE RESECTION;  Surgeon: Justice Britain, MD;  Location: [facility];  Service: Orthopedics;  Laterality: Right;  ? SHOULDER ARTHROSCOPY WITH ROTATOR CUFF REPAIR AND SUBACROMIAL DECOMPRESSION Right 12/23/2017  ? Procedure: SHOULDER ARTHROSCOPY WITH ROTATOR CUFF REPAIR AND SUBACROMIAL DECOMPRESSION, distal clavicle resection;  Surgeon: Justice Britain, MD;  Location: [facility];  Service: Orthopedics;  Laterality: Right;  ? TONSILLECTOMY    ? TUBAL LIGATION  77  ? ? ?SOCIAL HISTORY: ?Social History  ? ?Socioeconomic History  ? Marital status: Married  ?  Spouse name: Not on file  ? Number of children: 3  ? Years of education: Not on file  ? Highest education level: Not on file  ?Occupational History  ?  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 Smoking status: Former  ? Smokeless tobacco: Never  ? Tobacco comments:  ?  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 Vaping Use: Never used  ?Substance and Sexual Activity  ? Alcohol use: Not Currently  ?  Comment: none at this time   ? Drug use: No  ? Sexual activity: Not Currently  ?Other Topics Concern  ? Not on file  ?Social History Narrative  ? Retired  ? Married  ? HH of  2  ? Husband with medical problems  ? g5 p3  ? Ex smoker  Quit 33 years ago  [place]  ?   ? 09/19/2019: Has 3 children, one local son who can help occasionally if needed  ? Struggling with taking care of husband at home who is partially blind, but pt. not interested in caregiver or respite support  ? Enjoys reading, walking around the house.  ? ?Social Determinants of Health  ? ?Financial Resource Strain: Low Risk   ? Difficulty of Paying Living Expenses: Not hard at all  ?Food Insecurity: No Food Insecurity  ? Worried About Charity fundraiser in the Last Year: Never true  ? Ran Out of  Food in the Last Year: Never true  ?Transportation Needs: No Transportation Needs  ? Lack of Transportation (Medical): No  ? Lack of Transportation (Non-Medical): No  ?Physical Activity: Inactive  ? Days of Exercise per Week: 0 days  ? Minutes of Exercise per Session: 0 min  ?Stress: Stress Concern Present  ? Feeling of Stress : To some extent  ?Social Connections: Not on file  ?Intimate Partner Violence: Not on file  ? ? ?FAMILY HISTORY: ?Family History  ?Problem Relation Age of Onset  ? Suicidality Mother   ? Heart attack Father   ? Cancer - Other Daughter   ?     throat cancer   ? Thyroid disease Neg Hx   ? ? ?ALLERGIES:  is allergic to nitrofurantoin and sulfamethoxazole. ? ?MEDICATIONS:  ?Current Outpatient Medications  ?Medication Sig Dispense Refill  ? amLODipine (NORVASC) 5 MG tablet Take 1 tablet by mouth once daily 90 tablet 0  ? Ascorbic Acid (VITAMIN C) 500 MG tablet Take 500 mg by mouth 2 (two) times daily.     ? Calcium Carb-Cholecalciferol 600-800 MG-UNIT TABS Take 1 tablet by mouth daily.    ? CINNAMON PO Take 1,000 mg by mouth 2 (two) times daily.  (Patient not taking: Reported on 09/23/2021)    ? doxylamine, Sleep, (UNISOM) 25 MG tablet Take 25 mg by mouth at bedtime as needed for sleep. (Patient not taking: Reported on 09/23/2021)    ? fish oil-omega-3 fatty acids 1000 MG capsule Take 1 g by mouth 2 (two) times daily. (Patient not taking: Reported on 09/23/2021)    ? loratadine (CLARITIN) 10 MG tablet Take 10 mg by mouth daily as needed for allergies.    ? Magnesium 400 MG TABS Take 400 mg by mouth daily. (Patient not taking: Reported on 09/23/2021)    ? Multiple Vitamins-Minerals (CENTRUM SILVER PO) Take 1 tablet by mouth daily.    ? Vitamin D, Cholecalciferol, 1000 UNITS TABS Take 1,000 Units by mouth daily.     ? ?No current facility-administered medications for this visit.  ? ? ?REVIEW OF SYSTEMS:   ?Constitutional: Denies fevers, chills or abnormal night sweats ?  ?All other systems were reviewed  with the patient and are negative. ? ?PHYSICAL EXAMINATION: ?ECOG PERFORMANCE STATUS: 1 - Symptomatic but completely ambulatory ? ?Vitals:  ? 10/20/21 1553  ?BP: 128/68  ?Pulse: 70  ?Resp: 18  ?Temp: (!) 97.5 ?F (36.4 ?C)  ?SpO2: 96%  ? ?Filed Weights  ? 10/20/21 1553  ?Weight: 120 lb 12.8 oz (54.8 kg)  ? ?   ? ?LABORATORY DATA:  ?I have reviewed the data as listed ?Lab Results  ?Component Value Date  ? WBC 6.0 03/18/2021  ? HGB 14.1 03/18/2021  ? HCT 42.2 03/18/2021  ? MCV 94.4  03/18/2021  ? PLT 155.0 03/18/2021  ? ?Lab Results  ?Component Value Date  ? NA 141 03/18/2021  ? K 4.2 03/18/2021  ? CL 106 03/18/2021  ? CO2 25 03/18/2021  ? ? ?RADIOGRAPHIC STUDIES: ?I have personally reviewed the radiological reports and agreed with the findings in the report. ? ?ASSESSMENT AND PLAN:  ?Malignant neoplasm of upper-inner quadrant of right breast in female, estrogen receptor positive ([place]) ?09/26/2021:Screening mammogram detected right breast distortion indeterminate size, 2 o'clock position biopsy revealed invasive lobular cancer ER 95%, PR 5%, Ki-67 5%, HER2 1+ negative ?(On the biopsy the size of the invasive cancer was 7 mm) ? ?Pathology and radiology counseling:Discussed with the patient, the details of pathology including the type of breast cancer,the clinical staging, the significance of ER, PR and HER-2/neu receptors and the implications for treatment. After reviewing the pathology in detail, we proceeded to discuss the different treatment options between surgery,and antiestrogen therapies. ? ?Recommendations: ?1. Breast conserving surgery followed by ?2. Adjuvant antiestrogen therapy ? ? Return to clinic after surgery to discuss final pathology report   ? ? ?All questions were answered. The patient knows to call the clinic with any problems, questions or concerns. ?  ? Harriette Ohara, MD ?10/20/21 ?  ?

## 2021-10-20 ENCOUNTER — Other Ambulatory Visit: Payer: Self-pay

## 2021-10-20 ENCOUNTER — Inpatient Hospital Stay: Payer: Medicare Other | Attending: Hematology and Oncology | Admitting: Hematology and Oncology

## 2021-10-20 DIAGNOSIS — Z808 Family history of malignant neoplasm of other organs or systems: Secondary | ICD-10-CM

## 2021-10-20 DIAGNOSIS — C50211 Malignant neoplasm of upper-inner quadrant of right female breast: Secondary | ICD-10-CM | POA: Diagnosis not present

## 2021-10-20 DIAGNOSIS — Z170 Estrogen receptor positive status [ER+]: Secondary | ICD-10-CM

## 2021-10-20 DIAGNOSIS — Z79899 Other long term (current) drug therapy: Secondary | ICD-10-CM | POA: Insufficient documentation

## 2021-10-20 DIAGNOSIS — Z87891 Personal history of nicotine dependence: Secondary | ICD-10-CM

## 2021-10-20 NOTE — Assessment & Plan Note (Signed)
09/26/2021:Screening mammogram detected right breast distortion indeterminate size, 2 o'clock position biopsy revealed invasive lobular cancer ER 95%, PR 5%, Ki-67 5%, HER2 1+ negative ?(On the biopsy the size of the invasive cancer was 7 mm) ? ?Pathology and radiology counseling:Discussed with the patient, the details of pathology including the type of breast cancer,the clinical staging, the significance of ER, PR and HER-2/neu receptors and the implications for treatment. After reviewing the pathology in detail, we proceeded to discuss the different treatment options between surgery,and antiestrogen therapies. ? ?Recommendations: ?1. Breast conserving surgery followed by ?2. Adjuvant antiestrogen therapy ? ? Return to clinic after surgery to discuss final pathology report   ? ?

## 2021-10-21 ENCOUNTER — Encounter: Payer: Self-pay | Admitting: *Deleted

## 2021-10-23 NOTE — Progress Notes (Signed)
Surgical Instructions ? ? ? Your procedure is scheduled on Monday March 27st. ? Report to [facility] Main Entrance "A" at 8:30 A.M., then check in with the Admitting office. ? Call this number if you have problems the morning of surgery: ? [phone] ? ? If you have any questions prior to your surgery date call [phone]: Open Monday-Friday 8am-4pm ? ? ? Remember: ? Do not eat after midnight the night before your surgery ? ?You may drink clear liquids until 7:30am the morning of your surgery.   ?Clear liquids allowed are: Water, Non-Citrus Juices (without pulp), Carbonated Beverages, Clear Tea, Black Coffee ONLY (NO MILK, CREAM OR POWDERED CREAMER of any kind), and Gatorade ?  ? Take these medicines the morning of surgery with A SIP OF WATER: ?amLODipine (NORVASC) 5 MG tablet ? ? ?IF NEEDED  ?loratadine (CLARITIN) 10 MG tablet ? ? ?As of today, STOP taking any Aspirin (unless otherwise instructed by your surgeon) Aleve, Naproxen, Ibuprofen, Motrin, Advil, Goody's, BC's, all herbal medications, fish oil, and all vitamins. ? ?         ?Do not wear jewelry or makeup ?Do not wear lotions, powders, perfumes, or deodorant. ?Do not shave 48 hours prior to surgery.   ?Do not bring valuables to the hospital. ?Do not wear nail polish, gel polish, artificial nails, or any other type of covering on natural nails (fingers and toes) ?If you have artificial nails or gel coating that need to be removed by a nail salon, please have this removed prior to surgery. Artificial nails or gel coating may interfere with anesthesia's ability to adequately monitor your vital signs. ? ?[facility] [place] is not responsible for any belongings or valuables. .  ? ?Do NOT Smoke (Tobacco/Vaping)  24 hours prior to your procedure ? ?If you use a CPAP at night, you may bring your mask for your overnight stay. ?  ?Contacts, glasses, hearing aids, dentures or partials may not be worn into surgery, please bring cases for these belongings ?  ?For patients  admitted to the hospital, discharge time will be determined by your treatment team. ?  ?Patients discharged the day of surgery will not be allowed to drive home, and someone needs to stay with them for 24 hours. ? ? ?SURGICAL WAITING ROOM VISITATION ?Patients having surgery or a procedure in a hospital may have two support people. ?Children under the age of 26 must have an adult with them who is not the patient. ?They may stay in the waiting area during the procedure and may switch out with other visitors. If the patient needs to stay at the hospital during part of their recovery, the visitor guidelines for inpatient rooms apply. ? ?Please refer to the [facility] website for the visitor guidelines for Inpatients (after your surgery is over and you are in a regular room).  ? ? ? ? ? ?Special instructions:   ? ?Oral Hygiene is also important to reduce your risk of infection.  Remember - BRUSH YOUR TEETH THE MORNING OF SURGERY WITH YOUR REGULAR TOOTHPASTE ? ? ?- Preparing For Surgery ? ?Before surgery, you can play an important role. Because skin is not sterile, your skin needs to be as free of germs as possible. You can reduce the number of germs on your skin by washing with CHG (chlorahexidine gluconate) Soap before surgery.  CHG is an antiseptic cleaner which kills germs and bonds with the skin to continue killing germs even after washing.   ? ? ?Please  do not use if you have an allergy to CHG or antibacterial soaps. If your skin becomes reddened/irritated stop using the CHG.  ?Do not shave (including legs and underarms) for at least 48 hours prior to first CHG shower. It is OK to shave your face. ? ?Please follow these instructions carefully. ?  ? ? Shower the NIGHT BEFORE SURGERY and the MORNING OF SURGERY with CHG Soap.  ? If you chose to wash your hair, wash your hair first as usual with your normal shampoo. After you shampoo, rinse your hair and body thoroughly to remove the shampoo.  Then [employer]  and genitals (private parts) with your normal soap and rinse thoroughly to remove soap. ? ?After that Use CHG Soap as you would any other liquid soap. You can apply CHG directly to the skin and wash gently with a scrungie or a clean washcloth.  ? ?Apply the CHG Soap to your body ONLY FROM THE NECK DOWN.  Do not use on open wounds or open sores. Avoid contact with your eyes, ears, mouth and genitals (private parts). Wash Face and genitals (private parts)  with your normal soap.  ? ?Wash thoroughly, paying special attention to the area where your surgery will be performed. ? ?Thoroughly rinse your body with warm water from the neck down. ? ?DO NOT shower/wash with your normal soap after using and rinsing off the CHG Soap. ? ?Pat yourself dry with a CLEAN TOWEL. ? ?Wear CLEAN PAJAMAS to bed the night before surgery ? ?Place CLEAN SHEETS on your bed the night before your surgery ? ?DO NOT SLEEP WITH PETS. ? ? ?Day of Surgery: ? ?Take a shower with CHG soap. ?Wear Clean/Comfortable clothing the morning of surgery ?Do not apply any deodorants/lotions.   ?Remember to brush your teeth WITH YOUR REGULAR TOOTHPASTE. ? ? ? ?If you received a COVID test during your pre-op visit  it is requested that you wear a mask when out in public, stay away from anyone that may not be feeling well and notify your surgeon if you develop symptoms. If you have been in contact with anyone that has tested positive in the last 10 days please notify you surgeon. ? ?  ?Please read over the following fact sheets that you were given.  ? ?

## 2021-10-24 ENCOUNTER — Other Ambulatory Visit: Payer: Self-pay

## 2021-10-24 ENCOUNTER — Encounter (HOSPITAL_COMMUNITY)
Admission: RE | Admit: 2021-10-24 | Discharge: 2021-10-24 | Disposition: A | Discharge status: Home / Self Care | Payer: Medicare Other | Source: Ambulatory Visit | Attending: General Surgery | Admitting: General Surgery

## 2021-10-24 ENCOUNTER — Other Ambulatory Visit (HOSPITAL_COMMUNITY): Payer: Medicare Other

## 2021-10-24 ENCOUNTER — Encounter (HOSPITAL_COMMUNITY): Payer: Self-pay

## 2021-10-24 ENCOUNTER — Ambulatory Visit
Admission: RE | Admit: 2021-10-24 | Discharge: 2021-10-24 | Disposition: A | Discharge status: Home / Self Care | Payer: Medicare Other | Source: Ambulatory Visit | Attending: General Surgery | Admitting: General Surgery

## 2021-10-24 VITALS — BP 128/64 | HR 77 | Temp 97.9°F | Resp 18 | Ht 62.0 in | Wt 118.8 lb

## 2021-10-24 DIAGNOSIS — E785 Hyperlipidemia, unspecified: Secondary | ICD-10-CM | POA: Insufficient documentation

## 2021-10-24 DIAGNOSIS — Z87891 Personal history of nicotine dependence: Secondary | ICD-10-CM | POA: Insufficient documentation

## 2021-10-24 DIAGNOSIS — I10 Essential (primary) hypertension: Secondary | ICD-10-CM | POA: Insufficient documentation

## 2021-10-24 DIAGNOSIS — C50911 Malignant neoplasm of unspecified site of right female breast: Secondary | ICD-10-CM | POA: Insufficient documentation

## 2021-10-24 DIAGNOSIS — Z01818 Encounter for other preprocedural examination: Secondary | ICD-10-CM | POA: Insufficient documentation

## 2021-10-24 DIAGNOSIS — Z170 Estrogen receptor positive status [ER+]: Secondary | ICD-10-CM

## 2021-10-24 HISTORY — DX: Unspecified hearing loss, unspecified ear: H91.90

## 2021-10-24 LAB — CBC
HCT: 43.5 % (ref 36.0–46.0)
Hemoglobin: 14.1 g/dL (ref 12.0–15.0)
MCH: 31.4 pg (ref 26.0–34.0)
MCHC: 32.4 g/dL (ref 30.0–36.0)
MCV: 96.9 fL (ref 80.0–100.0)
Platelets: 152 10*3/uL (ref 150–400)
RBC: 4.49 MIL/uL (ref 3.87–5.11)
RDW: 13.1 % (ref 11.5–15.5)
WBC: 6.2 10*3/uL (ref 4.0–10.5)
nRBC: 0 % (ref 0.0–0.2)

## 2021-10-24 LAB — BASIC METABOLIC PANEL
Anion gap: 6 (ref 5–15)
BUN: 26 mg/dL — ABNORMAL HIGH (ref 8–23)
CO2: 23 mmol/L (ref 22–32)
Calcium: 8.8 mg/dL — ABNORMAL LOW (ref 8.9–10.3)
Chloride: 113 mmol/L — ABNORMAL HIGH (ref 98–111)
Creatinine, Ser: 0.98 mg/dL (ref 0.44–1.00)
GFR, Estimated: 56 mL/min — ABNORMAL LOW (ref >60)
Glucose, Bld: 103 mg/dL — ABNORMAL HIGH (ref 70–99)
Potassium: 4.3 mmol/L (ref 3.5–5.1)
Sodium: 142 mmol/L (ref 135–145)

## 2021-10-24 NOTE — Progress Notes (Signed)
DUE TO COVID-19 ONLY ONE VISITOR IS ALLOWED TO COME WITH YOU AND STAY IN THE WAITING ROOM ONLY DURING PRE OP AND PROCEDURE DAY OF SURGERY.  ? ?PCP - Dr Shanon Ace ?Cardiologist - n/a ?Oncology - Dr Nicholas Lose ? ?Chest x-ray - n/a ?EKG - 10/24/21 ?Stress Test - n/a ?ECHO - n/a ?Cardiac Cath - n/a ? ?ICD Pacemaker/Loop - none ? ?Sleep Study -  n/a ?CPAP - none ? ?ERAS: Clear liquids til 7:30 AM DOS. ? ?STOP now taking any Aspirin (unless otherwise instructed by your surgeon), Aleve, Naproxen, Ibuprofen, Motrin, Advil, Goody's, BC's, all herbal medications, fish oil, and all vitamins.  ? ?Coronavirus Screening ?Covid test n/a Ambulatory Surgery  ?Do you have any of the following symptoms:  ?Cough yes/no: No ?Fever (>100.7F)  yes/no: No ?Runny nose yes/no: No ?Sore throat yes/no: No ?Difficulty breathing/shortness of breath  yes/no: No ? ?Have you traveled in the last 14 days and where? yes/no: No ? ?Patient verbalized understanding of instructions that were given to them at the PAT appointment. Patient was also instructed that they will need to review over the PAT instructions again at home before surgery. ? ?

## 2021-10-24 NOTE — Progress Notes (Signed)
Anesthesia Chart Review: ? Case: [id number] Date/Time: 10/27/21 1015  ? Procedure: RIGHT BREAST LUMPECTOMY WITH RADIOACTIVE SEED LOCALIZATION (Right: Breast)  ? Anesthesia type: General  ? Pre-op diagnosis: RIGHT BREAST CANCER  ? [facility] ROOM 02 / [facility]  ? Surgeons: Jovita Kussmaul, MD  ? ?  ? ? ?DISCUSSION: Patient is an 88 year old female scheduled for the above procedure.  Her PAT visit was at 10/24/21 at 11:00 AM. RSL scheduled same day at 2:00 PM. Chart forwarded for anesthesia review after 3:00 PM. ? ?Her history includes former smoker (quit ~ 50 years ago), post-operative N/V, HTN, HLD, hearing loss (hearing aids), breast cancer (left, s/p left mastectomy 1978; 09/18/21 right breast biopsy: invasive mammary carcinoma), right rotator cuff repair (12/23/17). ? ?Her 10/24/21 EKG showed SR with frequent (three) PVCs. This was similar to her preoperative tracing prior to 2019 shoulder surgery. No CV symptoms per PAT RN interview. She in on amlodipine for HTN. BP stable at 128/64. K 4.3.  ?  ?Anesthesia team to evaluate on the day of surgery. ? ? ?VS: BP 128/64   Pulse 77   Temp 36.6 ?C (Oral)   Resp 18   Ht '5\' 2"'$  (1.575 m)   Wt 53.9 kg   SpO2 98%   BMI 21.73 kg/m?  ? ? ?PROVIDERS: ?[name], Standley Brooking, MD is PCP. Last annual exam 03/18/21. More recent virtual visit to discuss new diagnosis of right breast cancer. Patient was unsure about pursuing treatment. 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 She was advised to follow-up with referrals in hopes she could make the best informed decision about what treatments she may or may not want to pursue.  ?- Nicholas Lose, MD is HEM-ONC ? ? ?LABS: Labs reviewed: Acceptable for surgery. A1c 6.0% 03/18/21. ?(all labs ordered are listed, but only abnormal results are displayed) ? ?Labs Reviewed  ?BASIC METABOLIC PANEL - Abnormal; Notable for the following components:  ?    Result Value   ? Chloride 113 (*)   ? Glucose, Bld 103 (*)   ? BUN 26 (*)   ? Calcium 8.8 (*)   ? GFR, Estimated 56 (*)   ? All other components within normal limits  ?CBC  ? ? ?EKG: 10/24/21: ?Sinus rhythm with marked sinus arrhythmia with frequent Premature ventricular complexes ?Possible Left atrial enlargement ?- She had possible LAE and frequent PVCs on preoperative EKG from 12/17/17 as well.  ? ? ?CV: N/A ? ?Past Medical History:  ?Diagnosis Date  ? Adenomatous colon polyp   ?  4 2008  ? Breast cancer ([place]) 1978  ? left breast  ? Complication of anesthesia   ? Cystitis, interstitial   ? FRACTURE, ANKLE, LEFT 12/14/2006  ? Qualifier: Diagnosis of  By: Hulan Saas, CMA (AAMA), Larene Beach S   ? FRACTURE, WRIST 12/14/2006  ? Qualifier: Diagnosis of  By: Hulan Saas, CMA (AAMA), Quita Skye   ? Hearing loss   ? does not use hearing aids  ? Hyperlipidemia   ? Hypertension   ? Osteoarthritis   ? PONV (postoperative nausea and vomiting)   ? Episode after Ether  ? Rotator cuff tear, right   ? Shingles   ? Urinary incontinence   ? ? ?Past Surgical History:  ?Procedure Laterality Date  ? ABDOMINAL HYSTERECTOMY  08/03/1986  ? fibroids  ? COLONOSCOPY    ? DILATION AND CURETTAGE OF UTERUS    ?  x 2  ? SHOULDER ARTHROSCOPY WITH DISTAL CLAVICLE RESECTION Right 12/23/2017  ? Procedure: SHOULDER ARTHROSCOPY WITH DISTAL CLAVICLE RESECTION;  Surgeon: Justice Britain, MD;  Location: [facility];  Service: Orthopedics;  Laterality: Right;  ? SHOULDER ARTHROSCOPY WITH ROTATOR CUFF REPAIR AND SUBACROMIAL DECOMPRESSION Right 12/23/2017  ? Procedure: SHOULDER ARTHROSCOPY WITH ROTATOR CUFF REPAIR AND SUBACROMIAL DECOMPRESSION, distal clavicle resection;  Surgeon: Justice Britain, MD;  Location: [facility];  Service: Orthopedics;  Laterality: Right;  ? TONSILLECTOMY    ? TUBAL LIGATION  08/03/1973  ? WISDOM TOOTH EXTRACTION    ? upper wisdom teeth only ext  ? ? ?MEDICATIONS: ? amLODipine (NORVASC) 5 MG tablet  ? Ascorbic Acid (VITAMIN C) 500 MG tablet  ? Calcium  Carb-Cholecalciferol 600-800 MG-UNIT TABS  ? loratadine (CLARITIN) 10 MG tablet  ? MELATONIN PO  ? Multiple Vitamins-Minerals (CENTRUM SILVER PO)  ? ?No current facility-administered medications for this encounter.  ? ? ?Myra Gianotti, PA-C ?Surgical Short Stay/Anesthesiology ?[facility] Phone [phone] ?[facility] Phone [phone] ?10/24/2021 3:44 PM ? ? ? ? ? ? ? ?

## 2021-10-24 NOTE — Anesthesia Preprocedure Evaluation (Addendum)
Anesthesia Evaluation  ?Patient identified by MRN, date of birth, ID band ?Patient awake ? ? ? ?Reviewed: ?Allergy & Precautions, NPO status , Patient's Chart, lab work & pertinent test results ? ?Airway ?Mallampati: II ? ?TM Distance: >3 FB ?Neck ROM: Full ? ? ? Dental ?no notable dental hx. ? ?  ?Pulmonary ?former smoker,  ?  ?Pulmonary exam normal ?breath sounds clear to auscultation ? ? ? ? ? ? Cardiovascular ?hypertension, Pt. on medications ?Normal cardiovascular exam ?Rhythm:Regular Rate:Normal ? ? ?  ?Neuro/Psych ?Hearing loss ?negative psych ROS  ? GI/Hepatic ?negative GI ROS, Neg liver ROS,   ?Endo/Other  ?negative endocrine ROS ? Renal/GU ?negative Renal ROS  ? ?  ?Musculoskeletal ? ?(+) Arthritis ,  ? Abdominal ?  ?Peds ? Hematology ?negative hematology ROS ?(+)   ?Anesthesia Other Findings ?RIGHT BREAST CANCER ? Reproductive/Obstetrics ? ?  ? ? ? ? ? ? ? ? ? ? ? ? ? ?  ?  ? ? ? ? ? ? ? ?Anesthesia Physical ?Anesthesia Plan ? ?ASA: 2 ? ?Anesthesia Plan: General  ? ?Post-op Pain Management:   ? ?Induction: Intravenous ? ?PONV Risk Score and Plan: 3 and Ondansetron, Dexamethasone and Treatment may vary due to age or medical condition ? ?Airway Management Planned: LMA ? ?Additional Equipment:  ? ?Intra-op Plan:  ? ?Post-operative Plan: Extubation in OR ? ?Informed Consent: I have reviewed the patients History and Physical, chart, labs and discussed the procedure including the risks, benefits and alternatives for the proposed anesthesia with the patient or authorized representative who has indicated his/her understanding and acceptance.  ? ? ? ?Dental advisory given ? ?Plan Discussed with: CRNA ? ?Anesthesia Plan Comments: (PAT note written 10/24/2021 by Myra Gianotti, PA-C. ?)  ? ? ? ? ? ?Anesthesia Quick Evaluation ? ?

## 2021-10-27 ENCOUNTER — Encounter (HOSPITAL_COMMUNITY): Payer: Self-pay | Admitting: General Surgery

## 2021-10-27 ENCOUNTER — Encounter (HOSPITAL_COMMUNITY)
Admission: RE | Disposition: A | Discharge status: Home / Self Care | Payer: Self-pay | Source: Home / Self Care | Attending: General Surgery

## 2021-10-27 ENCOUNTER — Other Ambulatory Visit: Payer: Self-pay

## 2021-10-27 ENCOUNTER — Ambulatory Visit (HOSPITAL_COMMUNITY)
Admission: RE | Admit: 2021-10-27 | Discharge: 2021-10-27 | Disposition: A | Discharge status: Home / Self Care | Payer: Medicare Other | Attending: General Surgery | Admitting: General Surgery

## 2021-10-27 ENCOUNTER — Ambulatory Visit
Admission: RE | Admit: 2021-10-27 | Discharge: 2021-10-27 | Disposition: A | Discharge status: Home / Self Care | Payer: Medicare Other | Source: Ambulatory Visit | Attending: General Surgery | Admitting: General Surgery

## 2021-10-27 ENCOUNTER — Ambulatory Visit (HOSPITAL_COMMUNITY): Payer: Medicare Other | Admitting: Certified Registered"

## 2021-10-27 ENCOUNTER — Ambulatory Visit (HOSPITAL_BASED_OUTPATIENT_CLINIC_OR_DEPARTMENT_OTHER): Payer: Medicare Other | Admitting: Certified Registered"

## 2021-10-27 DIAGNOSIS — C50211 Malignant neoplasm of upper-inner quadrant of right female breast: Secondary | ICD-10-CM | POA: Insufficient documentation

## 2021-10-27 DIAGNOSIS — C50911 Malignant neoplasm of unspecified site of right female breast: Secondary | ICD-10-CM | POA: Diagnosis not present

## 2021-10-27 DIAGNOSIS — Z170 Estrogen receptor positive status [ER+]: Secondary | ICD-10-CM | POA: Diagnosis not present

## 2021-10-27 DIAGNOSIS — I10 Essential (primary) hypertension: Secondary | ICD-10-CM | POA: Diagnosis not present

## 2021-10-27 DIAGNOSIS — Z87891 Personal history of nicotine dependence: Secondary | ICD-10-CM | POA: Diagnosis not present

## 2021-10-27 DIAGNOSIS — Z853 Personal history of malignant neoplasm of breast: Secondary | ICD-10-CM | POA: Diagnosis not present

## 2021-10-27 DIAGNOSIS — R928 Other abnormal and inconclusive findings on diagnostic imaging of breast: Secondary | ICD-10-CM | POA: Diagnosis not present

## 2021-10-27 HISTORY — PX: BREAST LUMPECTOMY WITH RADIOACTIVE SEED LOCALIZATION: SHX6424

## 2021-10-27 HISTORY — PX: BREAST LUMPECTOMY: SHX2

## 2021-10-27 SURGERY — BREAST LUMPECTOMY WITH RADIOACTIVE SEED LOCALIZATION
Anesthesia: General | Site: Breast | Laterality: Right

## 2021-10-27 MED ORDER — PHENYLEPHRINE 40 MCG/ML (10ML) SYRINGE FOR IV PUSH (FOR BLOOD PRESSURE SUPPORT)
PREFILLED_SYRINGE | INTRAVENOUS | Status: AC
  Filled 2021-10-27: qty 10

## 2021-10-27 MED ORDER — FENTANYL CITRATE (PF) 100 MCG/2ML IJ SOLN
INTRAMUSCULAR | Status: DC | PRN
Start: 2021-10-27 — End: 2021-10-27
  Administered 2021-10-27: 25 ug via INTRAVENOUS

## 2021-10-27 MED ORDER — CEFAZOLIN SODIUM-DEXTROSE 2-4 GM/100ML-% IV SOLN
INTRAVENOUS | Status: AC
  Filled 2021-10-27: qty 100

## 2021-10-27 MED ORDER — OXYCODONE HCL 5 MG PO TABS: 5.0000 mg | ORAL_TABLET | Freq: Four times a day (QID) | ORAL | 0 refills | Status: DC | PRN

## 2021-10-27 MED ORDER — CHLORHEXIDINE GLUCONATE CLOTH 2 % EX PADS: 6.0000 | MEDICATED_PAD | Freq: Once | CUTANEOUS | Status: DC

## 2021-10-27 MED ORDER — LIDOCAINE HCL (CARDIAC) PF 100 MG/5ML IV SOSY
PREFILLED_SYRINGE | INTRAVENOUS | Status: DC | PRN
Start: 2021-10-27 — End: 2021-10-27
  Administered 2021-10-27: 60 mg via INTRAVENOUS

## 2021-10-27 MED ORDER — ACETAMINOPHEN 500 MG PO TABS
ORAL_TABLET | ORAL | Status: AC
  Administered 2021-10-27: 1000 mg via ORAL
  Filled 2021-10-27: qty 2

## 2021-10-27 MED ORDER — PROPOFOL 10 MG/ML IV BOLUS
INTRAVENOUS | Status: AC
  Filled 2021-10-27: qty 20

## 2021-10-27 MED ORDER — CEFAZOLIN SODIUM-DEXTROSE 2-4 GM/100ML-% IV SOLN
2.0000 g | INTRAVENOUS | Status: AC
  Administered 2021-10-27: 2 g via INTRAVENOUS

## 2021-10-27 MED ORDER — FENTANYL CITRATE (PF) 250 MCG/5ML IJ SOLN
INTRAMUSCULAR | Status: AC
  Filled 2021-10-27: qty 5

## 2021-10-27 MED ORDER — FENTANYL CITRATE (PF) 100 MCG/2ML IJ SOLN: 25.0000 ug | INTRAMUSCULAR | Status: DC | PRN

## 2021-10-27 MED ORDER — ACETAMINOPHEN 500 MG PO TABS: 1000.0000 mg | ORAL_TABLET | ORAL | Status: AC

## 2021-10-27 MED ORDER — EPHEDRINE SULFATE (PRESSORS) 50 MG/ML IJ SOLN
INTRAMUSCULAR | Status: DC | PRN
  Administered 2021-10-27 (×4): 5 mg via INTRAVENOUS

## 2021-10-27 MED ORDER — ONDANSETRON HCL 4 MG/2ML IJ SOLN: 4.0000 mg | Freq: Once | INTRAMUSCULAR | Status: DC | PRN

## 2021-10-27 MED ORDER — PROPOFOL 10 MG/ML IV BOLUS
INTRAVENOUS | Status: DC | PRN
Start: 2021-10-27 — End: 2021-10-27
  Administered 2021-10-27: 150 mg via INTRAVENOUS

## 2021-10-27 MED ORDER — DEXAMETHASONE SODIUM PHOSPHATE 10 MG/ML IJ SOLN
INTRAMUSCULAR | Status: DC | PRN
  Administered 2021-10-27: 5 mg via INTRAVENOUS

## 2021-10-27 MED ORDER — DEXAMETHASONE SODIUM PHOSPHATE 10 MG/ML IJ SOLN
INTRAMUSCULAR | Status: AC
  Filled 2021-10-27: qty 1

## 2021-10-27 MED ORDER — AMISULPRIDE (ANTIEMETIC) 5 MG/2ML IV SOLN: 10.0000 mg | Freq: Once | INTRAVENOUS | Status: DC | PRN

## 2021-10-27 MED ORDER — PHENYLEPHRINE HCL (PRESSORS) 10 MG/ML IV SOLN
INTRAVENOUS | Status: DC | PRN
Start: 2021-10-27 — End: 2021-10-27
  Administered 2021-10-27: 40 ug via INTRAVENOUS

## 2021-10-27 MED ORDER — CHLORHEXIDINE GLUCONATE 0.12 % MT SOLN
15.0000 mL | Freq: Once | OROMUCOSAL | Status: AC
  Administered 2021-10-27: 15 mL via OROMUCOSAL
  Filled 2021-10-27: qty 15

## 2021-10-27 MED ORDER — 0.9 % SODIUM CHLORIDE (POUR BTL) OPTIME
TOPICAL | Status: DC | PRN
Start: 2021-10-27 — End: 2021-10-27
  Administered 2021-10-27: 1000 mL

## 2021-10-27 MED ORDER — LACTATED RINGERS IV SOLN
INTRAVENOUS | Status: DC
Start: 2021-10-27 — End: 2021-10-27

## 2021-10-27 MED ORDER — LIDOCAINE 2% (20 MG/ML) 5 ML SYRINGE
INTRAMUSCULAR | Status: AC
  Filled 2021-10-27: qty 5

## 2021-10-27 MED ORDER — ORAL CARE MOUTH RINSE: 15.0000 mL | Freq: Once | OROMUCOSAL | Status: AC

## 2021-10-27 MED ORDER — BUPIVACAINE-EPINEPHRINE (PF) 0.25% -1:200000 IJ SOLN
INTRAMUSCULAR | Status: AC
  Filled 2021-10-27: qty 30

## 2021-10-27 MED ORDER — GABAPENTIN 100 MG PO CAPS: 100.0000 mg | ORAL_CAPSULE | ORAL | Status: AC

## 2021-10-27 MED ORDER — ONDANSETRON HCL 4 MG/2ML IJ SOLN
INTRAMUSCULAR | Status: AC
  Filled 2021-10-27: qty 2

## 2021-10-27 MED ORDER — ONDANSETRON HCL 4 MG/2ML IJ SOLN
INTRAMUSCULAR | Status: DC | PRN
  Administered 2021-10-27: 4 mg via INTRAVENOUS

## 2021-10-27 MED ORDER — GABAPENTIN 100 MG PO CAPS
ORAL_CAPSULE | ORAL | Status: AC
  Administered 2021-10-27: 100 mg via ORAL
  Filled 2021-10-27: qty 1

## 2021-10-27 MED ORDER — BUPIVACAINE-EPINEPHRINE 0.25% -1:200000 IJ SOLN
INTRAMUSCULAR | Status: DC | PRN
  Administered 2021-10-27: 19 mL

## 2021-10-27 SURGICAL SUPPLY — 37 items
ADH SKN CLS APL DERMABOND .7 (GAUZE/BANDAGES/DRESSINGS) ×1
APL PRP STRL LF DISP 70% ISPRP (MISCELLANEOUS) ×1
APPLIER CLIP 9.375 MED OPEN (MISCELLANEOUS)
APR CLP MED 9.3 20 MLT OPN (MISCELLANEOUS)
BAG COUNTER SPONGE SURGICOUNT (BAG) IMPLANT
BAG SPNG CNTER NS LX DISP (BAG)
BINDER BREAST LRG (GAUZE/BANDAGES/DRESSINGS) ×1 IMPLANT
BINDER BREAST XLRG (GAUZE/BANDAGES/DRESSINGS) IMPLANT
CANISTER SUCT 3000ML PPV (MISCELLANEOUS) ×3 IMPLANT
CHLORAPREP W/TINT 26 (MISCELLANEOUS) ×3 IMPLANT
CLIP APPLIE 9.375 MED OPEN (MISCELLANEOUS) IMPLANT
COVER PROBE W GEL 5X96 (DRAPES) ×3 IMPLANT
COVER SURGICAL LIGHT HANDLE (MISCELLANEOUS) ×3 IMPLANT
DERMABOND ADVANCED (GAUZE/BANDAGES/DRESSINGS) ×1
DERMABOND ADVANCED .7 DNX12 (GAUZE/BANDAGES/DRESSINGS) ×2 IMPLANT
DEVICE DUBIN SPECIMEN MAMMOGRA (MISCELLANEOUS) ×3 IMPLANT
DRAPE CHEST BREAST 15X10 FENES (DRAPES) ×3 IMPLANT
ELECT COATED BLADE 2.86 ST (ELECTRODE) ×3 IMPLANT
ELECT REM PT RETURN 9FT ADLT (ELECTROSURGICAL) ×2
ELECTRODE REM PT RTRN 9FT ADLT (ELECTROSURGICAL) ×2 IMPLANT
GAUZE SPONGE 4X4 12PLY STRL (GAUZE/BANDAGES/DRESSINGS) ×1 IMPLANT
GLOVE SURG ENC MOIS LTX SZ7.5 (GLOVE) ×6 IMPLANT
GOWN STRL REUS W/ TWL LRG LVL3 (GOWN DISPOSABLE) ×4 IMPLANT
GOWN STRL REUS W/TWL LRG LVL3 (GOWN DISPOSABLE) ×4
KIT BASIN OR (CUSTOM PROCEDURE TRAY) ×3 IMPLANT
KIT MARKER MARGIN INK (KITS) ×3 IMPLANT
LIGHT WAVEGUIDE WIDE FLAT (MISCELLANEOUS) IMPLANT
NDL HYPO 25GX1X1/2 BEV (NEEDLE) ×2 IMPLANT
NEEDLE HYPO 25GX1X1/2 BEV (NEEDLE) ×2 IMPLANT
NS IRRIG 1000ML POUR BTL (IV SOLUTION) ×3 IMPLANT
PACK GENERAL/GYN (CUSTOM PROCEDURE TRAY) ×3 IMPLANT
SUT MNCRL AB 4-0 PS2 18 (SUTURE) ×3 IMPLANT
SUT SILK 2 0 SH (SUTURE) IMPLANT
SUT VIC AB 3-0 SH 18 (SUTURE) ×3 IMPLANT
SYR CONTROL 10ML LL (SYRINGE) ×3 IMPLANT
TOWEL GREEN STERILE (TOWEL DISPOSABLE) ×3 IMPLANT
TOWEL GREEN STERILE FF (TOWEL DISPOSABLE) ×3 IMPLANT

## 2021-10-27 NOTE — Transfer of Care (Signed)
Immediate Anesthesia Transfer of Care Note ? ?Patient: [name] [name] ? ?Procedure(s) Performed: RIGHT BREAST LUMPECTOMY WITH RADIOACTIVE SEED LOCALIZATION (Right: Breast) ? ?Patient Location: PACU ? ?Anesthesia Type:General ? ?Level of Consciousness: awake, alert  and oriented ? ?Airway & Oxygen Therapy: Patient Spontanous Breathing and Patient connected to face mask oxygen ? ?Post-op Assessment: Report given to RN and Post -op Vital signs reviewed and stable ? ?Post vital signs: Reviewed and stable ? ?Last Vitals:  ?Vitals Value Taken Time  ?BP 137/66 10/27/21 1142  ?Temp    ?Pulse 77 10/27/21 1144  ?Resp 15 10/27/21 1144  ?SpO2 100 % 10/27/21 1144  ?Vitals shown include unvalidated device data. ? ?Last Pain:  ?Vitals:  ? 10/27/21 0825  ?TempSrc: Oral  ?   ? ?  ? ?Complications: No notable events documented. ?

## 2021-10-27 NOTE — Anesthesia Procedure Notes (Signed)
Procedure Name: LMA Insertion ?Date/Time: 10/27/2021 10:44 AM ?Performed by: Lavonia Dana, CRNA ?Pre-anesthesia Checklist: Patient identified, Emergency Drugs available, Suction available and Patient being monitored ?Patient Re-evaluated:Patient Re-evaluated prior to induction ?Oxygen Delivery Method: Circle system utilized ?Preoxygenation: Pre-oxygenation with 100% oxygen ?Induction Type: IV induction ?Ventilation: Mask ventilation without difficulty ?LMA: LMA inserted ?LMA Size: 3.0 ?Number of attempts: 1 ?Airway Equipment and Method: Bite block ?Placement Confirmation: positive ETCO2 ?Tube secured with: Tape ?Dental Injury: Teeth and Oropharynx as per pre-operative assessment  ? ? ? ? ?

## 2021-10-27 NOTE — H&P (Signed)
?REFERRING PHYSICIAN: [name], Clarisa Kindred, MD ? ?PROVIDER: Landry Corporal, MD ? ?MRN: [id number] ?DOB: 11-03-1931 ?Subjective  ? ?Chief Complaint: Breast Problem ? ? ?History of Present Illness: ?[name] [name] is a 88 y.o. female who is seen today as an office consultation at the request of Dr. Regis Bill for evaluation of Breast Problem ?.  ? ?We are asked to see the patient in consultation by Dr. Regis Bill to evaluate her for a new right breast cancer. The patient is an 88 year old white female who recently went for a routine screening mammogram. At that time she was found to have a small area of distortion in the inner aspect of the right breast that probably measured between 1 and 2 cm. The lymph nodes looked normal. The distortion was biopsied and came back as an invasive lobular type of cancer that was ER and PR positive and HER2 negative with a Ki-67 of 5%. She is otherwise in pretty good health for her age. She does have a history of left breast cancer treated with mastectomy in [place] in [place]. She quit smoking about 50 years ago ? ?Review of Systems: ?A complete review of systems was obtained from the patient. I have reviewed this information and discussed as appropriate with the patient. See HPI as well for other ROS. ? ?ROS  ? ?Medical History: ?Past Medical History:  ?Diagnosis Date  ? History of cancer  ? Hypertension  ? ?Patient Active Problem List  ?Diagnosis  ? Malignant neoplasm of upper-inner quadrant of right breast in female, estrogen receptor positive (CMS-HCC)  ? ?Past Surgical History:  ?Procedure Laterality Date  ? HYSTERECTOMY  ? MASTECTOMY  ? shoulder repair surgery N/A  ? ? ?Allergies  ?Allergen Reactions  ? Black Pepper Other (See Comments)  ? Nitrofurantoin Itching  ?Head to toe itching ? ? Sulfamethoxazole Other (See Comments)  ?Unknown reaction  ? ? ?Current Outpatient Medications on File Prior to Visit  ?Medication Sig Dispense Refill  ? amLODIPine (NORVASC) 5 MG tablet amlodipine 5  mg tablet  ? ascorbic acid, vitamin C, (VITAMIN C) 500 MG tablet ascorbic acid (vitamin C) 500 mg tablet ?500 mg by oral route.  ? calcium carbonate-vitamin D3 600 mg-20 mcg (800 unit) Tab Take 1 tablet by mouth once daily  ? cholecalciferol (VITAMIN D3) 1000 unit tablet cholecalciferol (vitamin D3) 25 mcg (1,000 unit) tablet ?1000 units by oral route.  ? docosahexaenoic acid-epa 120-180 mg Cap Take 1 g by mouth 2 (two) times daily  ? loratadine (CLARITIN) 10 mg tablet loratadine 10 mg tablet ?10 mg by oral route.  ? ?No current facility-administered medications on file prior to visit.  ? ?Family History  ?Problem Relation Age of Onset  ? High blood pressure (Hypertension) Father  ? ? ?Social History  ? ?Tobacco Use  ?Smoking Status Former  ? Types: Cigarettes  ?Smokeless Tobacco Never  ? ? ?Social History  ? ?Socioeconomic History  ? Marital status: Married  ?Tobacco Use  ? Smoking status: Former  ?Types: Cigarettes  ? Smokeless tobacco: Never  ?Vaping Use  ? Vaping Use: Never used  ?Substance and Sexual Activity  ? Alcohol use: Never  ? Drug use: Never  ? ?Objective:  ? ?Vitals:  ?BP: 102/70  ?Pulse: 86  ?Temp: 37.1 ?C (98.7 ?F)  ?SpO2: 95%  ?Weight: 54.5 kg (120 lb 3.2 oz)  ?Height: 157.5 cm (_0 )  ? ?Body mass index is 21.98 kg/m?. ? ?Physical Exam ?Vitals reviewed.  ?Constitutional:  ?General: She is  not in acute distress. ?Appearance: Normal appearance.  ?HENT:  ?Head: Normocephalic and atraumatic.  ?Right Ear: External ear normal.  ?Left Ear: External ear normal.  ?Nose: Nose normal.  ?Mouth/Throat:  ?Mouth: Mucous membranes are moist.  ?Pharynx: Oropharynx is clear.  ?Eyes:  ?General: No scleral icterus. ?Extraocular Movements: Extraocular movements intact.  ?Conjunctiva/sclera: Conjunctivae normal.  ?Pupils: Pupils are equal, round, and reactive to light.  ?Cardiovascular:  ?Rate and Rhythm: Normal rate and regular rhythm.  ?Pulses: Normal pulses.  ?Heart sounds: Normal heart sounds.  ?Pulmonary:   ?Effort: Pulmonary effort is normal. No respiratory distress.  ?Breath sounds: Normal breath sounds.  ?Abdominal:  ?General: Bowel sounds are normal.  ?Palpations: Abdomen is soft.  ?Tenderness: There is no abdominal tenderness.  ?Musculoskeletal:  ?General: No swelling, tenderness or deformity. Normal range of motion.  ?Cervical back: Normal range of motion and neck supple.  ?Skin: ?General: Skin is warm and dry.  ?Coloration: Skin is not jaundiced.  ?Neurological:  ?General: No focal deficit present.  ?Mental Status: She is alert and oriented to person, place, and time.  ?Psychiatric:  ?Mood and Affect: Mood normal.  ?Behavior: Behavior normal.  ? ? ? ?Breast: There is a palpable bruise in the inner aspect of the right breast. Other than this there is no other palpable mass in the right breast. There is no palpable mass of the left chest wall. There is no palpable axillary, supraclavicular, or cervical lymphadenopathy. ? ?Labs, Imaging and Diagnostic Testing: ? ?Assessment and Plan:  ? ?Diagnoses and all orders for this visit: ? ?Malignant neoplasm of upper-inner quadrant of right breast in female, estrogen receptor positive (CMS-HCC) ?- Ambulatory Referral to Oncology-Medical ?- Ambulatory Referral to Radiation Oncology ?- [facility] Case Posting Request; Future ? ? ? ?The patient appears to have a small stage I cancer in the inner aspect of the right breast with clinically negative nodes and all favorable markers. I have discussed with her in detail the different options for treatment and at this point she favors breast conservation which I feel is very reasonable. Given her age and the favorable nature of the cancer she will not need a node evaluation. I discussed with her in detail the risks and benefits of the operation as well as some of the technical aspects including the use of a radioactive seed for localization and she understands and wishes to proceed. I will refer her to medical and radiation oncology to  discuss adjuvant therapy.  ?

## 2021-10-27 NOTE — Interval H&P Note (Signed)
History and Physical Interval Note: ? ?10/27/2021 ?9:36 AM ? ?[name] [name]  has presented today for surgery, with the diagnosis of RIGHT BREAST CANCER.  The various methods of treatment have been discussed with the patient and family. After consideration of risks, benefits and other options for treatment, the patient has consented to  Procedure(s): ?RIGHT BREAST LUMPECTOMY WITH RADIOACTIVE SEED LOCALIZATION (Right) as a surgical intervention.  The patient's history has been reviewed, patient examined, no change in status, stable for surgery.  I have reviewed the patient's chart and labs.  Questions were answered to the patient's satisfaction.   ? ? ?Autumn Messing III ? ? ?

## 2021-10-27 NOTE — Op Note (Signed)
10/27/2021 ? ?11:32 AM ? ?PATIENT:  [name] [name]  88 y.o. female ? ?PRE-OPERATIVE DIAGNOSIS:  RIGHT BREAST CANCER ? ?POST-OPERATIVE DIAGNOSIS:  RIGHT BREAST CANCER ? ?PROCEDURE:  Procedure(s): ?RIGHT BREAST LUMPECTOMY WITH RADIOACTIVE SEED LOCALIZATION (Right) ? ?SURGEON:  Surgeon(s) and Role: ?   Jovita Kussmaul, MD - Primary ? ?PHYSICIAN ASSISTANT:  ? ?ASSISTANTS: none  ? ?ANESTHESIA:   local and general ? ?EBL:  10 mL  ? ?BLOOD ADMINISTERED:none ? ?DRAINS: none  ? ?LOCAL MEDICATIONS USED:  MARCAINE    ? ?SPECIMEN:  Source of Specimen:  right breast tissue ? ?DISPOSITION OF SPECIMEN:  PATHOLOGY ? ?COUNTS:  YES ? ?TOURNIQUET:  * No tourniquets in log * ? ?DICTATION: .Dragon Dictation ? ?After informed consent was obtained the patient was brought to the operating room and placed in the supine position on the operating table.  After adequate induction of general anesthesia the patient's right breast was prepped with ChloraPrep, allowed to dry, and draped in usual sterile manner.  An appropriate timeout was performed.  Previously an I-125 seed was placed in the upper inner central right breast to mark an area of invasive breast cancer.  The neoprobe was set to I-125 in the area of radioactivity was readily identified.  The area around this was infiltrated with quarter percent Marcaine.  A curvilinear incision was then made along the upper inner edge of the areola with a 15 blade knife.  The incision was carried through the skin and subcutaneous tissue sharply with the electrocautery.  Dissection was then carried throughout the upper inner quadrant between the breast tissue and the subcutaneous fat and skin.  Once the dissection was well beyond the area of the cancer I then removed a circular portion of breast tissue sharply with the electrocautery around the radioactive seed while checking the area of radioactivity frequently.  Once the specimen was removed it was oriented with the appropriate paint colors.  A  specimen radiograph was obtained that showed the clip and seed to be near the center of the specimen.  The specimen was then sent to pathology for further evaluation.  Hemostasis was achieved using the Bovie electrocautery.  The dissection was carried all the way to the muscle of the chest wall.  The wound was then irrigated with saline and infiltrated with more quarter percent Marcaine.  The deep layer of the wound was closed with layers of interrupted 3-0 Vicryl stitches.  The skin was then closed with interrupted 4-0 Monocryl subcuticular stitches.  Dermabond dressings were applied.  The patient tolerated the procedure well.  At the end of the case all needle sponge and instrument counts were correct.  The patient was then awakened and taken to recovery in stable condition. ? ?PLAN OF CARE: Discharge to home after PACU ? ?PATIENT DISPOSITION:  PACU - hemodynamically stable. ?  ?Delay start of Pharmacological VTE agent (>24hrs) due to surgical blood loss or risk of bleeding: not applicable ? ?

## 2021-10-28 ENCOUNTER — Encounter (HOSPITAL_COMMUNITY): Payer: Self-pay | Admitting: General Surgery

## 2021-10-28 ENCOUNTER — Other Ambulatory Visit: Payer: Self-pay | Admitting: Internal Medicine

## 2021-10-28 NOTE — Anesthesia Postprocedure Evaluation (Signed)
Anesthesia Post Note ? ?Patient: [name] [name] ? ?Procedure(s) Performed: RIGHT BREAST LUMPECTOMY WITH RADIOACTIVE SEED LOCALIZATION (Right: Breast) ? ?  ? ?Patient location during evaluation: PACU ?Anesthesia Type: General ?Level of consciousness: awake ?Pain management: pain level controlled ?Vital Signs Assessment: post-procedure vital signs reviewed and stable ?Respiratory status: spontaneous breathing, nonlabored ventilation, respiratory function stable and patient connected to nasal cannula oxygen ?Cardiovascular status: blood pressure returned to baseline and stable ?Postop Assessment: no apparent nausea or vomiting ?Anesthetic complications: no ? ? ?No notable events documented. ? ?Last Vitals:  ?Vitals:  ? 10/27/21 1200 10/27/21 1208  ?BP:  (!) 142/72  ?Pulse: 73 73  ?Resp: 12 15  ?Temp:  36.5 ?C  ?SpO2: 93% 93%  ?  ?Last Pain:  ?Vitals:  ? 10/27/21 1142  ?TempSrc:   ?PainSc: Asleep  ? ? ?  ?  ?  ?  ?  ?  ? ?Jakhari Space P Enna Warwick ? ? ? ? ?

## 2021-10-29 LAB — SURGICAL PATHOLOGY

## 2021-10-31 ENCOUNTER — Encounter: Payer: Self-pay | Admitting: *Deleted

## 2021-10-31 NOTE — Progress Notes (Signed)
? ?Patient Care Team: ?[name], Standley Brooking, MD as PCP - General ?Franchot Gallo, MD (Urology) ?Mauro Kaufmann, RN as Oncology Nurse Navigator ?Rockwell Germany, RN as Oncology Nurse Navigator ? ?DIAGNOSIS:  ?Encounter Diagnosis  ?Name Primary?  ? Malignant neoplasm of upper-inner quadrant of right breast in female, estrogen receptor positive ([place])   ? ? ?SUMMARY OF ONCOLOGIC HISTORY: ?Oncology History  ?Malignant neoplasm of upper-inner quadrant of right breast in female, estrogen receptor positive ([place])  ?09/26/2021 Initial Diagnosis  ? Screening mammogram detected right breast distortion indeterminate size, 2 o'clock position biopsy revealed invasive lobular cancer ER 95%, PR 5%, Ki-67 5%, HER2 1+ negative ?  ? ? ?CHIEF COMPLIANT: Discuss Pathology report. ? ?INTERVAL HISTORY: [name] [name] is a  88 y.o. female is here because of recent diagnosis of right  breast Invasive mammary carcinoma. She presents to the clinic today to discuss pathology report and follow-up.  She has recovered very well from the recent surgery and is accompanied by her daughter.  Unfortunately the surgical margins are not clean and she will need to go for another surgery. ? ? ?ALLERGIES:  is allergic to nitrofurantoin, other, and sulfamethoxazole. ? ?MEDICATIONS:  ?Current Outpatient Medications  ?Medication Sig Dispense Refill  ? amLODipine (NORVASC) 5 MG tablet Take 1 tablet by mouth once daily 90 tablet 0  ? Ascorbic Acid (VITAMIN C) 500 MG tablet Take 500 mg by mouth 2 (two) times daily.     ? Calcium Carb-Cholecalciferol 600-800 MG-UNIT TABS Take 1 tablet by mouth daily.    ? loratadine (CLARITIN) 10 MG tablet Take 10 mg by mouth daily as needed for allergies.    ? MELATONIN PO Take 1 tablet by mouth at bedtime as needed (sleep).    ? Multiple Vitamins-Minerals (CENTRUM SILVER PO) Take 1 tablet by mouth daily.    ? oxyCODONE (ROXICODONE) 5 MG immediate release tablet Take 1 tablet (5 mg total) by mouth every 6 (six) hours as needed  for severe pain. 10 tablet 0  ? ?No current facility-administered medications for this visit.  ? ? ?PHYSICAL EXAMINATION: ?ECOG PERFORMANCE STATUS: 2 - Symptomatic, <50% confined to bed ? ?Vitals:  ? 11/03/21 1537  ?BP: 110/79  ?Pulse: 75  ?Resp: 18  ?Temp: (!) 97.3 ?F (36.3 ?C)  ?SpO2: 100%  ? ?Filed Weights  ? 11/03/21 1537  ?Weight: 120 lb 1.6 oz (54.5 kg)  ? ?  ? ?LABORATORY DATA:  ?I have reviewed the data as listed ? ?  Latest Ref Rng & Units 10/24/2021  ? 12:00 PM 03/18/2021  ? 11:50 AM 06/12/2020  ? 11:07 AM  ?CMP  ?Glucose 70 - 99 mg/dL 103   96     ?BUN 8 - 23 mg/dL 26   19     ?Creatinine 0.44 - 1.00 mg/dL 0.98   0.92     ?Sodium 135 - 145 mmol/L 142   141     ?Potassium 3.5 - 5.1 mmol/L 4.3   4.2     ?Chloride 98 - 111 mmol/L 113   106     ?CO2 22 - 32 mmol/L 23   25     ?Calcium 8.9 - 10.3 mg/dL 8.8   9.2     ?Total Protein 6.0 - 8.3 g/dL  6.7   6.6    ?Total Bilirubin 0.2 - 1.2 mg/dL  0.8   0.7    ?Alkaline Phos 39 - 117 U/L  67     ?AST 0 - 37  U/L  23   22    ?ALT 0 - 35 U/L  26   22    ? ? ?Lab Results  ?Component Value Date  ? WBC 6.2 10/24/2021  ? HGB 14.1 10/24/2021  ? HCT 43.5 10/24/2021  ? MCV 96.9 10/24/2021  ? PLT 152 10/24/2021  ? NEUTROABS 3.9 03/18/2021  ? ? ?ASSESSMENT & PLAN:  ?Malignant neoplasm of upper-inner quadrant of right breast in female, estrogen receptor positive ([place]) ?09/26/2021:Screening mammogram detected right breast distortion indeterminate size, 2 o'clock position biopsy revealed invasive lobular cancer ER 95%, PR 5%, Ki-67 5%, HER2 1+ negative ?(On the biopsy the size of the invasive cancer was 7 mm) ? ?10/27/2021: Right lumpectomy: Grade 2 ILC 2.3 cm, margins negative, ER 95%, PR 5%, HER2 negative 1+, Ki-67 5% ? ?Pathology counseling: I discussed the final pathology report of the patient provided  a copy of this report. I discussed the margins as well as lymph node surgeries. We also discussed the final staging along with previously performed ER/PR and HER-2/neu  testing. ? ?Treatment plan: I discussed with the patient 3 options for positive margins ?Reresection of the margins  ?Mastectomy  ?adjuvant antiestrogen therapy with letrozole 2.5 mg daily (not curative option) ? ?Letrozole counseling: We discussed the risks and benefits of anti-estrogen therapy with aromatase inhibitors. These include but not limited to insomnia, hot flashes, mood changes, vaginal dryness, bone density loss, and weight gain. We strongly believe that the benefits far outweigh the risks. Patient understands these risks and consented to starting treatment. Planned treatment duration is 5 years. ? ?Dr. Marlou Starks will see her for an appointment and then discuss the plan. ?Return to clinic to discuss starting antiestrogen therapy. ? ? ? ?No orders of the defined types were placed in this encounter. ? ?The patient has a good understanding of the overall plan. she agrees with it. she will call with any problems that may develop before the next visit here. ?Total time spent: 30 mins including face to face time and time spent for planning, charting and co-ordination of care ? ? Harriette Ohara, MD ?11/03/21 ? ? ? I Gardiner Coins am scribing for Dr. Lindi Adie ? ?I have reviewed the above documentation for accuracy and completeness, and I agree with the above. ?  ?

## 2021-11-03 ENCOUNTER — Other Ambulatory Visit: Payer: Self-pay

## 2021-11-03 ENCOUNTER — Inpatient Hospital Stay: Payer: Medicare Other | Attending: Hematology and Oncology | Admitting: Hematology and Oncology

## 2021-11-03 DIAGNOSIS — C50211 Malignant neoplasm of upper-inner quadrant of right female breast: Secondary | ICD-10-CM | POA: Diagnosis not present

## 2021-11-03 DIAGNOSIS — Z170 Estrogen receptor positive status [ER+]: Secondary | ICD-10-CM | POA: Insufficient documentation

## 2021-11-03 DIAGNOSIS — Z79899 Other long term (current) drug therapy: Secondary | ICD-10-CM | POA: Diagnosis not present

## 2021-11-03 NOTE — Assessment & Plan Note (Signed)
09/26/2021:Screening mammogram detected right breast distortion indeterminate size, 2 o'clock position biopsy revealed invasive lobular cancer ER 95%, PR 5%, Ki-67 5%, HER2 1+ negative ?(On the biopsy the size of the invasive cancer was 7 mm) ? ?10/27/2021: Right lumpectomy: Grade 2 ILC 2.3 cm, margins negative, ER 95%, PR 5%, HER2 negative 1+, Ki-67 5% ? ?Pathology counseling: I discussed the final pathology report of the patient provided  a copy of this report. I discussed the margins as well as lymph node surgeries. We also discussed the final staging along with previously performed ER/PR and HER-2/neu testing. ? ?Treatment plan: Adjuvant antiestrogen therapy with letrozole 2.5 mg daily ? ?Letrozole counseling: We discussed the risks and benefits of anti-estrogen therapy with aromatase inhibitors. These include but not limited to insomnia, hot flashes, mood changes, vaginal dryness, bone density loss, and weight gain. We strongly believe that the benefits far outweigh the risks. Patient understands these risks and consented to starting treatment. Planned treatment duration is 5 years. ? ?Return to clinic in 3 months for survivorship care plan visit ? ?

## 2021-11-05 ENCOUNTER — Encounter (HOSPITAL_COMMUNITY): Payer: Self-pay

## 2021-11-07 ENCOUNTER — Encounter: Payer: Self-pay | Admitting: *Deleted

## 2021-11-11 ENCOUNTER — Encounter: Payer: Self-pay | Admitting: *Deleted

## 2021-11-18 ENCOUNTER — Telehealth: Payer: Self-pay | Admitting: *Deleted

## 2021-11-18 NOTE — Telephone Encounter (Signed)
Left message on VM for a return phone call regarding prescription for letrozole since patient will not have any further sx. ?

## 2021-11-19 ENCOUNTER — Encounter: Payer: Self-pay | Admitting: *Deleted

## 2021-11-19 ENCOUNTER — Other Ambulatory Visit: Payer: Self-pay | Admitting: *Deleted

## 2021-11-19 ENCOUNTER — Telehealth: Payer: Self-pay | Admitting: *Deleted

## 2021-11-19 MED ORDER — LETROZOLE 2.5 MG PO TABS: 2.5000 mg | ORAL_TABLET | Freq: Every day | ORAL | 3 refills | Status: DC

## 2021-11-19 NOTE — Telephone Encounter (Signed)
Spoke with patient to follow up to see if she is willing to take letrozole.  She does not want to pursue radiation or more surgery.  She states she will take the letrozole so I have let her know I will send this to the pharmacy.  Informed her she would get a call to schedule a follow up Dr. Lindi Adie. Patient verbalized understanding.  ?

## 2021-12-08 ENCOUNTER — Telehealth: Payer: Self-pay | Admitting: *Deleted

## 2021-12-08 NOTE — Telephone Encounter (Signed)
Received call from pt requesting advice from MD if okay to proceed with shingles vaccination.  Per MD okay for pt to proceed.  Pt educated and verbalized understanding.  ?

## 2021-12-15 ENCOUNTER — Telehealth: Payer: Self-pay | Admitting: Internal Medicine

## 2021-12-15 NOTE — Telephone Encounter (Signed)
Patient needs amLODipine (NORVASC) 5 MG tablet  to go to  ? ?[facility] [place], [address] AT [address] [place] Phone:  [phone]  ?Fax:  [phone]  ?  ? ?Would like a 90 day supply ? ?Medicare has stated they will no longer pay for prescriptions if they do not go to the preferred pharmacy which is [facility]. ? ? ? ? ? ?Please advise  ?

## 2021-12-16 MED ORDER — AMLODIPINE BESYLATE 5 MG PO TABS: 5.0000 mg | ORAL_TABLET | Freq: Every day | ORAL | 0 refills | Status: DC

## 2021-12-16 NOTE — Telephone Encounter (Signed)
Rx sent to the pharmacy.

## 2022-02-06 ENCOUNTER — Telehealth: Payer: Self-pay | Admitting: Internal Medicine

## 2022-02-06 MED ORDER — AMLODIPINE BESYLATE 5 MG PO TABS: 5.0000 mg | ORAL_TABLET | Freq: Every day | ORAL | 0 refills | Status: DC

## 2022-02-06 NOTE — Telephone Encounter (Signed)
Rx sent to the pharmacy.

## 2022-02-06 NOTE — Telephone Encounter (Signed)
Pt call and stated she need a refill on her amLODipine (NORVASC) 5 MG tablet sent to  [facility], [address] AT [place] Phone:  [phone]  Fax:  [phone]

## 2022-02-10 DIAGNOSIS — C50211 Malignant neoplasm of upper-inner quadrant of right female breast: Secondary | ICD-10-CM | POA: Diagnosis not present

## 2022-02-10 DIAGNOSIS — Z170 Estrogen receptor positive status [ER+]: Secondary | ICD-10-CM | POA: Diagnosis not present

## 2022-02-19 ENCOUNTER — Inpatient Hospital Stay: Payer: Medicare Other | Attending: Hematology and Oncology | Admitting: Hematology and Oncology

## 2022-02-19 DIAGNOSIS — Z79811 Long term (current) use of aromatase inhibitors: Secondary | ICD-10-CM | POA: Diagnosis not present

## 2022-02-19 DIAGNOSIS — Z79899 Other long term (current) drug therapy: Secondary | ICD-10-CM | POA: Insufficient documentation

## 2022-02-19 DIAGNOSIS — Z170 Estrogen receptor positive status [ER+]: Secondary | ICD-10-CM | POA: Diagnosis not present

## 2022-02-19 DIAGNOSIS — C50211 Malignant neoplasm of upper-inner quadrant of right female breast: Secondary | ICD-10-CM | POA: Insufficient documentation

## 2022-02-19 DIAGNOSIS — L659 Nonscarring hair loss, unspecified: Secondary | ICD-10-CM | POA: Diagnosis not present

## 2022-02-19 NOTE — Progress Notes (Signed)
Patient Care Team: [name], Standley Brooking, MD as PCP - General Franchot Gallo, MD (Urology) Mauro Kaufmann, RN as Oncology Nurse Navigator Rockwell Germany, RN as Oncology Nurse Navigator  DIAGNOSIS:  Encounter Diagnosis  Name Primary?   Malignant neoplasm of upper-inner quadrant of right breast in female, estrogen receptor positive ([place])     SUMMARY OF ONCOLOGIC HISTORY: Oncology History  Malignant neoplasm of upper-inner quadrant of right breast in female, estrogen receptor positive ([place])  09/26/2021 Initial Diagnosis   Screening mammogram detected right breast distortion indeterminate size, 2 o'clock position biopsy revealed invasive lobular cancer ER 95%, PR 5%, Ki-67 5%, HER2 1+ negative     CHIEF COMPLIANT: Follow-up letrozole  INTERVAL HISTORY: [name] [name] is a 88 y.o. female is here because of recent diagnosis of right  breast Invasive mammary carcinoma. She presents to the clinic today for a follow-up. She stop taking the letrozole for 5 days because she felt like it was having her hair thinning. But she has start back taking it. She states that she doesn't drink enough water.   ALLERGIES:  is allergic to nitrofurantoin, other, and sulfamethoxazole.  MEDICATIONS:  Current Outpatient Medications  Medication Sig Dispense Refill   amLODipine (NORVASC) 5 MG tablet Take 1 tablet (5 mg total) by mouth daily. 90 tablet 0   Ascorbic Acid (VITAMIN C) 500 MG tablet Take 500 mg by mouth 2 (two) times daily.      Calcium Carb-Cholecalciferol 600-800 MG-UNIT TABS Take 1 tablet by mouth daily.     letrozole (FEMARA) 2.5 MG tablet Take 1 tablet (2.5 mg total) by mouth daily. 90 tablet 3   loratadine (CLARITIN) 10 MG tablet Take 10 mg by mouth daily as needed for allergies.     MELATONIN PO Take 1 tablet by mouth at bedtime as needed (sleep).     Multiple Vitamins-Minerals (CENTRUM SILVER PO) Take 1 tablet by mouth daily.     No current facility-administered medications for this  visit.    PHYSICAL EXAMINATION: ECOG PERFORMANCE STATUS: 1 - Symptomatic but completely ambulatory  Vitals:   02/19/22 1345  BP: 118/67  Pulse: 73  Resp: 17  Temp: 98.2 F (36.8 C)  SpO2: 98%   Filed Weights   02/19/22 1345  Weight: 119 lb 9.6 oz (54.3 kg)      LABORATORY DATA:  I have reviewed the data as listed    Latest Ref Rng & Units 10/24/2021   12:00 PM 03/18/2021   11:50 AM 06/12/2020   11:07 AM  CMP  Glucose 70 - 99 mg/dL 103  96    BUN 8 - 23 mg/dL 26  19    Creatinine 0.44 - 1.00 mg/dL 0.98  0.92    Sodium 135 - 145 mmol/L 142  141    Potassium 3.5 - 5.1 mmol/L 4.3  4.2    Chloride 98 - 111 mmol/L 113  106    CO2 22 - 32 mmol/L 23  25    Calcium 8.9 - 10.3 mg/dL 8.8  9.2    Total Protein 6.0 - 8.3 g/dL  6.7  6.6   Total Bilirubin 0.2 - 1.2 mg/dL  0.8  0.7   Alkaline Phos 39 - 117 U/L  67    AST 0 - 37 U/L  23  22   ALT 0 - 35 U/L  26  22     Lab Results  Component Value Date   WBC 6.2 10/24/2021   HGB 14.1 10/24/2021  HCT 43.5 10/24/2021   MCV 96.9 10/24/2021   PLT 152 10/24/2021   NEUTROABS 3.9 03/18/2021    ASSESSMENT & PLAN:  Malignant neoplasm of upper-inner quadrant of right breast in female, estrogen receptor positive ([place]) 09/26/2021:Screening mammogram detected right breast distortion indeterminate size, 2 o'clock position biopsy revealed invasive lobular cancer ER 95%, PR 5%, Ki-67 5%, HER2 1+ negative (On the biopsy the size of the invasive cancer was 7 mm)   10/27/2021: Right lumpectomy: Grade 2 ILC 2.3 cm, margins negative, ER 95%, PR 5%, HER2 negative 1+, Ki-67 5%   Treatment plan: Adjuvant antiestrogen therapy with letrozole 2.5 mg daily (not curative option): She did not want to go through another surgery.   Letrozole toxicities: Hair thinning  We will obtain a mammogram in November on the right breast.  Return to clinic in 1 year for follow-up    Orders Placed This Encounter  Procedures   MM DIAG BREAST TOMO UNI RIGHT     Standing Status:   Future    Standing Expiration Date:   02/20/2023    Order Specific Question:   Reason for Exam (SYMPTOM  OR DIAGNOSIS REQUIRED)    Answer:   RIght breast mammogram with H/O breast cancer    Order Specific Question:   Preferred imaging location?    Answer:   [facility]   The patient has a good understanding of the overall plan. she agrees with it. she will call with any problems that may develop before the next visit here. Total time spent: 30 mins including face to face time and time spent for planning, charting and co-ordination of care   Harriette Ohara, MD 02/19/22    I Gardiner Coins am scribing for Dr. Lindi Adie  I have reviewed the above documentation for accuracy and completeness, and I agree with the above.

## 2022-02-19 NOTE — Assessment & Plan Note (Addendum)
09/26/2021:Screening mammogram detected right breast distortion indeterminate size, 2 o'clock position biopsy revealed invasive lobular cancer ER 95%, PR 5%, Ki-67 5%, HER2 1+ negative (On the biopsy the size of the invasive cancer was 7 mm)  10/27/2021: Right lumpectomy: Grade 2 ILC 2.3 cm, margins negative, ER 95%, PR 5%, HER2 negative 1+, Ki-67 5%  1. Treatment plan: Adjuvant antiestrogen therapy with letrozole 2.5 mg daily (not curative option): She did not want to go through another surgery.  Letrozole toxicities: Hair thinning  We will obtain a mammogram in November on the right breast.  Return to clinic in 1 year for follow-up

## 2022-03-06 DIAGNOSIS — H1013 Acute atopic conjunctivitis, bilateral: Secondary | ICD-10-CM | POA: Diagnosis not present

## 2022-05-19 NOTE — Progress Notes (Unsigned)
No chief complaint on file.   HPI: Patient  [name] [name]  88 y.o. comes in today for Preventive Health Care visit   Health Maintenance  Topic Date Due   COVID-19 Vaccine (5 - Pfizer risk series) 07/29/2021   Zoster Vaccines- Shingrix (2 of 2) 08/27/2021   INFLUENZA VACCINE  03/03/2022   DEXA SCAN  09/23/2022 (Originally 09/27/1998)   TETANUS/TDAP  08/03/2027   Pneumonia Vaccine 68+ Years old  Completed   HPV VACCINES  Aged Out   Health Maintenance Review LIFESTYLE:  Exercise:   Tobacco/ETS: Alcohol:  Sugar beverages: Sleep: Drug use: no HH of  Work:    ROS:  REST of 12 system review negative except as per HPI   Past Medical History:  Diagnosis Date   Adenomatous colon polyp     4 2008   Breast cancer ([place]) 1978   left breast   Complication of anesthesia    Cystitis, interstitial    FRACTURE, ANKLE, LEFT 12/14/2006   Qualifier: Diagnosis of  By: Hulan Saas, CMA (AAMA), Shannon S    FRACTURE, WRIST 12/14/2006   Qualifier: Diagnosis of  By: Hulan Saas, CMA (AAMA), Quita Skye    Hearing loss    does not use hearing aids   Hyperlipidemia    Hypertension    Osteoarthritis    PONV (postoperative nausea and vomiting)    Episode after Ether   Rotator cuff tear, right    Shingles    Urinary incontinence     Past Surgical History:  Procedure Laterality Date   ABDOMINAL HYSTERECTOMY  08/03/1986   fibroids   BREAST LUMPECTOMY WITH RADIOACTIVE SEED LOCALIZATION Right 10/27/2021   Procedure: RIGHT BREAST LUMPECTOMY WITH RADIOACTIVE SEED LOCALIZATION;  Surgeon: Jovita Kussmaul, MD;  Location: [facility];  Service: General;  Laterality: Right;   COLONOSCOPY     DILATION AND CURETTAGE OF UTERUS     x 2   SHOULDER ARTHROSCOPY WITH DISTAL CLAVICLE RESECTION Right 12/23/2017   Procedure: SHOULDER ARTHROSCOPY WITH DISTAL CLAVICLE RESECTION;  Surgeon: Justice Britain, MD;  Location: [facility];  Service: Orthopedics;  Laterality: Right;   SHOULDER ARTHROSCOPY WITH ROTATOR CUFF REPAIR  AND SUBACROMIAL DECOMPRESSION Right 12/23/2017   Procedure: SHOULDER ARTHROSCOPY WITH ROTATOR CUFF REPAIR AND SUBACROMIAL DECOMPRESSION, distal clavicle resection;  Surgeon: Justice Britain, MD;  Location: [facility];  Service: Orthopedics;  Laterality: Right;   TONSILLECTOMY     TUBAL LIGATION  08/03/1973   WISDOM TOOTH EXTRACTION     upper wisdom teeth only ext    Family History  Problem Relation Age of Onset   Suicidality Mother    Heart attack Father    Cancer - Other Daughter        throat cancer    Thyroid disease Neg Hx     Social History   Socioeconomic History   Marital status: Married    Spouse name: Not on file   Number of children: 3   Years of education: Not on file   Highest education level: Not on file  Occupational History    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  Tobacco Use   Smoking status: Former    Packs/day: 0.25    Years: 15.00    Total pack years: 3.75    Types: Cigarettes   Smokeless tobacco: Never   Tobacco comments:    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 not interested in caregiver or respite support   Enjoys reading, walking around the house.   Social Determinants of Health   Financial Resource Strain: Low Risk  (09/23/2021)   Overall Financial Resource Strain (CARDIA)    Difficulty of Paying Living Expenses: Not hard at all  Food Insecurity: No Food Insecurity  (09/23/2021)   Hunger Vital Sign    Worried About Running Out of Food in the Last Year: Never true    Ran Out of Food in the Last Year: Never true  Transportation Needs: No Transportation Needs (09/23/2021)   PRAPARE - Hydrologist (Medical): No    Lack of Transportation (Non-Medical): No  Physical Activity: Inactive (09/23/2021)   Exercise Vital Sign    Days of Exercise per Week: 0 days    Minutes of Exercise per Session: 0 min  Stress: Stress Concern Present (09/23/2021)   [place]    Feeling of Stress : To some extent  Social Connections: Unknown (09/19/2019)   Social Connection and Isolation Panel [NHANES]    Frequency of Communication with Friends and Family: More than three times a week    Frequency of Social Gatherings with Friends and Family: Once a week    Attends Religious Services: Not on Advertising copywriter or Organizations: Not on file    Attends Archivist Meetings: Not on file    Marital Status: Married    Outpatient Medications Prior to Visit  Medication Sig Dispense Refill   amLODipine (NORVASC) 5 MG tablet Take 1 tablet (5 mg total) by mouth daily. 90 tablet 0   Ascorbic Acid (VITAMIN C) 500 MG tablet Take 500 mg by mouth 2 (two) times daily.      Calcium Carb-Cholecalciferol 600-800 MG-UNIT TABS Take 1 tablet by mouth daily.     letrozole (FEMARA) 2.5 MG tablet Take 1 tablet (2.5 mg total) by mouth daily. 90 tablet 3   loratadine (CLARITIN) 10 MG tablet Take 10 mg by mouth daily as needed for allergies.     MELATONIN PO Take 1 tablet by mouth at bedtime as needed (sleep).     Multiple Vitamins-Minerals (CENTRUM SILVER PO) Take 1 tablet by mouth daily.     No facility-administered medications prior to visit.     EXAM:  There were no vitals taken for this visit.  There is no height or weight on file to calculate BMI. Wt Readings from Last 3  Encounters:  02/19/22 119 lb 9.6 oz (54.3 kg)  11/03/21 120 lb 1.6 oz (54.5 kg)  10/27/21 120 lb (54.4 kg)    Physical Exam: Vital signs reviewed WVP:XTGG is a well-developed well-nourished alert cooperative    who appearsr stated age in no acute distress.  HEENT: normocephalic atraumatic , Eyes: PERRL  EOM's full, conjunctiva clear, Nares: paten,t no deformity discharge or tenderness., Ears: no deformity EAC's clear TMs with normal landmarks. Mouth: clear OP, no lesions, edema.  Moist mucous membranes. Dentition in adequate repair. NECK: supple without masses, thyromegaly or bruits. CHEST/PULM:  Clear to auscultation and percussion breath sounds equal no wheeze , rales or rhonchi. No chest wall deformities or tenderness. Breast: CV: PMI is nondisplaced, S1 S2 no gallops, murmurs, rubs. Peripheral pulses are full without delay.No JVD .  ABDOMEN: Bowel sounds normal nontender  No guard or rebound, no hepato splenomegal no CVA tenderness.  No hernia. Extremtities:  No clubbing cyanosis or edema, no acute joint swelling or redness no focal atrophy NEURO:  Oriented x3, cranial nerves 3-12 appear to be intact, no obvious focal weakness,gait within normal limits no abnormal reflexes or asymmetrical SKIN: No acute rashes normal turgor, color, no bruising or petechiae. PSYCH: Oriented, good eye contact, no obvious depression anxiety, cognition and judgment appear normal. LN: no cervical axillary inguinal adenopathy  Lab Results  Component Value Date   WBC 6.2 10/24/2021   HGB 14.1 10/24/2021   HCT 43.5 10/24/2021   PLT 152 10/24/2021   GLUCOSE 103 (H) 10/24/2021   CHOL 213 (H) 03/18/2021   TRIG 143.0 03/18/2021   HDL 56.10 03/18/2021   LDLDIRECT 165.0 12/07/2006   LDLCALC 129 (H) 03/18/2021   ALT 26 03/18/2021   AST 23 03/18/2021   NA 142 10/24/2021   K 4.3 10/24/2021   CL 113 (H) 10/24/2021   CREATININE 0.98 10/24/2021   BUN 26 (H) 10/24/2021   CO2 23 10/24/2021   TSH 1.90 03/18/2021    HGBA1C 6.0 03/18/2021    BP Readings from Last 3 Encounters:  02/19/22 118/67  11/03/21 110/79  10/27/21 (!) 142/72    Lab results reviewed with patient   ASSESSMENT AND PLAN:  Discussed the following assessment and plan:    ICD-10-CM   1. Visit for preventive health examination  Z00.00     2. Fasting hyperglycemia  R73.01     3. History of breast cancer  Z85.3     4. Essential hypertension  I10     5. Medication management  Z79.899     6. Hyperlipidemia, unspecified hyperlipidemia type  E78.5      No follow-ups on file.  Patient Care Team: Shogo Larkey, Standley Brooking, MD as PCP - General Dahlstedt, Annie Main, MD (Urology) Mauro Kaufmann, RN as Oncology Nurse Navigator Rockwell Germany, RN as Oncology Nurse Navigator There are no Patient Instructions on file for this visit.  Standley Brooking. Raia Amico M.D.

## 2022-05-20 ENCOUNTER — Ambulatory Visit (INDEPENDENT_AMBULATORY_CARE_PROVIDER_SITE_OTHER): Payer: Medicare Other | Admitting: Internal Medicine

## 2022-05-20 ENCOUNTER — Encounter: Payer: Self-pay | Admitting: Internal Medicine

## 2022-05-20 VITALS — BP 122/62 | HR 58 | Temp 97.4°F | Ht 62.0 in | Wt 117.6 lb

## 2022-05-20 DIAGNOSIS — Z9012 Acquired absence of left breast and nipple: Secondary | ICD-10-CM

## 2022-05-20 DIAGNOSIS — R5383 Other fatigue: Secondary | ICD-10-CM

## 2022-05-20 DIAGNOSIS — Z23 Encounter for immunization: Secondary | ICD-10-CM

## 2022-05-20 DIAGNOSIS — Z79899 Other long term (current) drug therapy: Secondary | ICD-10-CM | POA: Diagnosis not present

## 2022-05-20 DIAGNOSIS — C50911 Malignant neoplasm of unspecified site of right female breast: Secondary | ICD-10-CM | POA: Diagnosis not present

## 2022-05-20 DIAGNOSIS — I10 Essential (primary) hypertension: Secondary | ICD-10-CM

## 2022-05-20 DIAGNOSIS — Z0000 Encounter for general adult medical examination without abnormal findings: Secondary | ICD-10-CM

## 2022-05-20 DIAGNOSIS — R7301 Impaired fasting glucose: Secondary | ICD-10-CM | POA: Diagnosis not present

## 2022-05-20 DIAGNOSIS — Z853 Personal history of malignant neoplasm of breast: Secondary | ICD-10-CM

## 2022-05-20 DIAGNOSIS — E785 Hyperlipidemia, unspecified: Secondary | ICD-10-CM | POA: Diagnosis not present

## 2022-05-20 LAB — CBC WITH DIFFERENTIAL/PLATELET
Basophils Absolute: 0 10*3/uL (ref 0.0–0.1)
Basophils Relative: 0.7 % (ref 0.0–3.0)
Eosinophils Absolute: 0.1 10*3/uL (ref 0.0–0.7)
Eosinophils Relative: 1.8 % (ref 0.0–5.0)
HCT: 44.8 % (ref 36.0–46.0)
Hemoglobin: 14.8 g/dL (ref 12.0–15.0)
Lymphocytes Relative: 26.2 % (ref 12.0–46.0)
Lymphs Abs: 1.4 10*3/uL (ref 0.7–4.0)
MCHC: 33 g/dL (ref 30.0–36.0)
MCV: 95.6 fl (ref 78.0–100.0)
Monocytes Absolute: 0.6 10*3/uL (ref 0.1–1.0)
Monocytes Relative: 11 % (ref 3.0–12.0)
Neutro Abs: 3.2 10*3/uL (ref 1.4–7.7)
Neutrophils Relative %: 60.3 % (ref 43.0–77.0)
Platelets: 155 10*3/uL (ref 150.0–400.0)
RBC: 4.69 Mil/uL (ref 3.87–5.11)
RDW: 13 % (ref 11.5–15.5)
WBC: 5.3 10*3/uL (ref 4.0–10.5)

## 2022-05-20 LAB — LIPID PANEL
Cholesterol: 217 mg/dL — ABNORMAL HIGH (ref 0–200)
HDL: 63.6 mg/dL (ref >39.00)
LDL Cholesterol: 135 mg/dL — ABNORMAL HIGH (ref 0–99)
NonHDL: 153.21
Total CHOL/HDL Ratio: 3
Triglycerides: 89 mg/dL (ref 0.0–149.0)
VLDL: 17.8 mg/dL (ref 0.0–40.0)

## 2022-05-20 LAB — HEPATIC FUNCTION PANEL
ALT: 31 U/L (ref 0–35)
AST: 28 U/L (ref 0–37)
Albumin: 4.5 g/dL (ref 3.5–5.2)
Alkaline Phosphatase: 63 U/L (ref 39–117)
Bilirubin, Direct: 0.2 mg/dL (ref 0.0–0.3)
Total Bilirubin: 0.9 mg/dL (ref 0.2–1.2)
Total Protein: 7.2 g/dL (ref 6.0–8.3)

## 2022-05-20 LAB — BASIC METABOLIC PANEL
BUN: 28 mg/dL — ABNORMAL HIGH (ref 6–23)
CO2: 28 mEq/L (ref 19–32)
Calcium: 9.4 mg/dL (ref 8.4–10.5)
Chloride: 104 mEq/L (ref 96–112)
Creatinine, Ser: 0.9 mg/dL (ref 0.40–1.20)
GFR: 57.02 mL/min — ABNORMAL LOW (ref >60.00)
Glucose, Bld: 105 mg/dL — ABNORMAL HIGH (ref 70–99)
Potassium: 4.4 mEq/L (ref 3.5–5.1)
Sodium: 140 mEq/L (ref 135–145)

## 2022-05-20 LAB — TSH: TSH: 1.56 u[IU]/mL (ref 0.35–5.50)

## 2022-05-20 LAB — VITAMIN D 25 HYDROXY (VIT D DEFICIENCY, FRACTURES): VITD: 55.64 ng/mL (ref 30.00–100.00)

## 2022-05-20 LAB — HEMOGLOBIN A1C: Hgb A1c MFr Bld: 6.3 % (ref 4.6–6.5)

## 2022-05-20 NOTE — Progress Notes (Signed)
Cholesterol about the same slightly up but favorable ratio.  Kidney function stable, vitamin D and acceptable range 55, thyroid liver and blood count all normal Hemoglobin A1c 6.3 borderline up No change in  plans . Contact patient about results

## 2022-05-20 NOTE — Patient Instructions (Addendum)
Good to see you today . Updated labs . Today  Including vit d level.   Suggest  consider RSV vaccine .  Fall prevention optimize bone health.

## 2022-07-03 DIAGNOSIS — M25532 Pain in left wrist: Secondary | ICD-10-CM | POA: Diagnosis not present

## 2022-07-06 DIAGNOSIS — S52509A Unspecified fracture of the lower end of unspecified radius, initial encounter for closed fracture: Secondary | ICD-10-CM

## 2022-07-06 DIAGNOSIS — S52502A Unspecified fracture of the lower end of left radius, initial encounter for closed fracture: Secondary | ICD-10-CM | POA: Diagnosis not present

## 2022-07-06 DIAGNOSIS — M25532 Pain in left wrist: Secondary | ICD-10-CM | POA: Diagnosis not present

## 2022-07-06 HISTORY — DX: Unspecified fracture of the lower end of unspecified radius, initial encounter for closed fracture: S52.509A

## 2022-07-15 DIAGNOSIS — S52502A Unspecified fracture of the lower end of left radius, initial encounter for closed fracture: Secondary | ICD-10-CM | POA: Diagnosis not present

## 2022-07-15 DIAGNOSIS — M25532 Pain in left wrist: Secondary | ICD-10-CM | POA: Diagnosis not present

## 2022-08-05 DIAGNOSIS — M25532 Pain in left wrist: Secondary | ICD-10-CM | POA: Diagnosis not present

## 2022-08-05 DIAGNOSIS — S52502A Unspecified fracture of the lower end of left radius, initial encounter for closed fracture: Secondary | ICD-10-CM | POA: Diagnosis not present

## 2022-08-07 ENCOUNTER — Ambulatory Visit
Admission: RE | Admit: 2022-08-07 | Discharge: 2022-08-07 | Disposition: A | Discharge status: Home / Self Care | Payer: Medicare Other | Source: Ambulatory Visit | Attending: Hematology and Oncology | Admitting: Hematology and Oncology

## 2022-08-07 DIAGNOSIS — R922 Inconclusive mammogram: Secondary | ICD-10-CM | POA: Diagnosis not present

## 2022-08-07 DIAGNOSIS — Z853 Personal history of malignant neoplasm of breast: Secondary | ICD-10-CM | POA: Diagnosis not present

## 2022-08-07 DIAGNOSIS — Z170 Estrogen receptor positive status [ER+]: Secondary | ICD-10-CM

## 2022-08-13 ENCOUNTER — Other Ambulatory Visit: Payer: Self-pay | Admitting: Internal Medicine

## 2022-09-09 DIAGNOSIS — M25532 Pain in left wrist: Secondary | ICD-10-CM | POA: Diagnosis not present

## 2022-09-09 DIAGNOSIS — S52502A Unspecified fracture of the lower end of left radius, initial encounter for closed fracture: Secondary | ICD-10-CM | POA: Diagnosis not present

## 2022-10-28 ENCOUNTER — Telehealth: Payer: Self-pay | Admitting: Internal Medicine

## 2022-10-28 NOTE — Telephone Encounter (Signed)
Seeking guidance as to which ortho she should go to for left wrist pain. Has the surgery at [facility]

## 2022-10-29 NOTE — Telephone Encounter (Signed)
Pt return the call. Relay Dr. Velora Mediate advise to pt. Verbalized understanding.

## 2022-10-29 NOTE — Telephone Encounter (Signed)
Attempted to reach pt. Mr. Rossing answered. He states it is best to talk to pt and have pt to contact us back.

## 2022-10-29 NOTE — Telephone Encounter (Signed)
I suggest she get fu appt with [facility] hand team  . For further opinion . ( They can order testing of nerves if needed)

## 2022-11-05 DIAGNOSIS — G5602 Carpal tunnel syndrome, left upper limb: Secondary | ICD-10-CM

## 2022-11-05 DIAGNOSIS — M79645 Pain in left finger(s): Secondary | ICD-10-CM | POA: Diagnosis not present

## 2022-11-05 HISTORY — DX: Carpal tunnel syndrome, left upper limb: G56.02

## 2022-11-10 ENCOUNTER — Other Ambulatory Visit: Payer: Self-pay | Admitting: Hematology and Oncology

## 2022-11-23 NOTE — Progress Notes (Signed)
Chief Complaint  Patient presents with   Medical Management of Chronic Issues   Numbness    Pt reports numbness, hurts when movement.     HPI: [name] [name] 89 y.o. come in for Chronic disease management  6 mos recheck   Ongoing numbness in left hand and left hand dominant   using r hand to eat  Cts has fu with [facility] frx nd ortho?  Hx breast cancer  current right  on letrozole .  X fracture   wrist  bone health Reluctant to add meds  Still "caretaking " husband active  BP amlodipine  no concerns ROS: See pertinent positives and negatives per HPI. No cp sosb syncope  still hard of hearing   Past Medical History:  Diagnosis Date   Adenomatous colon polyp     4 2008   Breast cancer ([facility]) 1978   left breast   Complication of anesthesia    Cystitis, interstitial    FRACTURE, ANKLE, LEFT 12/14/2006   Qualifier: Diagnosis of  By: Lawernce Ion, CMA (AAMA), Shannon S    FRACTURE, WRIST 12/14/2006   Qualifier: Diagnosis of  By: Lawernce Ion, CMA (AAMA), Bethann Berkshire    Hearing loss    does not use hearing aids   Hyperlipidemia    Hypertension    Osteoarthritis    PONV (postoperative nausea and vomiting)    Episode after Ether   Rotator cuff tear, right    Shingles    Urinary incontinence     Family History  Problem Relation Age of Onset   Suicidality Mother    Heart attack Father    Cancer - Other Daughter        throat cancer    Thyroid disease Neg Hx     Social History   Socioeconomic History   Marital status: Married    Spouse name: Not on file   Number of children: 3   Years of education: Not on file   Highest education level: Not on file  Occupational History    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  Tobacco Use   Smoking status: Former    Packs/day: 0.25    Years: 15.00    Additional pack years: 0.00    Total pack years: 3.75    Types: Cigarettes   Smokeless tobacco: Never   Tobacco comments:    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 not interested in caregiver or respite support   Enjoys reading, walking around the house.   Social Determinants of Health   Financial Resource Strain: Low Risk  (09/23/2021)   Overall Financial Resource Strain (CARDIA)    Difficulty of Paying Living Expenses: Not hard at all  Food Insecurity: No  Food Insecurity (09/23/2021)   Hunger Vital Sign    Worried About Running Out of Food in the Last Year: Never true    Ran Out of Food in the Last Year: Never true  Transportation Needs: No Transportation Needs (09/23/2021)   PRAPARE - Administrator, Civil Service (Medical): No    Lack of Transportation (Non-Medical): No  Physical Activity: Inactive (09/23/2021)   Exercise Vital Sign    Days of Exercise per Week: 0 days    Minutes of Exercise per Session: 0 min  Stress: Stress Concern Present (09/23/2021)   [employer] of Occupational Health - Occupational Stress Questionnaire    Feeling of Stress : To some extent  Social Connections: Unknown (09/19/2019)   Social Connection and Isolation Panel [NHANES]    Frequency of Communication with Friends and Family: More than three times a week    Frequency of Social Gatherings with Friends and Family: Once a week    Attends Religious Services: Not on Marketing executive or Organizations: Not on file    Attends Banker Meetings: Not on file     Marital Status: Married    Outpatient Medications Prior to Visit  Medication Sig Dispense Refill   amLODipine (NORVASC) 5 MG tablet TAKE 1 TABLET(5 MG) BY MOUTH DAILY 90 tablet 1   Ascorbic Acid (VITAMIN C) 500 MG tablet Take 500 mg by mouth 2 (two) times daily.      Calcium Carb-Cholecalciferol 600-800 MG-UNIT TABS Take 1 tablet by mouth daily.     letrozole (FEMARA) 2.5 MG tablet TAKE 1 TABLET(2.5 MG) BY MOUTH DAILY 90 tablet 3   loratadine (CLARITIN) 10 MG tablet Take 10 mg by mouth daily as needed for allergies.     MELATONIN PO Take 1 tablet by mouth at bedtime as needed (sleep).     Multiple Vitamins-Minerals (CENTRUM SILVER PO) Take 1 tablet by mouth daily.     No facility-administered medications prior to visit.     EXAM:  BP 130/64 (BP Location: Right Arm, Cuff Size: Normal)   Pulse (!) 40   Temp (!) 97.5 F (36.4 C) (Oral)   Ht 5\' 2"  (1.575 m)   Wt 119 lb 6.4 oz (54.2 kg)   SpO2 96%   BMI 21.84 kg/m   Body mass index is 21.84 kg/m. Wt Readings from Last 3 Encounters:  11/24/22 119 lb 6.4 oz (54.2 kg)  05/20/22 117 lb 9.6 oz (53.3 kg)  02/19/22 119 lb 9.6 oz (54.3 kg)    GENERAL: vitals reviewed and listed above, alert, oriented, appears well hydrated and in no acute distress hard of hearing. HEENT: atraumatic, conjunctiva  clear, no obvious abnormalities on inspection of external nose and ears NECK: no obvious masses on inspection palpation  LUNGS: clear to auscultation bilaterally, no wheezes, rales or rhonchi, good air movement CV: HRRR, no clubbing cyanosis or  peripheral edema nl cap refill  MS: moves all extremities without noticeable focal  abnormality djd changes hands  PSYCH: pleasant and cooperative, no obvious depression or anxiety Lab Results  Component Value Date   WBC 5.3 05/20/2022   HGB 14.8 05/20/2022   HCT 44.8 05/20/2022   PLT 155.0 05/20/2022   GLUCOSE 105 (H) 05/20/2022   CHOL 217 (H) 05/20/2022   TRIG 89.0 05/20/2022   HDL 63.60  05/20/2022   LDLDIRECT 165.0 12/07/2006   LDLCALC 135 (H) 05/20/2022   ALT 31 05/20/2022   AST 28 05/20/2022  NA 140 05/20/2022   K 4.4 05/20/2022   CL 104 05/20/2022   CREATININE 0.90 05/20/2022   BUN 28 (H) 05/20/2022   CO2 28 05/20/2022   TSH 1.56 05/20/2022   HGBA1C 5.6 11/24/2022   BP Readings from Last 3 Encounters:  11/24/22 130/64  05/20/22 122/62  02/19/22 118/67   No results found for: "VITAMINB12"  ASSESSMENT AND PLAN:  Discussed the following assessment and plan:  Essential hypertension  Fasting hyperglycemia - Plan: POC HgB A1c  Medication management  Malignant neoplasm of upper-inner quadrant of right breast in female, estrogen receptor positive (HCC)  History of fracture Reviewed fall risk   and fu . Bone health Bp in control  Risk benefit of meds  -Patient advised to return or notify health care team  if  new concerns arise.  Patient Instructions  Bp is good  A1c is now 5.3 normal... Attend to good nutrition and avoid weight loss.   Consider medication for bone building    Actonel   fosamax   Get in dental care .   Your last vit D level was good.  Continue weight  bearing exercise   to  optimize  bone strength .  Plan  6 mos  cpe  can do labs at that  visit.   Carry your phone with  you . Pants with pockets.      Neta Mends. Edit Ricciardelli M.D.

## 2022-11-24 ENCOUNTER — Ambulatory Visit (INDEPENDENT_AMBULATORY_CARE_PROVIDER_SITE_OTHER): Payer: Medicare Other | Admitting: Internal Medicine

## 2022-11-24 ENCOUNTER — Encounter: Payer: Self-pay | Admitting: Internal Medicine

## 2022-11-24 VITALS — BP 130/64 | HR 40 | Temp 97.5°F | Ht 62.0 in | Wt 119.4 lb

## 2022-11-24 DIAGNOSIS — C50211 Malignant neoplasm of upper-inner quadrant of right female breast: Secondary | ICD-10-CM | POA: Diagnosis not present

## 2022-11-24 DIAGNOSIS — I10 Essential (primary) hypertension: Secondary | ICD-10-CM

## 2022-11-24 DIAGNOSIS — R7301 Impaired fasting glucose: Secondary | ICD-10-CM

## 2022-11-24 DIAGNOSIS — Z170 Estrogen receptor positive status [ER+]: Secondary | ICD-10-CM

## 2022-11-24 DIAGNOSIS — Z79899 Other long term (current) drug therapy: Secondary | ICD-10-CM | POA: Diagnosis not present

## 2022-11-24 DIAGNOSIS — Z8781 Personal history of (healed) traumatic fracture: Secondary | ICD-10-CM

## 2022-11-24 LAB — POCT GLYCOSYLATED HEMOGLOBIN (HGB A1C): Hemoglobin A1C: 5.6 % (ref 4.0–5.6)

## 2022-11-24 NOTE — Patient Instructions (Addendum)
Bp is good  A1c is now 5.3 normal... Attend to good nutrition and avoid weight loss.   Consider medication for bone building    Actonel   fosamax   Get in dental care .   Your last vit D level was good.  Continue weight  bearing exercise   to  optimize  bone strength .  Plan  6 mos  cpe  can do labs at that  visit.   Carry your phone with  you . Pants with pockets.

## 2022-11-25 DIAGNOSIS — G5602 Carpal tunnel syndrome, left upper limb: Secondary | ICD-10-CM | POA: Diagnosis not present

## 2022-11-29 ENCOUNTER — Encounter: Payer: Self-pay | Admitting: Internal Medicine

## 2022-12-01 DIAGNOSIS — C50911 Malignant neoplasm of unspecified site of right female breast: Secondary | ICD-10-CM | POA: Diagnosis not present

## 2022-12-01 DIAGNOSIS — C50912 Malignant neoplasm of unspecified site of left female breast: Secondary | ICD-10-CM | POA: Diagnosis not present

## 2022-12-02 DIAGNOSIS — G5602 Carpal tunnel syndrome, left upper limb: Secondary | ICD-10-CM | POA: Diagnosis not present

## 2022-12-31 ENCOUNTER — Inpatient Hospital Stay (HOSPITAL_COMMUNITY)
Admission: EM | Admit: 2022-12-31 | Discharge: 2023-01-07 | DRG: 481 | Disposition: A | Discharge status: Rehabilitation Facility | Payer: Medicare Other | Attending: Internal Medicine | Admitting: Internal Medicine

## 2022-12-31 ENCOUNTER — Inpatient Hospital Stay (HOSPITAL_COMMUNITY): Payer: Medicare Other | Admitting: Anesthesiology

## 2022-12-31 ENCOUNTER — Emergency Department (HOSPITAL_COMMUNITY): Payer: Medicare Other

## 2022-12-31 ENCOUNTER — Other Ambulatory Visit: Payer: Self-pay

## 2022-12-31 ENCOUNTER — Encounter (HOSPITAL_COMMUNITY): Payer: Self-pay

## 2022-12-31 DIAGNOSIS — W19XXXA Unspecified fall, initial encounter: Secondary | ICD-10-CM | POA: Diagnosis not present

## 2022-12-31 DIAGNOSIS — Z881 Allergy status to other antibiotic agents status: Secondary | ICD-10-CM | POA: Diagnosis not present

## 2022-12-31 DIAGNOSIS — R7989 Other specified abnormal findings of blood chemistry: Secondary | ICD-10-CM | POA: Diagnosis not present

## 2022-12-31 DIAGNOSIS — Z853 Personal history of malignant neoplasm of breast: Secondary | ICD-10-CM

## 2022-12-31 DIAGNOSIS — Y92009 Unspecified place in unspecified non-institutional (private) residence as the place of occurrence of the external cause: Secondary | ICD-10-CM

## 2022-12-31 DIAGNOSIS — F05 Delirium due to known physiological condition: Secondary | ICD-10-CM | POA: Diagnosis not present

## 2022-12-31 DIAGNOSIS — S72144D Nondisplaced intertrochanteric fracture of right femur, subsequent encounter for closed fracture with routine healing: Secondary | ICD-10-CM | POA: Diagnosis not present

## 2022-12-31 DIAGNOSIS — Z79899 Other long term (current) drug therapy: Secondary | ICD-10-CM

## 2022-12-31 DIAGNOSIS — Z90710 Acquired absence of both cervix and uterus: Secondary | ICD-10-CM | POA: Diagnosis not present

## 2022-12-31 DIAGNOSIS — J811 Chronic pulmonary edema: Secondary | ICD-10-CM | POA: Diagnosis not present

## 2022-12-31 DIAGNOSIS — R739 Hyperglycemia, unspecified: Secondary | ICD-10-CM

## 2022-12-31 DIAGNOSIS — S72141A Displaced intertrochanteric fracture of right femur, initial encounter for closed fracture: Principal | ICD-10-CM | POA: Diagnosis present

## 2022-12-31 DIAGNOSIS — Z8249 Family history of ischemic heart disease and other diseases of the circulatory system: Secondary | ICD-10-CM

## 2022-12-31 DIAGNOSIS — M25551 Pain in right hip: Secondary | ICD-10-CM | POA: Diagnosis not present

## 2022-12-31 DIAGNOSIS — C50912 Malignant neoplasm of unspecified site of left female breast: Secondary | ICD-10-CM | POA: Diagnosis not present

## 2022-12-31 DIAGNOSIS — S42141A Displaced fracture of glenoid cavity of scapula, right shoulder, initial encounter for closed fracture: Secondary | ICD-10-CM | POA: Diagnosis not present

## 2022-12-31 DIAGNOSIS — E785 Hyperlipidemia, unspecified: Secondary | ICD-10-CM | POA: Diagnosis present

## 2022-12-31 DIAGNOSIS — R638 Other symptoms and signs concerning food and fluid intake: Secondary | ICD-10-CM | POA: Diagnosis not present

## 2022-12-31 DIAGNOSIS — R7303 Prediabetes: Secondary | ICD-10-CM | POA: Diagnosis not present

## 2022-12-31 DIAGNOSIS — Z66 Do not resuscitate: Secondary | ICD-10-CM | POA: Diagnosis present

## 2022-12-31 DIAGNOSIS — Z808 Family history of malignant neoplasm of other organs or systems: Secondary | ICD-10-CM

## 2022-12-31 DIAGNOSIS — R1011 Right upper quadrant pain: Secondary | ICD-10-CM | POA: Diagnosis not present

## 2022-12-31 DIAGNOSIS — Z79811 Long term (current) use of aromatase inhibitors: Secondary | ICD-10-CM

## 2022-12-31 DIAGNOSIS — S72141D Displaced intertrochanteric fracture of right femur, subsequent encounter for closed fracture with routine healing: Secondary | ICD-10-CM | POA: Diagnosis not present

## 2022-12-31 DIAGNOSIS — R9431 Abnormal electrocardiogram [ECG] [EKG]: Secondary | ICD-10-CM | POA: Diagnosis not present

## 2022-12-31 DIAGNOSIS — Z043 Encounter for examination and observation following other accident: Secondary | ICD-10-CM | POA: Diagnosis not present

## 2022-12-31 DIAGNOSIS — I10 Essential (primary) hypertension: Secondary | ICD-10-CM | POA: Diagnosis present

## 2022-12-31 DIAGNOSIS — Z882 Allergy status to sulfonamides status: Secondary | ICD-10-CM | POA: Diagnosis not present

## 2022-12-31 DIAGNOSIS — I959 Hypotension, unspecified: Secondary | ICD-10-CM | POA: Diagnosis not present

## 2022-12-31 DIAGNOSIS — Z888 Allergy status to other drugs, medicaments and biological substances status: Secondary | ICD-10-CM | POA: Diagnosis not present

## 2022-12-31 DIAGNOSIS — K5901 Slow transit constipation: Secondary | ICD-10-CM | POA: Diagnosis not present

## 2022-12-31 DIAGNOSIS — K5900 Constipation, unspecified: Secondary | ICD-10-CM | POA: Diagnosis present

## 2022-12-31 DIAGNOSIS — R1084 Generalized abdominal pain: Secondary | ICD-10-CM | POA: Diagnosis not present

## 2022-12-31 DIAGNOSIS — Z87891 Personal history of nicotine dependence: Secondary | ICD-10-CM | POA: Diagnosis not present

## 2022-12-31 DIAGNOSIS — Z6821 Body mass index (BMI) 21.0-21.9, adult: Secondary | ICD-10-CM | POA: Diagnosis not present

## 2022-12-31 DIAGNOSIS — I517 Cardiomegaly: Secondary | ICD-10-CM | POA: Diagnosis not present

## 2022-12-31 DIAGNOSIS — Z9012 Acquired absence of left breast and nipple: Secondary | ICD-10-CM

## 2022-12-31 DIAGNOSIS — E7800 Pure hypercholesterolemia, unspecified: Secondary | ICD-10-CM | POA: Diagnosis not present

## 2022-12-31 DIAGNOSIS — G8911 Acute pain due to trauma: Secondary | ICD-10-CM | POA: Diagnosis not present

## 2022-12-31 DIAGNOSIS — S72001A Fracture of unspecified part of neck of right femur, initial encounter for closed fracture: Secondary | ICD-10-CM | POA: Diagnosis not present

## 2022-12-31 DIAGNOSIS — D62 Acute posthemorrhagic anemia: Secondary | ICD-10-CM | POA: Diagnosis not present

## 2022-12-31 DIAGNOSIS — K30 Functional dyspepsia: Secondary | ICD-10-CM | POA: Diagnosis not present

## 2022-12-31 DIAGNOSIS — H9190 Unspecified hearing loss, unspecified ear: Secondary | ICD-10-CM | POA: Diagnosis not present

## 2022-12-31 DIAGNOSIS — N3941 Urge incontinence: Secondary | ICD-10-CM | POA: Diagnosis not present

## 2022-12-31 DIAGNOSIS — S79911A Unspecified injury of right hip, initial encounter: Secondary | ICD-10-CM | POA: Diagnosis not present

## 2022-12-31 DIAGNOSIS — M1990 Unspecified osteoarthritis, unspecified site: Secondary | ICD-10-CM | POA: Diagnosis not present

## 2022-12-31 DIAGNOSIS — Z170 Estrogen receptor positive status [ER+]: Secondary | ICD-10-CM

## 2022-12-31 DIAGNOSIS — N3010 Interstitial cystitis (chronic) without hematuria: Secondary | ICD-10-CM | POA: Diagnosis not present

## 2022-12-31 DIAGNOSIS — E440 Moderate protein-calorie malnutrition: Secondary | ICD-10-CM | POA: Insufficient documentation

## 2022-12-31 DIAGNOSIS — W010XXA Fall on same level from slipping, tripping and stumbling without subsequent striking against object, initial encounter: Secondary | ICD-10-CM | POA: Diagnosis not present

## 2022-12-31 DIAGNOSIS — Z86010 Personal history of colonic polyps: Secondary | ICD-10-CM | POA: Diagnosis not present

## 2022-12-31 HISTORY — DX: Hyperglycemia, unspecified: R73.9

## 2022-12-31 HISTORY — DX: Prediabetes: R73.03

## 2022-12-31 HISTORY — DX: Fracture of unspecified part of neck of right femur, initial encounter for closed fracture: S72.001A

## 2022-12-31 HISTORY — DX: Hypocalcemia: E83.51

## 2022-12-31 LAB — CBC WITH DIFFERENTIAL/PLATELET
Abs Immature Granulocytes: 0.03 10*3/uL (ref 0.00–0.07)
Basophils Absolute: 0 10*3/uL (ref 0.0–0.1)
Basophils Relative: 0 %
Eosinophils Absolute: 0.1 10*3/uL (ref 0.0–0.5)
Eosinophils Relative: 1 %
HCT: 41.1 % (ref 36.0–46.0)
Hemoglobin: 13.3 g/dL (ref 12.0–15.0)
Immature Granulocytes: 0 %
Lymphocytes Relative: 8 %
Lymphs Abs: 0.6 10*3/uL — ABNORMAL LOW (ref 0.7–4.0)
MCH: 31.4 pg (ref 26.0–34.0)
MCHC: 32.4 g/dL (ref 30.0–36.0)
MCV: 97.2 fL (ref 80.0–100.0)
Monocytes Absolute: 0.4 10*3/uL (ref 0.1–1.0)
Monocytes Relative: 6 %
Neutro Abs: 6.3 10*3/uL (ref 1.7–7.7)
Neutrophils Relative %: 85 %
Platelets: 140 10*3/uL — ABNORMAL LOW (ref 150–400)
RBC: 4.23 MIL/uL (ref 3.87–5.11)
RDW: 12.9 % (ref 11.5–15.5)
WBC: 7.5 10*3/uL (ref 4.0–10.5)
nRBC: 0 % (ref 0.0–0.2)

## 2022-12-31 LAB — URINALYSIS, ROUTINE W REFLEX MICROSCOPIC
Bacteria, UA: NONE SEEN
Bilirubin Urine: NEGATIVE
Glucose, UA: NEGATIVE mg/dL
Hgb urine dipstick: NEGATIVE
Ketones, ur: 20 mg/dL — AB
Leukocytes,Ua: NEGATIVE
Nitrite: NEGATIVE
Protein, ur: 30 mg/dL — AB
Specific Gravity, Urine: 1.024 (ref 1.005–1.030)
pH: 5 (ref 5.0–8.0)

## 2022-12-31 LAB — SURGICAL PCR SCREEN
MRSA, PCR: NEGATIVE
Staphylococcus aureus: POSITIVE — AB

## 2022-12-31 LAB — BASIC METABOLIC PANEL
Anion gap: 10 (ref 5–15)
BUN: 26 mg/dL — ABNORMAL HIGH (ref 8–23)
CO2: 22 mmol/L (ref 22–32)
Calcium: 8.4 mg/dL — ABNORMAL LOW (ref 8.9–10.3)
Chloride: 107 mmol/L (ref 98–111)
Creatinine, Ser: 0.73 mg/dL (ref 0.44–1.00)
GFR, Estimated: >60 mL/min (ref >60)
Glucose, Bld: 170 mg/dL — ABNORMAL HIGH (ref 70–99)
Potassium: 3.7 mmol/L (ref 3.5–5.1)
Sodium: 139 mmol/L (ref 135–145)

## 2022-12-31 LAB — HEPATIC FUNCTION PANEL
ALT: 27 U/L (ref 0–44)
AST: 24 U/L (ref 15–41)
Albumin: 4 g/dL (ref 3.5–5.0)
Alkaline Phosphatase: 56 U/L (ref 38–126)
Bilirubin, Direct: <0.1 mg/dL (ref 0.0–0.2)
Total Bilirubin: 0.9 mg/dL (ref 0.3–1.2)
Total Protein: 6.8 g/dL (ref 6.5–8.1)

## 2022-12-31 LAB — PROTIME-INR
INR: 1.1 (ref 0.8–1.2)
Prothrombin Time: 14.1 seconds (ref 11.4–15.2)

## 2022-12-31 LAB — GLUCOSE, CAPILLARY
Glucose-Capillary: 166 mg/dL — ABNORMAL HIGH (ref 70–99)
Glucose-Capillary: 173 mg/dL — ABNORMAL HIGH (ref 70–99)

## 2022-12-31 LAB — APTT: aPTT: 23 seconds — ABNORMAL LOW (ref 24–36)

## 2022-12-31 LAB — MAGNESIUM: Magnesium: 2 mg/dL (ref 1.7–2.4)

## 2022-12-31 LAB — PHOSPHORUS: Phosphorus: 3.3 mg/dL (ref 2.5–4.6)

## 2022-12-31 LAB — CBG MONITORING, ED: Glucose-Capillary: 168 mg/dL — ABNORMAL HIGH (ref 70–99)

## 2022-12-31 MED ORDER — POTASSIUM CHLORIDE CRYS ER 20 MEQ PO TBCR
20.0000 meq | EXTENDED_RELEASE_TABLET | Freq: Once | ORAL | Status: AC
  Administered 2022-12-31: 20 meq via ORAL
  Filled 2022-12-31: qty 1

## 2022-12-31 MED ORDER — CHLORHEXIDINE GLUCONATE 4 % EX SOLN: 60.0000 mL | Freq: Once | CUTANEOUS | Status: DC

## 2022-12-31 MED ORDER — ONDANSETRON HCL 4 MG PO TABS: 4.0000 mg | ORAL_TABLET | Freq: Four times a day (QID) | ORAL | Status: DC | PRN

## 2022-12-31 MED ORDER — ACETAMINOPHEN 325 MG PO TABS: 650.0000 mg | ORAL_TABLET | Freq: Four times a day (QID) | ORAL | Status: DC | PRN

## 2022-12-31 MED ORDER — FENTANYL CITRATE PF 50 MCG/ML IJ SOSY: 50.0000 ug | PREFILLED_SYRINGE | Freq: Four times a day (QID) | INTRAMUSCULAR | Status: DC | PRN

## 2022-12-31 MED ORDER — HYDROMORPHONE HCL 1 MG/ML IJ SOLN
0.5000 mg | Freq: Once | INTRAMUSCULAR | Status: AC
  Administered 2022-12-31: 0.5 mg via INTRAVENOUS
  Filled 2022-12-31: qty 1

## 2022-12-31 MED ORDER — HYDROMORPHONE HCL 1 MG/ML IJ SOLN
0.5000 mg | INTRAMUSCULAR | Status: DC | PRN
  Administered 2022-12-31 (×2): 0.5 mg via INTRAVENOUS
  Filled 2022-12-31: qty 0.5
  Filled 2022-12-31: qty 1

## 2022-12-31 MED ORDER — LETROZOLE 2.5 MG PO TABS
2.5000 mg | ORAL_TABLET | Freq: Every day | ORAL | Status: DC
  Administered 2023-01-01 – 2023-01-07 (×7): 2.5 mg via ORAL
  Filled 2022-12-31 (×8): qty 1

## 2022-12-31 MED ORDER — TRANEXAMIC ACID-NACL 1000-0.7 MG/100ML-% IV SOLN
1000.0000 mg | INTRAVENOUS | Status: AC
  Administered 2023-01-01: 1000 mg via INTRAVENOUS
  Filled 2022-12-31: qty 100

## 2022-12-31 MED ORDER — SENNOSIDES-DOCUSATE SODIUM 8.6-50 MG PO TABS
1.0000 | ORAL_TABLET | Freq: Two times a day (BID) | ORAL | Status: DC
  Administered 2022-12-31 – 2023-01-01 (×2): 1 via ORAL
  Filled 2022-12-31 (×2): qty 1

## 2022-12-31 MED ORDER — DEXAMETHASONE SODIUM PHOSPHATE 4 MG/ML IJ SOLN
INTRAMUSCULAR | Status: DC | PRN
  Administered 2022-12-31: 8 mg via PERINEURAL

## 2022-12-31 MED ORDER — LORATADINE 10 MG PO TABS: 10.0000 mg | ORAL_TABLET | Freq: Every day | ORAL | Status: DC | PRN

## 2022-12-31 MED ORDER — METOPROLOL SUCCINATE ER 25 MG PO TB24
12.5000 mg | ORAL_TABLET | Freq: Every day | ORAL | Status: DC
  Administered 2022-12-31 – 2023-01-06 (×7): 12.5 mg via ORAL
  Filled 2022-12-31 (×8): qty 1

## 2022-12-31 MED ORDER — FENTANYL CITRATE PF 50 MCG/ML IJ SOSY
PREFILLED_SYRINGE | INTRAMUSCULAR | Status: AC
  Administered 2022-12-31: 25 ug
  Filled 2022-12-31: qty 2

## 2022-12-31 MED ORDER — ONDANSETRON HCL 4 MG/2ML IJ SOLN: 4.0000 mg | Freq: Four times a day (QID) | INTRAMUSCULAR | Status: DC | PRN

## 2022-12-31 MED ORDER — INSULIN ASPART 100 UNIT/ML IJ SOLN
0.0000 [IU] | Freq: Three times a day (TID) | INTRAMUSCULAR | Status: DC
  Administered 2022-12-31 – 2023-01-01 (×3): 2 [IU] via SUBCUTANEOUS
  Administered 2023-01-02 – 2023-01-03 (×2): 1 [IU] via SUBCUTANEOUS
  Administered 2023-01-04: 2 [IU] via SUBCUTANEOUS
  Administered 2023-01-05 – 2023-01-07 (×4): 1 [IU] via SUBCUTANEOUS

## 2022-12-31 MED ORDER — AMLODIPINE BESYLATE 5 MG PO TABS
5.0000 mg | ORAL_TABLET | Freq: Every day | ORAL | Status: DC
  Administered 2023-01-01 – 2023-01-07 (×7): 5 mg via ORAL
  Filled 2022-12-31 (×7): qty 1

## 2022-12-31 MED ORDER — CLONIDINE HCL (ANALGESIA) 100 MCG/ML EP SOLN
EPIDURAL | Status: DC | PRN
  Administered 2022-12-31: 80 ug

## 2022-12-31 MED ORDER — ENSURE PRE-SURGERY PO LIQD
296.0000 mL | Freq: Once | ORAL | Status: DC
  Filled 2022-12-31: qty 296

## 2022-12-31 MED ORDER — OXYCODONE HCL 5 MG PO TABS
5.0000 mg | ORAL_TABLET | Freq: Four times a day (QID) | ORAL | Status: DC | PRN
  Administered 2023-01-01: 5 mg via ORAL
  Filled 2022-12-31: qty 1

## 2022-12-31 MED ORDER — ACETAMINOPHEN 500 MG PO TABS
1000.0000 mg | ORAL_TABLET | Freq: Once | ORAL | Status: AC
  Administered 2023-01-01: 1000 mg via ORAL
  Filled 2022-12-31: qty 2

## 2022-12-31 MED ORDER — MUPIROCIN 2 % EX OINT
TOPICAL_OINTMENT | Freq: Two times a day (BID) | CUTANEOUS | Status: DC
  Administered 2022-12-31 – 2023-01-01 (×3): 1 via NASAL
  Filled 2022-12-31 (×3): qty 22

## 2022-12-31 MED ORDER — CEFAZOLIN SODIUM-DEXTROSE 2-4 GM/100ML-% IV SOLN
2.0000 g | INTRAVENOUS | Status: AC
  Administered 2023-01-01: 2 g via INTRAVENOUS
  Filled 2022-12-31: qty 100

## 2022-12-31 MED ORDER — POVIDONE-IODINE 10 % EX SWAB: 2.0000 | Freq: Once | CUTANEOUS | Status: DC

## 2022-12-31 MED ORDER — ACETAMINOPHEN 650 MG RE SUPP: 650.0000 mg | Freq: Four times a day (QID) | RECTAL | Status: DC | PRN

## 2022-12-31 MED ORDER — ROPIVACAINE HCL 5 MG/ML IJ SOLN
INTRAMUSCULAR | Status: DC | PRN
  Administered 2022-12-31: 30 mL via PERINEURAL

## 2022-12-31 NOTE — H&P (View-Only) (Signed)
Called by EDP for R hip injury after mechanical GLF at home earlier today. X-rays show comminuted R intertrochanteric femur fracture. Admitted by [facility] for perioperative risk stratification and medical optimization. Injury requires surgical treatment. Plan for IM nail R femur tomorrow if OR schedule allows. NPO after MN. Hold chemical DVT ppx. Preop orders placed. Full consult to follow.  

## 2022-12-31 NOTE — Progress Notes (Signed)
Assisted  Dr. Renold Don with right, pericapsular nerve group (PENG) block. Side rails up, monitors on throughout procedure. See vital signs in flow sheet. Tolerated Procedure well.

## 2022-12-31 NOTE — H&P (Signed)
History and Physical    Patient: [name] [name] ZOX:[id number] DOB: 20-Jul-1932 DOA: 12/31/2022 DOS: the patient was seen and examined on 12/31/2022 PCP: Madelin Headings, MD  Patient coming from: Home  Chief Complaint:  Chief Complaint  Patient presents with   Hip Pain   HPI: [name] [name] is a 89 y.o. female with medical history significant of colon polyps, left breast cancer, interstitial cystitis, urinary incontinence, left ankle fracture, hearing loss, hyperlipidemia, hypertension, osteoarthritis right rotator cuff tear, herpes zoster who presented to the emergency department with right hip pain after she went outside, tripped while walking and landed on her right hip.  She denied any prodromal symptoms.  No fever, chills or night sweats. No sore throat, rhinorrhea, dyspnea, wheezing or hemoptysis.  No chest pain, palpitations, diaphoresis, PND, orthopnea or pitting edema of the lower extremities.  No abdominal pain, diarrhea, constipation, melena or hematochezia.  No flank pain, dysuria, frequency or hematuria.  No polyuria, polydipsia, polyphagia or blurred vision.  ED course: Initial vital signs were temperature 97.7 F, pulse 79, respirations 16, BP 152/89 mmHg O2 sat 96% on room air.  The patient received hydromorphone 0.5 mg IVP x 2.  Lab work: CBC showed a white count 7.5, hemoglobin 13.3 g/dL platelets 811.  BMP showed a glucose of 170, BUN 26, creatinine 0.73 and calcium 8.4 mg/dL.  Rest of the electrolytes were normal.  Imaging: Portable 1 view chest radiograph with cardiomegaly and pulmonary venous congestion.  Right pelvis and right femur show severely of the place and comminuted intertrochanteric fracture of the proximal right femur.   Review of Systems: As mentioned in the history of present illness. All other systems reviewed and are negative. Past Medical History:  Diagnosis Date   Adenomatous colon polyp     4 2008   Breast cancer (HCC) 1978   left breast    Complication of anesthesia    Cystitis, interstitial    FRACTURE, ANKLE, LEFT 12/14/2006   Qualifier: Diagnosis of  By: Lawernce Ion, CMA (AAMA), Shannon S    FRACTURE, WRIST 12/14/2006   Qualifier: Diagnosis of  By: Lawernce Ion, CMA (AAMA), Bethann Berkshire    Hearing loss    does not use hearing aids   Hyperlipidemia    Hypertension    Osteoarthritis    PONV (postoperative nausea and vomiting)    Episode after Ether   Rotator cuff tear, right    Shingles    Urinary incontinence    Past Surgical History:  Procedure Laterality Date   ABDOMINAL HYSTERECTOMY  08/03/1986   fibroids   BREAST BIOPSY Right 09/17/2021   BREAST LUMPECTOMY Right 10/27/2021   BREAST LUMPECTOMY WITH RADIOACTIVE SEED LOCALIZATION Right 10/27/2021   Procedure: RIGHT BREAST LUMPECTOMY WITH RADIOACTIVE SEED LOCALIZATION;  Surgeon: Griselda Miner, MD;  Location: [facility] OR;  Service: General;  Laterality: Right;   COLONOSCOPY     DILATION AND CURETTAGE OF UTERUS     x 2   MASTECTOMY Left    SHOULDER ARTHROSCOPY WITH DISTAL CLAVICLE RESECTION Right 12/23/2017   Procedure: SHOULDER ARTHROSCOPY WITH DISTAL CLAVICLE RESECTION;  Surgeon: Francena Hanly, MD;  Location: [facility] OR;  Service: Orthopedics;  Laterality: Right;   SHOULDER ARTHROSCOPY WITH ROTATOR CUFF REPAIR AND SUBACROMIAL DECOMPRESSION Right 12/23/2017   Procedure: SHOULDER ARTHROSCOPY WITH ROTATOR CUFF REPAIR AND SUBACROMIAL DECOMPRESSION, distal clavicle resection;  Surgeon: Francena Hanly, MD;  Location: [facility] OR;  Service: Orthopedics;  Laterality: Right;   TONSILLECTOMY     TUBAL LIGATION  08/03/1973  WISDOM TOOTH EXTRACTION     upper wisdom teeth only ext   Social History:  reports that she has quit smoking. Her smoking use included cigarettes. She has a 3.75 pack-year smoking history. She has never used smokeless tobacco. She reports that she does not currently use alcohol. She reports that she does not use drugs.  Allergies  Allergen Reactions   Nitrofurantoin Itching     Head to toe itching   Other     Black pepper-sneezing   Sulfamethoxazole     Unknown reaction     Family History  Problem Relation Age of Onset   Suicidality Mother    Heart attack Father    Cancer - Other Daughter        throat cancer    Thyroid disease Neg Hx     Prior to Admission medications   Medication Sig Start Date End Date Taking? Authorizing Provider  amLODipine (NORVASC) 5 MG tablet TAKE 1 TABLET(5 MG) BY MOUTH DAILY 08/13/22   [name], Neta Mends, MD  Ascorbic Acid (VITAMIN C) 500 MG tablet Take 500 mg by mouth 2 (two) times daily.     [provider]  Calcium Carb-Cholecalciferol 600-800 MG-UNIT TABS Take 1 tablet by mouth daily.    [provider]  letrozole (FEMARA) 2.5 MG tablet TAKE 1 TABLET(2.5 MG) BY MOUTH DAILY 11/10/22   Serena Croissant, MD  loratadine (CLARITIN) 10 MG tablet Take 10 mg by mouth daily as needed for allergies.    [provider]  MELATONIN PO Take 1 tablet by mouth at bedtime as needed (sleep).    [provider]  Multiple Vitamins-Minerals (CENTRUM SILVER PO) Take 1 tablet by mouth daily.    [provider]    Physical Exam: Vitals:   12/31/22 1100 12/31/22 1130 12/31/22 1320  BP: (!) 152/89  (!) 143/71  Pulse: 79 82 (!) 43  Resp: 16  15  Temp: 97.7 F (36.5 C)    TempSrc: Oral    SpO2: 96% 95% 92%   Physical Exam Vitals and nursing note reviewed.  Constitutional:      General: She is awake. She is not in acute distress.    Appearance: Normal appearance.  HENT:     Head: Normocephalic.     Nose: No rhinorrhea.     Mouth/Throat:     Mouth: Mucous membranes are moist.  Eyes:     General: No scleral icterus.    Pupils: Pupils are equal, round, and reactive to light.  Neck:     Vascular: No JVD.  Cardiovascular:     Rate and Rhythm: Normal rate and regular rhythm.     Heart sounds: S1 normal and S2 normal.  Pulmonary:     Effort: Pulmonary effort is normal.     Breath sounds: Normal  breath sounds. No wheezing, rhonchi or rales.  Abdominal:     General: Bowel sounds are normal.     Palpations: Abdomen is soft.     Tenderness: There is no abdominal tenderness.  Musculoskeletal:     Cervical back: Neck supple.     Right lower leg: No edema.     Left lower leg: No edema.     Comments: Shortening and laterally rotated RLE with good distal pulse.  Skin:    General: Skin is warm and dry.  Neurological:     General: No focal deficit present.     Mental Status: She is alert and oriented to person, place, and time.  Psychiatric:        Mood and Affect: Mood normal.        Behavior: Behavior normal. Behavior is cooperative.     Data Reviewed:  Results are pending, will review when available.  EKG: Vent. rate 85 BPM PR interval 162 ms QRS duration 96 ms QT/QTcB 403/480 ms P-R-T axes 82 31 76 Sinus rhythm Multiform ventricular premature complexes Consider left atrial enlargement  Assessment and Plan: Principal Problem:   Closed right hip fracture, initial encounter (HCC) Admit to telemetry/inpatient. Ice area as needed. Buck's traction per protocol. Analgesics as needed. Antiemetics as needed. Consult TOC team. Consult nutritional services. PT evaluation after surgery. Orthopedic surgery has been consulted.. Anesthesia has performed area nerve block.  Active Problems:   Essential hypertension Continue amlodipine 5 mg p.o. daily.    Constipation Begin Senokot-S twice daily.    INTERSTITIAL CYSTITIS Has occasional incontinence. Check urine analysis.    Malignant neoplasm of upper-inner quadrant  of right breast in female, estrogen receptor positive (HCC) Continue letrozole 2.5 mg p.o. daily. Follow-up with oncology as an outpatient.    Hypocalcemia Last vitamin D level was normal. Recheck calcium level in AM. Further workup depending on results.    Prediabetes Carbohydrate modified diet. CBG monitoring with [facility] while in the  hospital.    Hyperlipidemia Currently not on therapy. Follow-up with primary care provider.    Advance Care Planning:   Code Status: DNR   Consults: Orthopedic surgery Samson Frederic MD).  Family Communication:   Severity of Illness: The appropriate patient status for this patient is INPATIENT. Inpatient status is judged to be reasonable and necessary in order to provide the required intensity of service to ensure the patient's safety. The patient's presenting symptoms, physical exam findings, and initial radiographic and laboratory data in the context of their chronic comorbidities is felt to place them at high risk for further clinical deterioration. Furthermore, it is not anticipated that the patient will be medically stable for discharge from the hospital within 2 midnights of admission.   * I certify that at the point of admission it is my clinical judgment that the patient will require inpatient hospital care spanning beyond 2 midnights from the point of admission due to high intensity of service, high risk for further deterioration and high frequency of surveillance required.*  Author: Bobette Mo, MD 12/31/2022 1:32 PM  For on call review [email or web address].   This document was prepared using Dragon voice recognition software and may contain some unintended transcription errors.

## 2022-12-31 NOTE — ED Provider Notes (Signed)
[place] EMERGENCY DEPARTMENT AT [facility] Provider Note   CSN: [id number] Arrival date & time: 12/31/22  1102     History  Chief Complaint  Patient presents with   Hip Pain    [name] [name] is a 89 y.o. female with HTN, HLD, interstitial cystitis, h/o breast cancer currently on letrozole,  presents with R hip pain.   BIBA from home for possible hip fracture. Pt was in her normal state of health outside trying to get into the recycling bin which she states is top heavy and it fell over on to pof her. Landed on R side and R hip. Denies pain anywhere else. Did not hit her head or lose consciousness. Denies new numbness/tingling. Has not been able to get up or walk since the event. Severe pain/obvious deformity of R hip noted. 100 mcg Fentanyl given PTA. Caretaker of husband.    Hip Pain       Home Medications Prior to Admission medications   Medication Sig Start Date End Date Taking? Authorizing Provider  amLODipine (NORVASC) 5 MG tablet TAKE 1 TABLET(5 MG) BY MOUTH DAILY 08/13/22   [name], Neta Mends, MD  Ascorbic Acid (VITAMIN C) 500 MG tablet Take 500 mg by mouth 2 (two) times daily.     [provider]  Calcium Carb-Cholecalciferol 600-800 MG-UNIT TABS Take 1 tablet by mouth daily.    [provider]  letrozole (FEMARA) 2.5 MG tablet TAKE 1 TABLET(2.5 MG) BY MOUTH DAILY 11/10/22   Serena Croissant, MD  loratadine (CLARITIN) 10 MG tablet Take 10 mg by mouth daily as needed for allergies.    [provider]  MELATONIN PO Take 1 tablet by mouth at bedtime as needed (sleep).    [provider]  Multiple Vitamins-Minerals (CENTRUM SILVER PO) Take 1 tablet by mouth daily.    [provider]      Allergies    Nitrofurantoin, Other, [place], and Sulfamethoxazole    Review of Systems   Review of Systems Review of systems Negative for head trauma, LOC.  A 10 point review of systems was performed and is negative unless otherwise  reported in HPI.  Physical Exam Updated Vital Signs BP (!) 143/71   Pulse (!) 43   Temp 97.7 F (36.5 C) (Oral)   Resp 15   SpO2 92%  Physical Exam General: Elderly appearing female, lying in bed.  HEENT: PERRLA, Sclera anicteric, cx, trachea midline. NCAT.  Cardiology: RRR, no murmurs/rubs/gallops. BL radial and DP pulses equal bilaterally.  Resp: Normal respiratory rate and effort. CTAB, no wheezes, rhonchi, crackles.  Abd: Soft, non-tender, non-distended. No rebound tenderness or guarding.  GU: Deferred. MSK: Shortened/externally rotated R leg. Severe pain to palpation to R hip. No pain to palpation to R femur/knee. No deformities or femur/knee. +Deformity noted to R hip. Compartments soft. Intact distal DP/PT pulses bilaterally. Limited ROM of R hip/knee d/t pain. Full ROM of L lower extremity. No other deformities/TTP to UEs.  Skin: warm, dry. No rashes or lesions. Back: No midline C T or L spine TTP Neuro: A&Ox4, CNs II-XII grossly intact. MAEs. Sensation grossly intact.  Psych: Normal mood and affect.   ED Results / Procedures / Treatments   Labs (all labs ordered are listed, but only abnormal results are displayed) Labs Reviewed  CBC WITH DIFFERENTIAL/PLATELET - Abnormal; Notable for the following components:      Result Value   Platelets 140 (*)    Lymphs Abs 0.6 (*)  All other components within normal limits  BASIC METABOLIC PANEL - Abnormal; Notable for the following components:   Glucose, Bld 170 (*)    BUN 26 (*)    Calcium 8.4 (*)    All other components within normal limits  CBG MONITORING, ED - Abnormal; Notable for the following components:   Glucose-Capillary 168 (*)    All other components within normal limits  PROTIME-INR  APTT  MAGNESIUM  HEPATIC FUNCTION PANEL  PHOSPHORUS    EKG EKG Interpretation  Date/Time:  Thursday Dec 31 2022 12:01:09 EDT Ventricular Rate:  85 PR Interval:  162 QRS Duration: 96 QT Interval:  403 QTC  Calculation: 480 R Axis:   31 Text Interpretation: Sinus rhythm Multiform ventricular premature complexes Consider left atrial enlargement Confirmed by Vivi Barrack [phone]) on 12/31/2022 12:13:02 PM  Radiology DG Pelvis 1-2 Views  Result Date: 12/31/2022 CLINICAL DATA:  Fall. EXAM: PELVIS - 1-2 VIEW COMPARISON:  None Available. FINDINGS: Severely displaced and comminuted fracture is seen involving intertrochanteric region of proximal right femur. No other fracture or dislocation is noted. IMPRESSION: Severely displaced and comminuted intertrochanteric fracture of proximal right femur. Electronically Signed   By: Lupita Raider M.D.   On: 12/31/2022 12:40   DG Femur Min 2 Views Right  Result Date: 12/31/2022 CLINICAL DATA:  Fall. EXAM: RIGHT FEMUR 2 VIEWS COMPARISON:  None Available. FINDINGS: Severely displaced and comminuted intertrochanteric fracture of proximal right femur is noted. Middle and distal portions of right humerus are unremarkable. IMPRESSION: Severely displaced and comminuted intertrochanteric fracture of proximal right femur. Electronically Signed   By: Lupita Raider M.D.   On: 12/31/2022 12:39   DG Chest Portable 1 View  Result Date: 12/31/2022 CLINICAL DATA:  fall EXAM: PORTABLE CHEST 1 VIEW COMPARISON:  CXR 02/16/11 FINDINGS: Left axillary surgical clips. Cardiomegaly. No pleural effusion. No pneumothorax. There are prominent bilateral opacities that could represent pulmonary venous congestion or atypical infection. No focal airspace opacity. No radiographically apparent displaced rib fractures degenerative changes of the bilateral glenohumeral joints. Degenerative changes in the visualized lumbar spine. IMPRESSION: Cardiomegaly and pulmonary venous congestion. Electronically Signed   By: Lorenza Cambridge M.D.   On: 12/31/2022 12:38    Procedures Procedures    Medications Ordered in ED Medications  HYDROmorphone (DILAUDID) injection 0.5 mg (0.5 mg Intravenous Given 12/31/22  1316)  oxyCODONE (Oxy IR/ROXICODONE) immediate release tablet 5 mg (has no administration in time range)  acetaminophen (TYLENOL) tablet 650 mg (has no administration in time range)    Or  acetaminophen (TYLENOL) suppository 650 mg (has no administration in time range)  ondansetron (ZOFRAN) tablet 4 mg (has no administration in time range)    Or  ondansetron (ZOFRAN) injection 4 mg (has no administration in time range)  HYDROmorphone (DILAUDID) injection 0.5 mg (0.5 mg Intravenous Given 12/31/22 1154)    ED Course/ Medical Decision Making/ A&P                          Medical Decision Making Amount and/or Complexity of Data Reviewed Labs: ordered. Decision-making details documented in ED Course. Radiology: ordered. Decision-making details documented in ED Course.  Risk Prescription drug management. Decision regarding hospitalization.    This patient presents to the ED for concern of fall, R hip pain, this involves an extensive number of treatment options, and is a complaint that carries with it a high risk of complications and morbidity.  I considered the following differential and  admission for this acute, potentially life threatening condition. PT is HDS, well-appearing overall.   MDM:    For patient's hip pain and fall, consider hip dislocation vs fracture or femur fracture. Pt w/ shortened/externally rotated R leg. Neurovascularly intact at this time with good distal pulses. Will obtain XRs to evaluate.  Fall was mechanical and patient reported no chest pain, lightheadedness, palpitations or shortness of breath prior to the fall.  Patient has no pain elsewhere.   Clinical Course as of 12/31/22 1405  Thu Dec 31, 2022  1308 Glucose-Capillary(!): 168 [HN]  1308 CBC with Differential(!) Unremarkable in the context of this patient's presentation  [HN]  1308 Basic metabolic panel(!) Unremarkable in the context of this patient's presentation  [HN]  1308 DG Pelvis 1-2  Views Severely displaced and comminuted intertrochanteric fracture of proximal right femur.   [HN]  1308 DG Chest Portable 1 View FINDINGS: Left axillary surgical clips. Cardiomegaly. No pleural effusion. No pneumothorax. There are prominent bilateral opacities that could represent pulmonary venous congestion or atypical infection. No focal airspace opacity. No radiographically apparent displaced rib fractures degenerative changes of the bilateral glenohumeral joints. Degenerative changes in the visualized lumbar spine.  IMPRESSION: Cardiomegaly and pulmonary venous congestion.    Patient without any hypoxia, tachypnea, SOB. [HN]  1309 Consulted to ortho and hospitalist for admission. [HN]  1312 Consulted Dr. Linna Caprice w/ [facility] [HN]    Clinical Course User Index [HN] Loetta Rough, MD    Labs: I Ordered, and personally interpreted labs.  The pertinent results include:  those listed above  Imaging Studies ordered: I ordered imaging studies including R hip XR, R femur, R knee XR I independently visualized and interpreted imaging. I agree with the radiologist interpretation  Additional history obtained from chart review, EMS.    Cardiac Monitoring: The patient was maintained on a cardiac monitor.  I personally viewed and interpreted the cardiac monitored which showed an underlying rhythm of: NSR  Reevaluation: After the interventions noted above, I reevaluated the patient and found that they have :stayed the same  Social Determinants of Health: Patient lives independently   Disposition:  Admitted to hospitalist Dr. Robb Matar. Dr. Linna Caprice to see. Pain controlled w/ dilaudid.   Co morbidities that complicate the patient evaluation  Past Medical History:  Diagnosis Date   Adenomatous colon polyp     4 2008   Breast cancer (HCC) 1978   left breast   Complication of anesthesia    Cystitis, interstitial    FRACTURE, ANKLE, LEFT 12/14/2006   Qualifier: Diagnosis  of  By: Lawernce Ion, CMA (AAMA), Bethann Berkshire    FRACTURE, WRIST 12/14/2006   Qualifier: Diagnosis of  By: Lawernce Ion, CMA (AAMA), Bethann Berkshire    Hearing loss    does not use hearing aids   Hyperlipidemia    Hypertension    Osteoarthritis    PONV (postoperative nausea and vomiting)    Episode after Ether   Rotator cuff tear, right    Shingles    Urinary incontinence      Medicines Meds ordered this encounter  Medications   HYDROmorphone (DILAUDID) injection 0.5 mg   HYDROmorphone (DILAUDID) injection 0.5 mg   oxyCODONE (Oxy IR/ROXICODONE) immediate release tablet 5 mg   OR Linked Order Group    acetaminophen (TYLENOL) tablet 650 mg    acetaminophen (TYLENOL) suppository 650 mg   OR Linked Order Group    ondansetron (ZOFRAN) tablet 4 mg    ondansetron (ZOFRAN) injection 4 mg  I have reviewed the patients home medicines and have made adjustments as needed  Problem List / ED Course: Problem List Items Addressed This Visit   None Visit Diagnoses     Closed intertrochanteric fracture of hip, right, initial encounter [facility])    -  Primary                   This note was created using dictation software, which may contain spelling or grammatical errors.    Loetta Rough, MD 12/31/22 [phone]

## 2022-12-31 NOTE — ED Notes (Signed)
Patient transported to x-ray. ?

## 2022-12-31 NOTE — Anesthesia Postprocedure Evaluation (Signed)
Anesthesia Post Note  Patient: [name] [name]  Procedure(s) Performed: AN AD HOC NERVE BLOCK     Patient location during evaluation: PACU Anesthesia Type: Regional Level of consciousness: awake and alert Pain management: pain level controlled Vital Signs Assessment: post-procedure vital signs reviewed and stable Respiratory status: spontaneous breathing Cardiovascular status: stable Anesthetic complications: no Comments: Pain 10/10 with movement. Pt had rapid improvement of pain with block.    No notable events documented.  Last Vitals:  Vitals:   12/31/22 1435 12/31/22 1440  BP: (!) 149/62   Pulse: 93 93  Resp: 14 17  Temp:    SpO2: 95% 95%    Last Pain:  Vitals:   12/31/22 1110  TempSrc:   PainSc: 5                  Lewie Loron

## 2022-12-31 NOTE — ED Triage Notes (Signed)
BIBA from home for possible hip fracture. Pt was outside and tripped, landed on right hip- shortened, obvious deformity noted. 100 Fentanyl given PTA. 20g left forearm 152/84 BP 94% room air 92 HR

## 2022-12-31 NOTE — ED Notes (Signed)
ED TO INPATIENT HANDOFF REPORT  Name/Age/Gender [name] [name] 89 y.o. female  Code Status    Code Status Orders  (From admission, onward)           Start     Ordered   12/31/22 1337  Full code  Continuous       Question:  By:  Answer:  Consent: discussion documented in EHR   12/31/22 1336           Code Status History     Date Active Date Inactive Code Status Order ID Comments User Context   12/31/2022 1337 12/31/2022 1337 Full Code [id number]  Bobette Mo, MD ED       Home/SNF/Other Skilled nursing facility  Chief Complaint Closed right hip fracture, initial encounter [facility]) [S72.001A]  Level of Care/Admitting Diagnosis ED Disposition     ED Disposition  Admit   Condition  --   Comment  Hospital Area: [facility] [[id number]]  Level of Care: Telemetry [5]  Admit to tele based on following criteria: Other see comments  Comments: Bradycardia  May admit patient to [facility] or [facility] if equivalent level of care is available:: No  Covid Evaluation: Asymptomatic - no recent exposure (last 10 days) testing not required  Diagnosis: Closed right hip fracture, initial encounter [facility]) [[id number]]  Admitting Physician: Bobette Mo [[id number]]  Attending Physician: Bobette Mo [[id number]]  Certification:: I certify this patient will need inpatient services for at least 2 midnights  Estimated Length of Stay: 3          Medical History Past Medical History:  Diagnosis Date   Adenomatous colon polyp     4 2008   Breast cancer (HCC) 1978   left breast   Complication of anesthesia    Cystitis, interstitial    FRACTURE, ANKLE, LEFT 12/14/2006   Qualifier: Diagnosis of  By: Lawernce Ion, CMA (AAMA), Shannon S    FRACTURE, WRIST 12/14/2006   Qualifier: Diagnosis of  By: Lawernce Ion, CMA (AAMA), Bethann Berkshire    Hearing loss    does not use hearing aids   Hyperlipidemia    Hypertension    Osteoarthritis    PONV (postoperative  nausea and vomiting)    Episode after Ether   Rotator cuff tear, right    Shingles    Urinary incontinence     Allergies Allergies  Allergen Reactions   Nitrofurantoin Itching    Head to toe itching   Other     Black pepper-sneezing   Piper Other (See Comments)    Sneezing    Sulfamethoxazole     Unknown reaction     IV Location/Drains/Wounds Patient Lines/Drains/Airways Status     Active Line/Drains/Airways     Name Placement date Placement time Site Days   Peripheral IV 12/31/22 20 G Anterior;Right Forearm 12/31/22  1110  Forearm  less than 1            Labs/Imaging Results for orders placed or performed during the hospital encounter of 12/31/22 (from the past 48 hour(s))  CBC with Differential     Status: Abnormal   Collection Time: 12/31/22 11:43 AM  Result Value Ref Range   WBC 7.5 4.0 - 10.5 K/uL   RBC 4.23 3.87 - 5.11 MIL/uL   Hemoglobin 13.3 12.0 - 15.0 g/dL   HCT 63.8 75.6 - 43.3 %   MCV 97.2 80.0 - 100.0 fL   MCH 31.4 26.0 - 34.0 pg   MCHC 32.4 30.0 -  36.0 g/dL   RDW 16.1 09.6 - 04.5 %   Platelets 140 (L) 150 - 400 K/uL   nRBC 0.0 0.0 - 0.2 %   Neutrophils Relative % 85 %   Neutro Abs 6.3 1.7 - 7.7 K/uL   Lymphocytes Relative 8 %   Lymphs Abs 0.6 (L) 0.7 - 4.0 K/uL   Monocytes Relative 6 %   Monocytes Absolute 0.4 0.1 - 1.0 K/uL   Eosinophils Relative 1 %   Eosinophils Absolute 0.1 0.0 - 0.5 K/uL   Basophils Relative 0 %   Basophils Absolute 0.0 0.0 - 0.1 K/uL   Immature Granulocytes 0 %   Abs Immature Granulocytes 0.03 0.00 - 0.07 K/uL    Comment: Performed at [facility], [address], [place], [place] [address]  Basic metabolic panel     Status: Abnormal   Collection Time: 12/31/22 11:43 AM  Result Value Ref Range   Sodium 139 135 - 145 mmol/L   Potassium 3.7 3.5 - 5.1 mmol/L   Chloride 107 98 - 111 mmol/L   CO2 22 22 - 32 mmol/L   Glucose, Bld 170 (H) 70 - 99 mg/dL    Comment: Glucose reference range applies  only to samples taken after fasting for at least 8 hours.   BUN 26 (H) 8 - 23 mg/dL   Creatinine, Ser 1.91 0.44 - 1.00 mg/dL   Calcium 8.4 (L) 8.9 - 10.3 mg/dL   GFR, Estimated >47 >82 mL/min    Comment: (NOTE) Calculated using the CKD-EPI Creatinine Equation (2021)    Anion gap 10 5 - 15    Comment: Performed at [facility], [address] [place], [place] [address]  POC CBG, ED     Status: Abnormal   Collection Time: 12/31/22 12:50 PM  Result Value Ref Range   Glucose-Capillary 168 (H) 70 - 99 mg/dL    Comment: Glucose reference range applies only to samples taken after fasting for at least 8 hours.   DG Pelvis 1-2 Views  Result Date: 12/31/2022 CLINICAL DATA:  Fall. EXAM: PELVIS - 1-2 VIEW COMPARISON:  None Available. FINDINGS: Severely displaced and comminuted fracture is seen involving intertrochanteric region of proximal right femur. No other fracture or dislocation is noted. IMPRESSION: Severely displaced and comminuted intertrochanteric fracture of proximal right femur. Electronically Signed   By: Lupita Raider M.D.   On: 12/31/2022 12:40   DG Femur Min 2 Views Right  Result Date: 12/31/2022 CLINICAL DATA:  Fall. EXAM: RIGHT FEMUR 2 VIEWS COMPARISON:  None Available. FINDINGS: Severely displaced and comminuted intertrochanteric fracture of proximal right femur is noted. Middle and distal portions of right humerus are unremarkable. IMPRESSION: Severely displaced and comminuted intertrochanteric fracture of proximal right femur. Electronically Signed   By: Lupita Raider M.D.   On: 12/31/2022 12:39   DG Chest Portable 1 View  Result Date: 12/31/2022 CLINICAL DATA:  fall EXAM: PORTABLE CHEST 1 VIEW COMPARISON:  CXR 02/16/11 FINDINGS: Left axillary surgical clips. Cardiomegaly. No pleural effusion. No pneumothorax. There are prominent bilateral opacities that could represent pulmonary venous congestion or atypical infection. No focal airspace opacity. No  radiographically apparent displaced rib fractures degenerative changes of the bilateral glenohumeral joints. Degenerative changes in the visualized lumbar spine. IMPRESSION: Cardiomegaly and pulmonary venous congestion. Electronically Signed   By: Lorenza Cambridge M.D.   On: 12/31/2022 12:38    Pending Labs [employer] (From admission, onward)     Start  Ordered   01/01/23 0500  CBC  Tomorrow morning,   R        12/31/22 1336   01/01/23 0500  Comprehensive metabolic panel  Tomorrow morning,   R        12/31/22 1336   12/31/22 1339  Magnesium  Add-on,   AD        12/31/22 1338   12/31/22 1339  Hepatic function panel  Add-on,   AD        12/31/22 1338   12/31/22 1339  Phosphorus  Add-on,   AD        12/31/22 1338   12/31/22 1314  Protime-INR  Add-on,   AD        12/31/22 1313   12/31/22 1314  APTT  Add-on,   AD        12/31/22 1313            Vitals/Pain Today's Vitals   12/31/22 1110 12/31/22 1130 12/31/22 1320 12/31/22 1400  BP:   (!) 143/71   Pulse:  82 (!) 43 (!) 46  Resp:   15 13  Temp:      TempSrc:      SpO2:  95% 92% 93%  PainSc: 5        Isolation Precautions No active isolations  Medications Medications  HYDROmorphone (DILAUDID) injection 0.5 mg (0.5 mg Intravenous Given 12/31/22 1316)  oxyCODONE (Oxy IR/ROXICODONE) immediate release tablet 5 mg (has no administration in time range)  acetaminophen (TYLENOL) tablet 650 mg (has no administration in time range)    Or  acetaminophen (TYLENOL) suppository 650 mg (has no administration in time range)  ondansetron (ZOFRAN) tablet 4 mg (has no administration in time range)    Or  ondansetron (ZOFRAN) injection 4 mg (has no administration in time range)  HYDROmorphone (DILAUDID) injection 0.5 mg (0.5 mg Intravenous Given 12/31/22 1154)    Mobility walks

## 2022-12-31 NOTE — Anesthesia Preprocedure Evaluation (Addendum)
Anesthesia Evaluation  Patient identified by MRN, date of birth, ID band Patient awake    Reviewed: Allergy & Precautions, Patient's Chart, lab work & pertinent test results  History of Anesthesia Complications (+) PONV and history of anesthetic complications  Airway Mallampati: II  TM Distance: >3 FB Neck ROM: Full    Dental  (+) Chipped, Missing, Dental Advisory Given, Poor Dentition   Pulmonary former smoker   Pulmonary exam normal breath sounds clear to auscultation       Cardiovascular hypertension, Pt. on medications  Rhythm:Irregular Rate:Normal     Neuro/Psych Hearing loss  negative psych ROS   GI/Hepatic negative GI ROS, Neg liver ROS,,,  Endo/Other  negative endocrine ROS    Renal/GU negative Renal ROS     Musculoskeletal  (+) Arthritis ,    Abdominal   Peds  Hematology negative hematology ROS (+)   Anesthesia Other Findings RIGHT BREAST CANCER  Reproductive/Obstetrics                             Anesthesia Physical Anesthesia Plan  ASA: 3  Anesthesia Plan: Regional   Post-op Pain Management:    Induction:   PONV Risk Score and Plan:   Airway Management Planned:   Additional Equipment:   Intra-op Plan:   Post-operative Plan:   Informed Consent: I have reviewed the patients History and Physical, chart, labs and discussed the procedure including the risks, benefits and alternatives for the proposed anesthesia with the patient or authorized representative who has indicated his/her understanding and acceptance.     Consent reviewed with POA  Plan Discussed with:   Anesthesia Plan Comments: (Son, who is POA, at bedside. Procedure risks and benefits reviewed at length with son and patient.    PAT note written 10/24/2021 by Shonna Chock, PA-C. )        Anesthesia Quick Evaluation

## 2022-12-31 NOTE — Anesthesia Procedure Notes (Signed)
Anesthesia Regional Block: Femoral nerve block   Pre-Anesthetic Checklist: , timeout performed,  Correct Patient, Correct Site, Correct Laterality,  Correct Procedure, Correct Position, site marked,  Risks and benefits discussed,  Surgical consent,  Pre-op evaluation,  At surgeon's request and post-op pain management  Laterality: Lower and Right  Prep: chloraprep       Needles:  Injection technique: Single-shot  Needle Type: Stimulator Needle - 80     Needle Length: 9cm  Needle Gauge: 22   Needle insertion depth: 6 cm   Additional Needles:   Procedures:, nerve stimulator,,, ultrasound used (permanent image in chart),,     Nerve Stimulator or Paresthesia:  Response: Patellar snap, 0.5 mA  Additional Responses:   Narrative:  Start time: 12/31/2022 2:21 PM End time: 12/31/2022 2:43 PM Injection made incrementally with aspirations every 5 mL.  Performed by: Personally  Anesthesiologist: Lewie Loron, MD  Additional Notes: BP cuff, EKG monitors applied. Sedation begun. Femoral artery palpated for location of nerve. After nerve location verified with U/S, anesthetic injected incrementally, slowly, and after negative aspirations under direct u/s guidance. Good perineural spread. Patient tolerated well.

## 2022-12-31 NOTE — ED Notes (Signed)
Pt taken to PACU for hip block procedure

## 2022-12-31 NOTE — Progress Notes (Signed)
Called by EDP for R hip injury after mechanical GLF at home earlier today. X-rays show comminuted R intertrochanteric femur fracture. Admitted by [facility] for perioperative risk stratification and medical optimization. Injury requires surgical treatment. Plan for IM nail R femur tomorrow if OR schedule allows. NPO after MN. Hold chemical DVT ppx. Preop orders placed. Full consult to follow.

## 2022-12-31 NOTE — Consult Note (Signed)
ORTHOPAEDIC CONSULTATION  REQUESTING PHYSICIAN: Bobette Mo, MD  PCP:  Madelin Headings, MD  Chief Complaint: Right hip injury  HPI: [name] [name] is a 89 y.o. female medical history significant left breast cancer, interstitial cystitis, urinary incontinence, hyperlipidemia, hypertension, osteoarthritis, who presented to the emergency department with right hip pain after she went outside, and fell and landed on her right hip. She denies any dizziness prior to fall. She denies hitting her head or LOC. X-rays showed right comminuted intertrochanteric femur fracture. Orthopedics was consulted for management of her right hip fracture. [facility] has already admitted the patient.   She denies any tingling or numbness in LE bilaterally. She denies any significant heat/kidney/lung history. No history of T2DM. She does not take any blood thinners, or have history of DVT/PE. She lives at home with her 30 year old husband who she takes care of. She normally does not ambulate with any assistive devices.   . Past Medical History:  Diagnosis Date   Adenomatous colon polyp     4 2008   Breast cancer (HCC) 1978   left breast   Complication of anesthesia    Cystitis, interstitial    FRACTURE, ANKLE, LEFT 12/14/2006   Qualifier: Diagnosis of  By: Lawernce Ion, CMA (AAMA), Shannon S    FRACTURE, WRIST 12/14/2006   Qualifier: Diagnosis of  By: Lawernce Ion, CMA (AAMA), Bethann Berkshire    Hearing loss    does not use hearing aids   Hyperlipidemia    Hypertension    Osteoarthritis    PONV (postoperative nausea and vomiting)    Episode after Ether   Rotator cuff tear, right    Shingles    Urinary incontinence    Past Surgical History:  Procedure Laterality Date   ABDOMINAL HYSTERECTOMY  08/03/1986   fibroids   BREAST BIOPSY Right 09/17/2021   BREAST LUMPECTOMY Right 10/27/2021   BREAST LUMPECTOMY WITH RADIOACTIVE SEED LOCALIZATION Right 10/27/2021   Procedure: RIGHT BREAST LUMPECTOMY WITH  RADIOACTIVE SEED LOCALIZATION;  Surgeon: Griselda Miner, MD;  Location: [facility] OR;  Service: General;  Laterality: Right;   COLONOSCOPY     DILATION AND CURETTAGE OF UTERUS     x 2   MASTECTOMY Left    SHOULDER ARTHROSCOPY WITH DISTAL CLAVICLE RESECTION Right 12/23/2017   Procedure: SHOULDER ARTHROSCOPY WITH DISTAL CLAVICLE RESECTION;  Surgeon: Francena Hanly, MD;  Location: [facility] OR;  Service: Orthopedics;  Laterality: Right;   SHOULDER ARTHROSCOPY WITH ROTATOR CUFF REPAIR AND SUBACROMIAL DECOMPRESSION Right 12/23/2017   Procedure: SHOULDER ARTHROSCOPY WITH ROTATOR CUFF REPAIR AND SUBACROMIAL DECOMPRESSION, distal clavicle resection;  Surgeon: Francena Hanly, MD;  Location: [facility] OR;  Service: Orthopedics;  Laterality: Right;   TONSILLECTOMY     TUBAL LIGATION  08/03/1973   WISDOM TOOTH EXTRACTION     upper wisdom teeth only ext   Social History   Socioeconomic History   Marital status: Married    Spouse name: Not on file   Number of children: 3   Years of education: Not on file   Highest education level: Not on file  Occupational History    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  Tobacco Use   Smoking status: Former    Packs/day: 0.25    Years: 15.00    Additional pack years: 0.00    Total pack years: 3.75    Types: Cigarettes   Smokeless tobacco: Never   Tobacco comments:    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  not interested in caregiver or respite support   Enjoys reading, walking around the house.   Social Determinants of Health   Financial Resource Strain: Low Risk  (09/23/2021)   Overall Financial Resource Strain (CARDIA)    Difficulty of Paying Living Expenses: Not hard at all  Food Insecurity: No Food Insecurity (12/31/2022)   Hunger Vital Sign    Worried About Running Out of Food in the Last Year: Never true    Ran Out of Food in the Last Year: Never true  Transportation Needs: No Transportation Needs (12/31/2022)   PRAPARE - Administrator, Civil Service (Medical): No    Lack of Transportation (Non-Medical): No  Physical Activity: Inactive (09/23/2021)   Exercise Vital Sign    Days of Exercise per Week: 0 days    Minutes of Exercise per Session: 0 min  Stress: Stress Concern Present (09/23/2021)   [employer] of Occupational Health - Occupational Stress Questionnaire    Feeling of Stress : To some extent  Social Connections: Unknown (09/19/2019)   Social Connection and Isolation Panel [NHANES]    Frequency of Communication with Friends and Family: More than three times a week    Frequency of Social Gatherings with Friends and Family: Once a week    Attends Religious Services: Not on Marketing executive or Organizations: Not on file    Attends Banker Meetings: Not on file    Marital Status: Married   Family History  Problem Relation Age of Onset   Suicidality Mother    Heart attack Father    Cancer - Other Daughter        throat cancer    Thyroid disease Neg Hx    Allergies  Allergen Reactions   Nitrofurantoin Itching    Head to toe itching   Sulfamethoxazole     Unknown reaction    Prior to Admission medications   Medication Sig Start Date End Date Taking? Authorizing Provider  amLODipine (NORVASC) 5 MG tablet TAKE 1 TABLET(5 MG) BY MOUTH DAILY 08/13/22   [name], Neta Mends, MD  Ascorbic Acid (VITAMIN C) 500 MG tablet Take 500 mg by  mouth 2 (two) times daily.     [provider]  Calcium Carb-Cholecalciferol 600-800 MG-UNIT TABS Take 1 tablet by mouth daily.    [provider]  letrozole (FEMARA) 2.5 MG tablet TAKE 1 TABLET(2.5 MG) BY MOUTH DAILY 11/10/22   Serena Croissant, MD  loratadine (CLARITIN) 10 MG tablet Take 10 mg by mouth daily as needed for allergies.    [provider]  MELATONIN PO Take 1 tablet by mouth at bedtime as needed (sleep).    [provider]  Multiple Vitamins-Minerals (CENTRUM  SILVER PO) Take 1 tablet by mouth daily.    [provider]   DG Pelvis 1-2 Views  Result Date: 12/31/2022 CLINICAL DATA:  Fall. EXAM: PELVIS - 1-2 VIEW COMPARISON:  None Available. FINDINGS: Severely displaced and comminuted fracture is seen involving intertrochanteric region of proximal right femur. No other fracture or dislocation is noted. IMPRESSION: Severely displaced and comminuted intertrochanteric fracture of proximal right femur. Electronically Signed   By: Lupita Raider M.D.   On: 12/31/2022 12:40   DG Femur Min 2 Views Right  Result Date: 12/31/2022 CLINICAL DATA:  Fall. EXAM: RIGHT FEMUR 2 VIEWS COMPARISON:  None Available. FINDINGS: Severely displaced and comminuted intertrochanteric fracture of proximal right femur is noted. Middle and distal portions of right humerus are unremarkable. IMPRESSION: Severely displaced and comminuted intertrochanteric fracture of proximal right femur. Electronically Signed   By: Lupita Raider M.D.   On: 12/31/2022 12:39   DG Chest Portable 1 View  Result Date: 12/31/2022 CLINICAL DATA:  fall EXAM: PORTABLE CHEST 1 VIEW COMPARISON:  CXR 02/16/11 FINDINGS: Left axillary surgical clips. Cardiomegaly. No pleural effusion. No pneumothorax. There are prominent bilateral opacities that could represent pulmonary venous congestion or atypical infection. No focal airspace opacity. No radiographically apparent displaced rib fractures degenerative  changes of the bilateral glenohumeral joints. Degenerative changes in the visualized lumbar spine. IMPRESSION: Cardiomegaly and pulmonary venous congestion. Electronically Signed   By: Lorenza Cambridge M.D.   On: 12/31/2022 12:38    Positive ROS: All other systems have been reviewed and were otherwise negative with the exception of those mentioned in the HPI and as above.  Physical Exam: General: Alert, no acute distress Cardiovascular: No pedal edema Respiratory: No cyanosis, no use of accessory musculature GI: No organomegaly, abdomen is soft and non-tender Skin: No lesions in the area of chief complaint Neurologic: Sensation intact distally Psychiatric: Patient is competent for consent with normal mood and affect Lymphatic: No axillary or cervical lymphadenopathy  MUSCULOSKELETAL:   Examination of the right hip reveals no skin wounds or lesions. Thigh swelling noted. Compartment soft. Pain with palpation and any hip movement.   Sensory and motor function intact in LE bilaterally including plantar flexion, dorsiflexion, and EHL. Calves soft and non-tender. No significant pedal edema. Distal pedal pulses 2+ bilaterally. Capillary refill <2 seconds.   Assessment: Comminuted right intertrochanteric femur fracture.   Plan: I discussed the findings with the patient and her family. She has a comminuted right intertrochanteric femur fracture. She will require surgical treatment for pain control and to allow immediate mobilization out of bed. Admitted by [facility] for perioperative risk stratification and medical optimization. Plan for IM nail right femur tomorrow if OR schedule allows. NPO after MN. Hold chemical DVT ppx. Discussed R/B/A. See statement of risk. All questions solicited and answered.   The risks, benefits, and alternatives were discussed with the patient. There are risks associated with the surgery including, but not limited to, problems with anesthesia (death), infection, differences in  leg length/angulation/rotation, fracture of bones, loosening or failure of implants, malunion, nonunion, hematoma (blood accumulation) which may require surgical drainage, blood clots, pulmonary embolism, nerve injury (foot drop), and blood vessel injury. The patient understands these risks and elects to proceed.     Clois Dupes, PA-C    12/31/2022 8:23 PM

## 2023-01-01 ENCOUNTER — Other Ambulatory Visit (HOSPITAL_COMMUNITY): Payer: Medicare Other

## 2023-01-01 ENCOUNTER — Encounter (HOSPITAL_COMMUNITY)
Admission: EM | Disposition: A | Discharge status: Rehabilitation Facility | Payer: Self-pay | Source: Home / Self Care | Attending: Internal Medicine

## 2023-01-01 ENCOUNTER — Inpatient Hospital Stay (HOSPITAL_COMMUNITY): Payer: Medicare Other

## 2023-01-01 ENCOUNTER — Encounter (HOSPITAL_COMMUNITY): Payer: Self-pay | Admitting: *Deleted

## 2023-01-01 ENCOUNTER — Inpatient Hospital Stay (HOSPITAL_COMMUNITY): Payer: Medicare Other | Admitting: Anesthesiology

## 2023-01-01 DIAGNOSIS — Z87891 Personal history of nicotine dependence: Secondary | ICD-10-CM | POA: Diagnosis not present

## 2023-01-01 DIAGNOSIS — S72001A Fracture of unspecified part of neck of right femur, initial encounter for closed fracture: Secondary | ICD-10-CM | POA: Diagnosis not present

## 2023-01-01 DIAGNOSIS — I10 Essential (primary) hypertension: Secondary | ICD-10-CM | POA: Diagnosis not present

## 2023-01-01 DIAGNOSIS — S72141A Displaced intertrochanteric fracture of right femur, initial encounter for closed fracture: Secondary | ICD-10-CM

## 2023-01-01 HISTORY — PX: INTRAMEDULLARY (IM) NAIL INTERTROCHANTERIC: SHX5875

## 2023-01-01 LAB — GLUCOSE, CAPILLARY
Glucose-Capillary: 165 mg/dL — ABNORMAL HIGH (ref 70–99)
Glucose-Capillary: 99 mg/dL (ref 70–99)
Glucose-Capillary: 171 mg/dL — ABNORMAL HIGH (ref 70–99)
Glucose-Capillary: 210 mg/dL — ABNORMAL HIGH (ref 70–99)

## 2023-01-01 LAB — TYPE AND SCREEN
ABO/RH(D): A POS
Antibody Screen: NEGATIVE

## 2023-01-01 LAB — COMPREHENSIVE METABOLIC PANEL
ALT: 26 U/L (ref 0–44)
AST: 30 U/L (ref 15–41)
Albumin: 3.8 g/dL (ref 3.5–5.0)
Alkaline Phosphatase: 49 U/L (ref 38–126)
Anion gap: 10 (ref 5–15)
BUN: 20 mg/dL (ref 8–23)
CO2: 21 mmol/L — ABNORMAL LOW (ref 22–32)
Calcium: 8.6 mg/dL — ABNORMAL LOW (ref 8.9–10.3)
Chloride: 107 mmol/L (ref 98–111)
Creatinine, Ser: 0.69 mg/dL (ref 0.44–1.00)
GFR, Estimated: >60 mL/min (ref >60)
Glucose, Bld: 177 mg/dL — ABNORMAL HIGH (ref 70–99)
Potassium: 4.2 mmol/L (ref 3.5–5.1)
Sodium: 138 mmol/L (ref 135–145)
Total Bilirubin: 1.4 mg/dL — ABNORMAL HIGH (ref 0.3–1.2)
Total Protein: 6.5 g/dL (ref 6.5–8.1)

## 2023-01-01 LAB — CBC
HCT: 38.2 % (ref 36.0–46.0)
Hemoglobin: 12.7 g/dL (ref 12.0–15.0)
MCH: 31.9 pg (ref 26.0–34.0)
MCHC: 33.2 g/dL (ref 30.0–36.0)
MCV: 96 fL (ref 80.0–100.0)
Platelets: 147 10*3/uL — ABNORMAL LOW (ref 150–400)
RBC: 3.98 MIL/uL (ref 3.87–5.11)
RDW: 12.7 % (ref 11.5–15.5)
WBC: 11.1 10*3/uL — ABNORMAL HIGH (ref 4.0–10.5)
nRBC: 0 % (ref 0.0–0.2)

## 2023-01-01 LAB — ABO/RH: ABO/RH(D): A POS

## 2023-01-01 SURGERY — FIXATION, FRACTURE, INTERTROCHANTERIC, WITH INTRAMEDULLARY ROD
Anesthesia: General | Laterality: Right

## 2023-01-01 MED ORDER — ROCURONIUM BROMIDE 10 MG/ML (PF) SYRINGE
PREFILLED_SYRINGE | INTRAVENOUS | Status: AC
  Filled 2023-01-01: qty 10

## 2023-01-01 MED ORDER — VITAMIN C 500 MG PO TABS
500.0000 mg | ORAL_TABLET | Freq: Every day | ORAL | Status: DC
  Administered 2023-01-02 – 2023-01-07 (×6): 500 mg via ORAL
  Filled 2023-01-01 (×6): qty 1

## 2023-01-01 MED ORDER — PHENYLEPHRINE HCL-NACL 20-0.9 MG/250ML-% IV SOLN
INTRAVENOUS | Status: AC
  Filled 2023-01-01: qty 250

## 2023-01-01 MED ORDER — METOCLOPRAMIDE HCL 5 MG PO TABS: 5.0000 mg | ORAL_TABLET | Freq: Three times a day (TID) | ORAL | Status: DC | PRN

## 2023-01-01 MED ORDER — HYDROCODONE-ACETAMINOPHEN 7.5-325 MG PO TABS: 1.0000 | ORAL_TABLET | ORAL | Status: DC | PRN

## 2023-01-01 MED ORDER — ONDANSETRON HCL 4 MG/2ML IJ SOLN
INTRAMUSCULAR | Status: AC
  Filled 2023-01-01: qty 2

## 2023-01-01 MED ORDER — OYSTER SHELL CALCIUM/D3 500-5 MG-MCG PO TABS
2.0000 | ORAL_TABLET | Freq: Every day | ORAL | Status: DC
  Administered 2023-01-02 – 2023-01-07 (×6): 2 via ORAL
  Filled 2023-01-01 (×6): qty 2

## 2023-01-01 MED ORDER — CEFAZOLIN SODIUM-DEXTROSE 2-4 GM/100ML-% IV SOLN
2.0000 g | Freq: Four times a day (QID) | INTRAVENOUS | Status: AC
  Administered 2023-01-01 – 2023-01-02 (×2): 2 g via INTRAVENOUS
  Filled 2023-01-01 (×2): qty 100

## 2023-01-01 MED ORDER — METOCLOPRAMIDE HCL 5 MG/ML IJ SOLN: 5.0000 mg | Freq: Three times a day (TID) | INTRAMUSCULAR | Status: DC | PRN

## 2023-01-01 MED ORDER — PHENOL 1.4 % MT LIQD: 1.0000 | OROMUCOSAL | Status: DC | PRN

## 2023-01-01 MED ORDER — LIDOCAINE 2% (20 MG/ML) 5 ML SYRINGE
INTRAMUSCULAR | Status: DC | PRN
  Administered 2023-01-01: 60 mg via INTRAVENOUS

## 2023-01-01 MED ORDER — HYDROCODONE-ACETAMINOPHEN 5-325 MG PO TABS: 1.0000 | ORAL_TABLET | ORAL | 0 refills | Status: DC | PRN

## 2023-01-01 MED ORDER — DOCUSATE SODIUM 100 MG PO CAPS
100.0000 mg | ORAL_CAPSULE | Freq: Two times a day (BID) | ORAL | Status: DC
  Administered 2023-01-01 – 2023-01-07 (×10): 100 mg via ORAL
  Filled 2023-01-01 (×10): qty 1

## 2023-01-01 MED ORDER — ONDANSETRON HCL 4 MG/2ML IJ SOLN
INTRAMUSCULAR | Status: DC | PRN
  Administered 2023-01-01: 4 mg via INTRAVENOUS

## 2023-01-01 MED ORDER — ONDANSETRON HCL 4 MG/2ML IJ SOLN: 4.0000 mg | Freq: Once | INTRAMUSCULAR | Status: DC | PRN

## 2023-01-01 MED ORDER — SODIUM CHLORIDE 0.9 % IR SOLN
Status: DC | PRN
  Administered 2023-01-01: 1000 mL

## 2023-01-01 MED ORDER — ROCURONIUM BROMIDE 10 MG/ML (PF) SYRINGE
PREFILLED_SYRINGE | INTRAVENOUS | Status: DC | PRN
  Administered 2023-01-01: 10 mg via INTRAVENOUS
  Administered 2023-01-01: 20 mg via INTRAVENOUS
  Administered 2023-01-01: 50 mg via INTRAVENOUS

## 2023-01-01 MED ORDER — ISOPROPYL ALCOHOL 70 % SOLN
Status: DC | PRN
  Administered 2023-01-01: 1 via TOPICAL

## 2023-01-01 MED ORDER — STERILE WATER FOR IRRIGATION IR SOLN
Status: DC | PRN
  Administered 2023-01-01: 2000 mL

## 2023-01-01 MED ORDER — PROPOFOL 10 MG/ML IV BOLUS
INTRAVENOUS | Status: AC
  Filled 2023-01-01: qty 20

## 2023-01-01 MED ORDER — ADULT MULTIVITAMIN W/MINERALS CH
1.0000 | ORAL_TABLET | Freq: Every day | ORAL | Status: DC
  Administered 2023-01-02 – 2023-01-07 (×6): 1 via ORAL
  Filled 2023-01-01 (×6): qty 1

## 2023-01-01 MED ORDER — CHLORHEXIDINE GLUCONATE 0.12 % MT SOLN
15.0000 mL | Freq: Once | OROMUCOSAL | Status: AC
  Administered 2023-01-01: 15 mL via OROMUCOSAL

## 2023-01-01 MED ORDER — PHENYLEPHRINE HCL-NACL 20-0.9 MG/250ML-% IV SOLN
INTRAVENOUS | Status: DC | PRN
  Administered 2023-01-01: 25 ug/min via INTRAVENOUS

## 2023-01-01 MED ORDER — POTASSIUM CHLORIDE IN NACL 20-0.9 MEQ/L-% IV SOLN
INTRAVENOUS | Status: DC
  Filled 2023-01-01 (×4): qty 1000

## 2023-01-01 MED ORDER — ACETAMINOPHEN 325 MG PO TABS: 325.0000 mg | ORAL_TABLET | Freq: Four times a day (QID) | ORAL | Status: DC | PRN

## 2023-01-01 MED ORDER — OXYCODONE HCL 5 MG/5ML PO SOLN: 5.0000 mg | Freq: Once | ORAL | Status: DC | PRN

## 2023-01-01 MED ORDER — MORPHINE SULFATE (PF) 2 MG/ML IV SOLN: 0.5000 mg | INTRAVENOUS | Status: DC | PRN

## 2023-01-01 MED ORDER — PROPOFOL 10 MG/ML IV BOLUS
INTRAVENOUS | Status: DC | PRN
  Administered 2023-01-01: 90 mg via INTRAVENOUS

## 2023-01-01 MED ORDER — METHOCARBAMOL 500 MG IVPB - SIMPLE MED: 500.0000 mg | Freq: Four times a day (QID) | INTRAVENOUS | Status: DC | PRN

## 2023-01-01 MED ORDER — ORAL CARE MOUTH RINSE: 15.0000 mL | Freq: Once | OROMUCOSAL | Status: AC

## 2023-01-01 MED ORDER — FENTANYL CITRATE (PF) 250 MCG/5ML IJ SOLN
INTRAMUSCULAR | Status: AC
  Filled 2023-01-01: qty 5

## 2023-01-01 MED ORDER — FENTANYL CITRATE PF 50 MCG/ML IJ SOSY
25.0000 ug | PREFILLED_SYRINGE | INTRAMUSCULAR | Status: DC | PRN
  Administered 2023-01-01 (×3): 25 ug via INTRAVENOUS

## 2023-01-01 MED ORDER — FENTANYL CITRATE PF 50 MCG/ML IJ SOSY
PREFILLED_SYRINGE | INTRAMUSCULAR | Status: AC
  Filled 2023-01-01: qty 1

## 2023-01-01 MED ORDER — SODIUM CHLORIDE (PF) 0.9 % IJ SOLN
INTRAMUSCULAR | Status: AC
  Filled 2023-01-01: qty 10

## 2023-01-01 MED ORDER — HYDROCODONE-ACETAMINOPHEN 5-325 MG PO TABS
1.0000 | ORAL_TABLET | ORAL | Status: DC | PRN
  Administered 2023-01-03 – 2023-01-05 (×4): 1 via ORAL
  Filled 2023-01-01 (×4): qty 1

## 2023-01-01 MED ORDER — MENTHOL 3 MG MT LOZG: 1.0000 | LOZENGE | OROMUCOSAL | Status: DC | PRN

## 2023-01-01 MED ORDER — ASPIRIN 81 MG PO CHEW: 81.0000 mg | CHEWABLE_TABLET | Freq: Two times a day (BID) | ORAL | 0 refills | Status: DC

## 2023-01-01 MED ORDER — LIDOCAINE HCL (PF) 2 % IJ SOLN
INTRAMUSCULAR | Status: AC
  Filled 2023-01-01: qty 5

## 2023-01-01 MED ORDER — SENNA 8.6 MG PO TABS
1.0000 | ORAL_TABLET | Freq: Two times a day (BID) | ORAL | Status: DC
  Administered 2023-01-01 – 2023-01-07 (×9): 8.6 mg via ORAL
  Filled 2023-01-01 (×9): qty 1

## 2023-01-01 MED ORDER — DEXAMETHASONE SODIUM PHOSPHATE 10 MG/ML IJ SOLN
INTRAMUSCULAR | Status: AC
  Filled 2023-01-01: qty 1

## 2023-01-01 MED ORDER — DEXAMETHASONE SODIUM PHOSPHATE 10 MG/ML IJ SOLN
INTRAMUSCULAR | Status: DC | PRN
  Administered 2023-01-01: 5 mg via INTRAVENOUS

## 2023-01-01 MED ORDER — METHOCARBAMOL 500 MG PO TABS
500.0000 mg | ORAL_TABLET | Freq: Four times a day (QID) | ORAL | Status: DC | PRN
  Administered 2023-01-06: 500 mg via ORAL
  Filled 2023-01-01 (×2): qty 1

## 2023-01-01 MED ORDER — ONDANSETRON HCL 4 MG PO TABS: 4.0000 mg | ORAL_TABLET | Freq: Four times a day (QID) | ORAL | Status: DC | PRN

## 2023-01-01 MED ORDER — FENTANYL CITRATE (PF) 250 MCG/5ML IJ SOLN
INTRAMUSCULAR | Status: DC | PRN
  Administered 2023-01-01 (×2): 50 ug via INTRAVENOUS
  Administered 2023-01-01: 100 ug via INTRAVENOUS

## 2023-01-01 MED ORDER — ONDANSETRON HCL 4 MG/2ML IJ SOLN: 4.0000 mg | Freq: Four times a day (QID) | INTRAMUSCULAR | Status: DC | PRN

## 2023-01-01 MED ORDER — LACTATED RINGERS IV SOLN: INTRAVENOUS | Status: DC

## 2023-01-01 MED ORDER — ENOXAPARIN SODIUM 40 MG/0.4ML IJ SOSY
40.0000 mg | PREFILLED_SYRINGE | INTRAMUSCULAR | Status: DC
  Administered 2023-01-02 – 2023-01-07 (×6): 40 mg via SUBCUTANEOUS
  Filled 2023-01-01 (×6): qty 0.4

## 2023-01-01 MED ORDER — OXYCODONE HCL 5 MG PO TABS: 5.0000 mg | ORAL_TABLET | Freq: Once | ORAL | Status: DC | PRN

## 2023-01-01 MED ORDER — SUGAMMADEX SODIUM 200 MG/2ML IV SOLN
INTRAVENOUS | Status: DC | PRN
  Administered 2023-01-01: 150 mg via INTRAVENOUS

## 2023-01-01 SURGICAL SUPPLY — 51 items
ADH SKN CLS APL DERMABOND .7 (GAUZE/BANDAGES/DRESSINGS) ×1
APL PRP STRL LF DISP 70% ISPRP (MISCELLANEOUS) ×1
BAG COUNTER SPONGE SURGICOUNT (BAG) IMPLANT
BAG SPEC THK2 15X12 ZIP CLS (MISCELLANEOUS)
BAG SPNG CNTER NS LX DISP (BAG)
BAG ZIPLOCK 12X15 (MISCELLANEOUS) IMPLANT
BIT DRILL CANN LG 4.3MM (BIT) IMPLANT
CHLORAPREP W/TINT 26 (MISCELLANEOUS) ×2 IMPLANT
COVER PERINEAL POST (MISCELLANEOUS) ×2 IMPLANT
COVER SURGICAL LIGHT HANDLE (MISCELLANEOUS) ×2 IMPLANT
DERMABOND ADVANCED .7 DNX12 (GAUZE/BANDAGES/DRESSINGS) ×2 IMPLANT
DRAPE C-ARM 42X120 X-RAY (DRAPES) ×2 IMPLANT
DRAPE C-ARMOR (DRAPES) ×2 IMPLANT
DRAPE IMP U-DRAPE 54X76 (DRAPES) ×4 IMPLANT
DRAPE SHEET LG 3/4 BI-LAMINATE (DRAPES) ×6 IMPLANT
DRAPE STERI IOBAN 125X83 (DRAPES) ×2 IMPLANT
DRAPE U-SHAPE 47X51 STRL (DRAPES) ×4 IMPLANT
DRESSING MEPILEX FLEX 4X4 (GAUZE/BANDAGES/DRESSINGS) ×4 IMPLANT
DRILL BIT CANN LG 4.3MM (BIT) ×1
DRSG AQUACEL AG ADV 3.5X 4 (GAUZE/BANDAGES/DRESSINGS) IMPLANT
DRSG AQUACEL AG ADV 3.5X 6 (GAUZE/BANDAGES/DRESSINGS) IMPLANT
DRSG MEPILEX FLEX 4X4 (GAUZE/BANDAGES/DRESSINGS)
DRSG MEPILEX POST OP 4X8 (GAUZE/BANDAGES/DRESSINGS) IMPLANT
ELECT BLADE TIP CTD 4 INCH (ELECTRODE) IMPLANT
FACESHIELD WRAPAROUND (MASK) ×1 IMPLANT
FACESHIELD WRAPAROUND OR TEAM (MASK) ×2 IMPLANT
GAUZE SPONGE 4X4 12PLY STRL (GAUZE/BANDAGES/DRESSINGS) ×2 IMPLANT
GLOVE BIO SURGEON STRL SZ8.5 (GLOVE) ×4 IMPLANT
GLOVE BIOGEL M 7.0 STRL (GLOVE) ×2 IMPLANT
GLOVE BIOGEL PI IND STRL 7.5 (GLOVE) ×2 IMPLANT
GLOVE BIOGEL PI IND STRL 8 (GLOVE) ×2 IMPLANT
GLOVE BIOGEL PI IND STRL 8.5 (GLOVE) ×2 IMPLANT
GLOVE SURG LX STRL 7.5 STRW (GLOVE) ×4 IMPLANT
GOWN SPEC L3 XXLG W/TWL (GOWN DISPOSABLE) ×2 IMPLANT
GUIDEPIN VERSANAIL DSP 3.2X444 (ORTHOPEDIC DISPOSABLE SUPPLIES) IMPLANT
HFN 125 DEG 11MM X 180MM (Orthopedic Implant) IMPLANT
HOOD PEEL AWAY T7 (MISCELLANEOUS) ×2 IMPLANT
KIT BASIN OR (CUSTOM PROCEDURE TRAY) ×2 IMPLANT
KIT TURNOVER KIT A (KITS) IMPLANT
MANIFOLD NEPTUNE II (INSTRUMENTS) ×2 IMPLANT
MARKER SKIN DUAL TIP RULER LAB (MISCELLANEOUS) ×2 IMPLANT
PACK GENERAL/GYN (CUSTOM PROCEDURE TRAY) ×2 IMPLANT
SCREW BONE CORTICAL 5.0X3 (Screw) IMPLANT
SCREW LAG HIP NAIL 10.5X95 (Screw) IMPLANT
STAPLER VISISTAT 35W (STAPLE) IMPLANT
SUT MNCRL AB 3-0 PS2 18 (SUTURE) ×2 IMPLANT
SUT MON AB 2-0 CT1 36 (SUTURE) ×2 IMPLANT
SUT VIC AB 1 CT1 36 (SUTURE) ×2 IMPLANT
TOWEL OR 17X26 10 PK STRL BLUE (TOWEL DISPOSABLE) ×2 IMPLANT
TRAY FOL W/BAG SLVR 16FR STRL (SET/KITS/TRAYS/PACK) IMPLANT
TRAY FOLEY W/BAG SLVR 16FR LF (SET/KITS/TRAYS/PACK) ×1

## 2023-01-01 NOTE — Interval H&P Note (Signed)
History and Physical Interval Note:  01/01/2023 12:56 PM  [name] [name]  has presented today for surgery, with the diagnosis of RIGHT HIP FRACTURE.  The various methods of treatment have been discussed with the patient and family. After consideration of risks, benefits and other options for treatment, the patient has consented to  Procedure(s): INTRAMEDULLARY (IM) NAIL INTERTROCHANTERIC (Right) as a surgical intervention.  The patient's history has been reviewed, patient examined, no change in status, stable for surgery.  I have reviewed the patient's chart and labs.  Questions were answered to the patient's satisfaction.     [name]

## 2023-01-01 NOTE — Op Note (Signed)
OPERATIVE REPORT  SURGEON: Samson Frederic, MD   ASSISTANT: Clint Bolder, PA-C.  PREOPERATIVE DIAGNOSIS: Right intertrochanteric femur fracture.   POSTOPERATIVE DIAGNOSIS: Right intertrochanteric femur fracture.   PROCEDURE: Intramedullary fixation, Right femur.   IMPLANTS: Biomet Affixus Hip Fracture Nail, 11 by 180 mm, 125 degrees. 10.5 x 95 mm Hip Fracture Nail Lag Screw. 5 x 34 mm distal interlocking screw 1.  ANESTHESIA:  General  ESTIMATED BLOOD LOSS:-50 mL    ANTIBIOTICS:  2 g Ancef.  DRAINS: None.  COMPLICATIONS: None.   CONDITION: PACU - hemodynamically stable.  BRIEF CLINICAL NOTE: [name] [name] is a 89 y.o. female who presented with an intertrochanteric femur fracture. The patient was admitted to the hospitalist service and underwent perioperative risk stratification and medical optimization. The risks, benefits, and alternatives to the procedure were explained, and the patient elected to proceed.  PROCEDURE IN DETAIL: Surgical site was marked by myself. The patient was taken to the operating room and anesthesia was induced on the bed. The patient was then transferred to the [facility] table and the nonoperative lower extremity was scissored underneath the operative side. The fracture was reduced with traction, internal rotation, and adduction. The hip was prepped and draped in the normal sterile surgical fashion. Timeout was called verifying side and site of surgery. Preop antibiotics were given with 60 minutes of beginning the procedure.  Fluoroscopy was used to define the patient's anatomy. A 4 cm incision was made just proximal to the tip of the greater trochanter. The awl was used to obtain the standard starting point for a trochanteric entry nail under fluoroscopic control. The guidepin was placed. The entry reamer was used to open the proximal femur.  On the back table, the nail was assembled onto the jig. The nail was placed into the femur without any difficulty.  Through a separate stab incision, the cannula was placed down to the bone in preparation for the cephalomedullary device. A guidepin was placed into the femoral head using AP and lateral fluoroscopy views. The pin was measured, and then reaming was performed to the appropriate depth. The lag screw was inserted to the appropriate depth. The fracture was compressed through the jig. The setscrew was tightened and then loosened one quarter turn. A separate stab incision was created, and the distal interlocking screw was placed using standard AO technique. The jig was removed. Final AP and lateral fluoroscopy views were obtained to confirm fracture reduction and hardware placement. Tip apex distance was appropriate. There was no chondral penetration.  The wounds were copiously irrigated with saline. The wound was closed in layers with #1 Vicryl for the fascia, 2-0 Monocryl for the deep dermal layer, and 3-0 Monocryl subcuticular stitch. Glue was applied to the skin. Once the glue was fully hardened, sterile dressing was applied. The patient was then awakened from anesthesia and taken to the PACU in stable condition. Sponge needle and instrument counts were correct at the end of the case 2. There were no known complications.  We will readmit the patient to the hospitalist. Weightbearing status will be weightbearing as tolerated with a walker. We will begin Lovenox for DVT prophylaxis while in house, and d/c home on ASA 81 mg PO BID. The patient will mobilize out of bed with physical therapy and undergo disposition planning.  Please note that a surgical assistant was a medical necessity for this procedure to perform it in a safe and expeditious manner. Assistant was necessary to provide appropriate retraction of vital neurovascular structures, to  prevent femoral fracture, and to allow for anatomic placement of the prosthesis.

## 2023-01-01 NOTE — Progress Notes (Signed)
PROGRESS NOTE    [name] [name]  ZOX:[id number] DOB: 1932-06-01 DOA: 12/31/2022 PCP: Madelin Headings, MD   Brief Narrative:  [name] [name] is a 89 y.o. female with medical history significant of colon polyps, left breast cancer, interstitial cystitis, urinary incontinence, left ankle fracture, hearing loss, hyperlipidemia, hypertension, osteoarthritis right rotator cuff tear, herpes zoster who presented to the emergency department with right hip pain after mechanical fall. Imaging consistent with R femur intertrochanteric fracture. Orthopedics consulted, hospitalist called for admission.  Assessment & Plan:   Principal Problem:   Closed right hip fracture, initial encounter [facility]) Active Problems:   Hyperlipidemia   Essential hypertension   Constipation   INTERSTITIAL CYSTITIS   Malignant neoplasm of upper-inner quadrant of right breast in female, estrogen receptor positive (HCC)   Hypocalcemia   Prediabetes  Closed right hip fracture, initial encounter (HCC) -Orthopedics following -plan for IM nail on the right femur 01/01/2023 -Pain currently well-controlled -Supportive care ongoing -PT OT evaluation postoperatively -tentative plan dispo to SNF -Anesthesia has performed area nerve block.   Essential hypertension Continue amlodipine 5 mg p.o. daily.   Constipation Continue Senokot  Interstitial cystitis with concurrent incontinence  Urinalysis remarkable for proteinuria and ketonuria   Malignant neoplasm of upper-inner quadrant  of right breast in female, estrogen receptor positive (HCC) Continue letrozole 2.5 mg p.o. daily. Follow-up with oncology as an outpatient.   Hypocalcemia Last vitamin D level was normal. Calcium level stable, likely to improve once p.o. intake is restarted     Prediabetes Carbohydrate modified diet. CBG monitoring with RI SS while in the hospital.   Hyperlipidemia Currently not on therapy. Follow-up with primary care  provider.   DVT prophylaxis: Perioperatively per orthopedics Code Status: DNR Family Communication: Son at bedside  Status is: Inpatient  Dispo: The patient is from: Home              Anticipated d/c is to: Likely SNF pending PT recs              Anticipated d/c date is: 72+ hours              Patient currently not medically stable for discharge  Consultants:  Orthopedic surgery  Procedures:  ORIF right IM nail 5/31  Antimicrobials:  Perioperatively  Subjective: No acute issues or events overnight denies nausea vomiting diarrhea constipation headache fevers chills or chest pain.  Objective: Vitals:   12/31/22 1723 12/31/22 2200 01/01/23 0131 01/01/23 0504  BP:  122/70 119/68 (!) 141/77  Pulse:  74 68 83  Resp:  16 16 17   Temp:  98 F (36.7 C) 97.7 F (36.5 C) 98 F (36.7 C)  TempSrc:  Oral Oral Oral  SpO2:  94% 94% 94%  Weight: 54.2 kg     Height: 5' 2.01" (1.575 m)       Intake/Output Summary (Last 24 hours) at 01/01/2023 0756 Last data filed at 12/31/2022 2200 Gross per 24 hour  Intake 240 ml  Output 300 ml  Net -60 ml   Filed Weights   12/31/22 1723  Weight: 54.2 kg    Examination:  General exam: Appears calm and comfortable  Respiratory system: Clear to auscultation. Respiratory effort normal. Cardiovascular system: S1 & S2 heard, RRR. No JVD, murmurs, rubs, gallops or clicks. No pedal edema. Gastrointestinal system: Abdomen is nondistended, soft and nontender. No organomegaly or masses felt. Normal bowel sounds heard. Central nervous system: Alert and oriented. No focal neurological deficits. Extremities: Symmetric 5 x 5  power. Skin: No rashes, lesions or ulcers Psychiatry: Judgement and insight appear normal. Mood & affect appropriate.     Data Reviewed: I have personally reviewed following labs and imaging studies  CBC: Recent Labs  Lab 12/31/22 1143 01/01/23 0401  WBC 7.5 11.1*  NEUTROABS 6.3  --   HGB 13.3 12.7  HCT 41.1 38.2  MCV  97.2 96.0  PLT 140* 147*   Basic Metabolic Panel: Recent Labs  Lab 12/31/22 1143 01/01/23 0401  NA 139 138  K 3.7 4.2  CL 107 107  CO2 22 21*  GLUCOSE 170* 177*  BUN 26* 20  CREATININE 0.73 0.69  CALCIUM 8.4* 8.6*  MG 2.0  --   PHOS 3.3  --    GFR: Estimated Creatinine Clearance: 37.7 mL/min (by C-G formula based on SCr of 0.69 mg/dL). Liver Function Tests: Recent Labs  Lab 12/31/22 1143 01/01/23 0401  AST 24 30  ALT 27 26  ALKPHOS 56 49  BILITOT 0.9 1.4*  PROT 6.8 6.5  ALBUMIN 4.0 3.8   No results for input(s): "LIPASE", "AMYLASE" in the last 168 hours. No results for input(s): "AMMONIA" in the last 168 hours. Coagulation Profile: Recent Labs  Lab 12/31/22 1330  INR 1.1   Cardiac Enzymes: No results for input(s): "CKTOTAL", "CKMB", "CKMBINDEX", "TROPONINI" in the last 168 hours. BNP (last 3 results) No results for input(s): "PROBNP" in the last 8760 hours. HbA1C: No results for input(s): "HGBA1C" in the last 72 hours. CBG: Recent Labs  Lab 12/31/22 1250 12/31/22 1946 12/31/22 2201 01/01/23 0747  GLUCAP 168* 166* 173* 165*   Lipid Profile: No results for input(s): "CHOL", "HDL", "LDLCALC", "TRIG", "CHOLHDL", "LDLDIRECT" in the last 72 hours. Thyroid Function Tests: No results for input(s): "TSH", "T4TOTAL", "FREET4", "T3FREE", "THYROIDAB" in the last 72 hours. Anemia Panel: No results for input(s): "VITAMINB12", "FOLATE", "FERRITIN", "TIBC", "IRON", "RETICCTPCT" in the last 72 hours. Sepsis Labs: No results for input(s): "PROCALCITON", "LATICACIDVEN" in the last 168 hours.  Recent Results (from the past 240 hour(s))  Surgical pcr screen     Status: Abnormal   Collection Time: 12/31/22  7:07 PM   Specimen: Nasal Mucosa; Nasal Swab  Result Value Ref Range Status   MRSA, PCR NEGATIVE NEGATIVE Final   Staphylococcus aureus POSITIVE (A) NEGATIVE Final    Comment: (NOTE) The Xpert SA Assay (FDA approved for NASAL specimens in patients 22 years of  age and older), is one component of a comprehensive surveillance program. It is not intended to diagnose infection nor to guide or monitor treatment. Performed at [facility], [address] [place], [place] [address]          Radiology Studies: DG Pelvis 1-2 Views  Result Date: 12/31/2022 CLINICAL DATA:  Fall. EXAM: PELVIS - 1-2 VIEW COMPARISON:  None Available. FINDINGS: Severely displaced and comminuted fracture is seen involving intertrochanteric region of proximal right femur. No other fracture or dislocation is noted. IMPRESSION: Severely displaced and comminuted intertrochanteric fracture of proximal right femur. Electronically Signed   By: Lupita Raider M.D.   On: 12/31/2022 12:40   DG Femur Min 2 Views Right  Result Date: 12/31/2022 CLINICAL DATA:  Fall. EXAM: RIGHT FEMUR 2 VIEWS COMPARISON:  None Available. FINDINGS: Severely displaced and comminuted intertrochanteric fracture of proximal right femur is noted. Middle and distal portions of right humerus are unremarkable. IMPRESSION: Severely displaced and comminuted intertrochanteric fracture of proximal right femur. Electronically Signed   By: Zenda Alpers.D.  On: 12/31/2022 12:39   DG Chest Portable 1 View  Result Date: 12/31/2022 CLINICAL DATA:  fall EXAM: PORTABLE CHEST 1 VIEW COMPARISON:  CXR 02/16/11 FINDINGS: Left axillary surgical clips. Cardiomegaly. No pleural effusion. No pneumothorax. There are prominent bilateral opacities that could represent pulmonary venous congestion or atypical infection. No focal airspace opacity. No radiographically apparent displaced rib fractures degenerative changes of the bilateral glenohumeral joints. Degenerative changes in the visualized lumbar spine. IMPRESSION: Cardiomegaly and pulmonary venous congestion. Electronically Signed   By: Lorenza Cambridge M.D.   On: 12/31/2022 12:38        Scheduled Meds:  acetaminophen  1,000 mg Oral Once   amLODipine  5 mg  Oral Daily   chlorhexidine  60 mL Topical Once   feeding supplement  296 mL Oral Once   insulin aspart  0-9 Units Subcutaneous TID WC   letrozole  2.5 mg Oral Daily   metoprolol succinate  12.5 mg Oral Daily   mupirocin ointment   Nasal BID   povidone-iodine  2 Application Topical Once   senna-docusate  1 tablet Oral BID   Continuous Infusions:   ceFAZolin (ANCEF) IV     tranexamic acid       LOS: 1 day   Time spent:  Azucena Fallen, DO [facility]  If 7PM-7AM, please contact night-coverage [email or web address]  01/01/2023, 7:56 AM

## 2023-01-01 NOTE — Progress Notes (Signed)
Initial Nutrition Assessment  DOCUMENTATION CODES:   Non-severe (moderate) malnutrition in context of chronic illness  INTERVENTION:  - RD recommends carb modified diet when appropriate - RD recommends EPHP daily   NUTRITION DIAGNOSIS:   Moderate Malnutrition related to chronic illness as evidenced by moderate muscle depletion, severe fat depletion, moderate fat depletion, severe muscle depletion, mild muscle depletion.   GOAL:   Patient will meet greater than or equal to 90% of their needs   MONITOR:   PO intake, Weight trends, Supplement acceptance, Skin, I & O's, Labs  REASON FOR ASSESSMENT:   Consult Hip fracture protocol  ASSESSMENT:   89 y.o. female with PMHx including colon polyps, L breast cancer, interstitial cystitis, urinary incontinence, L ankle fracture, hearing loss, HLD, HTN, osteoarthritis R rotator cuff tear, herpes zoster who presents with right hip pain after falling outside  Visited patient at bedside who reports good appetite and that she eats 2-3 meals per day. She reports UBW to be 120# for over 7 years now. Patient reports her MD wants her to gain some weight and RD supports this idea.   Plans for surgery today. RD encouraged her to add snacks into her daily eating routine for weight gain. She reports being on a fixed income and not being able to afford many protein supplements.   Labs:  Glu 177 Meds: ancef, insulin, senokot, ensure surgery  Wt: stable wt  12/31/22 54.2 kg  11/24/22 54.2 kg  05/20/22 53.3 kg  02/19/22 54.3 kg  11/03/21 54.5 kg  10/27/21 54.4 kg  10/24/21 53.9 kg  10/20/21 54.8 kg  PO: patient NPO for procedure  I/O's: -60 ml     NUTRITION - FOCUSED PHYSICAL EXAM:  Flowsheet Row Most Recent Value  Orbital Region Moderate depletion  Upper Arm Region Moderate depletion  Thoracic and Lumbar Region Moderate depletion  Buccal Region No depletion  Temple Region Moderate depletion  Clavicle Bone Region Severe depletion   Clavicle and Acromion Bone Region Severe depletion  Dorsal Hand Moderate depletion  Patellar Region Mild depletion  Anterior Thigh Region Mild depletion  Posterior Calf Region Mild depletion  Edema (RD Assessment) None  Hair Reviewed  Eyes Reviewed  Mouth Reviewed  Skin Reviewed  Nails Reviewed       Diet Order:   Diet Order             Diet NPO time specified Except for: Sips with Meds  Diet effective midnight                   EDUCATION NEEDS:   Education needs have been addressed  Skin:  Skin Assessment: Reviewed RN Assessment  Last BM:  5/30  Height:   Ht Readings from Last 1 Encounters:  12/31/22 5' 2.01" (1.575 m)    Weight:   Wt Readings from Last 1 Encounters:  12/31/22 54.2 kg    BMI:  Body mass index is 21.85 kg/m.  Estimated Nutritional Needs:   Kcal:  1600-1800 kcal  Protein:  65-80 g  Fluid:  > 1.6L    Leodis Rains, RDN, LDN  Clinical Nutrition

## 2023-01-01 NOTE — Plan of Care (Signed)
Problem: Clinical Measurements: Goal: Postoperative complications will be avoided or minimized Outcome: Progressing   Problem: Pain Management: Goal: Pain level will decrease Outcome: Progressing   Problem: Safety: Goal: Ability to remain free from injury will improve Outcome: Progressing   Haydee Salter, RN 01/01/23 7:56 AM

## 2023-01-01 NOTE — Plan of Care (Signed)
  Problem: Education: Goal: Verbalization of understanding the information provided (i.e., activity precautions, restrictions, etc) will improve Outcome: Progressing Goal: Individualized Educational Video(s) Outcome: Completed/Met   Problem: Activity: Goal: Ability to ambulate and perform ADLs will improve Outcome: Progressing   Problem: Clinical Measurements: Goal: Postoperative complications will be avoided or minimized Outcome: Progressing   Problem: Self-Concept: Goal: Ability to maintain and perform role responsibilities to the fullest extent possible will improve Outcome: Progressing   Problem: Pain Management: Goal: Pain level will decrease Outcome: Progressing   Problem: Education: Goal: Verbalization of understanding the information provided (i.e., activity precautions, restrictions, etc) will improve Outcome: Progressing Goal: Individualized Educational Video(s) Outcome: Progressing   Problem: Activity: Goal: Ability to ambulate and perform ADLs will improve Outcome: Progressing   Problem: Clinical Measurements: Goal: Postoperative complications will be avoided or minimized Outcome: Progressing   Problem: Self-Concept: Goal: Ability to maintain and perform role responsibilities to the fullest extent possible will improve Outcome: Progressing   Problem: Pain Management: Goal: Pain level will decrease Outcome: Progressing   Problem: Education: Goal: Knowledge of General Education information will improve Description: Including pain rating scale, medication(s)/side effects and non-pharmacologic comfort measures Outcome: Progressing   Problem: Health Behavior/Discharge Planning: Goal: Ability to manage health-related needs will improve Outcome: Progressing   Problem: Clinical Measurements: Goal: Ability to maintain clinical measurements within normal limits will improve Outcome: Progressing Goal: Will remain free from infection Outcome:  Progressing Goal: Diagnostic test results will improve Outcome: Progressing Goal: Respiratory complications will improve Outcome: Progressing Goal: Cardiovascular complication will be avoided Outcome: Progressing

## 2023-01-01 NOTE — Anesthesia Postprocedure Evaluation (Signed)
Anesthesia Post Note  Patient: [name] [name]  Procedure(s) Performed: INTRAMEDULLARY (IM) NAIL INTERTROCHANTERIC (Right)     Patient location during evaluation: PACU Anesthesia Type: General Level of consciousness: awake and alert Pain management: pain level controlled Vital Signs Assessment: post-procedure vital signs reviewed and stable Respiratory status: spontaneous breathing, nonlabored ventilation, respiratory function stable and patient connected to nasal cannula oxygen Cardiovascular status: blood pressure returned to baseline and stable Postop Assessment: no apparent nausea or vomiting Anesthetic complications: no  No notable events documented.  Last Vitals:  Vitals:   01/01/23 1439 01/01/23 1445  BP: (!) 142/62 128/84  Pulse: 72 74  Resp: 16 19  Temp: 36.6 C   SpO2: 99% 100%    Last Pain:  Vitals:   01/01/23 1238  TempSrc: Oral  PainSc:                  Taylon Louison S

## 2023-01-01 NOTE — Anesthesia Preprocedure Evaluation (Signed)
Anesthesia Evaluation  Patient identified by MRN, date of birth, ID band Patient awake    Reviewed: Allergy & Precautions, H&P , NPO status , Patient's Chart, lab work & pertinent test results  History of Anesthesia Complications (+) history of anesthetic complications  Airway Mallampati: II  TM Distance: >3 FB Neck ROM: Full    Dental no notable dental hx.    Pulmonary neg pulmonary ROS, former smoker   Pulmonary exam normal breath sounds clear to auscultation       Cardiovascular hypertension, Normal cardiovascular exam Rhythm:Regular Rate:Normal     Neuro/Psych negative neurological ROS  negative psych ROS   GI/Hepatic negative GI ROS, Neg liver ROS,,,  Endo/Other  negative endocrine ROS    Renal/GU negative Renal ROS  negative genitourinary   Musculoskeletal  (+) Arthritis , Osteoarthritis,    Abdominal   Peds negative pediatric ROS (+)  Hematology negative hematology ROS (+)   Anesthesia Other Findings   Reproductive/Obstetrics negative OB ROS                             Anesthesia Physical Anesthesia Plan  ASA: 2  Anesthesia Plan: General   Post-op Pain Management: Ofirmev IV (intra-op)*   Induction: Intravenous  PONV Risk Score and Plan: 3 and Ondansetron, Dexamethasone and Treatment may vary due to age or medical condition  Airway Management Planned: Oral ETT  Additional Equipment:   Intra-op Plan:   Post-operative Plan: Extubation in OR  Informed Consent: I have reviewed the patients History and Physical, chart, labs and discussed the procedure including the risks, benefits and alternatives for the proposed anesthesia with the patient or authorized representative who has indicated his/her understanding and acceptance.     Dental advisory given  Plan Discussed with: CRNA and Surgeon  Anesthesia Plan Comments:        Anesthesia Quick Evaluation

## 2023-01-01 NOTE — Anesthesia Procedure Notes (Signed)
Procedure Name: Intubation Date/Time: 01/01/2023 1:07 PM  Performed by: Elyn Peers, CRNAPre-anesthesia Checklist: Patient identified, Emergency Drugs available, Suction available, Patient being monitored and Timeout performed Patient Re-evaluated:Patient Re-evaluated prior to induction Oxygen Delivery Method: Circle system utilized Preoxygenation: Pre-oxygenation with 100% oxygen Induction Type: IV induction Ventilation: Mask ventilation without difficulty Laryngoscope Size: Miller and 3 Grade View: Grade I Tube type: Oral Tube size: 7.0 mm Number of attempts: 1 Airway Equipment and Method: Stylet Placement Confirmation: ETT inserted through vocal cords under direct vision, positive ETCO2 and breath sounds checked- equal and bilateral Secured at: 22 cm Tube secured with: Tape Dental Injury: Teeth and Oropharynx as per pre-operative assessment

## 2023-01-01 NOTE — Transfer of Care (Signed)
Immediate Anesthesia Transfer of Care Note  Patient: [name] [name]  Procedure(s) Performed: INTRAMEDULLARY (IM) NAIL INTERTROCHANTERIC (Right)  Patient Location: PACU  Anesthesia Type:General  Level of Consciousness: awake and alert   Airway & Oxygen Therapy: Patient Spontanous Breathing and Patient connected to face mask oxygen  Post-op Assessment: Report given to RN and Post -op Vital signs reviewed and stable  Post vital signs: Reviewed and stable  Last Vitals:  Vitals Value Taken Time  BP 142/62 01/01/23 1439  Temp    Pulse 73 01/01/23 1443  Resp 20 01/01/23 1443  SpO2 100 % 01/01/23 1443  Vitals shown include unvalidated device data.  Last Pain:  Vitals:   01/01/23 1238  TempSrc: Oral  PainSc:       Patients Stated Pain Goal: 3 (01/01/23 1232)  Complications: No notable events documented.

## 2023-01-01 NOTE — Discharge Instructions (Signed)
 Dr. Brian Swinteck Adult Hip & Knee Specialist [facility] [address] [place], [place] [address] [phone]   POSTOPERATIVE DIRECTIONS    Hip Rehabilitation, Guidelines Following Surgery   WEIGHT BEARING Weight bearing as tolerated with assist device (walker, cane, etc) as directed, use it as long as suggested by your surgeon or therapist, typically at least 4-6 weeks.   HOME CARE INSTRUCTIONS  Remove items at home which could result in a fall. This includes throw rugs or furniture in walking pathways.  Continue medications as instructed at time of discharge.  You may have some home medications which will be placed on hold until you complete the course of blood thinner medication.  4 days after discharge, you may start showering. No tub baths or soaking your incisions. Do not put on socks or shoes without following the instructions of your caregivers.   Sit on chairs with arms. Use the chair arms to help push yourself up when arising.  Arrange for the use of a toilet seat elevator so you are not sitting low.   Walk with walker as instructed.  You may resume a sexual relationship in one month or when given the OK by your caregiver.  Use walker as long as suggested by your caregivers.  Avoid periods of inactivity such as sitting longer than an hour when not asleep. This helps prevent blood clots.  You may return to work once you are cleared by your surgeon.  Do not drive a car for 6 weeks or until released by your surgeon.  Do not drive while taking narcotics.  Wear elastic stockings for two weeks following surgery during the day but you may remove then at night.  Make sure you keep all of your appointments after your operation with all of your doctors and caregivers. You should call the office at the above phone number and make an appointment for approximately two weeks after the date of your surgery. Please pick up a stool softener and laxative  for home use as long as you are requiring pain medications.  ICE to the affected hip every three hours for 30 minutes at a time and then as needed for pain and swelling. Continue to use ice on the hip for pain and swelling from surgery. You may notice swelling that will progress down to the foot and ankle.  This is normal after surgery.  Elevate the leg when you are not up walking on it.   It is important for you to complete the blood thinner medication as prescribed by your doctor.  Continue to use the breathing machine which will help keep your temperature down.  It is common for your temperature to cycle up and down following surgery, especially at night when you are not up moving around and exerting yourself.  The breathing machine keeps your lungs expanded and your temperature down.  RANGE OF MOTION AND STRENGTHENING EXERCISES  These exercises are designed to help you keep full movement of your hip joint. Follow your caregiver's or physical therapist's instructions. Perform all exercises about fifteen times, three times per day or as directed. Exercise both hips, even if you have had only one joint replacement. These exercises can be done on a training (exercise) mat, on the floor, on a table or on a bed. Use whatever works the best and is most comfortable for you. Use music or television while you are exercising so that the exercises are a pleasant break in your day. This   will make your life better with the exercises acting as a break in routine you can look forward to.  Lying on your back, slowly slide your foot toward your buttocks, raising your knee up off the floor. Then slowly slide your foot back down until your leg is straight again.  Lying on your back spread your legs as far apart as you can without causing discomfort.  Lying on your side, raise your upper leg and foot straight up from the floor as far as is comfortable. Slowly lower the leg and repeat.  Lying on your back, tighten up the  muscle in the front of your thigh (quadriceps muscles). You can do this by keeping your leg straight and trying to raise your heel off the floor. This helps strengthen the largest muscle supporting your knee.  Lying on your back, tighten up the muscles of your buttocks both with the legs straight and with the knee bent at a comfortable angle while keeping your heel on the floor.   SKILLED REHAB INSTRUCTIONS: If the patient is transferred to a skilled rehab facility following release from the hospital, a list of the current medications will be sent to the facility for the patient to continue.  When discharged from the skilled rehab facility, please have the facility set up the patient's Home Health Physical Therapy prior to being released. Also, the skilled facility will be responsible for providing the patient with their medications at time of release from the facility to include their pain medication and their blood thinner medication. If the patient is still at the rehab facility at time of the two week follow up appointment, the skilled rehab facility will also need to assist the patient in arranging follow up appointment in our office and any transportation needs.  MAKE SURE YOU:  Understand these instructions.  Will watch your condition.  Will get help right away if you are not doing well or get worse.  Pick up stool softner and laxative for home use following surgery while on pain medications. Daily dry dressing changes as needed. In 4 days, you may remove your dressings and begin taking showers - no tub baths or soaking the incisions. Continue to use ice for pain and swelling after surgery. Do not use any lotions or creams on the incision until instructed by your surgeon.   

## 2023-01-02 ENCOUNTER — Other Ambulatory Visit: Payer: Self-pay

## 2023-01-02 ENCOUNTER — Inpatient Hospital Stay (HOSPITAL_COMMUNITY): Payer: Medicare Other

## 2023-01-02 DIAGNOSIS — S72001A Fracture of unspecified part of neck of right femur, initial encounter for closed fracture: Secondary | ICD-10-CM | POA: Diagnosis not present

## 2023-01-02 DIAGNOSIS — R9431 Abnormal electrocardiogram [ECG] [EKG]: Secondary | ICD-10-CM

## 2023-01-02 DIAGNOSIS — E440 Moderate protein-calorie malnutrition: Secondary | ICD-10-CM

## 2023-01-02 DIAGNOSIS — I517 Cardiomegaly: Secondary | ICD-10-CM

## 2023-01-02 HISTORY — DX: Moderate protein-calorie malnutrition: E44.0

## 2023-01-02 LAB — CBC
HCT: 35.6 % — ABNORMAL LOW (ref 36.0–46.0)
Hemoglobin: 11.3 g/dL — ABNORMAL LOW (ref 12.0–15.0)
MCH: 32 pg (ref 26.0–34.0)
MCHC: 31.7 g/dL (ref 30.0–36.0)
MCV: 100.8 fL — ABNORMAL HIGH (ref 80.0–100.0)
Platelets: 143 10*3/uL — ABNORMAL LOW (ref 150–400)
RBC: 3.53 MIL/uL — ABNORMAL LOW (ref 3.87–5.11)
RDW: 13.2 % (ref 11.5–15.5)
WBC: 15.5 10*3/uL — ABNORMAL HIGH (ref 4.0–10.5)
nRBC: 0 % (ref 0.0–0.2)

## 2023-01-02 LAB — ECHOCARDIOGRAM COMPLETE
AR max vel: 1.95 cm2
AV Area VTI: 1.86 cm2
AV Area mean vel: 1.92 cm2
AV Mean grad: 4.5 mmHg
AV Peak grad: 8.7 mmHg
Ao pk vel: 1.48 m/s
Height: 62 in
S' Lateral: 3.1 cm
Weight: 1918.88 oz

## 2023-01-02 LAB — BASIC METABOLIC PANEL
Anion gap: 8 (ref 5–15)
BUN: 24 mg/dL — ABNORMAL HIGH (ref 8–23)
CO2: 24 mmol/L (ref 22–32)
Calcium: 8.1 mg/dL — ABNORMAL LOW (ref 8.9–10.3)
Chloride: 105 mmol/L (ref 98–111)
Creatinine, Ser: 0.81 mg/dL (ref 0.44–1.00)
GFR, Estimated: >60 mL/min (ref >60)
Glucose, Bld: 150 mg/dL — ABNORMAL HIGH (ref 70–99)
Potassium: 4.2 mmol/L (ref 3.5–5.1)
Sodium: 137 mmol/L (ref 135–145)

## 2023-01-02 LAB — GLUCOSE, CAPILLARY
Glucose-Capillary: 141 mg/dL — ABNORMAL HIGH (ref 70–99)
Glucose-Capillary: 115 mg/dL — ABNORMAL HIGH (ref 70–99)
Glucose-Capillary: 109 mg/dL — ABNORMAL HIGH (ref 70–99)

## 2023-01-02 MED ORDER — LORAZEPAM 2 MG/ML IJ SOLN
0.5000 mg | Freq: Four times a day (QID) | INTRAMUSCULAR | Status: DC | PRN
  Administered 2023-01-02: 1 mg via INTRAVENOUS
  Filled 2023-01-02: qty 1

## 2023-01-02 MED ORDER — ZIPRASIDONE HCL 20 MG PO CAPS
20.0000 mg | ORAL_CAPSULE | Freq: Two times a day (BID) | ORAL | Status: DC | PRN
  Filled 2023-01-02 (×2): qty 1

## 2023-01-02 NOTE — Progress Notes (Signed)
PT Cancellation Note  Patient Details Name: [name] [name] MRN: [id number] DOB: April 22, 1932   Cancelled Treatment:    Reason Eval/Treat Not Completed: Other (comment) Pt restless, confused, hallucinating, a little agitated this morning per RN.  Pt given Ativan.  Will check back as schedule permits, and pt better able to participate.   Janan Halter Payson 01/02/2023, 10:34 AM Paulino Door, DPT Physical Therapist Acute Rehabilitation Services Office: [phone]

## 2023-01-02 NOTE — Progress Notes (Signed)
Patient sleeping comfortably now, family stayed until around 8pm, will let patient sleep for now as she had a rough day today (having some delirium, being combative, etc), IV fluids infusing well, bed alarm on, room visible from nurses station and door cracked open, will continue to monitor.

## 2023-01-02 NOTE — Progress Notes (Addendum)
PROGRESS NOTE    [name] [name]  ZOX:[id number] DOB: [birth year] DOA: 12/31/2022 PCP: Madelin Headings, MD   Brief Narrative:  [name] [name] is a 89 y.o. female with medical history significant of colon polyps, left breast cancer, interstitial cystitis, urinary incontinence, left ankle fracture, hearing loss, hyperlipidemia, hypertension, osteoarthritis right rotator cuff tear, herpes zoster who presented to the emergency department with right hip pain after mechanical fall. Imaging consistent with R femur intertrochanteric fracture. Orthopedics consulted, hospitalist called for admission.  Assessment & Plan:   Principal Problem:   Closed right hip fracture, initial encounter [facility]) Active Problems:   Hyperlipidemia   Essential hypertension   Constipation   INTERSTITIAL CYSTITIS   Malignant neoplasm of upper-inner quadrant of right breast in female, estrogen receptor positive (HCC)   Hypocalcemia   Prediabetes  Closed right hip fracture, initial encounter (HCC) -Orthopedics following - successful IM nail to the right femur 01/01/2023 -Pain currently well-controlled -Supportive care ongoing -PT OT evaluation postoperatively -tentative plan dispo to SNF -Anesthesia has performed area nerve block - tolerated well   Acute encephalopathy, likely hospital delirium  -Postoperatively patient had worsening mental status and confusion -Patient is high risk for hospital delirium, markedly agitated and somewhat combative this morning -Discontinue benzos, add low-dose Geodon twice daily and at night as needed. -Family at bedside to assist with reorientation but has known history of hospital delirium per their discussion  Leukocytosis -Questionably reactive in the setting of above -low threshold to repeat imaging or obtain cultures -Afebrile, without symptoms/complaints for infection  Essential hypertension Continue amlodipine 5 mg p.o. daily.   Constipation Continue  Senokot  Interstitial cystitis with concurrent incontinence  Urinalysis remarkable for proteinuria and ketonuria   Malignant neoplasm of upper-inner quadrant  of right breast in female, estrogen receptor positive (HCC) Continue letrozole 2.5 mg p.o. daily. Follow-up with oncology as an outpatient.   Hypocalcemia Last vitamin D level was normal. Calcium level stable, likely to improve once p.o. intake is restarted   Prediabetes Carbohydrate modified diet. CBG monitoring with [facility] SS while in the hospital.   Hyperlipidemia Currently not on therapy. Follow-up with primary care provider.   DVT prophylaxis: Perioperatively per orthopedics Code Status: DNR Family Communication: Son at bedside  Status is: Inpatient  Dispo: The patient is from: Home              Anticipated d/c is to: Likely SNF pending PT recs              Anticipated d/c date is: 24-48 hours              Patient currently is medically stable for discharge  Consultants:  Orthopedic surgery  Procedures:  ORIF right IM nail 5/31  Antimicrobials:  Perioperatively  Subjective: No acute issues or events overnight denies nausea vomiting diarrhea constipation headache fevers chills or chest pain.  Objective: Vitals:   01/01/23 1626 01/01/23 2128 01/02/23 0205 01/02/23 0537  BP: (!) 126/50 (!) 101/50 (!) 143/61 126/76  Pulse: 73 66    Resp: 20 17 16 16   Temp: 98.4 F (36.9 C) 98.2 F (36.8 C) 98 F (36.7 C) 98.3 F (36.8 C)  TempSrc: Oral Oral Oral Oral  SpO2: 92% 97% 97% 95%  Weight:      Height:        Intake/Output Summary (Last 24 hours) at 01/02/2023 0716 Last data filed at 01/02/2023 0500 Gross per 24 hour  Intake 1392.57 ml  Output 1300 ml  Net 92.57  ml    Filed Weights   12/31/22 1723 01/01/23 1232  Weight: 54.2 kg 54.4 kg    Examination:  General:  Pleasantly resting in bed, No acute distress. HEENT:  Normocephalic atraumatic.  Sclerae nonicteric, noninjected.  Extraocular movements  intact bilaterally. Neck:  Without mass or deformity.  Trachea is midline. Lungs:  Clear to auscultate bilaterally without rhonchi, wheeze, or rales. Heart:  Regular rate and rhythm.  Without murmurs, rubs, or gallops. Abdomen:  Soft, nontender, nondistended.  Without guarding or rebound. Extremities: Without cyanosis, clubbing, edema, or obvious deformity.  Bandage clean dry intact  Data Reviewed: I have personally reviewed following labs and imaging studies  CBC: Recent Labs  Lab 12/31/22 1143 01/01/23 0401 01/02/23 0339  WBC 7.5 11.1* 15.5*  NEUTROABS 6.3  --   --   HGB 13.3 12.7 11.3*  HCT 41.1 38.2 35.6*  MCV 97.2 96.0 100.8*  PLT 140* 147* 143*    Basic Metabolic Panel: Recent Labs  Lab 12/31/22 1143 01/01/23 0401 01/02/23 0339  NA 139 138 137  K 3.7 4.2 4.2  CL 107 107 105  CO2 22 21* 24  GLUCOSE 170* 177* 150*  BUN 26* 20 24*  CREATININE 0.73 0.69 0.81  CALCIUM 8.4* 8.6* 8.1*  MG 2.0  --   --   PHOS 3.3  --   --     GFR: Estimated Creatinine Clearance: 37.2 mL/min (by C-G formula based on SCr of 0.81 mg/dL). Liver Function Tests: Recent Labs  Lab 12/31/22 1143 01/01/23 0401  AST 24 30  ALT 27 26  ALKPHOS 56 49  BILITOT 0.9 1.4*  PROT 6.8 6.5  ALBUMIN 4.0 3.8    No results for input(s): "LIPASE", "AMYLASE" in the last 168 hours. No results for input(s): "AMMONIA" in the last 168 hours. Coagulation Profile: Recent Labs  Lab 12/31/22 1330  INR 1.1    Cardiac Enzymes: No results for input(s): "CKTOTAL", "CKMB", "CKMBINDEX", "TROPONINI" in the last 168 hours. BNP (last 3 results) No results for input(s): "PROBNP" in the last 8760 hours. HbA1C: No results for input(s): "HGBA1C" in the last 72 hours. CBG: Recent Labs  Lab 12/31/22 2201 01/01/23 0747 01/01/23 1150 01/01/23 1705 01/01/23 2111  GLUCAP 173* 165* 99 171* 210*    Lipid Profile: No results for input(s): "CHOL", "HDL", "LDLCALC", "TRIG", "CHOLHDL", "LDLDIRECT" in the last 72  hours. Thyroid Function Tests: No results for input(s): "TSH", "T4TOTAL", "FREET4", "T3FREE", "THYROIDAB" in the last 72 hours. Anemia Panel: No results for input(s): "VITAMINB12", "FOLATE", "FERRITIN", "TIBC", "IRON", "RETICCTPCT" in the last 72 hours. Sepsis Labs: No results for input(s): "PROCALCITON", "LATICACIDVEN" in the last 168 hours.  Recent Results (from the past 240 hour(s))  Surgical pcr screen     Status: Abnormal   Collection Time: 12/31/22  7:07 PM   Specimen: Nasal Mucosa; Nasal Swab  Result Value Ref Range Status   MRSA, PCR NEGATIVE NEGATIVE Final   Staphylococcus aureus POSITIVE (A) NEGATIVE Final    Comment: (NOTE) The Xpert SA Assay (FDA approved for NASAL specimens in patients 32 years of age and older), is one component of a comprehensive surveillance program. It is not intended to diagnose infection nor to guide or monitor treatment. Performed at [facility], [address] [place], [place] [address]          Radiology Studies: Pelvis Portable  Result Date: 01/01/2023 CLINICAL DATA:  Right hip ORIF EXAM: PORTABLE PELVIS 1-2 VIEWS COMPARISON:  12/31/2022 FINDINGS: Interval ORIF of  a intratrochanteric right hip fracture utilizing a gamma nail device with fracture fragments in near anatomic alignment. Avulsion of the lesser trochanter again identified with progressive distraction of the avulsed fracture fragmentl now approximately 2.2 cm. No other fracture identified. Hip joint spaces are preserved. IMPRESSION: 1. Interval ORIF of a intratrochanteric right hip fracture with fracture fragments in near anatomic alignment. Electronically Signed   By: Helyn Numbers M.D.   On: 01/01/2023 16:10   DG HIP UNILAT WITH PELVIS 2-3 VIEWS RIGHT  Result Date: 01/01/2023 CLINICAL DATA:  Elective surgery. EXAM: DG HIP (WITH OR WITHOUT PELVIS) 2-3V RIGHT COMPARISON:  Preoperative imaging. FINDINGS: Five fluoroscopic spot views of the right hip obtained in  the operating room. Sequential images during femoral intramedullary nail with distal locking and trans trochanteric screw fixation of proximal femur fracture. Fluoroscopy time 54 seconds. Dose 8.92 mGy. IMPRESSION: Intraoperative fluoroscopy during proximal femur fracture fixation. Electronically Signed   By: Narda Rutherford M.D.   On: 01/01/2023 15:00   DG C-Arm 1-60 Min-No Report  Result Date: 01/01/2023 Fluoroscopy was utilized by the requesting physician.  No radiographic interpretation.   DG Pelvis 1-2 Views  Result Date: 12/31/2022 CLINICAL DATA:  Fall. EXAM: PELVIS - 1-2 VIEW COMPARISON:  None Available. FINDINGS: Severely displaced and comminuted fracture is seen involving intertrochanteric region of proximal right femur. No other fracture or dislocation is noted. IMPRESSION: Severely displaced and comminuted intertrochanteric fracture of proximal right femur. Electronically Signed   By: Lupita Raider M.D.   On: 12/31/2022 12:40   DG Femur Min 2 Views Right  Result Date: 12/31/2022 CLINICAL DATA:  Fall. EXAM: RIGHT FEMUR 2 VIEWS COMPARISON:  None Available. FINDINGS: Severely displaced and comminuted intertrochanteric fracture of proximal right femur is noted. Middle and distal portions of right humerus are unremarkable. IMPRESSION: Severely displaced and comminuted intertrochanteric fracture of proximal right femur. Electronically Signed   By: Lupita Raider M.D.   On: 12/31/2022 12:39   DG Chest Portable 1 View  Result Date: 12/31/2022 CLINICAL DATA:  fall EXAM: PORTABLE CHEST 1 VIEW COMPARISON:  CXR 02/16/11 FINDINGS: Left axillary surgical clips. Cardiomegaly. No pleural effusion. No pneumothorax. There are prominent bilateral opacities that could represent pulmonary venous congestion or atypical infection. No focal airspace opacity. No radiographically apparent displaced rib fractures degenerative changes of the bilateral glenohumeral joints. Degenerative changes in the visualized  lumbar spine. IMPRESSION: Cardiomegaly and pulmonary venous congestion. Electronically Signed   By: Lorenza Cambridge M.D.   On: 12/31/2022 12:38        Scheduled Meds:  amLODipine  5 mg Oral Daily   ascorbic acid  500 mg Oral Daily   calcium-vitamin D  2 tablet Oral Daily   docusate sodium  100 mg Oral BID   enoxaparin (LOVENOX) injection  40 mg Subcutaneous Q24H   feeding supplement  296 mL Oral Once   insulin aspart  0-9 Units Subcutaneous TID WC   letrozole  2.5 mg Oral Daily   metoprolol succinate  12.5 mg Oral Daily   multivitamin with minerals  1 tablet Oral Daily   mupirocin ointment   Nasal BID   senna  1 tablet Oral BID   Continuous Infusions:  0.9 % NaCl with KCl 20 mEq / L 50 mL/hr at 01/01/23 1717   methocarbamol (ROBAXIN) IV       LOS: 2 days   Time spent:  Azucena Fallen, DO [facility]  If 7PM-7AM, please contact night-coverage [email or web address]  01/02/2023, 7:16  AM

## 2023-01-02 NOTE — Plan of Care (Signed)
Plan of care reviewed. 

## 2023-01-02 NOTE — Progress Notes (Signed)
Subjective: 1 Day Post-Op Procedure(s) (LRB): INTRAMEDULLARY (IM) NAIL INTERTROCHANTERIC (Right) Patient reports pain as mild.  Reports hallucinations overnight. States she is fearful to get up this AM due to that.  Objective: Vital signs in last 24 hours: Temp:  [97.9 F (36.6 C)-98.4 F (36.9 C)] 98.3 F (36.8 C) (06/01 0537) Pulse Rate:  [66-95] 66 (05/31 2128) Resp:  [11-20] 16 (06/01 0537) BP: (101-143)/(50-96) 126/76 (06/01 0537) SpO2:  [88 %-100 %] 95 % (06/01 0537) Weight:  [54.4 kg] 54.4 kg (05/31 1232)  Intake/Output from previous day: 05/31 0701 - 06/01 0700 In: 1392.6 [P.O.:120; I.V.:872.5; IV Piggyback:400.1] Out: 1300 [Urine:1250; Blood:50] Intake/Output this shift: No intake/output data recorded.  Recent Labs    12/31/22 1143 01/01/23 0401 01/02/23 0339  HGB 13.3 12.7 11.3*   Recent Labs    01/01/23 0401 01/02/23 0339  WBC 11.1* 15.5*  RBC 3.98 3.53*  HCT 38.2 35.6*  PLT 147* 143*   Recent Labs    01/01/23 0401 01/02/23 0339  NA 138 137  K 4.2 4.2  CL 107 105  CO2 21* 24  BUN 20 24*  CREATININE 0.69 0.81  GLUCOSE 177* 150*  CALCIUM 8.6* 8.1*   Recent Labs    12/31/22 1330  INR 1.1    Neurologically intact ABD soft Neurovascular intact Sensation intact distally Intact pulses distally Dorsiflexion/Plantar flexion intact Incision: dressing C/D/I and no drainage No cellulitis present Compartment soft No sign of DVT   Assessment/Plan: 1 Day Post-Op Procedure(s) (LRB): INTRAMEDULLARY (IM) NAIL INTERTROCHANTERIC (Right) Advance diet Up with therapy D/C IV fluids WBAT RLE with walker Limit narcotics (did not receive any overnight to cause hallucinations, possibly due to anesthesia) ASA for DVT ppx  [name] [name] 01/02/2023, 9:21 AM

## 2023-01-02 NOTE — Progress Notes (Signed)
OT Cancellation Note  Patient Details Name: [name] [name] MRN: [id number] DOB: 02/23/1932   Cancelled Treatment:     Pt with increased confusion, slight agitation, and restlessness, requiring Ativan administration. Will follow-up for OT eval at later date, as appropriate.   Reuben Likes, OTR/L 01/02/2023, 4:34 PM

## 2023-01-03 ENCOUNTER — Encounter (HOSPITAL_COMMUNITY): Payer: Self-pay | Admitting: Orthopedic Surgery

## 2023-01-03 DIAGNOSIS — S72001A Fracture of unspecified part of neck of right femur, initial encounter for closed fracture: Secondary | ICD-10-CM | POA: Diagnosis not present

## 2023-01-03 LAB — BASIC METABOLIC PANEL
Anion gap: 6 (ref 5–15)
BUN: 15 mg/dL (ref 8–23)
CO2: 25 mmol/L (ref 22–32)
Calcium: 7.8 mg/dL — ABNORMAL LOW (ref 8.9–10.3)
Chloride: 107 mmol/L (ref 98–111)
Creatinine, Ser: 0.72 mg/dL (ref 0.44–1.00)
GFR, Estimated: >60 mL/min (ref >60)
Glucose, Bld: 100 mg/dL — ABNORMAL HIGH (ref 70–99)
Potassium: 4.1 mmol/L (ref 3.5–5.1)
Sodium: 138 mmol/L (ref 135–145)

## 2023-01-03 LAB — CBC
HCT: 34.6 % — ABNORMAL LOW (ref 36.0–46.0)
Hemoglobin: 11.2 g/dL — ABNORMAL LOW (ref 12.0–15.0)
MCH: 31.5 pg (ref 26.0–34.0)
MCHC: 32.4 g/dL (ref 30.0–36.0)
MCV: 97.5 fL (ref 80.0–100.0)
Platelets: 133 10*3/uL — ABNORMAL LOW (ref 150–400)
RBC: 3.55 MIL/uL — ABNORMAL LOW (ref 3.87–5.11)
RDW: 12.8 % (ref 11.5–15.5)
WBC: 8.8 10*3/uL (ref 4.0–10.5)
nRBC: 0 % (ref 0.0–0.2)

## 2023-01-03 LAB — GLUCOSE, CAPILLARY
Glucose-Capillary: 116 mg/dL — ABNORMAL HIGH (ref 70–99)
Glucose-Capillary: 123 mg/dL — ABNORMAL HIGH (ref 70–99)
Glucose-Capillary: 127 mg/dL — ABNORMAL HIGH (ref 70–99)
Glucose-Capillary: 135 mg/dL — ABNORMAL HIGH (ref 70–99)
Glucose-Capillary: 96 mg/dL (ref 70–99)

## 2023-01-03 NOTE — Progress Notes (Signed)
PROGRESS NOTE    [name] [name]  BJY:[id number] DOB: [birth year] DOA: 12/31/2022 PCP: Madelin Headings, MD   Brief Narrative:  [name] [name] is a 89 y.o. female with medical history significant of colon polyps, left breast cancer, interstitial cystitis, urinary incontinence, left ankle fracture, hearing loss, hyperlipidemia, hypertension, osteoarthritis right rotator cuff tear, herpes zoster who presented to the emergency department with right hip pain after mechanical fall. Imaging consistent with R femur intertrochanteric fracture. Orthopedics consulted, hospitalist called for admission.  Assessment & Plan:   Principal Problem:   Closed right hip fracture, initial encounter [facility]) Active Problems:   Hyperlipidemia   Essential hypertension   Constipation   INTERSTITIAL CYSTITIS   Malignant neoplasm of upper-inner quadrant of right breast in female, estrogen receptor positive (HCC)   Hypocalcemia   Prediabetes   Malnutrition of moderate degree  Closed right hip fracture, initial encounter (HCC) -Orthopedics following - successful IM nail to the right femur 01/01/2023 -Pain currently well-controlled -Supportive care ongoing -PT OT evaluation postoperatively -tentative plan dispo to SNF -Anesthesia has performed area nerve block - tolerated well   Acute encephalopathy, likely hospital delirium,resolved -Postoperatively patient had worsening mental status and confusion with hallucinations -Now resolved - back to baseline(likely undiagnosed dementia underlying) -Patient is high risk for hospital delirium, markedly agitated and somewhat combative this morning -Discontinue benzos, add low-dose Geodon twice daily and at night as needed. -Family at bedside to assist with reorientation but has known history of hospital delirium per their discussion  Leukocytosis -Questionably reactive in the setting of above -low threshold to repeat imaging or obtain cultures -Afebrile, without  symptoms/complaints for infection  Essential hypertension Continue amlodipine 5 mg p.o. daily.   Constipation Continue Senokot  Interstitial cystitis with concurrent incontinence  Urinalysis remarkable for proteinuria and ketonuria   Malignant neoplasm of upper-inner quadrant  of right breast in female, estrogen receptor positive (HCC) Continue letrozole 2.5 mg p.o. daily. Follow-up with oncology as an outpatient.   Hypocalcemia Last vitamin D level was normal. Calcium level stable, likely to improve once p.o. intake is restarted   Prediabetes Carbohydrate modified diet. CBG monitoring with RI SS while in the hospital.   Hyperlipidemia Currently not on therapy. Follow-up with primary care provider.   DVT prophylaxis: Perioperatively per orthopedics Code Status: DNR Family Communication: Son at bedside  Status is: Inpatient  Dispo: The patient is from: Home              Anticipated d/c is to: Likely SNF pending PT recs              Anticipated d/c date is: 24-48 hours              Patient currently is medically stable for discharge  Consultants:  Orthopedic surgery  Procedures:  ORIF right IM nail 5/31  Antimicrobials:  Perioperatively  Subjective: No acute issues or events overnight denies nausea vomiting diarrhea constipation headache fevers chills or chest pain.  Objective: Vitals:   01/02/23 1816 01/03/23 0010 01/03/23 0657 01/03/23 0659  BP: (!) 142/68 (!) 143/78  (!) 149/65  Pulse: 64 77 66 75  Resp: 18 18  16   Temp: 98 F (36.7 C) 98.3 F (36.8 C)  98 F (36.7 C)  TempSrc: Oral Oral    SpO2: 92% 92% 92% 94%  Weight:      Height:        Intake/Output Summary (Last 24 hours) at 01/03/2023 0726 Last data filed at 01/03/2023 0200 Gross per  24 hour  Intake 699.96 ml  Output 300 ml  Net 399.96 ml    Filed Weights   12/31/22 1723 01/01/23 1232  Weight: 54.2 kg 54.4 kg    Examination:  General:  Pleasantly resting in bed, No acute  distress. HEENT:  Normocephalic atraumatic.  Sclerae nonicteric, noninjected.  Extraocular movements intact bilaterally. Neck:  Without mass or deformity.  Trachea is midline. Lungs:  Clear to auscultate bilaterally without rhonchi, wheeze, or rales. Heart:  Regular rate and rhythm.  Without murmurs, rubs, or gallops. Abdomen:  Soft, nontender, nondistended.  Without guarding or rebound. Extremities: Without cyanosis, clubbing, edema, or obvious deformity.  Bandage clean dry intact  Data Reviewed: I have personally reviewed following labs and imaging studies  CBC: Recent Labs  Lab 12/31/22 1143 01/01/23 0401 01/02/23 0339  WBC 7.5 11.1* 15.5*  NEUTROABS 6.3  --   --   HGB 13.3 12.7 11.3*  HCT 41.1 38.2 35.6*  MCV 97.2 96.0 100.8*  PLT 140* 147* 143*    Basic Metabolic Panel: Recent Labs  Lab 12/31/22 1143 01/01/23 0401 01/02/23 0339  NA 139 138 137  K 3.7 4.2 4.2  CL 107 107 105  CO2 22 21* 24  GLUCOSE 170* 177* 150*  BUN 26* 20 24*  CREATININE 0.73 0.69 0.81  CALCIUM 8.4* 8.6* 8.1*  MG 2.0  --   --   PHOS 3.3  --   --     GFR: Estimated Creatinine Clearance: 37.2 mL/min (by C-G formula based on SCr of 0.81 mg/dL). Liver Function Tests: Recent Labs  Lab 12/31/22 1143 01/01/23 0401  AST 24 30  ALT 27 26  ALKPHOS 56 49  BILITOT 0.9 1.4*  PROT 6.8 6.5  ALBUMIN 4.0 3.8    Coagulation Profile: Recent Labs  Lab 12/31/22 1330  INR 1.1    CBG: Recent Labs  Lab 01/01/23 2111 01/02/23 0729 01/02/23 1337 01/02/23 1623 01/03/23 0013  GLUCAP 210* 141* 115* 109* 123*    Recent Results (from the past 240 hour(s))  Surgical pcr screen     Status: Abnormal   Collection Time: 12/31/22  7:07 PM   Specimen: Nasal Mucosa; Nasal Swab  Result Value Ref Range Status   MRSA, PCR NEGATIVE NEGATIVE Final   Staphylococcus aureus POSITIVE (A) NEGATIVE Final    Comment: (NOTE) The Xpert SA Assay (FDA approved for NASAL specimens in patients 39 years of age and  older), is one component of a comprehensive surveillance program. It is not intended to diagnose infection nor to guide or monitor treatment. Performed at [facility], [address] [place], [place] [address]          Radiology Studies: ECHOCARDIOGRAM COMPLETE  Result Date: 01/02/2023    ECHOCARDIOGRAM REPORT   Patient Name:   [name] [name] Date of Exam: 01/02/2023 Medical Rec #:  [id number]        Height:       62.0 in Accession #:    [id number]       Weight:       119.9 lb Date of Birth:  [birth year]        BSA:          1.538 m Patient Age:    89 years         BP:           141/77 mmHg Patient Gender: F                HR:  50 bpm. Exam Location:  Inpatient Procedure: 2D Echo, Cardiac Doppler and Color Doppler Indications:    Abnormal ECG R94.31                 Cardiomegaly I51.7  History:        Patient has no prior history of Echocardiogram examinations.                 Risk Factors:Hypertension and Dyslipidemia.  Sonographer:    Dondra Prader RVT RCS Referring Phys: [id number] DAVID MANUEL ORTIZ  Sonographer Comments: Image acquisition challenging due to respiratory motion. IMPRESSIONS  1. Left ventricular ejection fraction, by estimation, is 60 to 65%. The left ventricle has normal function. The left ventricle has no regional wall motion abnormalities. Left ventricular diastolic parameters are consistent with Grade I diastolic dysfunction (impaired relaxation).  2. Right ventricular systolic function is normal. The right ventricular size is normal. There is normal pulmonary artery systolic pressure. The estimated right ventricular systolic pressure is 11.9 mmHg.  3. Left atrial size was severely dilated.  4. Right atrial size was severely dilated.  5. The mitral valve is normal in structure. Mild mitral valve regurgitation. No evidence of mitral stenosis.  6. The aortic valve is tricuspid. Aortic valve regurgitation is not visualized. No aortic stenosis is present.  7.  The inferior vena cava is normal in size with greater than 50% respiratory variability, suggesting right atrial pressure of 3 mmHg. FINDINGS  Left Ventricle: Left ventricular ejection fraction, by estimation, is 60 to 65%. The left ventricle has normal function. The left ventricle has no regional wall motion abnormalities. The left ventricular internal cavity size was normal in size. There is  no left ventricular hypertrophy. Left ventricular diastolic parameters are consistent with Grade I diastolic dysfunction (impaired relaxation). Right Ventricle: The right ventricular size is normal. No increase in right ventricular wall thickness. Right ventricular systolic function is normal. There is normal pulmonary artery systolic pressure. The tricuspid regurgitant velocity is 1.49 m/s, and  with an assumed right atrial pressure of 3 mmHg, the estimated right ventricular systolic pressure is 11.9 mmHg. Left Atrium: Left atrial size was severely dilated. Right Atrium: Right atrial size was severely dilated. Pericardium: There is no evidence of pericardial effusion. Mitral Valve: The mitral valve is normal in structure. Mild mitral valve regurgitation. No evidence of mitral valve stenosis. Tricuspid Valve: The tricuspid valve is normal in structure. Tricuspid valve regurgitation is mild . No evidence of tricuspid stenosis. Aortic Valve: The aortic valve is tricuspid. Aortic valve regurgitation is not visualized. No aortic stenosis is present. Aortic valve mean gradient measures 4.5 mmHg. Aortic valve peak gradient measures 8.7 mmHg. Aortic valve area, by VTI measures 1.86 cm. Pulmonic Valve: The pulmonic valve was normal in structure. Pulmonic valve regurgitation is not visualized. No evidence of pulmonic stenosis. Aorta: The aortic root is normal in size and structure. Venous: The inferior vena cava is normal in size with greater than 50% respiratory variability, suggesting right atrial pressure of 3 mmHg. IAS/Shunts: No  atrial level shunt detected by color flow Doppler.  LEFT VENTRICLE PLAX 2D LVIDd:         4.40 cm   Diastology LVIDs:         3.10 cm   LV e' medial:  10.30 cm/s LV PW:         0.80 cm   LV e' lateral: 11.40 cm/s LV IVS:        1.00 cm LVOT diam:  1.70 cm LV SV:         58 LV SV Index:   38 LVOT Area:     2.27 cm  RIGHT VENTRICLE          IVC RV Basal diam:  3.90 cm  IVC diam: 1.90 cm LEFT ATRIUM              Index        RIGHT ATRIUM           Index LA diam:        2.60 cm  1.69 cm/m   RA Area:     21.50 cm LA Vol (A2C):   107.0 ml 69.56 ml/m  RA Volume:   68.20 ml  44.34 ml/m LA Vol (A4C):   108.0 ml 70.21 ml/m LA Biplane Vol: 110.0 ml 71.51 ml/m  AORTIC VALVE                     PULMONIC VALVE AV Area (Vmax):    1.95 cm      PV Vmax:       0.94 m/s AV Area (Vmean):   1.92 cm      PV Peak grad:  3.5 mmHg AV Area (VTI):     1.86 cm AV Vmax:           147.50 cm/s AV Vmean:          101.150 cm/s AV VTI:            0.311 m AV Peak Grad:      8.7 mmHg AV Mean Grad:      4.5 mmHg LVOT Vmax:         127.00 cm/s LVOT Vmean:        85.750 cm/s LVOT VTI:          0.255 m LVOT/AV VTI ratio: 0.82  AORTA Ao Root diam: 2.70 cm Ao Asc diam:  3.50 cm TRICUSPID VALVE TR Peak grad:   8.9 mmHg TR Vmax:        149.00 cm/s  SHUNTS Systemic VTI:  0.26 m Systemic Diam: 1.70 cm Donato Schultz MD Electronically signed by Donato Schultz MD Signature Date/Time: 01/02/2023/12:53:58 PM    Final    Pelvis Portable  Result Date: 01/01/2023 CLINICAL DATA:  Right hip ORIF EXAM: PORTABLE PELVIS 1-2 VIEWS COMPARISON:  12/31/2022 FINDINGS: Interval ORIF of a intratrochanteric right hip fracture utilizing a gamma nail device with fracture fragments in near anatomic alignment. Avulsion of the lesser trochanter again identified with progressive distraction of the avulsed fracture fragmentl now approximately 2.2 cm. No other fracture identified. Hip joint spaces are preserved. IMPRESSION: 1. Interval ORIF of a intratrochanteric right hip  fracture with fracture fragments in near anatomic alignment. Electronically Signed   By: Helyn Numbers M.D.   On: 01/01/2023 16:10   DG HIP UNILAT WITH PELVIS 2-3 VIEWS RIGHT  Result Date: 01/01/2023 CLINICAL DATA:  Elective surgery. EXAM: DG HIP (WITH OR WITHOUT PELVIS) 2-3V RIGHT COMPARISON:  Preoperative imaging. FINDINGS: Five fluoroscopic spot views of the right hip obtained in the operating room. Sequential images during femoral intramedullary nail with distal locking and trans trochanteric screw fixation of proximal femur fracture. Fluoroscopy time 54 seconds. Dose 8.92 mGy. IMPRESSION: Intraoperative fluoroscopy during proximal femur fracture fixation. Electronically Signed   By: Narda Rutherford M.D.   On: 01/01/2023 15:00   DG C-Arm 1-60 Min-No Report  Result Date: 01/01/2023 Fluoroscopy was utilized by the requesting physician.  No radiographic interpretation.        Scheduled Meds:  amLODipine  5 mg Oral Daily   ascorbic acid  500 mg Oral Daily   calcium-vitamin D  2 tablet Oral Daily   docusate sodium  100 mg Oral BID   enoxaparin (LOVENOX) injection  40 mg Subcutaneous Q24H   feeding supplement  296 mL Oral Once   insulin aspart  0-9 Units Subcutaneous TID WC   letrozole  2.5 mg Oral Daily   metoprolol succinate  12.5 mg Oral Daily   multivitamin with minerals  1 tablet Oral Daily   mupirocin ointment   Nasal BID   senna  1 tablet Oral BID   Continuous Infusions:  0.9 % NaCl with KCl 20 mEq / L 50 mL/hr at 01/02/23 1319   methocarbamol (ROBAXIN) IV       LOS: 3 days   Time spent:  Azucena Fallen, DO [facility]  If 7PM-7AM, please contact night-coverage [email or web address]  01/03/2023, 7:26 AM

## 2023-01-03 NOTE — Evaluation (Signed)
Physical Therapy Evaluation Patient Details Name: [name] [name] MRN: [id number] DOB: 1932/05/14 Today's Date: 01/03/2023  History of Present Illness  Pt is a 89 year old woman who fell attempting to retrieve an item from her recycling bin resulting in R hip fx. Underwent IM nail. Post op course complicated by delirium. PMH: L wrist fx, HTN, HLD, interstitial cystitis, breast ca, urinary incontinence, HOH, L ankle fx, R shoulder OA.  Clinical Impression  Pt admitted with above diagnosis.  Pt currently with functional limitations due to the deficits listed below (see PT Problem List). Pt will benefit from acute skilled PT to increase their independence and safety with mobility to allow discharge.  Pt admitted after fall at home and typically very independent at baseline. Pt lives with her husband who is not physically in good shape per family reports so pt does household chores and IADLs like mowing the lawn.  Pt requiring increased assist for bed mobility and transfer to recliner today.  Pt's cognition does not appear at baseline (but much better compared to yesterday per son), and pt required multimodal cues for technique and safety. Patient will benefit from intensive inpatient follow up therapy, >3 hours/day.         Recommendations for follow up therapy are one component of a multi-disciplinary discharge planning process, led by the attending physician.  Recommendations may be updated based on patient status, additional functional criteria and insurance authorization.  Follow Up Recommendations       Assistance Recommended at Discharge PRN  Patient can return home with the following  A lot of help with walking and/or transfers;A lot of help with bathing/dressing/bathroom;Assistance with cooking/housework;Assist for transportation;Help with stairs or ramp for entrance    Equipment Recommendations Rolling walker (2 wheels)  Recommendations for Other Services       Functional Status  Assessment Patient has had a recent decline in their functional status and demonstrates the ability to make significant improvements in function in a reasonable and predictable amount of time.     Precautions / Restrictions Precautions Precautions: Fall Restrictions Weight Bearing Restrictions: No RLE Weight Bearing: Weight bearing as tolerated      Mobility  Bed Mobility Overal bed mobility: Needs Assistance Bed Mobility: Supine to Sit     Supine to sit: +2 for physical assistance, Total assist, HOB elevated     General bed mobility comments: assist for all aspects with HOB up using bed pad to pivot hips to EOB, support also provided with Rt LE due to pain (limited medication due to cognition)    Transfers Overall transfer level: Needs assistance Equipment used: Rolling walker (2 wheels) Transfers: Sit to/from Stand, Bed to chair/wheelchair/BSC Sit to Stand: +2 physical assistance, Mod assist   Step pivot transfers: +2 physical assistance, Min assist       General transfer comment: cues for technique/safety, steadying assist, unable to step backward, mod assist to control descent to chair    Ambulation/Gait                  Stairs            Wheelchair Mobility    Modified Rankin (Stroke Patients Only)       Balance Overall balance assessment: Needs assistance   Sitting balance-Leahy Scale: Fair     Standing balance support: Bilateral upper extremity supported, Reliant on assistive device for balance Standing balance-Leahy Scale: Poor  Pertinent Vitals/Pain Pain Assessment Pain Assessment: Faces Faces Pain Scale: Hurts little more Pain Location: R hip Pain Descriptors / Indicators: Grimacing, Guarding, Discomfort Pain Intervention(s): Monitored during session, Repositioned    Home Living Family/patient expects to be discharged to:: Private residence Living Arrangements: Spouse/significant  other Available Help at Discharge: Family;Available 24 hours/day (husband cannot physically assist) Type of Home: House Home Access: Stairs to enter Entrance Stairs-Rails: Right Entrance Stairs-Number of Steps: 2   Home Layout: One level Home Equipment: None      Prior Function Prior Level of Function : Independent/Modified Independent;Driving                     Hand Dominance   Dominant Hand: Right    Extremity/Trunk Assessment   Upper Extremity Assessment Upper Extremity Assessment: Overall WFL for tasks assessed    Lower Extremity Assessment Lower Extremity Assessment: Generalized weakness;RLE deficits/detail RLE Deficits / Details: requiring assist RLE: Unable to fully assess due to pain    Cervical / Trunk Assessment Cervical / Trunk Assessment: Kyphotic  Communication   Communication: HOH  Cognition Arousal/Alertness: Awake/alert Behavior During Therapy: Anxious Overall Cognitive Status: Impaired/Different from baseline Area of Impairment: Following commands, Safety/judgement, Problem solving                       Following Commands: Follows one step commands with increased time (and multimodal cues) Safety/Judgement: Decreased awareness of safety, Decreased awareness of deficits   Problem Solving: Slow processing, Decreased initiation, Difficulty sequencing, Requires verbal cues, Requires tactile cues General Comments: pleasant, motivated but apprehensive; cognition improved from yesterday however does not appear at baseline        General Comments      Exercises     Assessment/Plan    PT Assessment Patient needs continued PT services  PT Problem List Decreased strength;Decreased activity tolerance;Decreased balance;Decreased mobility;Decreased knowledge of use of DME;Decreased safety awareness;Decreased cognition;Pain       PT Treatment Interventions DME instruction;Gait training;Balance training;Therapeutic exercise;Functional  mobility training;Patient/family education;Therapeutic activities    PT Goals (Current goals can be found in the Care Plan section)  Acute Rehab PT Goals PT Goal Formulation: With patient/family Time For Goal Achievement: 01/17/23 Potential to Achieve Goals: Good    Frequency Min 1X/week     Co-evaluation PT/OT/SLP Co-Evaluation/Treatment: Yes Reason for Co-Treatment: For patient/therapist safety PT goals addressed during session: Mobility/safety with mobility OT goals addressed during session: ADL's and self-care       AM-PAC PT "6 Clicks" Mobility  Outcome Measure Help needed turning from your back to your side while in a flat bed without using bedrails?: Total Help needed moving from lying on your back to sitting on the side of a flat bed without using bedrails?: Total Help needed moving to and from a bed to a chair (including a wheelchair)?: A Lot Help needed standing up from a chair using your arms (e.g., wheelchair or bedside chair)?: A Lot Help needed to walk in hospital room?: Total Help needed climbing 3-5 steps with a railing? : Total 6 Click Score: 8    End of Session Equipment Utilized During Treatment: Gait belt Activity Tolerance: Patient tolerated treatment well Patient left: in chair;with call bell/phone within reach;with chair alarm set;with family/visitor present Nurse Communication: Mobility status PT Visit Diagnosis: Difficulty in walking, not elsewhere classified (R26.2);Unsteadiness on feet (R26.81)    Time: 1610-9604 PT Time Calculation (min) (ACUTE ONLY): 28 min   Charges:   PT Evaluation $PT  Eval Low Complexity: 1 Low         Kati PT, DPT Physical Therapist Acute Rehabilitation Services Office: [phone]   Janan Halter [place] 01/03/2023, 1:43 PM

## 2023-01-03 NOTE — Progress Notes (Signed)
Inpatient Rehab Admissions Coordinator Note:   Per therapy patient was screened for CIR candidacy by Tashawnda Bleiler Luvenia Starch, CCC-SLP. At this time, pt appears to be a potential candidate for CIR. I will place an order for rehab consult for full assessment, per our protocol.  Please contact me any with questions.[name] Phoenix, MS, CCC-SLP Admissions Coordinator [phone] 01/03/23 5:50 PM

## 2023-01-03 NOTE — Evaluation (Signed)
Occupational Therapy Evaluation Patient Details Name: [name] [name] MRN: [id number] DOB: October 09, 1931 Today's Date: 01/03/2023   History of Present Illness Pt is a 89 year old woman who fell attempting to retrieve an item from her recycling bin resulting in R hip fx. Underwent IM nail. Post op course complicated by delirium. PMH: L wrist fx, HTN, HLD, interstitial cystitis, breast ca, urinary incontinence, HOH, L ankle fx, R shoulder OA.   Clinical Impression   Pt is typically independent in ADLs and IADLs. She drives and mows the lawn. Pt lives with her husband. Presents with pain in R hip, generalized weakness, decreased standing balance and impaired cognition (although improved from yesterday per son). Pt requires +2 total assist for bed mobility with difficulty sequencing, +2 min for transfer with RW. She needs set up to total assist for ADLs. Family agrees with post hospitalization rehab. Pt has excellent potential to return to modified independence with intensive therapy. Patient will benefit from intensive inpatient follow up therapy, >3 hours/day.      Recommendations for follow up therapy are one component of a multi-disciplinary discharge planning process, led by the attending physician.  Recommendations may be updated based on patient status, additional functional criteria and insurance authorization.   Assistance Recommended at Discharge Frequent or constant Supervision/Assistance  Patient can return home with the following Two people to help with walking and/or transfers;A lot of help with bathing/dressing/bathroom;Assistance with cooking/housework;Direct supervision/assist for medications management;Direct supervision/assist for financial management;Assist for transportation;Help with stairs or ramp for entrance    Functional Status Assessment  Patient has had a recent decline in their functional status and demonstrates the ability to make significant improvements in function in a  reasonable and predictable amount of time.  Equipment Recommendations  BSC/3in1    Recommendations for Other Services Rehab consult     Precautions / Restrictions Precautions Precautions: Fall Restrictions Weight Bearing Restrictions: Yes RLE Weight Bearing: Weight bearing as tolerated      Mobility Bed Mobility Overal bed mobility: Needs Assistance Bed Mobility: Supine to Sit     Supine to sit: +2 for physical assistance, Total assist, HOB elevated     General bed mobility comments: assist for all aspects with HOB up using bed pad to pivot hips to EOB    Transfers Overall transfer level: Needs assistance Equipment used: Rolling walker (2 wheels) Transfers: Sit to/from Stand, Bed to chair/wheelchair/BSC Sit to Stand: +2 physical assistance, Min assist     Step pivot transfers: +2 physical assistance, Min assist     General transfer comment: cues for technique/safety, steadying assist, unable to step backward, assist to control descent to chair      Balance Overall balance assessment: Needs assistance   Sitting balance-Leahy Scale: Fair     Standing balance support: Bilateral upper extremity supported Standing balance-Leahy Scale: Poor                             ADL either performed or assessed with clinical judgement   ADL Overall ADL's : Needs assistance/impaired Eating/Feeding: Independent;Sitting   Grooming: Set up;Sitting   Upper Body Bathing: Minimal assistance;Sitting   Lower Body Bathing: Total assistance;Sit to/from stand   Upper Body Dressing : Minimal assistance;Sitting   Lower Body Dressing: Total assistance;Sit to/from stand   Toilet Transfer: Stand-pivot;Rolling walker (2 wheels);Minimal assistance;+2 for physical assistance   Toileting- Clothing Manipulation and Hygiene: Total assistance;Sit to/from stand  Vision Baseline Vision/History: 1 Wears glasses Ability to See in Adequate Light: 0  Adequate Patient Visual Report: No change from baseline       Perception     Praxis      Pertinent Vitals/Pain Pain Assessment Pain Assessment: Faces Faces Pain Scale: Hurts little more Pain Location: R hip Pain Descriptors / Indicators: Grimacing, Guarding, Discomfort Pain Intervention(s): Repositioned, Monitored during session, Ice applied     Hand Dominance Right   Extremity/Trunk Assessment Upper Extremity Assessment Upper Extremity Assessment: Overall WFL for tasks assessed   Lower Extremity Assessment Lower Extremity Assessment: Defer to PT evaluation   Cervical / Trunk Assessment Cervical / Trunk Assessment: Kyphotic   Communication Communication Communication: HOH   Cognition Arousal/Alertness: Awake/alert Behavior During Therapy: Anxious Overall Cognitive Status: Impaired/Different from baseline Area of Impairment: Following commands, Safety/judgement, Problem solving                       Following Commands: Follows one step commands with increased time (and multimodal cues) Safety/Judgement: Decreased awareness of safety, Decreased awareness of deficits   Problem Solving: Slow processing, Decreased initiation, Difficulty sequencing, Requires verbal cues, Requires tactile cues       General Comments       Exercises     Shoulder Instructions      Home Living Family/patient expects to be discharged to:: Private residence Living Arrangements: Spouse/significant other Available Help at Discharge: Family;Available 24 hours/day (husband cannot physically assist) Type of Home: House Home Access: Stairs to enter [employer] of Steps: 2 Entrance Stairs-Rails: Right Home Layout: One level     Bathroom Shower/Tub: Chief Strategy Officer: Handicapped height     Home Equipment: None          Prior Functioning/Environment Prior Level of Function : Independent/Modified Independent;Driving                         OT Problem List: Decreased strength;Decreased activity tolerance;Impaired balance (sitting and/or standing);Decreased cognition;Decreased safety awareness;Decreased knowledge of use of DME or AE;Pain      OT Treatment/Interventions: Self-care/ADL training;DME and/or AE instruction;Therapeutic activities;Patient/family education;Balance training;Cognitive remediation/compensation    OT Goals(Current goals can be found in the care plan section) Acute Rehab OT Goals OT Goal Formulation: With patient/family Time For Goal Achievement: 01/17/23 Potential to Achieve Goals: Good ADL Goals Pt Will Perform Grooming: standing;with supervision Pt Will Perform Lower Body Bathing: with supervision;with adaptive equipment;sit to/from stand Pt Will Perform Lower Body Dressing: with supervision;with adaptive equipment;sit to/from stand Pt Will Transfer to Toilet: with min guard assist;ambulating;bedside commode Pt Will Perform Toileting - Clothing Manipulation and hygiene: with min guard assist;sit to/from stand Additional ADL Goal #1: Pt will perform bed mobility with supervision in preparation for ADLs. Additional ADL Goal #2: Pt will follow 2 step commands with 75% accuracy.  OT Frequency: Min 2X/week    Co-evaluation PT/OT/SLP Co-Evaluation/Treatment: Yes Reason for Co-Treatment: For patient/therapist safety   OT goals addressed during session: ADL's and self-care      AM-PAC OT "6 Clicks" Daily Activity     Outcome Measure Help from another person eating meals?: None Help from another person taking care of personal grooming?: A Little Help from another person toileting, which includes using toliet, bedpan, or urinal?: Total Help from another person bathing (including washing, rinsing, drying)?: Total Help from another person to put on and taking off regular upper body clothing?: A Little Help from another person  to put on and taking off regular lower body clothing?: Total 6 Click  Score: 13   End of Session Equipment Utilized During Treatment: Rolling walker (2 wheels);Gait belt Nurse Communication: Mobility status;Other (comment) (purewick out)  Activity Tolerance: Patient tolerated treatment well Patient left: in chair;with call bell/phone within reach;with chair alarm set;with family/visitor present  OT Visit Diagnosis: Unsteadiness on feet (R26.81);Other abnormalities of gait and mobility (R26.89);Pain;Muscle weakness (generalized) (M62.81);History of falling (Z91.81);Other symptoms and signs involving cognitive function Pain - Right/Left: Right Pain - part of body: Hip                Time: 1208-1232 OT Time Calculation (min): 24 min Charges:  OT General Charges $OT Visit: 1 Visit OT Evaluation $OT Eval Moderate Complexity: 1 Mod  Berna Spare, OTR/L Acute Rehabilitation Services Office: [phone]   Evern Bio 01/03/2023, 12:55 PM

## 2023-01-03 NOTE — Plan of Care (Signed)
  Problem: Pain Management: Goal: Pain level will decrease Outcome: Progressing   Problem: Coping: Goal: Level of anxiety will decrease Outcome: Progressing   Problem: Safety: Goal: Ability to remain free from injury will improve Outcome: Progressing   

## 2023-01-03 NOTE — TOC CM/SW Note (Signed)
This CSW reached out to the patient's son Mardelle Matte to speak about PT REC. AT this time there was no answer CSW has left V/M to inform the son that [facility] will continue to reach out. TOC will continue to follow patient.

## 2023-01-04 DIAGNOSIS — S72001A Fracture of unspecified part of neck of right femur, initial encounter for closed fracture: Secondary | ICD-10-CM | POA: Diagnosis not present

## 2023-01-04 LAB — GLUCOSE, CAPILLARY
Glucose-Capillary: 113 mg/dL — ABNORMAL HIGH (ref 70–99)
Glucose-Capillary: 159 mg/dL — ABNORMAL HIGH (ref 70–99)
Glucose-Capillary: 121 mg/dL — ABNORMAL HIGH (ref 70–99)
Glucose-Capillary: 185 mg/dL — ABNORMAL HIGH (ref 70–99)

## 2023-01-04 MED ORDER — POLYETHYLENE GLYCOL 3350 17 G PO PACK
17.0000 g | PACK | Freq: Every day | ORAL | Status: DC
  Administered 2023-01-04 – 2023-01-07 (×4): 17 g via ORAL
  Filled 2023-01-04 (×3): qty 1

## 2023-01-04 NOTE — Progress Notes (Signed)
Occupational Therapy Treatment Patient Details Name: [name] [name] MRN: [id number] DOB: 11/28/1931 Today's Date: 01/04/2023   History of present illness Pt is a 89 year old woman who fell attempting to retrieve an item from her recycling bin resulting in R hip fx. Underwent IM nail. Post op course complicated by delirium. PMH: L wrist fx, HTN, HLD, interstitial cystitis, breast ca, urinary incontinence, HOH, L ankle fx, R shoulder OA.   OT comments  Patient made progress towards goals with patients pain and fear of falling again impacting independence in ADLs. Patient was able to engage in bed mobility, transfers and ADLs with less assist compared to evaluation. Patient is very motivated to get better to get back home to husband who she cares for. Patient's discharge plan remains appropriate at this time. OT will continue to follow acutely.     Recommendations for follow up therapy are one component of a multi-disciplinary discharge planning process, led by the attending physician.  Recommendations may be updated based on patient status, additional functional criteria and insurance authorization.    Assistance Recommended at Discharge Frequent or constant Supervision/Assistance  Patient can return home with the following  Two people to help with walking and/or transfers;A lot of help with bathing/dressing/bathroom;Assistance with cooking/housework;Direct supervision/assist for medications management;Direct supervision/assist for financial management;Assist for transportation;Help with stairs or ramp for entrance   Equipment Recommendations  BSC/3in1    Recommendations for Other Services Rehab consult    Precautions / Restrictions Precautions Precautions: Fall Restrictions Weight Bearing Restrictions: No RLE Weight Bearing: Weight bearing as tolerated Other Position/Activity Restrictions: HOH       Mobility Bed Mobility Overal bed mobility: Needs Assistance Bed Mobility: Supine to  Sit     Supine to sit: Mod assist     General bed mobility comments: with patient using gait belt as leg lifter to EOB with increased time and educaiton on how to be more independent. patient had HOB rasied.             ADL either performed or assessed with clinical judgement   ADL Overall ADL's : Needs assistance/impaired     Grooming: Set up;Sitting;Wash/dry face;Oral care;Wash/dry hands Grooming Details (indicate cue type and reason): in recliner Upper Body Bathing: Min guard;Sitting Upper Body Bathing Details (indicate cue type and reason): EOB with increased time             Toilet Transfer: Stand-pivot;Rolling walker (2 wheels);Moderate assistance Toilet Transfer Details (indicate cue type and reason): to recliner with increased A to prevent early sitting and maintain balance with posterior steps with RW Toileting- Clothing Manipulation and Hygiene: Total assistance;Sit to/from stand                Cognition Arousal/Alertness: Awake/alert Behavior During Therapy: Anxious Overall Cognitive Status: Impaired/Different from baseline           General Comments: patient was plesant and very fearful of falling, able to follow directions                   Pertinent Vitals/ Pain       Pain Assessment Pain Assessment: Faces Faces Pain Scale: Hurts little more Pain Location: R hip Pain Descriptors / Indicators: Grimacing, Guarding, Discomfort Pain Intervention(s): Limited activity within patient's tolerance, Monitored during session, Premedicated before session   Frequency  Min 2X/week        Progress Toward Goals  OT Goals(current goals can now be found in the care plan section)  Progress towards OT goals:  Progressing toward goals     Plan Discharge plan remains appropriate       AM-PAC OT "6 Clicks" Daily Activity     Outcome Measure   Help from another person eating meals?: None Help from another person taking care of personal  grooming?: A Little Help from another person toileting, which includes using toliet, bedpan, or urinal?: A Lot Help from another person bathing (including washing, rinsing, drying)?: A Lot Help from another person to put on and taking off regular upper body clothing?: A Little Help from another person to put on and taking off regular lower body clothing?: Total 6 Click Score: 15    End of Session Equipment Utilized During Treatment: Rolling walker (2 wheels);Gait belt  OT Visit Diagnosis: Unsteadiness on feet (R26.81);Other abnormalities of gait and mobility (R26.89);Pain;Muscle weakness (generalized) (M62.81);History of falling (Z91.81);Other symptoms and signs involving cognitive function Pain - Right/Left: Right Pain - part of body: Hip   Activity Tolerance Patient tolerated treatment well   Patient Left in chair;with call bell/phone within reach;with chair alarm set   Nurse Communication Mobility status        Time: 1478-2956 OT Time Calculation (min): 23 min  Charges: OT General Charges $OT Visit: 1 Visit OT Treatments $Self Care/Home Management : 23-37 mins  Rosalio Loud, MS Acute Rehabilitation Department Office# [phone]   Selinda Flavin 01/04/2023, 10:43 AM

## 2023-01-04 NOTE — PMR Pre-admission (Signed)
PMR Admission Coordinator Pre-Admission Assessment  Patient: [name] [name] is an 89 y.o., female MRN: [id number] DOB: [birth year] Height: 5\' 2"  (157.5 cm) Weight: 54.4 kg  Insurance Information HMO: yes    PPO:      PCP:      IPA:      80/20:      OTHER:  PRIMARY: Blue Medicare      Policy#: [id number]      Subscriber: pt CM Name: Shanda Bumps      Phone#: [phone]     Fax#: [phone] Pre-Cert#: [id number] admitted      Employer:  Benefits:  Phone #: [phone]     Name:  Eff. Date: 08/03/22     Deduct: $0      Out of Pocket Max: $3500 (met $132.75)      Life Max: n/a CIR: $335/day for days 1-5      SNF: 20 full days Outpatient:      Co-Pay: $10/visit Home Health: 100%      Co-Pay:  DME: 80%     Co-Pay: 20% Providers:  SECONDARY:       Policy#:      Phone#:   Artist:       Phone#:   The Engineer, materials Information Summary" for patients in Inpatient Rehabilitation Facilities with attached "Privacy Act Statement-Health Care Records" was provided and verbally reviewed with: Patient and Family  Emergency Contact Information Contact Information     Name Relation Home Work Mobile   Drakeford,andy Son   [phone]   Polly, Savard [phone]         Current Medical History  Patient Admitting Diagnosis: hip fracture   History of Present Illness: Pt is an 89 y/o female with PMH of DM, HTN, interstitial cystitis, breast cancer, and several previous fractures admitted to [facility] on 12/31/22 following a fall at home which resulted in a R hip fracture.  ED course, BP 152/89, saturating well on room air, afebrile, labs WNL.  Orthopedics consulted and recommended surgical management of hip fracture.  She underwent IMN of R femur on 01/01/23 per Dr. Linna Caprice.  Post op course encephalopathy related to medication and hospital delirium.  Leukocytosis possibly in the setting of encephalopathy.  Hospital course pain management.  Therapy evaluations were completed and  pt was recommended for CIR.      Patient's medical record from [facility] has been reviewed by the rehabilitation admission coordinator and physician.  Past Medical History  Past Medical History:  Diagnosis Date   Adenomatous colon polyp     4 2008   Breast cancer (HCC) 1978   left breast   Complication of anesthesia    Cystitis, interstitial    FRACTURE, ANKLE, LEFT 12/14/2006   Qualifier: Diagnosis of  By: Lawernce Ion, CMA (AAMA), Bethann Berkshire    FRACTURE, WRIST 12/14/2006   Qualifier: Diagnosis of  By: Lawernce Ion, CMA (AAMA), Bethann Berkshire    Hearing loss    does not use hearing aids   Hyperlipidemia    Hypertension    Osteoarthritis    PONV (postoperative nausea and vomiting)    Episode after Ether   Rotator cuff tear, right    Shingles    Urinary incontinence     Has the patient had major surgery during 100 days prior to admission? Yes  Family History   family history includes Cancer - Other in her daughter; Heart attack in her father; Suicidality in her mother.  Current Medications  Current Facility-Administered Medications:  acetaminophen (TYLENOL) tablet 325-650 mg, 325-650 mg, Oral, Q6H PRN, Samson Frederic, MD   amLODipine (NORVASC) tablet 5 mg, 5 mg, Oral, Daily, Swinteck, Arlys John, MD, 5 mg at 01/05/23 1610   ascorbic acid (VITAMIN C) tablet 500 mg, 500 mg, Oral, Daily, Swinteck, Arlys John, MD, 500 mg at 01/05/23 9604   calcium-vitamin D (OSCAL WITH D) 500-5 MG-MCG per tablet 2 tablet, 2 tablet, Oral, Daily, Swinteck, Arlys John, MD, 2 tablet at 01/05/23 0806   docusate sodium (COLACE) capsule 100 mg, 100 mg, Oral, BID, Swinteck, Arlys John, MD, 100 mg at 01/05/23 0807   enoxaparin (LOVENOX) injection 40 mg, 40 mg, Subcutaneous, Q24H, Swinteck, Arlys John, MD, 40 mg at 01/05/23 5409   feeding supplement (ENSURE PRE-SURGERY) liquid 296 mL, 296 mL, Oral, Once, Swinteck, Arlys John, MD   HYDROcodone-acetaminophen (NORCO) 7.5-325 MG per tablet 1-2 tablet, 1-2 tablet, Oral, Q4H PRN, Samson Frederic,  MD   HYDROcodone-acetaminophen (NORCO/VICODIN) 5-325 MG per tablet 1-2 tablet, 1-2 tablet, Oral, Q4H PRN, Samson Frederic, MD, 1 tablet at 01/05/23 0336   insulin aspart (novoLOG) injection 0-9 Units, 0-9 Units, Subcutaneous, TID WC, Swinteck, Arlys John, MD, 1 Units at 01/05/23 0853   letrozole [facility]) tablet 2.5 mg, 2.5 mg, Oral, Daily, Swinteck, Arlys John, MD, 2.5 mg at 01/05/23 0817   loratadine (CLARITIN) tablet 10 mg, 10 mg, Oral, Daily PRN, Swinteck, Arlys John, MD   menthol-cetylpyridinium (CEPACOL) lozenge 3 mg, 1 lozenge, Oral, PRN **OR** phenol (CHLORASEPTIC) mouth spray 1 spray, 1 spray, Mouth/Throat, PRN, Swinteck, Arlys John, MD   methocarbamol (ROBAXIN) tablet 500 mg, 500 mg, Oral, Q6H PRN **OR** methocarbamol (ROBAXIN) 500 mg in dextrose 5 % 50 mL IVPB, 500 mg, Intravenous, Q6H PRN, Swinteck, Arlys John, MD   metoCLOPramide (REGLAN) tablet 5-10 mg, 5-10 mg, Oral, Q8H PRN **OR** metoCLOPramide (REGLAN) injection 5-10 mg, 5-10 mg, Intravenous, Q8H PRN, Swinteck, Arlys John, MD   metoprolol succinate (TOPROL-XL) 24 hr tablet 12.5 mg, 12.5 mg, Oral, Daily, Swinteck, Arlys John, MD, 12.5 mg at 01/05/23 8119   multivitamin with minerals tablet 1 tablet, 1 tablet, Oral, Daily, Swinteck, Arlys John, MD, 1 tablet at 01/05/23 0806   mupirocin ointment (BACTROBAN) 2 %, , Nasal, BID, Swinteck, Arlys John, MD, Given at 01/05/23 0807   ondansetron (ZOFRAN) tablet 4 mg, 4 mg, Oral, Q6H PRN **OR** ondansetron (ZOFRAN) injection 4 mg, 4 mg, Intravenous, Q6H PRN, Swinteck, Arlys John, MD   polyethylene glycol (MIRALAX / GLYCOLAX) packet 17 g, 17 g, Oral, Daily, Hazeline Junker B, MD, 17 g at 01/05/23 1478   senna (SENOKOT) tablet 8.6 mg, 1 tablet, Oral, BID, Swinteck, Arlys John, MD, 8.6 mg at 01/05/23 2956   ziprasidone (GEODON) capsule 20 mg, 20 mg, Oral, BID BM & HS PRN, Azucena Fallen, MD  Patients Current Diet:  Diet Order             Diet Carb Modified Fluid consistency: Thin; Room service appropriate? Yes  Diet effective now                    Precautions / Restrictions Precautions Precautions: Fall Restrictions Weight Bearing Restrictions: No RLE Weight Bearing: Weight bearing as tolerated Other Position/Activity Restrictions: HOH   Has the patient had 2 or more falls or a fall with injury in the past year? Yes  Prior Activity Level Community (5-7x/wk): indep prior to admit, no DME, driving, doing yard work, Catering manager.  Prior Functional Level Self Care: Did the patient need help bathing, dressing, using the toilet or eating? Independent  Indoor Mobility: Did the patient need assistance with walking from  room to room (with or without device)? Independent  Stairs: Did the patient need assistance with internal or external stairs (with or without device)? Independent  Functional Cognition: Did the patient need help planning regular tasks such as shopping or remembering to take medications? Independent  Patient Information Are you of Hispanic, Latino/a,or Spanish origin?: A. No, not of Hispanic, Latino/a, or Spanish origin What is your race?: A. White Do you need or want an interpreter to communicate with a doctor or health care staff?: 0. No  Patient's Response To:  Health Literacy and Transportation Is the patient able to respond to health literacy and transportation needs?: Yes Health Literacy - How often do you need to have someone help you when you read instructions, pamphlets, or other written material from your doctor or pharmacy?: Never In the past 12 months, has lack of transportation kept you from medical appointments or from getting medications?: No In the past 12 months, has lack of transportation kept you from meetings, work, or from getting things needed for daily living?: No  Journalist, newspaper / Equipment Home Assistive Devices/Equipment: Eyeglasses Home Equipment: None  Prior Device Use: Indicate devices/aids used by the patient prior to current illness, exacerbation or injury?  N/a  Current  Functional Level Cognition  Overall Cognitive Status: Impaired/Different from baseline Orientation Level: Oriented X4 Following Commands: Follows one step commands with increased time (and multimodal cues) Safety/Judgement: Decreased awareness of safety, Decreased awareness of deficits General Comments: patient was plesant and very fearful of falling, able to follow directions    Extremity Assessment (includes Sensation/Coordination)  Upper Extremity Assessment: Overall WFL for tasks assessed  Lower Extremity Assessment: Generalized weakness, RLE deficits/detail RLE Deficits / Details: requiring assist RLE: Unable to fully assess due to pain    ADLs  Overall ADL's : Needs assistance/impaired Eating/Feeding: Independent, Sitting Grooming: Set up, Sitting, Wash/dry face, Oral care, Wash/dry hands Grooming Details (indicate cue type and reason): in recliner Upper Body Bathing: Min guard, Sitting Upper Body Bathing Details (indicate cue type and reason): EOB with increased time Lower Body Bathing: Total assistance, Sit to/from stand Upper Body Dressing : Minimal assistance, Sitting Lower Body Dressing: Total assistance, Sit to/from stand Toilet Transfer: Stand-pivot, Rolling walker (2 wheels), Moderate assistance Toilet Transfer Details (indicate cue type and reason): to recliner with increased A to prevent early sitting and maintain balance with posterior steps with RW Toileting- Clothing Manipulation and Hygiene: Total assistance, Sit to/from stand    Mobility  Overal bed mobility: Needs Assistance Bed Mobility: Supine to Sit Supine to sit: Mod assist General bed mobility comments: with patient using gait belt as leg lifter to EOB with increased time and educaiton on how to be more independent. patient had HOB rasied.    Transfers  Overall transfer level: Needs assistance Equipment used: Rolling walker (2 wheels) Transfers: Sit to/from Stand, Bed to chair/wheelchair/BSC Sit to  Stand: +2 physical assistance, Mod assist Bed to/from chair/wheelchair/BSC transfer type:: Step pivot Step pivot transfers: +2 physical assistance, Min assist General transfer comment: cues for technique/safety, steadying assist, unable to step backward, mod assist to control descent to chair    Ambulation / Gait / Stairs / Wheelchair Mobility       Posture / Balance Balance Overall balance assessment: Needs assistance Sitting balance-Leahy Scale: Fair Standing balance support: Bilateral upper extremity supported, Reliant on assistive device for balance Standing balance-Leahy Scale: Poor    Special needs/care consideration Skin surgical incisions and Diabetic management yes   Previous Home Environment (  from acute therapy documentation) Living Arrangements: Spouse/significant other Available Help at Discharge: Family, Available 24 hours/day (husband cannot physically assist) Type of Home: House Home Layout: One level Home Access: Stairs to enter Entrance Stairs-Rails: Right Entrance Stairs-Number of Steps: 2 Bathroom Shower/Tub: Engineer, manufacturing systems: Handicapped height Home Care Services: No  Discharge Living Setting Plans for Discharge Living Setting: Patient's home, Lives with (comment) (spouse (cannot provide care for her)) Type of Home at Discharge: House Discharge Home Layout: One level Discharge Home Access: Stairs to enter Entrance Stairs-Rails: Can reach both Entrance Stairs-Number of Steps: 2 Discharge Bathroom Shower/Tub: Tub/shower unit Discharge Bathroom Toilet: Handicapped height Discharge Bathroom Accessibility: Yes How Accessible: Accessible via walker Does the patient have any problems obtaining your medications?: No  Social/Family/Support Systems Patient Roles: Spouse, Caregiver Contact Information: 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 Anticipated Caregiver: combination of hired caregivers and family (son, Mardelle Matte, is primary  contact) Anticipated Caregiver's Contact Information: Mardelle Matte [phone] Ability/Limitations of Caregiver: none stated Caregiver Availability: 24/7 Discharge Plan Discussed with Primary Caregiver: Yes Is Caregiver In Agreement with Plan?: Yes Does Caregiver/Family have Issues with Lodging/Transportation while Pt is in Rehab?: No  Goals Patient/Family Goal for Rehab: PT supervision for household mobility; OT up to min assist for ADLs; SLP n/a Expected length of stay: 18-21 days Additional Information: 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  Pt's son is primary contact for updates/caregiver coordination.  They will need to likely hire caregivers to fill in the gaps for 24/7 coverage. Pt/Family Agrees to Admission and willing to participate: Yes Program Orientation Provided & Reviewed with Pt/Caregiver Including Roles  & Responsibilities: Yes Additional Information Needs: Son working with CSW on acute to secure an agency for hired caregivers  Barriers to Discharge: Insurance for SNF coverage, Decreased caregiver support  Decrease burden of Care through IP rehab admission: n/a  Possible need for SNF placement upon discharge: not anticipated.  Plan for home with 24/7 care from family and hired caregivers.  Discussed with son, Tametra Koury, on 01/04/23.   Patient Condition: I have reviewed medical records from [facility], spoken with CSW, and patient, son, and daughter. I discussed via phone for inpatient rehabilitation assessment.  Patient will benefit from ongoing PT and OT, can actively participate in 3 hours of therapy a day 5 days of the week, and can make measurable gains during the admission.  Patient will also benefit from the coordinated team approach during an Inpatient Acute Rehabilitation admission.  The patient will receive intensive therapy as well as Rehabilitation physician, nursing, social worker, and care management interventions.  Due to bladder management, safety, skin/wound care, disease  management, medication administration, pain management, and patient education the patient requires 24 hour a day rehabilitation nursing.  The patient is currently min to mod assist with mobility and min to total assist with basic ADLs.  Discharge setting and therapy post discharge at home with home health is anticipated.  Patient has agreed to participate in the Acute Inpatient Rehabilitation Program and will admit ***.  Preadmission Screen Completed By:  Stephania Fragmin , PT, DPT  01/05/2023 10:24 AM ______________________________________________________________________   Discussed status with Dr. Marland Kitchen on *** at *** and received approval for admission today.  Admission Coordinator:  Stephania Fragmin, PT, time 01/05/23 Dorna Bloom 10:24 AM    Assessment/Plan: Diagnosis: Does the need for close, 24 hr/day Medical supervision in concert with the patient's rehab needs make it unreasonable for this patient to be  served in a less intensive setting? {yes_no_potentially:3041433} Co-Morbidities requiring supervision/potential complications: *** Due to {due ZO:1096045}, does the patient require 24 hr/day rehab nursing? {yes_no_potentially:3041433} Does the patient require coordinated care of a physician, rehab nurse, PT, OT, and SLP to address physical and functional deficits in the context of the above medical diagnosis(es)? {yes_no_potentially:3041433} Addressing deficits in the following areas: {deficits:3041436} Can the patient actively participate in an intensive therapy program of at least 3 hrs of therapy 5 days a week? {yes_no_potentially:3041433} The potential for patient to make measurable gains while on inpatient rehab is {potential:3041437} Anticipated functional outcomes upon discharge from inpatient rehab: {functional outcomes:304600100} PT, {functional outcomes:304600100} OT, {functional outcomes:304600100} SLP Estimated rehab length of stay to reach the above functional goals is: *** Anticipated  discharge destination: {anticipated dc setting:21604} 10. Overall Rehab/Functional Prognosis: {potential:3041437}   MD Signature: ***

## 2023-01-04 NOTE — TOC Initial Note (Addendum)
Transition of Care [facility]) - Initial/Assessment Note   Patient Details  Name: [name] [name] MRN: [id number] Date of Birth: 06/10/1932  Transition of Care [facility]) CM/SW Contact:    [name] Schlein, LCSW Phone Number: 01/04/2023, 9:56 AM  Clinical Narrative: PT and OT evaluations recommended inpatient rehab. CSW spoke with son and son is agreeable to having the patient screened by CIR. TOC awaiting CIR assessment.  Addendum: Private duty care list left in room for family. CSW updated son regarding CIR starting insurance authorization.             Expected Discharge Plan: IP Rehab Facility Barriers to Discharge: Continued Medical Work up  Patient Goals and CMS Choice Patient states their goals for this hospitalization and ongoing recovery are:: Get rehab CMS Medicare.gov Compare Post Acute Care list provided to:: Patient Represenative (must comment) Choice offered to / list presented to : Adult Children [name] [name] (son))  Expected Discharge Plan and Services In-house Referral: Clinical Social Work Post Acute Care Choice: IP Rehab Living arrangements for the past 2 months: Single Family Home            DME Arranged: N/A DME Agency: NA  Prior Living Arrangements/Services Living arrangements for the past 2 months: Single Family Home Lives with:: Spouse Patient language and need for interpreter reviewed:: Yes Do you feel safe going back to the place where you live?: Yes      Need for Family Participation in Patient Care: Yes (Comment) (Patient is experiencing some confusion following surgery.) Care giver support system in place?: Yes (comment) Criminal Activity/Legal Involvement Pertinent to Current Situation/Hospitalization: No - Comment as needed  Activities of Daily Living Home Assistive Devices/Equipment: Eyeglasses ADL Screening (condition at time of admission) Patient's cognitive ability adequate to safely complete daily activities?: Yes Is the patient deaf or have difficulty  hearing?: Yes Does the patient have difficulty seeing, even when wearing glasses/contacts?: No Does the patient have difficulty concentrating, remembering, or making decisions?: No Patient able to express need for assistance with ADLs?: Yes Does the patient have difficulty dressing or bathing?: No Independently performs ADLs?: Yes (appropriate for developmental age) Does the patient have difficulty walking or climbing stairs?: Yes Weakness of Legs: Right Weakness of Arms/Hands: None  Permission Sought/Granted Permission sought to share information with : Facility Industrial/product designer granted to share information with : Yes, Verbal Permission Granted Permission granted to share info w AGENCY: CIR, SNFs  Emotional Assessment Orientation: : Oriented to Self, Oriented to Place, Oriented to  Time, Oriented to Situation Alcohol / Substance Use: Not Applicable Psych Involvement: No (comment)  Admission diagnosis:  Closed right hip fracture, initial encounter (HCC) [S72.001A] Closed intertrochanteric fracture of hip, right, initial encounter (HCC) [S72.141A] Patient Active Problem List   Diagnosis Date Noted   Malnutrition of moderate degree 01/02/2023   Closed right hip fracture, initial encounter (HCC) 12/31/2022   Hypocalcemia 12/31/2022   Hyperglycemia 12/31/2022   Prediabetes 12/31/2022   Carpal tunnel syndrome of left wrist 11/05/2022   Fracture of distal end of radius 07/06/2022   Malignant neoplasm of upper-inner quadrant of right breast in female, estrogen receptor positive (HCC) 09/26/2021   Thyroid function study abnormality 05/13/2018   Decreased hearing 10/27/2013   Osteoporosis, unspecified ? hx of fracrture no =dexa done  10/27/2013   Umbilical hernia ? small 10/27/2013   Visit for preventive health examination 10/25/2012   Hearing loss in left ear 10/20/2011   Ex-smoker 02/21/2011   Constipation 07/01/2009   BLOOD  IN STOOL 07/01/2009   FECAL INCONTINENCE  07/01/2009   PERSONAL HISTORY OF COLONIC POLYPS 07/01/2009   Gastrointestinal hemorrhage 07/01/2009   FASTING HYPERGLYCEMIA 04/05/2007   Hyperlipidemia 02/13/2007   Essential hypertension 02/13/2007   INTERSTITIAL CYSTITIS 12/14/2006   OSTEOARTHRITIS 12/14/2006   URINARY INCONTINENCE 12/14/2006   BREAST CANCER, HX OF 12/14/2006   CHICKENPOX, HX OF 12/14/2006   PCP:  [name] Headings, MD Pharmacy:   [facility] [phone] [place], [place] - [address] AT [facility] [address] [facility] [address] [place] [address] Phone: [phone] Fax: [phone]  Social Determinants of Health (SDOH) Social History: SDOH Screenings   Food Insecurity: No Food Insecurity (12/31/2022)  Housing: Low Risk  (12/31/2022)  Transportation Needs: No Transportation Needs (12/31/2022)  Utilities: Not At Risk (12/31/2022)  Depression (PHQ2-9): Medium Risk (11/24/2022)  Financial Resource Strain: Low Risk  (09/23/2021)  Physical Activity: Inactive (09/23/2021)  Social Connections: Unknown (09/19/2019)  Stress: Stress Concern Present (09/23/2021)  Tobacco Use: Medium Risk (01/03/2023)   SDOH Interventions:    Readmission Risk Interventions     No data to display

## 2023-01-04 NOTE — Plan of Care (Signed)
  Problem: Activity: Goal: Ability to ambulate and perform ADLs will improve Outcome: Progressing   Problem: Pain Management: Goal: Pain level will decrease Outcome: Progressing   Problem: Safety: Goal: Ability to remain free from injury will improve Outcome: Progressing   

## 2023-01-04 NOTE — Progress Notes (Addendum)
Inpatient Rehab Admissions Coordinator:   Left message for son to discuss CIR recommendations.    1131:  I spoke with pt's son, Mardelle Matte, and her daughter, Carney Bern, over the phone to discuss CIR recommendations and goals/expectations of CIR stay.  We reviewed 3 hrs/day of therapy, physician follow up, and average length of stay 2 weeks (dependent upon progress) with goals of supervision to min assist.  We discussed that pt may need to stay longer than 2 weeks but likely would not be able to stay more than 4 weeks, depending on progress and insurance.  We reviewed certain need for 24/7 assist at discharge and they are looking into a combination of family and hired caregivers.  I let them know hired caregivers would be out of pocket/not covered by insurance and they are requesting a list from CSW.  I let them know insurance would only cover CIR or SNF but not likely both.  I will start insurance auth request today.     6/3 1425: I received insurance auth for this patient for CIR. I do not have a bed for her to admit today, but will follow for admission pending bed availability.     Estill Dooms, PT, DPT Admissions Coordinator [phone] 01/04/23  10:24 AM

## 2023-01-04 NOTE — Progress Notes (Signed)
[facility] PROGRESS NOTE  [name] [name] (DOB: January 23, 1932) ZOX:[id number] PCP: Madelin Headings, MD  Brief Narrative: [name] [name] is an 89 y.o. female with a history of colon polyps, left breast cancer, interstitial cystitis, urinary incontinence, left ankle fracture, hearing loss, hyperlipidemia, hypertension, osteoarthritis, right rotator cuff tear, herpes zoster who presented to the emergency department 5/30 with right hip pain after mechanical fall. Imaging consistent with R femur intertrochanteric fracture. Orthopedics consulted, hospitalist called for admission. Underwent IM nail 5/31. Postoperative recommendations are for CIR rehabilitation which is being pursued.   Subjective: Pain when moving remains stable, but overall well-controlled. She's HOH and doesn't like people wearing masks but can hear me well. Amenable to rehabilitation, she's feeling down about her current and possibly permanent level of disability. Usually cares for her even more elderly husband at home.   Objective: BP (!) 140/78 (BP Location: Right Arm)   Pulse (!) 52   Temp 98.7 F (37.1 C) (Oral)   Resp 17   Ht 5\' 2"  (1.575 m)   Wt 54.4 kg   SpO2 94%   BMI 21.94 kg/m   Gen: Elderly female in no distress Pulm: Clear, nonlabored  CV: RRR, no MRG, trace dependent edema GI: Soft, NT, ND, +BS  Neuro: Alert and oriented. No new focal deficits. Ext: Warm, no deformities. R surgical dressing LE c/d/i Skin: No other rashes, lesions or ulcers on visualized skin   Assessment & Plan: Closed right hip fracture:  -Orthopedics following - successful IM nail to the right femur 01/01/2023 -Pain currently well-controlled -Supportive care ongoing -PT OT evaluation postoperatively > recommending CIR who is consulted. - ASA for DVT ppx per orthopedics. WBAT RLE with walker.  - DC purewick, use BSC   Acute encephalopathy, likely hospital delirium,resolved -Postoperatively patient had worsening mental status and  confusion with hallucinations -Now resolved - back to baseline(likely undiagnosed dementia underlying) -Discontinued benzos, add low-dose Geodon twice daily and at night as needed. -Family at bedside to assist with reorientation but has known history of hospital delirium per their discussion   Leukocytosis -Questionably reactive in the setting of above -low threshold to repeat imaging or obtain cultures -Afebrile, without symptoms/complaints for infection   Essential hypertension Continue amlodipine 5 mg p.o. daily.   Constipation Continue Senokot   Interstitial cystitis with concurrent incontinence:  - Frequent voiding in bedside commode is recommended. DC purewick.   Malignant neoplasm of upper-inner quadrant  of right breast in female, estrogen receptor positive (HCC) Continue letrozole 2.5 mg p.o. daily. Follow-up with oncology as an outpatient.   Hypocalcemia Last vitamin D level was normal. Calcium level stable, likely to improve once p.o. intake is restarted   Prediabetes Carbohydrate modified diet. CBG monitoring with [facility] SS while in the hospital.   Hyperlipidemia Currently not on therapy. Follow-up with primary care provider.  Tyrone Nine, MD [facility] [email or web address] 01/04/2023, 9:16 AM

## 2023-01-04 NOTE — Plan of Care (Signed)
  Problem: Pain Management: Goal: Pain level will decrease Outcome: Progressing   Problem: Coping: Goal: Level of anxiety will decrease Outcome: Progressing   Problem: Safety: Goal: Ability to remain free from injury will improve Outcome: Progressing   

## 2023-01-05 DIAGNOSIS — S72001A Fracture of unspecified part of neck of right femur, initial encounter for closed fracture: Secondary | ICD-10-CM | POA: Diagnosis not present

## 2023-01-05 LAB — GLUCOSE, CAPILLARY
Glucose-Capillary: 144 mg/dL — ABNORMAL HIGH (ref 70–99)
Glucose-Capillary: 139 mg/dL — ABNORMAL HIGH (ref 70–99)
Glucose-Capillary: 125 mg/dL — ABNORMAL HIGH (ref 70–99)
Glucose-Capillary: 127 mg/dL — ABNORMAL HIGH (ref 70–99)

## 2023-01-05 NOTE — Care Management Important Message (Signed)
Important Message  Patient Details IM Letter placed in room. Name: [name] [name] MRN: [id number] Date of Birth: Sep 16, 1931   Medicare Important Message Given:  Yes     Caren Macadam 01/05/2023, 3:47 PM

## 2023-01-05 NOTE — Progress Notes (Signed)
[facility] PROGRESS NOTE  [name] [name] (DOB: 08/19/1931) ZOX:[id number] PCP: Madelin Headings, MD  Brief Narrative: [name] [name] is an 89 y.o. female with a history of colon polyps, left breast cancer, interstitial cystitis, urinary incontinence, left ankle fracture, hearing loss, hyperlipidemia, hypertension, osteoarthritis, right rotator cuff tear, herpes zoster who presented to the emergency department 5/30 with right hip pain after mechanical fall. Imaging consistent with R femur intertrochanteric fracture. Orthopedics consulted, hospitalist called for admission. Underwent IM nail 5/31. Postoperative recommendations are for CIR rehabilitation which is being pursued. Insurance authorization obtained, awaiting bed availability.   Subjective: No new issues. She had a BM and felt better after that. Eating well. No further delirium, but recounts how bothersome it was.   Objective: BP (!) 140/58 (BP Location: Right Arm)   Pulse 66   Temp 98.7 F (37.1 C)   Resp 17   Ht 5\' 2"  (1.575 m)   Wt 54.4 kg   SpO2 93%   BMI 21.94 kg/m   Gen: Elderly female in no distress Pulm: Clear, nonlabored  CV: RRR, no MRG GI: Soft, NT, ND, +BS  Neuro: Alert and oriented. No new focal deficits. Ext: Warm, no deformities. NVI in BL LE's. Skin: Surgical dressing c/d/I on right hip. No other/new rashes, lesions or ulcers on visualized skin   Assessment & Plan: Closed right hip fracture:  -Orthopedics following - successful IM nail to the right femur 01/01/2023 -Pain currently well-controlled -Supportive care ongoing -PT OT evaluation postoperatively > recommending CIR > insurance authorized, but no bed available today.  - ASA for DVT ppx per orthopedics. WBAT RLE with walker.  - DC purewick, use BSC   Acute encephalopathy, likely hospital delirium: Resolved. Postoperatively patient had worsening mental status and confusion with hallucinations -Consider outpatient dementia screening.   -Discontinued benzos, added geodon twice daily and at night as needed. -Family at bedside to assist with reorientation but has known history of hospital delirium per their discussion   Leukocytosis: Resolved.   HTN: - Continue amlodipine 5 mg p.o. daily.   Constipation: Resolved. - Continue Senokot   Interstitial cystitis with concurrent incontinence:  - Frequent voiding in bedside commode is recommended. DC purewick.   Right breast CA: ER+.   - Continue letrozole 2.5 mg p.o. daily. - Follow-up with oncology as an outpatient.   Hypocalcemia Last vitamin D level was normal. Calcium level stable, likely to improve once p.o. intake is restarted   Prediabetes Carbohydrate modified diet. CBG monitoring with RI SS while in the hospital.   Hyperlipidemia Currently not on therapy. Follow-up with primary care provider.  Tyrone Nine, MD [email or web address] 01/05/2023, 12:27 PM

## 2023-01-05 NOTE — Plan of Care (Signed)
  Problem: Coping: Goal: Level of anxiety will decrease Outcome: Progressing   Problem: Pain Managment: Goal: General experience of comfort will improve Outcome: Progressing   Problem: Safety: Goal: Ability to remain free from injury will improve Outcome: Progressing   

## 2023-01-05 NOTE — Plan of Care (Signed)
  Problem: Activity: Goal: Ability to ambulate and perform ADLs will improve Outcome: Progressing   Problem: Pain Management: Goal: Pain level will decrease Outcome: Progressing   Problem: Safety: Goal: Ability to remain free from injury will improve Outcome: Progressing   

## 2023-01-05 NOTE — Progress Notes (Signed)
Physical Therapy Treatment Patient Details Name: [name] [name] MRN: [id number] DOB: 03-Sep-1931 Today's Date: 01/05/2023   History of Present Illness Pt is a 89 year old woman who fell attempting to retrieve an item from her recycling bin resulting in R hip fx. Underwent IM nail. Post op course complicated by delirium. PMH: L wrist fx, HTN, HLD, interstitial cystitis, breast ca, urinary incontinence, HOH, L ankle fx, R shoulder OA.    PT Comments    Pt  making excellent progress this session, dtr feels cognition has returned to baseline; incr gait distance, initiated HEP and pt tol well, slightly anxious regarding falling. Pleasant and motivated,  d/c plan remains appropriate.   Recommendations for follow up therapy are one component of a multi-disciplinary discharge planning process, led by the attending physician.  Recommendations may be updated based on patient status, additional functional criteria and insurance authorization.  Follow Up Recommendations       Assistance Recommended at Discharge Intermittent Supervision/Assistance  Patient can return home with the following     Equipment Recommendations  Rolling walker (2 wheels) (youth ht)    Recommendations for Other Services       Precautions / Restrictions Precautions Precautions: Fall Restrictions Weight Bearing Restrictions: No RLE Weight Bearing: Weight bearing as tolerated Other Position/Activity Restrictions: HOH     Mobility  Bed Mobility   Bed Mobility: Supine to Sit     Supine to sit: Min assist     General bed mobility comments: multi-moal cues for sequence, self assist. assist with RLE    Transfers Overall transfer level: Needs assistance Equipment used: Rolling walker (2 wheels) Transfers: Sit to/from Stand Sit to Stand: Min assist           General transfer comment: assist with anterior-superior wt shift to full stand, cues for hand placement    Ambulation/Gait Ambulation/Gait  assistance: Min assist, Min guard Gait Distance (Feet): 40 Feet Assistive device: Rolling walker (2 wheels) Gait Pattern/deviations: Step-to pattern, Trunk flexed       General Gait Details: multi-modal cues for sequence and proximity to RW. distance to tolerance   Stairs             Wheelchair Mobility    Modified Rankin (Stroke Patients Only)       Balance     Sitting balance-Leahy Scale: Fair     Standing balance support: Bilateral upper extremity supported, Reliant on assistive device for balance Standing balance-Leahy Scale: Poor                              Cognition Arousal/Alertness: Awake/alert Behavior During Therapy: Anxious Overall Cognitive Status: Within Functional Limits for tasks assessed                         Following Commands: Follows one step commands consistently, Follows multi-step commands with increased time       General Comments: patient was pleasant and very fearful of falling, able to follow directions        Exercises General Exercises - Lower Extremity Ankle Circles/Pumps: AROM, Both, 10 reps Quad Sets: AROM, Strengthening, Both, 10 reps Heel Slides: AAROM, Right, 10 reps    General Comments        Pertinent Vitals/Pain Pain Assessment Pain Assessment: Faces Faces Pain Scale: Hurts little more Pain Location: R hip Pain Descriptors / Indicators: Grimacing, Guarding, Discomfort Pain Intervention(s): Limited activity within patient's tolerance,  Monitored during session, Repositioned, Ice applied, Premedicated before session    Home Living                          Prior Function            PT Goals (current goals can now be found in the care plan section) Acute Rehab PT Goals PT Goal Formulation: With patient/family Time For Goal Achievement: 01/17/23 Potential to Achieve Goals: Good Progress towards PT goals: Progressing toward goals    Frequency    Min 4X/week       PT Plan Current plan remains appropriate    Co-evaluation              AM-PAC PT "6 Clicks" Mobility   Outcome Measure  Help needed turning from your back to your side while in a flat bed without using bedrails?: A Little Help needed moving from lying on your back to sitting on the side of a flat bed without using bedrails?: A Little Help needed moving to and from a bed to a chair (including a wheelchair)?: A Little Help needed standing up from a chair using your arms (e.g., wheelchair or bedside chair)?: A Little Help needed to walk in hospital room?: A Little Help needed climbing 3-5 steps with a railing? : A Lot 6 Click Score: 17    End of Session Equipment Utilized During Treatment: Gait belt Activity Tolerance: Patient tolerated treatment well Patient left: in chair;with call bell/phone within reach;with chair alarm set;with family/visitor present Nurse Communication: Mobility status PT Visit Diagnosis: Difficulty in walking, not elsewhere classified (R26.2);Unsteadiness on feet (R26.81)     Time: 1610-9604 PT Time Calculation (min) (ACUTE ONLY): 25 min  Charges:  $Gait Training: 23-37 mins                     Shine Scrogham, PT  Acute Rehab Dept [facility]) [phone]  01/05/2023    [facility] 01/05/2023, 11:41 AM

## 2023-01-05 NOTE — Progress Notes (Signed)
Inpatient Rehab Admissions Coordinator:   I have no beds available for this patient to admit to CIR today.  Will continue to follow for timing of potential admission pending bed availability. I updated pt's son, Mardelle Matte, over the phone.   Estill Dooms, PT, DPT Admissions Coordinator [phone] 01/05/23  10:20 AM

## 2023-01-05 NOTE — TOC Progression Note (Signed)
Transition of Care [facility]) - Progression Note   Patient Details  Name: [name] [name] MRN: [id number] Date of Birth: [birth year]  Transition [facility]) CM/SW Contact  Ewing Schlein, LCSW Phone Number: 01/05/2023, 11:33 AM  Clinical Narrative: Patient has insurance approval for CIR. TOC awaiting bed availability.  Expected Discharge Plan: IP Rehab Facility Barriers to Discharge: Continued Medical Work up  Expected Discharge Plan and Services In-house Referral: Clinical Social Work Post Acute Care Choice: IP Rehab Living arrangements for the past 2 months: Single Family Home           DME Arranged: N/A DME Agency: NA  Social Determinants of Health (SDOH) Interventions SDOH Screenings   Food Insecurity: No Food Insecurity (12/31/2022)  Housing: Low Risk  (12/31/2022)  Transportation Needs: No Transportation Needs (12/31/2022)  Utilities: Not At Risk (12/31/2022)  Depression (PHQ2-9): Medium Risk (11/24/2022)  Financial Resource Strain: Low Risk  (09/23/2021)  Physical Activity: Inactive (09/23/2021)  Social Connections: Unknown (09/19/2019)  Stress: Stress Concern Present (09/23/2021)  Tobacco Use: Medium Risk (01/03/2023)   Readmission Risk Interventions     No data to display

## 2023-01-06 DIAGNOSIS — K5900 Constipation, unspecified: Secondary | ICD-10-CM

## 2023-01-06 DIAGNOSIS — S72141A Displaced intertrochanteric fracture of right femur, initial encounter for closed fracture: Secondary | ICD-10-CM

## 2023-01-06 DIAGNOSIS — S72001A Fracture of unspecified part of neck of right femur, initial encounter for closed fracture: Secondary | ICD-10-CM | POA: Diagnosis not present

## 2023-01-06 DIAGNOSIS — E7800 Pure hypercholesterolemia, unspecified: Secondary | ICD-10-CM

## 2023-01-06 DIAGNOSIS — I10 Essential (primary) hypertension: Secondary | ICD-10-CM | POA: Diagnosis not present

## 2023-01-06 LAB — GLUCOSE, CAPILLARY
Glucose-Capillary: 94 mg/dL (ref 70–99)
Glucose-Capillary: 113 mg/dL — ABNORMAL HIGH (ref 70–99)
Glucose-Capillary: 119 mg/dL — ABNORMAL HIGH (ref 70–99)
Glucose-Capillary: 125 mg/dL — ABNORMAL HIGH (ref 70–99)

## 2023-01-06 NOTE — TOC Progression Note (Signed)
Transition of Care [facility]) - Progression Note   Patient Details  Name: AMORIAH STARNS MRN: [id number] Date of Birth: 1932/03/07  Transition of [facility]) CM/SW Contact  Ewing Schlein, LCSW Phone Number: 01/06/2023, 9:57 AM  Clinical Narrative: There are no CIR beds available today. TOC expecting possible admission tomorrow.  Expected Discharge Plan: IP Rehab Facility Barriers to Discharge: Continued Medical Work up  Expected Discharge Plan and Services In-house Referral: Clinical Social Work Post Acute Care Choice: IP Rehab Living arrangements for the past 2 months: Single Family Home            DME Arranged: N/A DME Agency: NA  Social Determinants of Health (SDOH) Interventions SDOH Screenings   Food Insecurity: No Food Insecurity (12/31/2022)  Housing: Low Risk  (12/31/2022)  Transportation Needs: No Transportation Needs (12/31/2022)  Utilities: Not At Risk (12/31/2022)  Depression (PHQ2-9): Medium Risk (11/24/2022)  Financial Resource Strain: Low Risk  (09/23/2021)  Physical Activity: Inactive (09/23/2021)  Social Connections: Unknown (09/19/2019)  Stress: Stress Concern Present (09/23/2021)  Tobacco Use: Medium Risk (01/03/2023)   Readmission Risk Interventions     No data to display

## 2023-01-06 NOTE — Plan of Care (Signed)
  Problem: Coping: Goal: Level of anxiety will decrease Outcome: Progressing   Problem: Pain Managment: Goal: General experience of comfort will improve Outcome: Progressing   Problem: Safety: Goal: Ability to remain free from injury will improve Outcome: Progressing   

## 2023-01-06 NOTE — Progress Notes (Signed)
Inpatient Rehab Admissions Coordinator:   I have no beds available for this patient to admit to CIR today.  Will continue to follow for timing of potential admission pending bed availability, hopeful for tomorrow.   Estill Dooms, PT, DPT Admissions Coordinator [phone] 01/06/23  9:51 AM

## 2023-01-06 NOTE — Progress Notes (Signed)
Physical Therapy Treatment Patient Details Name: [name] [name] MRN: [id number] DOB: Jul 16, 1932 Today's Date: 01/06/2023   History of Present Illness Pt is a 89 year old woman who fell attempting to retrieve an item from her recycling bin resulting in R hip fx. Underwent IM nail. Post op course complicated by delirium. PMH: L wrist fx, HTN, HLD, interstitial cystitis, breast ca, urinary incontinence, HOH, L ankle fx, R shoulder OA.    PT Comments    Pt is progressing toward goals, verbalizes fear of falling however appears less anxious overall. Activity tolerance improving--Incr gait distance this am, no LOB, good stability with RW.  Motivated and cooperative with PT.     Recommendations for follow up therapy are one component of a multi-disciplinary discharge planning process, led by the attending physician.  Recommendations may be updated based on patient status, additional functional criteria and insurance authorization.  Follow Up Recommendations       Assistance Recommended at Discharge Intermittent Supervision/Assistance  Patient can return home with the following A lot of help with walking and/or transfers;A lot of help with bathing/dressing/bathroom;Assistance with cooking/housework;Assist for transportation;Help with stairs or ramp for entrance   Equipment Recommendations  Rolling walker (2 wheels) (youth ht - defer to rehab)    Recommendations for Other Services       Precautions / Restrictions Precautions Precautions: Fall Restrictions Weight Bearing Restrictions: No RLE Weight Bearing: Weight bearing as tolerated Other Position/Activity Restrictions: HOH     Mobility  Bed Mobility Overal bed mobility: Needs Assistance Bed Mobility: Supine to Sit     Supine to sit: Min assist     General bed mobility comments: multi-modal cues for sequence, self assist. assist to progress RLE off bed    Transfers Overall transfer level: Needs assistance Equipment used:  Rolling walker (2 wheels) Transfers: Sit to/from Stand Sit to Stand: Min assist, Min guard           General transfer comment: assist with anterior-superior wt shift to full stand, cues for hand placement and trunk extension    Ambulation/Gait Ambulation/Gait assistance: Min assist, Min guard Gait Distance (Feet): 50 Feet Assistive device: Rolling walker (2 wheels) Gait Pattern/deviations: Step-to pattern, Trunk flexed       General Gait Details: multi-modal cues for sequence, upward gaze,  and proximity to RW. distance to tolerance   Stairs             Wheelchair Mobility    Modified Rankin (Stroke Patients Only)       Balance                                            Cognition Arousal/Alertness: Awake/alert Behavior During Therapy: Anxious Overall Cognitive Status: Within Functional Limits for tasks assessed                         Following Commands: Follows one step commands consistently, Follows multi-step commands with increased time       General Comments: patient was pleasant and very fearful of falling, able to follow directions        Exercises General Exercises - Lower Extremity Ankle Circles/Pumps: AROM, Both, 10 reps Quad Sets: AROM, Strengthening, Both, 10 reps Heel Slides: AAROM, Right, 10 reps Hip ABduction/ADduction: AROM, Right, 10 reps    General Comments        Pertinent  Vitals/Pain Pain Assessment Pain Assessment: Faces Pain Score: 4  Pain Location: R hip Pain Descriptors / Indicators: Grimacing, Guarding, Discomfort, Sore Pain Intervention(s): Limited activity within patient's tolerance, Monitored during session, Premedicated before session, Ice applied    Home Living                          Prior Function            PT Goals (current goals can now be found in the care plan section) Acute Rehab PT Goals PT Goal Formulation: With patient/family Time For Goal Achievement:  01/17/23 Potential to Achieve Goals: Good Progress towards PT goals: Progressing toward goals    Frequency    Min 4X/week      PT Plan Current plan remains appropriate    Co-evaluation              AM-PAC PT "6 Clicks" Mobility   Outcome Measure  Help needed turning from your back to your side while in a flat bed without using bedrails?: A Little Help needed moving from lying on your back to sitting on the side of a flat bed without using bedrails?: A Little Help needed moving to and from a bed to a chair (including a wheelchair)?: A Little Help needed standing up from a chair using your arms (e.g., wheelchair or bedside chair)?: A Little Help needed to walk in hospital room?: A Little Help needed climbing 3-5 steps with a railing? : A Lot 6 Click Score: 17    End of Session Equipment Utilized During Treatment: Gait belt Activity Tolerance: Patient tolerated treatment well Patient left: with call bell/phone within reach;in chair;with chair alarm set;with family/visitor present   PT Visit Diagnosis: Difficulty in walking, not elsewhere classified (R26.2);Unsteadiness on feet (R26.81)     Time: 1610-9604 PT Time Calculation (min) (ACUTE ONLY): 40 min  Charges:  $Gait Training: 23-37 mins $Therapeutic Exercise: 8-22 mins                     Delice Bison, PT  Acute Rehab Dept [facility]) [phone]  01/06/2023    [facility] 01/06/2023, 11:40 AM

## 2023-01-06 NOTE — Progress Notes (Signed)
[facility]                                                                              [name] [name], is a 89 y.o. female, DOB - 1932-02-02, ZOX:[id number] Admit date - 12/31/2022    Outpatient Primary MD for the patient is [name], Neta Mends, MD  LOS - 6  days  Chief Complaint  Patient presents with   Hip Pain       Brief summary   Patient is a 89 y.o. female with a history of colon polyps, left breast cancer, interstitial cystitis, urinary incontinence, left ankle fracture, hearing loss, hyperlipidemia, hypertension, osteoarthritis, right rotator cuff tear, herpes zoster who presented to the emergency department 5/30 with right hip pain after mechanical fall.  Imaging consistent with R femur intertrochanteric fracture. Orthopedics consulted,underwent IM nail 5/31. PT recommended CIR, awaiting bed    Assessment & Plan    Principal Problem:   Closed right hip fracture, (HCC) -Orthopedics consulted, underwent IM nail to the right femur on 01/01/2023 -Pain controlled, PT OT recommended CIR, pending bed - ASA for DVT ppx per orthopedics. WBAT RLE with walker.   Active problems Acute encephalopathy, likely hospital delirium: -  Resolved. Postoperatively patient had worsening mental status and confusion with hallucinations -Consider outpatient dementia screening.  -BZD's were discontinued, added geodon twice daily and at night as needed. -Family at bedside to assist with reorientation but has known history of hospital delirium per their discussion   Leukocytosis: - Resolved.   HTN: -BP stable, continue amlodipine 5 mg p.o. daily.   Constipation:  - Resolved. - Continue Senokot   Interstitial cystitis with concurrent incontinence:  - Frequent voiding in bedside commode is recommended. -Pure wick DC'd    Right breast CA: ER+.   - Continue letrozole 2.5 mg p.o. daily. - Follow-up with oncology as an outpatient.   Hypocalcemia Last vitamin D level was  normal. Calcium level stable, likely to improve once p.o. intake is restarted   Prediabetes Carbohydrate modified diet. CBG (last 3)  Recent Labs    01/05/23 2237 01/06/23 0750 01/06/23 1201  GLUCAP 127* 94 113*      Hyperlipidemia -Currently not on therapy, follow-up with PCP  Moderate protein calorie malnutrition Etiology: chronic illness Signs/Symptoms: moderate muscle depletion, severe fat depletion, moderate fat depletion, severe muscle depletion, mild muscle depletion Interventions: MVI, Refer to RD note for recommendations, Education  Estimated body mass index is 21.94 kg/m as calculated from the following:   Height as of this encounter: 5\' 2"  (1.575 m).   Weight as of this encounter: 54.4 kg.  Code Status: DNR DVT Prophylaxis:  enoxaparin (LOVENOX) injection 40 mg Start: 01/02/23 0800 SCDs Start: 01/01/23 1635   Level of Care: Level of care: Telemetry Family Communication: Updated patient Disposition Plan:      Remains inpatient appropriate: Medically stable, pending CIR when bed available   Procedures:  IM nail to right femur 01/01/2023  Consultants:   Orthopedics CIR  Antimicrobials:   Anti-infectives (From admission, onward)    Start     Dose/Rate Route Frequency Ordered Stop   01/01/23 1900  ceFAZolin (ANCEF) IVPB  2g/100 mL premix        2 g 200 mL/hr over 30 Minutes Intravenous Every 6 hours 01/01/23 1634 01/02/23 0815   01/01/23 0700  ceFAZolin (ANCEF) IVPB 2g/100 mL premix        2 g 200 mL/hr over 30 Minutes Intravenous On call to O.R. 12/31/22 1857 01/01/23 1328          Medications  amLODipine  5 mg Oral Daily   ascorbic acid  500 mg Oral Daily   calcium-vitamin D  2 tablet Oral Daily   docusate sodium  100 mg Oral BID   enoxaparin (LOVENOX) injection  40 mg Subcutaneous Q24H   feeding supplement  296 mL Oral Once   insulin aspart  0-9 Units Subcutaneous TID WC   letrozole  2.5 mg Oral Daily   metoprolol succinate  12.5 mg Oral  Daily   multivitamin with minerals  1 tablet Oral Daily   mupirocin ointment   Nasal BID   polyethylene glycol  17 g Oral Daily   senna  1 tablet Oral BID      Subjective:   [name] [name] was seen and examined today.  No acute complaints, awaiting CIR. Denies dizziness, chest pain, shortness of breath, abdominal pain, N/V.  No acute issues overnight.  Objective:   Vitals:   01/05/23 0600 01/05/23 1327 01/05/23 2234 01/06/23 0616  BP: (!) 140/58 (!) 123/51 (!) 140/60 (!) 145/63  Pulse: 66 70 77 73  Resp: 17 16  16   Temp: 98.7 F (37.1 C) 99.6 F (37.6 C) 98.9 F (37.2 C) 98 F (36.7 C)  TempSrc:  Oral Oral Oral  SpO2: 93% 93% 95% 96%  Weight:      Height:        Intake/Output Summary (Last 24 hours) at 01/06/2023 1322 Last data filed at 01/06/2023 0840 Gross per 24 hour  Intake 600 ml  Output 800 ml  Net -200 ml     Wt Readings from Last 3 Encounters:  01/01/23 54.4 kg  11/24/22 54.2 kg  05/20/22 53.3 kg     Exam General: Alert and oriented x 3, NAD, hearing deficit Cardiovascular: S1 S2 auscultated,  RRR Respiratory: Clear to auscultation bilaterally Gastrointestinal: Soft, nontender, nondistended, + bowel sounds Ext: no pedal edema bilaterally Neuro: no new FND's Psych: Normal affect     Data Reviewed:  I have personally reviewed following labs    CBC Lab Results  Component Value Date   WBC 8.8 01/03/2023   RBC 3.55 (L) 01/03/2023   HGB 11.2 (L) 01/03/2023   HCT 34.6 (L) 01/03/2023   MCV 97.5 01/03/2023   MCH 31.5 01/03/2023   PLT 133 (L) 01/03/2023   MCHC 32.4 01/03/2023   RDW 12.8 01/03/2023   LYMPHSABS 0.6 (L) 12/31/2022   MONOABS 0.4 12/31/2022   EOSABS 0.1 12/31/2022   BASOSABS 0.0 12/31/2022     Last metabolic panel Lab Results  Component Value Date   NA 138 01/03/2023   K 4.1 01/03/2023   CL 107 01/03/2023   CO2 25 01/03/2023   BUN 15 01/03/2023   CREATININE 0.72 01/03/2023   GLUCOSE 100 (H) 01/03/2023   GFRNONAA >60  01/03/2023   GFRAA 60 (L) 12/17/2017   CALCIUM 7.8 (L) 01/03/2023   PHOS 3.3 12/31/2022   PROT 6.5 01/01/2023   ALBUMIN 3.8 01/01/2023   BILITOT 1.4 (H) 01/01/2023   ALKPHOS 49 01/01/2023   AST 30 01/01/2023   ALT 26 01/01/2023   ANIONGAP 6 01/03/2023  CBG (last 3)  Recent Labs    01/05/23 2237 01/06/23 0750 01/06/23 1201  GLUCAP 127* 94 113*      Coagulation Profile: Recent Labs  Lab 12/31/22 1330  INR 1.1     Radiology Studies: I have personally reviewed the imaging studies  No results found.     Thad Ranger M.D. [facility] 01/06/2023, 1:22 PM  Available via Epic secure chat 7am-7pm After 7 pm, please refer to night coverage provider listed on amion.

## 2023-01-07 ENCOUNTER — Other Ambulatory Visit: Payer: Self-pay

## 2023-01-07 ENCOUNTER — Inpatient Hospital Stay (HOSPITAL_COMMUNITY)
Admission: RE | Admit: 2023-01-07 | Discharge: 2023-01-20 | DRG: 560 | Disposition: A | Discharge status: Home Health | Payer: Medicare Other | Source: Intra-hospital | Attending: Physical Medicine & Rehabilitation | Admitting: Physical Medicine & Rehabilitation

## 2023-01-07 ENCOUNTER — Encounter (HOSPITAL_COMMUNITY): Payer: Self-pay | Admitting: Physical Medicine & Rehabilitation

## 2023-01-07 DIAGNOSIS — E785 Hyperlipidemia, unspecified: Secondary | ICD-10-CM | POA: Diagnosis present

## 2023-01-07 DIAGNOSIS — I10 Essential (primary) hypertension: Secondary | ICD-10-CM | POA: Diagnosis present

## 2023-01-07 DIAGNOSIS — M1990 Unspecified osteoarthritis, unspecified site: Secondary | ICD-10-CM | POA: Diagnosis present

## 2023-01-07 DIAGNOSIS — D62 Acute posthemorrhagic anemia: Secondary | ICD-10-CM | POA: Diagnosis present

## 2023-01-07 DIAGNOSIS — Z808 Family history of malignant neoplasm of other organs or systems: Secondary | ICD-10-CM

## 2023-01-07 DIAGNOSIS — R638 Other symptoms and signs concerning food and fluid intake: Secondary | ICD-10-CM | POA: Diagnosis not present

## 2023-01-07 DIAGNOSIS — R1011 Right upper quadrant pain: Secondary | ICD-10-CM | POA: Diagnosis not present

## 2023-01-07 DIAGNOSIS — R7989 Other specified abnormal findings of blood chemistry: Secondary | ICD-10-CM | POA: Diagnosis not present

## 2023-01-07 DIAGNOSIS — Z86010 Personal history of colonic polyps: Secondary | ICD-10-CM | POA: Diagnosis not present

## 2023-01-07 DIAGNOSIS — F05 Delirium due to known physiological condition: Secondary | ICD-10-CM | POA: Diagnosis not present

## 2023-01-07 DIAGNOSIS — H9190 Unspecified hearing loss, unspecified ear: Secondary | ICD-10-CM | POA: Diagnosis not present

## 2023-01-07 DIAGNOSIS — Z87891 Personal history of nicotine dependence: Secondary | ICD-10-CM | POA: Diagnosis not present

## 2023-01-07 DIAGNOSIS — Z882 Allergy status to sulfonamides status: Secondary | ICD-10-CM

## 2023-01-07 DIAGNOSIS — N3010 Interstitial cystitis (chronic) without hematuria: Secondary | ICD-10-CM | POA: Diagnosis not present

## 2023-01-07 DIAGNOSIS — R7303 Prediabetes: Secondary | ICD-10-CM | POA: Diagnosis present

## 2023-01-07 DIAGNOSIS — M7989 Other specified soft tissue disorders: Secondary | ICD-10-CM | POA: Diagnosis not present

## 2023-01-07 DIAGNOSIS — W1830XD Fall on same level, unspecified, subsequent encounter: Secondary | ICD-10-CM | POA: Diagnosis not present

## 2023-01-07 DIAGNOSIS — R110 Nausea: Secondary | ICD-10-CM | POA: Diagnosis not present

## 2023-01-07 DIAGNOSIS — Z853 Personal history of malignant neoplasm of breast: Secondary | ICD-10-CM

## 2023-01-07 DIAGNOSIS — S72141A Displaced intertrochanteric fracture of right femur, initial encounter for closed fracture: Secondary | ICD-10-CM | POA: Diagnosis present

## 2023-01-07 DIAGNOSIS — S72141D Displaced intertrochanteric fracture of right femur, subsequent encounter for closed fracture with routine healing: Principal | ICD-10-CM

## 2023-01-07 DIAGNOSIS — K5900 Constipation, unspecified: Secondary | ICD-10-CM | POA: Diagnosis not present

## 2023-01-07 DIAGNOSIS — I959 Hypotension, unspecified: Secondary | ICD-10-CM | POA: Diagnosis not present

## 2023-01-07 DIAGNOSIS — Z888 Allergy status to other drugs, medicaments and biological substances status: Secondary | ICD-10-CM | POA: Diagnosis not present

## 2023-01-07 DIAGNOSIS — M25551 Pain in right hip: Secondary | ICD-10-CM | POA: Diagnosis not present

## 2023-01-07 DIAGNOSIS — Z9012 Acquired absence of left breast and nipple: Secondary | ICD-10-CM | POA: Diagnosis not present

## 2023-01-07 DIAGNOSIS — Z79811 Long term (current) use of aromatase inhibitors: Secondary | ICD-10-CM | POA: Diagnosis not present

## 2023-01-07 DIAGNOSIS — N3941 Urge incontinence: Secondary | ICD-10-CM | POA: Diagnosis not present

## 2023-01-07 DIAGNOSIS — F5104 Psychophysiologic insomnia: Secondary | ICD-10-CM | POA: Diagnosis present

## 2023-01-07 DIAGNOSIS — J90 Pleural effusion, not elsewhere classified: Secondary | ICD-10-CM | POA: Diagnosis not present

## 2023-01-07 DIAGNOSIS — Z90710 Acquired absence of both cervix and uterus: Secondary | ICD-10-CM

## 2023-01-07 DIAGNOSIS — S52509A Unspecified fracture of the lower end of unspecified radius, initial encounter for closed fracture: Secondary | ICD-10-CM | POA: Diagnosis not present

## 2023-01-07 DIAGNOSIS — S72001A Fracture of unspecified part of neck of right femur, initial encounter for closed fracture: Secondary | ICD-10-CM | POA: Diagnosis not present

## 2023-01-07 DIAGNOSIS — K5901 Slow transit constipation: Secondary | ICD-10-CM | POA: Diagnosis not present

## 2023-01-07 DIAGNOSIS — R1110 Vomiting, unspecified: Secondary | ICD-10-CM | POA: Diagnosis not present

## 2023-01-07 DIAGNOSIS — Z8249 Family history of ischemic heart disease and other diseases of the circulatory system: Secondary | ICD-10-CM

## 2023-01-07 DIAGNOSIS — R109 Unspecified abdominal pain: Secondary | ICD-10-CM | POA: Diagnosis not present

## 2023-01-07 DIAGNOSIS — K449 Diaphragmatic hernia without obstruction or gangrene: Secondary | ICD-10-CM | POA: Diagnosis present

## 2023-01-07 DIAGNOSIS — Z66 Do not resuscitate: Secondary | ICD-10-CM | POA: Diagnosis present

## 2023-01-07 DIAGNOSIS — Z79899 Other long term (current) drug therapy: Secondary | ICD-10-CM

## 2023-01-07 DIAGNOSIS — K30 Functional dyspepsia: Secondary | ICD-10-CM | POA: Diagnosis not present

## 2023-01-07 DIAGNOSIS — R140 Abdominal distension (gaseous): Secondary | ICD-10-CM | POA: Diagnosis not present

## 2023-01-07 DIAGNOSIS — S72144D Nondisplaced intertrochanteric fracture of right femur, subsequent encounter for closed fracture with routine healing: Secondary | ICD-10-CM | POA: Diagnosis not present

## 2023-01-07 HISTORY — DX: Displaced intertrochanteric fracture of right femur, initial encounter for closed fracture: S72.141A

## 2023-01-07 LAB — GLUCOSE, CAPILLARY
Glucose-Capillary: 123 mg/dL — ABNORMAL HIGH (ref 70–99)
Glucose-Capillary: 124 mg/dL — ABNORMAL HIGH (ref 70–99)

## 2023-01-07 MED ORDER — ZIPRASIDONE HCL 20 MG PO CAPS
20.0000 mg | ORAL_CAPSULE | Freq: Two times a day (BID) | ORAL | Status: DC | PRN
  Administered 2023-01-16: 20 mg via ORAL
  Filled 2023-01-07 (×2): qty 1

## 2023-01-07 MED ORDER — HYDROCODONE-ACETAMINOPHEN 5-325 MG PO TABS
1.0000 | ORAL_TABLET | ORAL | Status: DC | PRN
  Administered 2023-01-10 – 2023-01-14 (×4): 1 via ORAL
  Administered 2023-01-15 – 2023-01-16 (×2): 2 via ORAL
  Administered 2023-01-16 – 2023-01-19 (×6): 1 via ORAL
  Filled 2023-01-07: qty 1
  Filled 2023-01-07: qty 2
  Filled 2023-01-07 (×3): qty 1
  Filled 2023-01-07: qty 2
  Filled 2023-01-07 (×7): qty 1

## 2023-01-07 MED ORDER — VITAMIN C 500 MG PO TABS
500.0000 mg | ORAL_TABLET | Freq: Every day | ORAL | Status: DC
  Administered 2023-01-08 – 2023-01-20 (×10): 500 mg via ORAL
  Filled 2023-01-07 (×11): qty 1

## 2023-01-07 MED ORDER — DOCUSATE SODIUM 100 MG PO CAPS: 100.0000 mg | ORAL_CAPSULE | Freq: Two times a day (BID) | ORAL | 0 refills | Status: DC

## 2023-01-07 MED ORDER — POLYETHYLENE GLYCOL 3350 17 G PO PACK
17.0000 g | PACK | Freq: Every day | ORAL | Status: DC
  Administered 2023-01-08 – 2023-01-16 (×8): 17 g via ORAL
  Filled 2023-01-07 (×9): qty 1

## 2023-01-07 MED ORDER — OYSTER SHELL CALCIUM/D3 500-5 MG-MCG PO TABS
2.0000 | ORAL_TABLET | Freq: Every day | ORAL | Status: DC
  Administered 2023-01-08 – 2023-01-20 (×10): 2 via ORAL
  Filled 2023-01-07 (×13): qty 2

## 2023-01-07 MED ORDER — LORATADINE 10 MG PO TABS: 10.0000 mg | ORAL_TABLET | Freq: Every day | ORAL | Status: DC | PRN

## 2023-01-07 MED ORDER — ENOXAPARIN SODIUM 40 MG/0.4ML IJ SOSY: 40.0000 mg | PREFILLED_SYRINGE | INTRAMUSCULAR | Status: DC

## 2023-01-07 MED ORDER — DOCUSATE SODIUM 100 MG PO CAPS
100.0000 mg | ORAL_CAPSULE | Freq: Two times a day (BID) | ORAL | Status: DC
  Administered 2023-01-07 – 2023-01-16 (×18): 100 mg via ORAL
  Filled 2023-01-07 (×18): qty 1

## 2023-01-07 MED ORDER — METHOCARBAMOL 1000 MG/10ML IJ SOLN: 500.0000 mg | Freq: Four times a day (QID) | INTRAVENOUS | Status: DC | PRN

## 2023-01-07 MED ORDER — ENOXAPARIN SODIUM 40 MG/0.4ML IJ SOSY
40.0000 mg | PREFILLED_SYRINGE | INTRAMUSCULAR | Status: DC
  Administered 2023-01-08 – 2023-01-13 (×6): 40 mg via SUBCUTANEOUS
  Filled 2023-01-07 (×6): qty 0.4

## 2023-01-07 MED ORDER — ONDANSETRON HCL 4 MG/2ML IJ SOLN: 4.0000 mg | Freq: Four times a day (QID) | INTRAMUSCULAR | Status: DC | PRN

## 2023-01-07 MED ORDER — ACETAMINOPHEN 325 MG PO TABS
325.0000 mg | ORAL_TABLET | Freq: Four times a day (QID) | ORAL | Status: DC | PRN
  Administered 2023-01-15: 650 mg via ORAL
  Filled 2023-01-07: qty 2

## 2023-01-07 MED ORDER — METOPROLOL SUCCINATE ER 25 MG PO TB24: 12.5000 mg | ORAL_TABLET | Freq: Every day | ORAL | 2 refills | Status: DC

## 2023-01-07 MED ORDER — METOPROLOL SUCCINATE ER 25 MG PO TB24
12.5000 mg | ORAL_TABLET | Freq: Every day | ORAL | Status: DC
  Administered 2023-01-08 – 2023-01-20 (×12): 12.5 mg via ORAL
  Filled 2023-01-07 (×13): qty 1

## 2023-01-07 MED ORDER — AMLODIPINE BESYLATE 5 MG PO TABS
5.0000 mg | ORAL_TABLET | Freq: Every day | ORAL | Status: DC
  Administered 2023-01-08: 5 mg via ORAL
  Filled 2023-01-07 (×2): qty 1

## 2023-01-07 MED ORDER — INSULIN ASPART 100 UNIT/ML IJ SOLN
0.0000 [IU] | Freq: Three times a day (TID) | INTRAMUSCULAR | Status: DC
  Administered 2023-01-07: 1 [IU] via SUBCUTANEOUS

## 2023-01-07 MED ORDER — POLYETHYLENE GLYCOL 3350 17 G PO PACK: 17.0000 g | PACK | Freq: Every day | ORAL | 0 refills | Status: AC

## 2023-01-07 MED ORDER — ZIPRASIDONE HCL 20 MG PO CAPS: 20.0000 mg | ORAL_CAPSULE | Freq: Two times a day (BID) | ORAL | 2 refills | Status: DC | PRN

## 2023-01-07 MED ORDER — SENNA 8.6 MG PO TABS
1.0000 | ORAL_TABLET | Freq: Two times a day (BID) | ORAL | Status: DC
  Administered 2023-01-07 – 2023-01-16 (×18): 8.6 mg via ORAL
  Filled 2023-01-07 (×18): qty 1

## 2023-01-07 MED ORDER — ADULT MULTIVITAMIN W/MINERALS CH
1.0000 | ORAL_TABLET | Freq: Every day | ORAL | Status: DC
  Administered 2023-01-08 – 2023-01-20 (×10): 1 via ORAL
  Filled 2023-01-07 (×11): qty 1

## 2023-01-07 MED ORDER — LETROZOLE 2.5 MG PO TABS
2.5000 mg | ORAL_TABLET | Freq: Every day | ORAL | Status: DC
  Administered 2023-01-08 – 2023-01-20 (×12): 2.5 mg via ORAL
  Filled 2023-01-07 (×14): qty 1

## 2023-01-07 MED ORDER — ONDANSETRON HCL 4 MG PO TABS
4.0000 mg | ORAL_TABLET | Freq: Four times a day (QID) | ORAL | Status: DC | PRN
  Administered 2023-01-15 – 2023-01-16 (×2): 4 mg via ORAL
  Filled 2023-01-07 (×2): qty 1

## 2023-01-07 MED ORDER — METHOCARBAMOL 500 MG PO TABS
500.0000 mg | ORAL_TABLET | Freq: Four times a day (QID) | ORAL | Status: DC | PRN
  Administered 2023-01-16: 500 mg via ORAL
  Filled 2023-01-07 (×2): qty 1

## 2023-01-07 MED ORDER — SENNA 8.6 MG PO TABS: 1.0000 | ORAL_TABLET | Freq: Two times a day (BID) | ORAL | 0 refills | Status: DC

## 2023-01-07 NOTE — Progress Notes (Signed)
Physical Therapy Treatment Patient Details Name: [name] [name] MRN: [id number] DOB: [birth year] Today's Date: 01/07/2023   History of Present Illness Pt is a 89 year old woman who fell attempting to retrieve an item from her recycling bin resulting in R hip fx. Underwent IM nail. Post op course complicated by delirium. PMH: L wrist fx, HTN, HLD, interstitial cystitis, breast ca, urinary incontinence, HOH, L ankle fx, R shoulder OA.    PT Comments    Pt progressing toward goals. D/c plan remains appropriate   Recommendations for follow up therapy are one component of a multi-disciplinary discharge planning process, led by the attending physician.  Recommendations may be updated based on patient status, additional functional criteria and insurance authorization.  Follow Up Recommendations       Assistance Recommended at Discharge Intermittent Supervision/Assistance  Patient can return home with the following A lot of help with walking and/or transfers;A lot of help with bathing/dressing/bathroom;Assistance with cooking/housework;Assist for transportation;Help with stairs or ramp for entrance   Equipment Recommendations  Rolling walker (2 wheels)    Recommendations for Other Services       Precautions / Restrictions Precautions Precautions: Fall Restrictions Weight Bearing Restrictions: No RLE Weight Bearing: Weight bearing as tolerated Other Position/Activity Restrictions: hearing impaired     Mobility  Bed Mobility                    Transfers Overall transfer level: Needs assistance Equipment used: Rolling walker (2 wheels) Transfers: Sit to/from Stand Sit to Stand: Min assist, Min guard           General transfer comment: assist with anterior-superior wt shift to full stand, cues for hand placement and trunk extension    Ambulation/Gait Ambulation/Gait assistance: Min assist, Min guard Gait Distance (Feet): 45 Feet Assistive device: Rolling walker (2  wheels) Gait Pattern/deviations: Step-to pattern, Trunk flexed       General Gait Details: multi-modal cues for sequence, upward gaze,  and proximity to RW. distance to tolerance   Stairs             Wheelchair Mobility    Modified Rankin (Stroke Patients Only)       Balance     Sitting balance-Leahy Scale: Fair     Standing balance support: Bilateral upper extremity supported, Reliant on assistive device for balance Standing balance-Leahy Scale: Poor                              Cognition Arousal/Alertness: Awake/alert Behavior During Therapy: WFL for tasks assessed/performed Overall Cognitive Status: Within Functional Limits for tasks assessed                         Following Commands: Follows one step commands consistently, Follows multi-step commands with increased time       General Comments: friendly, able follow commands, slightly anxious        Exercises General Exercises - Lower Extremity Ankle Circles/Pumps: AROM, Both, 10 reps Heel Slides: AAROM, Right, 10 reps Hip ABduction/ADduction: AROM, Right, 15 reps    General Comments        Pertinent Vitals/Pain Pain Assessment Pain Assessment: Faces Faces Pain Scale: Hurts a little bit Pain Location: R hip Pain Intervention(s): Limited activity within patient's tolerance, Monitored during session, Repositioned, Premedicated before session    Home Living  Prior Function            PT Goals (current goals can now be found in the care plan section) Acute Rehab PT Goals PT Goal Formulation: With patient/family Time For Goal Achievement: 01/17/23 Potential to Achieve Goals: Good Progress towards PT goals: Progressing toward goals    Frequency    Min 4X/week      PT Plan Current plan remains appropriate    Co-evaluation              AM-PAC PT "6 Clicks" Mobility   Outcome Measure  Help needed turning from your back  to your side while in a flat bed without using bedrails?: A Little Help needed moving from lying on your back to sitting on the side of a flat bed without using bedrails?: A Little Help needed moving to and from a bed to a chair (including a wheelchair)?: A Little Help needed standing up from a chair using your arms (e.g., wheelchair or bedside chair)?: A Little Help needed to walk in hospital room?: A Little Help needed climbing 3-5 steps with a railing? : A Little 6 Click Score: 18    End of Session Equipment Utilized During Treatment: Gait belt Activity Tolerance: Patient tolerated treatment well Patient left: with call bell/phone within reach;in chair;with chair alarm set;with family/visitor present Nurse Communication: Mobility status PT Visit Diagnosis: Difficulty in walking, not elsewhere classified (R26.2);Unsteadiness on feet (R26.81)     Time: 1109-1130 PT Time Calculation (min) (ACUTE ONLY): 21 min  Charges:  $Gait Training: 8-22 mins                     Delice Bison, PT  Acute Rehab Dept [facility]) [phone]  01/07/2023    [facility] 01/07/2023, 11:39 AM

## 2023-01-07 NOTE — Discharge Summary (Signed)
Physician Discharge Summary   Patient: [name] [name] MRN: [id number] DOB: 01/23/1932  Admit date:     12/31/2022  Discharge date: 01/07/23  Discharge Physician: Thad Ranger   PCP: Madelin Headings, MD   Recommendations at discharge:   Outpatient follow-up with orthopedics in 2 weeks Aspirin for DVT prophylaxis WBAT RLE with walker  Discharge Diagnoses:    Closed right hip fracture (HCC)   Hyperlipidemia   Essential hypertension   Constipation   INTERSTITIAL CYSTITIS   Malignant neoplasm of upper-inner quadrant of right breast in female, estrogen receptor positive (HCC)   Hypocalcemia   Prediabetes   Malnutrition of moderate degree   Hospital Course:  Patient is a 89 y.o. female with a history of colon polyps, left breast cancer, interstitial cystitis, urinary incontinence, left ankle fracture, hearing loss, hyperlipidemia, hypertension, osteoarthritis, right rotator cuff tear, herpes zoster who presented to the emergency department 5/30 with right hip pain after mechanical fall.  Imaging consistent with R femur intertrochanteric fracture. Orthopedics consulted,underwent IM nail 5/31. PT recommended CIR   Assessment and Plan:  Closed right hip fracture, (HCC) -Orthopedics consulted, underwent IM nail to the right femur on 01/01/2023 -Pain controlled, PT OT recommended CIR - ASA for DVT ppx per orthopedics. WBAT RLE with walker.   -Outpatient follow-up with orthopedics in 2 weeks  Acute encephalopathy, likely hospital delirium: -  Resolved. Postoperatively patient had worsening mental status and confusion with hallucinations -Consider outpatient dementia screening.  -BZD's were discontinued, added geodon twice daily and at night as needed. - has known history of hospital delirium    Leukocytosis: - Resolved.   HTN: -BP stable, continue amlodipine 5 mg p.o. daily.   Constipation:  - Resolved. - Continue Senokot, Colace, MiraLAX   Interstitial cystitis with  concurrent incontinence:  - Frequent voiding in bedside commode is recommended. -Pure wick DC'd    Right breast CA: ER+.   - Continue letrozole 2.5 mg p.o. daily. - Follow-up with oncology as an outpatient.   Hypocalcemia Last vitamin D level was normal. Calcium level stable, likely to improve once p.o. intake is restarted   Prediabetes Carbohydrate modified diet. -Continue SSI while inpatient     Hyperlipidemia -Currently not on therapy, follow-up with PCP   Moderate protein calorie malnutrition Etiology: chronic illness Signs/Symptoms: moderate muscle depletion, severe fat depletion, moderate fat depletion, severe muscle depletion, mild muscle depletion Interventions: MVI, Refer to RD note for recommendations, Education   Estimated body mass index is 21.94 kg/m as calculated from the following:   Height as of this encounter: 5\' 2"  (1.575 m).   Weight as of this encounter: 54.4 kg.        Pain control - [employer] Controlled Substance Reporting System database was reviewed. and patient was instructed, not to drive, operate heavy machinery, perform activities at heights, swimming or participation in water activities or provide baby-sitting services while on Pain, Sleep and Anxiety Medications; until their outpatient Physician has advised to do so again. Also recommended to not to take more than prescribed Pain, Sleep and Anxiety Medications.  Consultants: Orthopedics, CIR Procedures performed: IM nail to right femur 01/01/2023   Disposition: Rehabilitation facility Diet recommendation: Carb modified diet  DISCHARGE MEDICATION: Allergies as of 01/07/2023       Reactions   Nitrofurantoin Itching   Head to toe itching   Sulfamethoxazole    Unknown reaction         Medication List     STOP taking these medications  MELATONIN PO       TAKE these medications    amLODipine 5 MG tablet Commonly known as: NORVASC TAKE 1 TABLET(5 MG) BY MOUTH DAILY What  changed: See the new instructions.   ascorbic acid 500 MG tablet Commonly known as: VITAMIN C Take 500 mg by mouth daily.   aspirin 81 MG chewable tablet Commonly known as: Aspirin Childrens Chew 1 tablet (81 mg total) by mouth 2 (two) times daily with a meal.   Calcium Carb-Cholecalciferol 600-800 MG-UNIT Tabs Take 2 tablets by mouth daily.   CENTRUM SILVER PO Take 1 tablet by mouth daily.   docusate sodium 100 MG capsule Commonly known as: COLACE Take 1 capsule (100 mg total) by mouth 2 (two) times daily.   HYDROcodone-acetaminophen 5-325 MG tablet Commonly known as: NORCO/VICODIN Take 1 tablet by mouth every 4 (four) hours as needed for up to 7 days for moderate pain or severe pain.   letrozole 2.5 MG tablet Commonly known as: FEMARA TAKE 1 TABLET(2.5 MG) BY MOUTH DAILY   loratadine 10 MG tablet Commonly known as: CLARITIN Take 10 mg by mouth daily as needed for allergies.   metoprolol succinate 25 MG 24 hr tablet Commonly known as: TOPROL-XL Take 0.5 tablets (12.5 mg total) by mouth daily. Start taking on: January 08, 2023   polyethylene glycol 17 g packet Commonly known as: MIRALAX / GLYCOLAX Take 17 g by mouth daily. Start taking on: January 08, 2023   senna 8.6 MG Tabs tablet Commonly known as: SENOKOT Take 1 tablet (8.6 mg total) by mouth 2 (two) times daily.   ziprasidone 20 MG capsule Commonly known as: GEODON Take 1 capsule (20 mg total) by mouth 3 times/day as needed-between meals & bedtime (agitation/anxiety/combative).        Follow-up Information     Clois Dupes, [place]. Schedule an appointment as soon as possible for a visit in 2 week(s).   Specialty: Orthopedic Surgery Why: For suture removal, For wound re-check Contact information: [address] [place] [address] [phone]         Madelin Headings, MD. Schedule an appointment as soon as possible for a visit in 2 week(s).   Specialties: Internal Medicine,  Pediatrics Contact information: [address] [place] [address] [phone]                Discharge Exam: Ceasar Mons Weights   12/31/22 1723 01/01/23 1232  Weight: 54.2 kg 54.4 kg   S: No acute complaints, looking forward to starting rehab today  BP (!) 120/55   Pulse (!) 55   Temp 98.7 F (37.1 C) (Oral)   Resp 18   Ht 5\' 2"  (1.575 m)   Wt 54.4 kg   SpO2 95%   BMI 21.94 kg/m    Physical Exam General: Alert and oriented x 3, NAD Cardiovascular: S1 S2 clear, RRR.  Respiratory: CTAB, no wheezing Gastrointestinal: Soft, nontender, nondistended, NBS Ext: no pedal edema bilaterally Neuro: no new deficits Psych: Normal affect   Condition at discharge: fair  The results of significant diagnostics from this hospitalization (including imaging, microbiology, ancillary and laboratory) are listed below for reference.   Imaging Studies: ECHOCARDIOGRAM COMPLETE  Result Date: 01/02/2023    ECHOCARDIOGRAM REPORT   Patient Name:   LAMARIAH HOJNACKI Date of Exam: 01/02/2023 Medical Rec #:  [id number]        Height:       62.0 in Accession #:    [id number]  Weight:       119.9 lb Date of Birth:  1932/07/01        BSA:          1.538 m Patient Age:    89 years         BP:           141/77 mmHg Patient Gender: F                HR:           50 bpm. Exam Location:  Inpatient Procedure: 2D Echo, Cardiac Doppler and Color Doppler Indications:    Abnormal ECG R94.31                 Cardiomegaly I51.7  History:        Patient has no prior history of Echocardiogram examinations.                 Risk Factors:Hypertension and Dyslipidemia.  Sonographer:    Dondra Prader RVT RCS Referring Phys: [id number] DAVID MANUEL ORTIZ  Sonographer Comments: Image acquisition challenging due to respiratory motion. IMPRESSIONS  1. Left ventricular ejection fraction, by estimation, is 60 to 65%. The left ventricle has normal function. The left ventricle has no regional wall motion abnormalities. Left  ventricular diastolic parameters are consistent with Grade I diastolic dysfunction (impaired relaxation).  2. Right ventricular systolic function is normal. The right ventricular size is normal. There is normal pulmonary artery systolic pressure. The estimated right ventricular systolic pressure is 11.9 mmHg.  3. Left atrial size was severely dilated.  4. Right atrial size was severely dilated.  5. The mitral valve is normal in structure. Mild mitral valve regurgitation. No evidence of mitral stenosis.  6. The aortic valve is tricuspid. Aortic valve regurgitation is not visualized. No aortic stenosis is present.  7. The inferior vena cava is normal in size with greater than 50% respiratory variability, suggesting right atrial pressure of 3 mmHg. FINDINGS  Left Ventricle: Left ventricular ejection fraction, by estimation, is 60 to 65%. The left ventricle has normal function. The left ventricle has no regional wall motion abnormalities. The left ventricular internal cavity size was normal in size. There is  no left ventricular hypertrophy. Left ventricular diastolic parameters are consistent with Grade I diastolic dysfunction (impaired relaxation). Right Ventricle: The right ventricular size is normal. No increase in right ventricular wall thickness. Right ventricular systolic function is normal. There is normal pulmonary artery systolic pressure. The tricuspid regurgitant velocity is 1.49 m/s, and  with an assumed right atrial pressure of 3 mmHg, the estimated right ventricular systolic pressure is 11.9 mmHg. Left Atrium: Left atrial size was severely dilated. Right Atrium: Right atrial size was severely dilated. Pericardium: There is no evidence of pericardial effusion. Mitral Valve: The mitral valve is normal in structure. Mild mitral valve regurgitation. No evidence of mitral valve stenosis. Tricuspid Valve: The tricuspid valve is normal in structure. Tricuspid valve regurgitation is mild . No evidence of  tricuspid stenosis. Aortic Valve: The aortic valve is tricuspid. Aortic valve regurgitation is not visualized. No aortic stenosis is present. Aortic valve mean gradient measures 4.5 mmHg. Aortic valve peak gradient measures 8.7 mmHg. Aortic valve area, by VTI measures 1.86 cm. Pulmonic Valve: The pulmonic valve was normal in structure. Pulmonic valve regurgitation is not visualized. No evidence of pulmonic stenosis. Aorta: The aortic root is normal in size and structure. Venous: The inferior vena cava is normal in size with greater than 50% respiratory  variability, suggesting right atrial pressure of 3 mmHg. IAS/Shunts: No atrial level shunt detected by color flow Doppler.  LEFT VENTRICLE PLAX 2D LVIDd:         4.40 cm   Diastology LVIDs:         3.10 cm   LV e' medial:  10.30 cm/s LV PW:         0.80 cm   LV e' lateral: 11.40 cm/s LV IVS:        1.00 cm LVOT diam:     1.70 cm LV SV:         58 LV SV Index:   38 LVOT Area:     2.27 cm  RIGHT VENTRICLE          IVC RV Basal diam:  3.90 cm  IVC diam: 1.90 cm LEFT ATRIUM              Index        RIGHT ATRIUM           Index LA diam:        2.60 cm  1.69 cm/m   RA Area:     21.50 cm LA Vol (A2C):   107.0 ml 69.56 ml/m  RA Volume:   68.20 ml  44.34 ml/m LA Vol (A4C):   108.0 ml 70.21 ml/m LA Biplane Vol: 110.0 ml 71.51 ml/m  AORTIC VALVE                     PULMONIC VALVE AV Area (Vmax):    1.95 cm      PV Vmax:       0.94 m/s AV Area (Vmean):   1.92 cm      PV Peak grad:  3.5 mmHg AV Area (VTI):     1.86 cm AV Vmax:           147.50 cm/s AV Vmean:          101.150 cm/s AV VTI:            0.311 m AV Peak Grad:      8.7 mmHg AV Mean Grad:      4.5 mmHg LVOT Vmax:         127.00 cm/s LVOT Vmean:        85.750 cm/s LVOT VTI:          0.255 m LVOT/AV VTI ratio: 0.82  AORTA Ao Root diam: 2.70 cm Ao Asc diam:  3.50 cm TRICUSPID VALVE TR Peak grad:   8.9 mmHg TR Vmax:        149.00 cm/s  SHUNTS Systemic VTI:  0.26 m Systemic Diam: 1.70 cm Donato Schultz MD  Electronically signed by Donato Schultz MD Signature Date/Time: 01/02/2023/12:53:58 PM    Final    Pelvis Portable  Result Date: 01/01/2023 CLINICAL DATA:  Right hip ORIF EXAM: PORTABLE PELVIS 1-2 VIEWS COMPARISON:  12/31/2022 FINDINGS: Interval ORIF of a intratrochanteric right hip fracture utilizing a gamma nail device with fracture fragments in near anatomic alignment. Avulsion of the lesser trochanter again identified with progressive distraction of the avulsed fracture fragmentl now approximately 2.2 cm. No other fracture identified. Hip joint spaces are preserved. IMPRESSION: 1. Interval ORIF of a intratrochanteric right hip fracture with fracture fragments in near anatomic alignment. Electronically Signed   By: Helyn Numbers M.D.   On: 01/01/2023 16:10   DG HIP UNILAT WITH PELVIS 2-3 VIEWS RIGHT  Result Date: 01/01/2023 CLINICAL DATA:  Elective surgery. EXAM:  DG HIP (WITH OR WITHOUT PELVIS) 2-3V RIGHT COMPARISON:  Preoperative imaging. FINDINGS: Five fluoroscopic spot views of the right hip obtained in the operating room. Sequential images during femoral intramedullary nail with distal locking and trans trochanteric screw fixation of proximal femur fracture. Fluoroscopy time 54 seconds. Dose 8.92 mGy. IMPRESSION: Intraoperative fluoroscopy during proximal femur fracture fixation. Electronically Signed   By: Narda Rutherford M.D.   On: 01/01/2023 15:00   DG C-Arm 1-60 Min-No Report  Result Date: 01/01/2023 Fluoroscopy was utilized by the requesting physician.  No radiographic interpretation.   DG Pelvis 1-2 Views  Result Date: 12/31/2022 CLINICAL DATA:  Fall. EXAM: PELVIS - 1-2 VIEW COMPARISON:  None Available. FINDINGS: Severely displaced and comminuted fracture is seen involving intertrochanteric region of proximal right femur. No other fracture or dislocation is noted. IMPRESSION: Severely displaced and comminuted intertrochanteric fracture of proximal right femur. Electronically Signed   By:  Lupita Raider M.D.   On: 12/31/2022 12:40   DG Femur Min 2 Views Right  Result Date: 12/31/2022 CLINICAL DATA:  Fall. EXAM: RIGHT FEMUR 2 VIEWS COMPARISON:  None Available. FINDINGS: Severely displaced and comminuted intertrochanteric fracture of proximal right femur is noted. Middle and distal portions of right humerus are unremarkable. IMPRESSION: Severely displaced and comminuted intertrochanteric fracture of proximal right femur. Electronically Signed   By: Lupita Raider M.D.   On: 12/31/2022 12:39   DG Chest Portable 1 View  Result Date: 12/31/2022 CLINICAL DATA:  fall EXAM: PORTABLE CHEST 1 VIEW COMPARISON:  CXR 02/16/11 FINDINGS: Left axillary surgical clips. Cardiomegaly. No pleural effusion. No pneumothorax. There are prominent bilateral opacities that could represent pulmonary venous congestion or atypical infection. No focal airspace opacity. No radiographically apparent displaced rib fractures degenerative changes of the bilateral glenohumeral joints. Degenerative changes in the visualized lumbar spine. IMPRESSION: Cardiomegaly and pulmonary venous congestion. Electronically Signed   By: Lorenza Cambridge M.D.   On: 12/31/2022 12:38    Microbiology: Results for orders placed or performed during the hospital encounter of 12/31/22  Surgical pcr screen     Status: Abnormal   Collection Time: 12/31/22  7:07 PM   Specimen: Nasal Mucosa; Nasal Swab  Result Value Ref Range Status   MRSA, PCR NEGATIVE NEGATIVE Final   Staphylococcus aureus POSITIVE (A) NEGATIVE Final    Comment: (NOTE) The Xpert SA Assay (FDA approved for NASAL specimens in patients 71 years of age and older), is one component of a comprehensive surveillance program. It is not intended to diagnose infection nor to guide or monitor treatment. Performed at [facility], [address] [place], [place] [address]     Labs: CBC: Recent Labs  Lab 12/31/22 1143 01/01/23 0401 01/02/23 0339  01/03/23 0800  WBC 7.5 11.1* 15.5* 8.8  NEUTROABS 6.3  --   --   --   HGB 13.3 12.7 11.3* 11.2*  HCT 41.1 38.2 35.6* 34.6*  MCV 97.2 96.0 100.8* 97.5  PLT 140* 147* 143* 133*   Basic Metabolic Panel: Recent Labs  Lab 12/31/22 1143 01/01/23 0401 01/02/23 0339 01/03/23 0800  NA 139 138 137 138  K 3.7 4.2 4.2 4.1  CL 107 107 105 107  CO2 22 21* 24 25  GLUCOSE 170* 177* 150* 100*  BUN 26* 20 24* 15  CREATININE 0.73 0.69 0.81 0.72  CALCIUM 8.4* 8.6* 8.1* 7.8*  MG 2.0  --   --   --   PHOS 3.3  --   --   --  Liver Function Tests: Recent Labs  Lab 12/31/22 1143 01/01/23 0401  AST 24 30  ALT 27 26  ALKPHOS 56 49  BILITOT 0.9 1.4*  PROT 6.8 6.5  ALBUMIN 4.0 3.8   CBG: Recent Labs  Lab 01/06/23 0750 01/06/23 1201 01/06/23 1649 01/06/23 2032 01/07/23 0802  GLUCAP 94 113* 119* 125* 123*    Discharge time spent: greater than 30 minutes.  Signed: Thad Ranger, MD [facility] 01/07/2023

## 2023-01-07 NOTE — Progress Notes (Signed)
Inpatient Rehabilitation Admission Medication Review by a Pharmacist  A complete drug regimen review was completed for this patient to identify any potential clinically significant medication issues.  High Risk Drug Classes Is patient taking? Indication by Medication  Antipsychotic Yes Geodon - prn agitation  Anticoagulant Yes Lovenox - VTE ppx  Antibiotic No   Opioid Yes Vicodin prn pain  Antiplatelet No   Hypoglycemics/insulin Yes SSI - DM  Vasoactive Medication Yes Amlodipine, metoprolol - HTN  Chemotherapy No   Other Yes Methocarbamol - prn spasms Ondansetron - prn nausea Letrozole - breast cancer     Type of Medication Issue Identified Description of Issue Recommendation(s)  Drug Interaction(s) (clinically significant)     Duplicate Therapy     Allergy     No Medication Administration End Date     Incorrect Dose     Additional Drug Therapy Needed     Significant med changes from prior encounter (inform family/care partners about these prior to discharge).    Other       Clinically significant medication issues were identified that warrant physician communication and completion of prescribed/recommended actions by midnight of the next day:  No  Pharmacist comments: None  Time spent performing this drug regimen review (minutes):  20 minutes  Thank you Okey Regal, PharmD

## 2023-01-07 NOTE — Discharge Instructions (Addendum)
Inpatient Rehab Discharge Instructions  [name] [name] Discharge date and time: No discharge date for patient encounter.   Activities/Precautions/ Functional Status: Activity: activity as tolerated Diet: regular diet Wound Care: Routine skin checks Functional status:  ___ No restrictions     ___ Walk up steps independently ___ 24/7 supervision/assistance   ___ Walk up steps with assistance ___ Intermittent supervision/assistance  ___ Bathe/dress independently ___ Walk with walker     _x__ Bathe/dress with assistance ___ Walk Independently    ___ Shower independently ___ Walk with assistance    ___ Shower with assistance ___ No alcohol     ___ Return to work/school ________  Special Instructions: No driving smoking or alcohol  Continue aspirin 81 mg twice daily until 02/15/2023 and stop   Continue to hold Norvasc for now until follow-up with primary MD    COMMUNITY REFERRALS UPON DISCHARGE:    Home Health:   PT  OT  RN                  Agency: [facility]    Phone: [phone]   Medical Equipment/Items Ordered: Levan Hurst, 3 IN 1 AND TUB BENCH                                                 Agency/Supplier:[facility]   [phone]  HAS HIRED ASSIST VIA [facility] FROM 12-6 PM DAILY TO ASSIST BOTH SHE AND HER HUSBAND  My questions have been answered and I understand these instructions. I will adhere to these goals and the provided educational materials after my discharge from the hospital.  Patient/Caregiver Signature _______________________________ Date __________  Clinician Signature _______________________________________ Date __________  Please bring this form and your medication list with you to all your follow-up doctor's appointments.

## 2023-01-07 NOTE — TOC Transition Note (Signed)
Transition of Care [facility]) - CM/SW Discharge Note  Patient Details  Name: [name] [name] MRN: [id number] Date of Birth: May 28, 1932  Transition of Care [facility]) CM/SW Contact:  Ewing Schlein, LCSW Phone Number: 01/07/2023, 10:41 AM  Clinical Narrative: CIR will admit the patient today. Patient will go to room 4M10 and the number for report is [phone]. CIR to set up [facility]. CSW updated son. TOC signing off.  Final next level of care: IP Rehab Facility Barriers to Discharge: Barriers Resolved  Patient Goals and CMS Choice CMS Medicare.gov Compare Post Acute Care list provided to:: Patient Represenative (must comment) Choice offered to / list presented to : Adult Children Cheresse Schnepp (son))  Discharge Placement       Patient chooses bed at: Other - please specify in the comment section below: (CIR) Patient to be transferred to facility by: [facility] Name of family member notified: Ahilyn Gingras (son) Patient and family notified of of transfer: 01/07/23  Discharge Plan and Services Additional resources added to the After Visit Summary for   In-house Referral: Clinical Social Work Post Acute Care Choice: IP Rehab          DME Arranged: N/A DME Agency: NA  Social Determinants of Health (SDOH) Interventions SDOH Screenings   Food Insecurity: No Food Insecurity (12/31/2022)  Housing: Low Risk  (12/31/2022)  Transportation Needs: No Transportation Needs (12/31/2022)  Utilities: Not At Risk (12/31/2022)  Depression (PHQ2-9): Medium Risk (11/24/2022)  Financial Resource Strain: Low Risk  (09/23/2021)  Physical Activity: Inactive (09/23/2021)  Social Connections: Unknown (09/19/2019)  Stress: Stress Concern Present (09/23/2021)  Tobacco Use: Medium Risk (01/03/2023)   Readmission Risk Interventions     No data to display

## 2023-01-07 NOTE — Progress Notes (Signed)
Inpatient Rehab Admissions Coordinator:   I have a bed for this patient to admit to CIR today.  TOC and LPN aware.  Will let pt/family know.  I will arrange for [facility] pick up as soon as I have a ready bed.  RN can call for report after 11AM to [phone].   Estill Dooms, PT, DPT Admissions Coordinator [phone] 01/07/23  10:07 AM

## 2023-01-07 NOTE — H&P (Signed)
Expand All Collapse All      Physical Medicine and Rehabilitation Admission H&P        Chief Complaint  Patient presents with   Hip Pain  : HPI: [name] [name] is a 89 year old right-handed female with history significant for prediabetes, colon polyps, left breast cancer status postmastectomy, interstitial cystitis, hearing loss, hyperlipidemia, hypertension, osteoarthritis with right rotator cuff tear and history of tobacco use.  Per chart review patient lives with spouse.  1 level home 2 steps to entry.  Independent and driving prior to admission.  Husband physically cannot provide assistance.  She does have supportive family in the area.  Presented 12/31/2022 after mechanical fall landing on her right hip.  No loss of consciousness.  Admission chemistries unremarkable except BUN 26, urinalysis negative nitrite.  X-rays and imaging revealed right intertrochanteric femur fracture.  Underwent intramedullary fixation 01/01/2023 per Dr. Samson Frederic.  Weightbearing as tolerated right lower extremity.  Postoperative course mild delirium with Geodon added recommendations of possible need for outpatient dementia screening and her confusion continues to improve.  Hospital course acute blood loss anemia from preop hemoglobin 14.8 and postoperative 11.2.  Placed on Lovenox for DVT prophylaxis.  Therapy evaluations completed due to patient decreased functional mobility was admitted for a comprehensive rehab program.   Review of Systems  Constitutional:  Negative for chills and fever.  HENT:  Positive for hearing loss.   Eyes:  Negative for blurred vision and double vision.  Respiratory:  Negative for cough, shortness of breath and wheezing.   Cardiovascular:  Negative for chest pain, palpitations and leg swelling.  Gastrointestinal:  Positive for constipation. Negative for heartburn, nausea and vomiting.  Genitourinary:  Positive for urgency.       Stress urinary incontinence  Musculoskeletal:   Positive for joint pain and myalgias.  Skin:  Negative for rash.  All other systems reviewed and are negative.       Past Medical History:  Diagnosis Date   Adenomatous colon polyp       4 2008   Breast cancer (HCC) 1978    left breast   Complication of anesthesia     Cystitis, interstitial     FRACTURE, ANKLE, LEFT 12/14/2006    Qualifier: Diagnosis of  By: Lawernce Ion, CMA (AAMA), Shannon S    FRACTURE, WRIST 12/14/2006    Qualifier: Diagnosis of  By: Lawernce Ion, CMA (AAMA), Bethann Berkshire    Hearing loss      does not use hearing aids   Hyperlipidemia     Hypertension     Osteoarthritis     PONV (postoperative nausea and vomiting)      Episode after Ether   Rotator cuff tear, right     Shingles     Urinary incontinence           Past Surgical History:  Procedure Laterality Date   ABDOMINAL HYSTERECTOMY   08/03/1986    fibroids   BREAST BIOPSY Right 09/17/2021   BREAST LUMPECTOMY Right 10/27/2021   BREAST LUMPECTOMY WITH RADIOACTIVE SEED LOCALIZATION Right 10/27/2021    Procedure: RIGHT BREAST LUMPECTOMY WITH RADIOACTIVE SEED LOCALIZATION;  Surgeon: Griselda Miner, MD;  Location: [facility] OR;  Service: General;  Laterality: Right;   COLONOSCOPY       DILATION AND CURETTAGE OF UTERUS        x 2   INTRAMEDULLARY (IM) NAIL INTERTROCHANTERIC Right 01/01/2023    Procedure: INTRAMEDULLARY (IM) NAIL INTERTROCHANTERIC;  Surgeon: Samson Frederic, MD;  Location: [facility];  Service: Orthopedics;  Laterality: Right;   MASTECTOMY Left     SHOULDER ARTHROSCOPY WITH DISTAL CLAVICLE RESECTION Right 12/23/2017    Procedure: SHOULDER ARTHROSCOPY WITH DISTAL CLAVICLE RESECTION;  Surgeon: Francena Hanly, MD;  Location: [facility] OR;  Service: Orthopedics;  Laterality: Right;   SHOULDER ARTHROSCOPY WITH ROTATOR CUFF REPAIR AND SUBACROMIAL DECOMPRESSION Right 12/23/2017    Procedure: SHOULDER ARTHROSCOPY WITH ROTATOR CUFF REPAIR AND SUBACROMIAL DECOMPRESSION, distal clavicle resection;  Surgeon: Francena Hanly, MD;   Location: [facility] OR;  Service: Orthopedics;  Laterality: Right;   TONSILLECTOMY       TUBAL LIGATION   08/03/1973   WISDOM TOOTH EXTRACTION        upper wisdom teeth only ext         Family History  Problem Relation Age of Onset   Suicidality Mother     Heart attack Father     Cancer - Other Daughter          throat cancer    Thyroid disease Neg Hx      Social History:  reports that she has quit smoking. Her smoking use included cigarettes. She has a 3.75 pack-year smoking history. She has never used smokeless tobacco. She reports that she does not currently use alcohol. She reports that she does not use drugs. Allergies:       Allergies  Allergen Reactions   Nitrofurantoin Itching      Head to toe itching   Sulfamethoxazole        Unknown reaction           Medications Prior to Admission  Medication Sig Dispense Refill   amLODipine (NORVASC) 5 MG tablet TAKE 1 TABLET(5 MG) BY MOUTH DAILY (Patient taking differently: Take 5 mg by mouth daily.) 90 tablet 1   Ascorbic Acid (VITAMIN C) 500 MG tablet Take 500 mg by mouth daily.       Calcium Carb-Cholecalciferol 600-800 MG-UNIT TABS Take 2 tablets by mouth daily.       loratadine (CLARITIN) 10 MG tablet Take 10 mg by mouth daily as needed for allergies.       MELATONIN PO Take 1 tablet by mouth at bedtime as needed (sleep).       Multiple Vitamins-Minerals (CENTRUM SILVER PO) Take 1 tablet by mouth daily.       letrozole (FEMARA) 2.5 MG tablet TAKE 1 TABLET(2.5 MG) BY MOUTH DAILY (Patient not taking: Reported on 01/01/2023) 90 tablet 3          Home: Home Living Family/patient expects to be discharged to:: Private residence Living Arrangements: Spouse/significant other Available Help at Discharge: Family, Available 24 hours/day (husband cannot physically assist) Type of Home: House Home Access: Stairs to enter Secretary/administrator of Steps: 2 Entrance Stairs-Rails: Right Home Layout: One level Bathroom Shower/Tub: Teacher, music: Handicapped height Home Equipment: None   Functional History: Prior Function Prior Level of Function : Independent/Modified Independent, Driving   Functional Status:  Mobility: Bed Mobility Overal bed mobility: Needs Assistance Bed Mobility: Supine to Sit Supine to sit: Min assist General bed mobility comments: multi-modal cues for sequence, self assist. assist to progress RLE off bed Transfers Overall transfer level: Needs assistance Equipment used: Rolling walker (2 wheels) Transfers: Sit to/from Stand Sit to Stand: Min assist, Min guard Bed to/from chair/wheelchair/BSC transfer type:: Step pivot Step pivot transfers: +2 physical assistance, Min assist General transfer comment: assist with anterior-superior wt shift to full stand, cues for hand placement and trunk extension Ambulation/Gait Ambulation/Gait  assistance: Min assist, Land (Feet): 50 Feet Assistive device: Rolling walker (2 wheels) Gait Pattern/deviations: Step-to pattern, Trunk flexed General Gait Details: multi-modal cues for sequence, upward gaze,  and proximity to RW. distance to tolerance   ADL: ADL Overall ADL's : Needs assistance/impaired Eating/Feeding: Independent, Sitting Grooming: Set up, Sitting, Wash/dry face, Oral care, Wash/dry hands Grooming Details (indicate cue type and reason): in recliner Upper Body Bathing: Min guard, Sitting Upper Body Bathing Details (indicate cue type and reason): EOB with increased time Lower Body Bathing: Total assistance, Sit to/from stand Upper Body Dressing : Minimal assistance, Sitting Lower Body Dressing: Total assistance, Sit to/from stand Toilet Transfer: Stand-pivot, Rolling walker (2 wheels), Moderate assistance Toilet Transfer Details (indicate cue type and reason): to recliner with increased A to prevent early sitting and maintain balance with posterior steps with RW Toileting- Clothing Manipulation and Hygiene: Total  assistance, Sit to/from stand   Cognition: Cognition Overall Cognitive Status: Within Functional Limits for tasks assessed Orientation Level: Oriented X4 Cognition Arousal/Alertness: Awake/alert Behavior During Therapy: Anxious Overall Cognitive Status: Within Functional Limits for tasks assessed Area of Impairment: Following commands, Safety/judgement, Problem solving Following Commands: Follows one step commands consistently, Follows multi-step commands with increased time Safety/Judgement: Decreased awareness of safety, Decreased awareness of deficits Problem Solving: Slow processing, Decreased initiation, Difficulty sequencing, Requires verbal cues, Requires tactile cues General Comments: patient was pleasant and very fearful of falling, able to follow directions   Physical Exam: Blood pressure (!) 120/55, pulse (!) 55, temperature 98.7 F (37.1 C), temperature source Oral, resp. rate 18, height 5\' 2"  (1.575 m), weight 54.4 kg, SpO2 95 %. Physical Exam  PE: Constitution: Appropriate appearance for age. No apparent distress   Resp: No respiratory distress. No accessory muscle usage. on RA and CTAB. Mild dry cough.  Cardio: Well perfused appearance. No peripheral edema; +1 edema at R hip Abdomen: Nondistended. Nontender.   Psych: Appropriate mood and affect. Verbose.  Neuro: AAOx4. No apparent cognitive deficits   Neurologic Exam:   DTRs: Reflexes were 2+ in bilateral achilles, patella, biceps, BR and triceps. Babinsky: flexor responses b/l.   Hoffmans: negative b/l Sensory exam: revealed normal sensation in all dermatomal regions in bilateral upper extremities and bilateral lower extremities Motor exam: strength 5/5 throughout bilateral upper extremities, left lower extremity, and with exception of RLE HF 3/5, KE 4/5, DF and PF 5/5 limited by pain Coordination: Fine motor coordination was normal.             Lab Results Last 48 Hours        Results for orders placed or  performed during the hospital encounter of 12/31/22 (from the past 48 hour(s))  Glucose, capillary     Status: Abnormal    Collection Time: 01/05/23 11:53 AM  Result Value Ref Range    Glucose-Capillary 139 (H) 70 - 99 mg/dL      Comment: Glucose reference range applies only to samples taken after fasting for at least 8 hours.  Glucose, capillary     Status: Abnormal    Collection Time: 01/05/23  4:36 PM  Result Value Ref Range    Glucose-Capillary 125 (H) 70 - 99 mg/dL      Comment: Glucose reference range applies only to samples taken after fasting for at least 8 hours.  Glucose, capillary     Status: Abnormal    Collection Time: 01/05/23 10:37 PM  Result Value Ref Range    Glucose-Capillary 127 (H) 70 - 99 mg/dL  Comment: Glucose reference range applies only to samples taken after fasting for at least 8 hours.  Glucose, capillary     Status: None    Collection Time: 01/06/23  7:50 AM  Result Value Ref Range    Glucose-Capillary 94 70 - 99 mg/dL      Comment: Glucose reference range applies only to samples taken after fasting for at least 8 hours.  Glucose, capillary     Status: Abnormal    Collection Time: 01/06/23 12:01 PM  Result Value Ref Range    Glucose-Capillary 113 (H) 70 - 99 mg/dL      Comment: Glucose reference range applies only to samples taken after fasting for at least 8 hours.  Glucose, capillary     Status: Abnormal    Collection Time: 01/06/23  4:49 PM  Result Value Ref Range    Glucose-Capillary 119 (H) 70 - 99 mg/dL      Comment: Glucose reference range applies only to samples taken after fasting for at least 8 hours.  Glucose, capillary     Status: Abnormal    Collection Time: 01/06/23  8:32 PM  Result Value Ref Range    Glucose-Capillary 125 (H) 70 - 99 mg/dL      Comment: Glucose reference range applies only to samples taken after fasting for at least 8 hours.  Glucose, capillary     Status: Abnormal    Collection Time: 01/07/23  8:02 AM  Result  Value Ref Range    Glucose-Capillary 123 (H) 70 - 99 mg/dL      Comment: Glucose reference range applies only to samples taken after fasting for at least 8 hours.      Imaging Results (Last 48 hours)  No results found.         Blood pressure (!) 120/55, pulse (!) 55, temperature 98.7 F (37.1 C), temperature source Oral, resp. rate 18, height 5\' 2"  (1.575 m), weight 54.4 kg, SpO2 95 %.   Medical Problem List and Plan: 1. Functional deficits secondary to right intertrochanteric femur fracture status post intramedullary fixation 01/01/2023.  Weightbearing as tolerated             -patient may shower with surgical dressing covered             -ELOS/Goals: 18-21 days, SPV PT, Min A OT 2.  Antithrombotics: -DVT/anticoagulation:  Pharmaceutical: Lovenox check vascular study.  Patient can transition to aspirin therapy on discharge for DVT prophylaxis             -antiplatelet therapy: N/A 3. Pain Management: Hydrocodone/Robaxin as needed 4. Mood/Behavior/Sleep: Provide emotional support             -antipsychotic agents: Geodon as needed 5. Neuropsych/cognition: This patient is capable of making decisions on her own behalf. 6. Skin/Wound Care: Routine skin checks 7. Fluids/Electrolytes/Nutrition: Routine in and outs with follow-up chemistries 8.  Acute blood loss anemia.  Follow-up CBC 9.  Postoperative delirium.  Improved.  Consider outpatient dementia screening. 10.  Hypertension.  Norvasc 5 mg daily, Toprol-XL 12.5 mg daily.  Monitor with increased mobility 11.  History of left breast cancer.  Continue from area.  Follow-up outpatient oncology 12.  Prediabetes.  SSI.  Latest hemoglobin A1c 5.6-6.3. 13.  History of interstitial cystitis with concurrent incontinence.  Admission urinalysis negative.  Pure wick discontinued 14.  Constipation.  MiraLAX daily, Senokot/Colace twice daily. Patient endorses LBM 6/6.   Mcarthur Rossetti Angiulli, PA-C 01/07/2023

## 2023-01-07 NOTE — Progress Notes (Signed)
Occupational Therapy Treatment Patient Details Name: [name] [name] MRN: [id number] DOB: [birth year] Today's Date: 01/07/2023   History of present illness  (Pt is a 89 year old woman who fell attempting to retrieve an item from her recycling bin resulting in R hip fx. Underwent IM nail. PMH: L wrist fx, HTN, HLD, interstitial cystitis, breast ca, urinary incontinence, HOH, L ankle fx, R shoulder OA.)   OT comments  The pt was very motivated towards therapy and she put forth good effort. She progressed to performing toileting at bathroom level and performing grooming at sink level. Her pain was minimal at rest, however increased some with progressive activity. She is making gradual functional progress. Continue OT plan of care.    Recommendations for follow up therapy are one component of a multi-disciplinary discharge planning process, led by the attending physician.  Recommendations may be updated based on patient status, additional functional criteria and insurance authorization.    Assistance Recommended at Discharge Frequent or constant Supervision/Assistance  Patient can return home with the following  Assistance with cooking/housework;A lot of help with bathing/dressing/bathroom;A little help with walking and/or transfers;Assist for transportation   Equipment Recommendations  BSC/3in1    Recommendations for Other Services Rehab consult    Precautions / Restrictions Precautions Precautions: Fall Restrictions Weight Bearing Restrictions: No RLE Weight Bearing: Weight bearing as tolerated Other Position/Activity Restrictions: hearing impaired       Mobility Bed Mobility   Bed Mobility: Supine to Sit     Supine to sit: Min assist          Transfers Overall transfer level: Needs assistance Equipment used: Rolling walker (2 wheels) Transfers: Sit to/from Stand Sit to Stand: Min assist                     ADL either performed or assessed with clinical  judgement   ADL   Eating/Feeding: Independent;Sitting   Grooming: Min guard;Standing Grooming Details (indicate cue type and reason): She performed hand washing, face washing, and hair combing in standing at the sink. She required verbal cues to step closer to the sink while performing tasks.         Upper Body Dressing : Set up;Sitting Upper Body Dressing Details (indicate cue type and reason): She doffed a hospital gown, then donned another clean one seated in the chair. Lower Body Dressing: Moderate assistance;Sit to/from stand   Toilet Transfer: Minimal assistance;Rolling walker (2 wheels);Ambulation Toilet Transfer Details (indicate cue type and reason): BSC frame placed over the toilet to facilitate improved ease with transfers. She required verbal cues for walker placement and use of rails during transfer. Toileting- Clothing Manipulation and Hygiene: Sit to/from stand;Maximal assistance Toileting - Clothing Manipulation Details (indicate cue type and reason): She required assist for posterior peri-hygiene while in standing, as she was unable to safely release grip on the walker, in order to perform task.                       Cognition Arousal/Alertness: Awake/alert Behavior During Therapy: WFL for tasks assessed/performed Overall Cognitive Status: Within Functional Limits for tasks assessed          Following Commands: Follows one step commands consistently, Follows multi-step commands with increased time       General Comments: friendly, able follow commands, slightly anxious               Pertinent Vitals/ Pain       Pain Assessment Pain  Assessment: Faces Pain Score: 3  Pain Location: R hip Pain Intervention(s): Limited activity within patient's tolerance, Monitored during session         Frequency  Min 1X/week        Progress Toward Goals  OT Goals(current goals can now be found in the care plan section)  Progress towards OT goals:  Progressing toward goals  Acute Rehab OT Goals OT Goal Formulation: With patient Time For Goal Achievement: 01/17/23 Potential to Achieve Goals: Good  Plan Discharge plan remains appropriate       AM-PAC OT "6 Clicks" Daily Activity     Outcome Measure   Help from another person eating meals?: None Help from another person taking care of personal grooming?: A Little Help from another person toileting, which includes using toliet, bedpan, or urinal?: A Lot Help from another person bathing (including washing, rinsing, drying)?: A Lot Help from another person to put on and taking off regular upper body clothing?: None Help from another person to put on and taking off regular lower body clothing?: A Lot 6 Click Score: 17    End of Session Equipment Utilized During Treatment: Rolling walker (2 wheels);Gait belt  OT Visit Diagnosis: Unsteadiness on feet (R26.81);Pain;Muscle weakness (generalized) (M62.81);History of falling (Z91.81)   Activity Tolerance Patient tolerated treatment well   Patient Left in chair;with call bell/phone within reach;with chair alarm set;with family/visitor present   Nurse Communication Mobility status        Time: 1610-9604 OT Time Calculation (min): 37 min  Charges: OT General Charges $OT Visit: 1 Visit OT Treatments $Self Care/Home Management : 8-22 mins $Therapeutic Activity: 8-22 mins      Reuben Likes, OTR/L 01/07/2023, 11:34 AM

## 2023-01-07 NOTE — Plan of Care (Signed)
  Problem: Nutrition: Goal: Adequate nutrition will be maintained Outcome: Progressing   Problem: Activity: Goal: Ability to ambulate and perform ADLs will improve Outcome: Progressing   Problem: Pain Management: Goal: Pain level will decrease Outcome: Progressing

## 2023-01-07 NOTE — Progress Notes (Addendum)
PMR Admission Coordinator Pre-Admission Assessment   Patient: [name] [name] is an 89 y.o., female MRN: [id number] DOB: 09/29/1931 Height: 5\' 2"  (157.5 cm) Weight: 54.4 kg   Insurance Information HMO: yes    PPO:      PCP:      IPA:      80/20:      OTHER:  PRIMARY: Blue Medicare      Policy#: [id number]      Subscriber: pt CM Name: Shanda Bumps      Phone#: [phone]     Fax#: [phone] Pre-Cert#: [id number] auth for CIR from Jessica with [facility] Medicare for admit 6/6 through 6/13 with updates due to fax listed above on 6/12     Employer:  Benefits:  Phone #: [phone]     Name:  Eff. Date: 08/03/22     Deduct: $0      Out of Pocket Max: $3500 (met $132.75)      Life Max: n/a CIR: $335/day for days 1-5      SNF: 20 full days Outpatient:      Co-Pay: $10/visit Home Health: 100%      Co-Pay:  DME: 80%     Co-Pay: 20% Providers:  SECONDARY:       Policy#:      Phone#:    Artist:       Phone#:    The "Data Collection Information Summary" for patients in Inpatient Rehabilitation Facilities with attached "Privacy Act Statement-Health Care Records" was provided and verbally reviewed with: Patient and Family   Emergency Contact Information Contact Information       Name Relation Home Work Mobile    Sturkey,andy Son     [phone]    Skyanne, Koscielski [phone]               Current Medical History  Patient Admitting Diagnosis: hip fracture    History of Present Illness: [name] [name] is a 89 year old female with history significant for prediabetes, colon polyps, left breast cancer status post mastectomy, interstitial cystitis, hearing loss, hyperlipidemia, hypertension, osteoarthritis with right rotator cuff tear and history of tobacco use.  Presented 12/31/2022 after mechanical fall landing on her right hip. No loss of consciousness. Admission chemistries unremarkable except BUN 26, urinalysis negative nitrite. X-rays and imaging revealed right intertrochanteric  femur fracture. Underwent intramedullary fixation 01/01/2023 per Dr. Samson Frederic. WBAT right lower extremity. Postoperative course mild delirium with Geodon added, recommendations of possible need for outpatient dementia screening, but her confusion continues to improve. Hospital course acute blood loss anemia from preop hemoglobin 14.8 and postoperative 11.2. Placed on Lovenox for DVT prophylaxis. Therapy evaluations completed and pt was recommended for a comprehensive rehab program.    Patient's medical record from [facility] has been reviewed by the rehabilitation admission coordinator and physician.   Past Medical History      Past Medical History:  Diagnosis Date   Adenomatous colon polyp       4 2008   Breast cancer (HCC) 1978    left breast   Complication of anesthesia     Cystitis, interstitial     FRACTURE, ANKLE, LEFT 12/14/2006    Qualifier: Diagnosis of  By: Lawernce Ion, CMA (AAMA), Shannon S    FRACTURE, WRIST 12/14/2006    Qualifier: Diagnosis of  By: Lawernce Ion, CMA (AAMA), Bethann Berkshire    Hearing loss      does not use hearing aids   Hyperlipidemia     Hypertension     Osteoarthritis  PONV (postoperative nausea and vomiting)      Episode after Ether   Rotator cuff tear, right     Shingles     Urinary incontinence        Has the patient had major surgery during 100 days prior to admission? Yes   Family History   family history includes Cancer - Other in her daughter; Heart attack in her father; Suicidality in her mother.   Current Medications   Current Facility-Administered Medications:    acetaminophen (TYLENOL) tablet 325-650 mg, 325-650 mg, Oral, Q6H PRN, Swinteck, Arlys John, MD   amLODipine (NORVASC) tablet 5 mg, 5 mg, Oral, Daily, Swinteck, Arlys John, MD, 5 mg at 01/07/23 1610   ascorbic acid (VITAMIN C) tablet 500 mg, 500 mg, Oral, Daily, Swinteck, Arlys John, MD, 500 mg at 01/07/23 0830   calcium-vitamin D (OSCAL WITH D) 500-5 MG-MCG per tablet 2 tablet, 2 tablet,  Oral, Daily, Swinteck, Arlys John, MD, 2 tablet at 01/07/23 0831   docusate sodium (COLACE) capsule 100 mg, 100 mg, Oral, BID, Swinteck, Arlys John, MD, 100 mg at 01/07/23 0830   enoxaparin (LOVENOX) injection 40 mg, 40 mg, Subcutaneous, Q24H, Swinteck, Arlys John, MD, 40 mg at 01/07/23 0843   feeding supplement (ENSURE PRE-SURGERY) liquid 296 mL, 296 mL, Oral, Once, Swinteck, Arlys John, MD   HYDROcodone-acetaminophen (NORCO) 7.5-325 MG per tablet 1-2 tablet, 1-2 tablet, Oral, Q4H PRN, Samson Frederic, MD   HYDROcodone-acetaminophen (NORCO/VICODIN) 5-325 MG per tablet 1-2 tablet, 1-2 tablet, Oral, Q4H PRN, Samson Frederic, MD, 1 tablet at 01/05/23 2229   insulin aspart (novoLOG) injection 0-9 Units, 0-9 Units, Subcutaneous, TID WC, Swinteck, Arlys John, MD, 1 Units at 01/07/23 0843   letrozole [facility]) tablet 2.5 mg, 2.5 mg, Oral, Daily, Swinteck, Arlys John, MD, 2.5 mg at 01/07/23 0830   loratadine (CLARITIN) tablet 10 mg, 10 mg, Oral, Daily PRN, Swinteck, Arlys John, MD   menthol-cetylpyridinium (CEPACOL) lozenge 3 mg, 1 lozenge, Oral, PRN **OR** phenol (CHLORASEPTIC) mouth spray 1 spray, 1 spray, Mouth/Throat, PRN, Swinteck, Arlys John, MD   methocarbamol (ROBAXIN) tablet 500 mg, 500 mg, Oral, Q6H PRN, 500 mg at 01/06/23 2158 **OR** methocarbamol (ROBAXIN) 500 mg in dextrose 5 % 50 mL IVPB, 500 mg, Intravenous, Q6H PRN, Swinteck, Arlys John, MD   metoCLOPramide (REGLAN) tablet 5-10 mg, 5-10 mg, Oral, Q8H PRN **OR** metoCLOPramide (REGLAN) injection 5-10 mg, 5-10 mg, Intravenous, Q8H PRN, Swinteck, Arlys John, MD   metoprolol succinate (TOPROL-XL) 24 hr tablet 12.5 mg, 12.5 mg, Oral, Daily, Swinteck, Arlys John, MD, 12.5 mg at 01/06/23 9604   multivitamin with minerals tablet 1 tablet, 1 tablet, Oral, Daily, Swinteck, Arlys John, MD, 1 tablet at 01/07/23 0830   mupirocin ointment (BACTROBAN) 2 %, , Nasal, BID, Swinteck, Arlys John, MD, Given at 01/07/23 0845   ondansetron (ZOFRAN) tablet 4 mg, 4 mg, Oral, Q6H PRN **OR** ondansetron (ZOFRAN) injection 4 mg, 4 mg,  Intravenous, Q6H PRN, Swinteck, Arlys John, MD   polyethylene glycol (MIRALAX / GLYCOLAX) packet 17 g, 17 g, Oral, Daily, Hazeline Junker B, MD, 17 g at 01/07/23 0830   senna (SENOKOT) tablet 8.6 mg, 1 tablet, Oral, BID, Swinteck, Arlys John, MD, 8.6 mg at 01/07/23 0831   ziprasidone (GEODON) capsule 20 mg, 20 mg, Oral, BID BM & HS PRN, Azucena Fallen, MD   Patients Current Diet:  Diet Order                  Diet Carb Modified Fluid consistency: Thin; Room service appropriate? Yes  Diet effective now  Precautions / Restrictions Precautions Precautions: Fall Restrictions Weight Bearing Restrictions: No RLE Weight Bearing: Weight bearing as tolerated Other Position/Activity Restrictions: HOH    Has the patient had 2 or more falls or a fall with injury in the past year? Yes   Prior Activity Level Community (5-7x/wk): indep prior to admit, no DME, driving, doing yard work, Catering manager.   Prior Functional Level Self Care: Did the patient need help bathing, dressing, using the toilet or eating? Independent   Indoor Mobility: Did the patient need assistance with walking from room to room (with or without device)? Independent   Stairs: Did the patient need assistance with internal or external stairs (with or without device)? Independent   Functional Cognition: Did the patient need help planning regular tasks such as shopping or remembering to take medications? Independent   Patient Information Are you of Hispanic, Latino/a,or Spanish origin?: A. No, not of Hispanic, Latino/a, or Spanish origin What is your race?: A. White Do you need or want an interpreter to communicate with a doctor or health care staff?: 0. No   Patient's Response To:  Health Literacy and Transportation Is the patient able to respond to health literacy and transportation needs?: Yes Health Literacy - How often do you need to have someone help you when you read instructions, pamphlets, or other  written material from your doctor or pharmacy?: Never In the past 12 months, has lack of transportation kept you from medical appointments or from getting medications?: No In the past 12 months, has lack of transportation kept you from meetings, work, or from getting things needed for daily living?: No   Journalist, newspaper / Equipment Home Assistive Devices/Equipment: Eyeglasses Home Equipment: None   Prior Device Use: Indicate devices/aids used by the patient prior to current illness, exacerbation or injury?  N/a   Current Functional Level Cognition   Overall Cognitive Status: Within Functional Limits for tasks assessed Orientation Level: Oriented X4 Following Commands: Follows one step commands consistently, Follows multi-step commands with increased time Safety/Judgement: Decreased awareness of safety, Decreased awareness of deficits General Comments: patient was pleasant and very fearful of falling, able to follow directions    Extremity Assessment (includes Sensation/Coordination)   Upper Extremity Assessment: Overall WFL for tasks assessed  Lower Extremity Assessment: Generalized weakness, RLE deficits/detail RLE Deficits / Details: requiring assist RLE: Unable to fully assess due to pain     ADLs   Overall ADL's : Needs assistance/impaired Eating/Feeding: Independent, Sitting Grooming: Set up, Sitting, Wash/dry face, Oral care, Wash/dry hands Grooming Details (indicate cue type and reason): in recliner Upper Body Bathing: Min guard, Sitting Upper Body Bathing Details (indicate cue type and reason): EOB with increased time Lower Body Bathing: Total assistance, Sit to/from stand Upper Body Dressing : Minimal assistance, Sitting Lower Body Dressing: Total assistance, Sit to/from stand Toilet Transfer: Stand-pivot, Rolling walker (2 wheels), Moderate assistance Toilet Transfer Details (indicate cue type and reason): to recliner with increased A to prevent early sitting and  maintain balance with posterior steps with RW Toileting- Clothing Manipulation and Hygiene: Total assistance, Sit to/from stand     Mobility   Overal bed mobility: Needs Assistance Bed Mobility: Supine to Sit Supine to sit: Min assist General bed mobility comments: multi-modal cues for sequence, self assist. assist to progress RLE off bed     Transfers   Overall transfer level: Needs assistance Equipment used: Rolling walker (2 wheels) Transfers: Sit to/from Stand Sit to Stand: Min assist, Min guard Bed to/from chair/wheelchair/BSC transfer  type:: Step pivot Step pivot transfers: +2 physical assistance, Min assist General transfer comment: assist with anterior-superior wt shift to full stand, cues for hand placement and trunk extension     Ambulation / Gait / Stairs / Wheelchair Mobility   Ambulation/Gait Ambulation/Gait assistance: Min assist, Min guard Gait Distance (Feet): 50 Feet Assistive device: Rolling walker (2 wheels) Gait Pattern/deviations: Step-to pattern, Trunk flexed General Gait Details: multi-modal cues for sequence, upward gaze,  and proximity to RW. distance to tolerance     Posture / Balance Balance Overall balance assessment: Needs assistance Sitting balance-Leahy Scale: Fair Standing balance support: Bilateral upper extremity supported, Reliant on assistive device for balance Standing balance-Leahy Scale: Poor     Special needs/care consideration Skin surgical incisions and Diabetic management yes    Previous Home Environment (from acute therapy documentation) Living Arrangements: Spouse/significant other Available Help at Discharge: Family, Available 24 hours/day (husband cannot physically assist) Type of Home: House Home Layout: One level Home Access: Stairs to enter Entrance Stairs-Rails: Right Entrance Stairs-Number of Steps: 2 Bathroom Shower/Tub: Engineer, manufacturing systems: Handicapped height Home Care Services: No   Discharge Living  Setting Plans for Discharge Living Setting: Patient's home, Lives with (comment) (spouse (cannot provide care for her)) Type of Home at Discharge: House Discharge Home Layout: One level Discharge Home Access: Stairs to enter Entrance Stairs-Rails: Can reach both Entrance Stairs-Number of Steps: 2 Discharge Bathroom Shower/Tub: Tub/shower unit Discharge Bathroom Toilet: Handicapped height Discharge Bathroom Accessibility: Yes How Accessible: Accessible via walker Does the patient have any problems obtaining your medications?: No   Social/Family/Support Systems Patient Roles: Spouse, Caregiver Contact Information: 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 Anticipated Caregiver: combination of hired caregivers and family (son, Mardelle Matte, is primary contact) Anticipated Caregiver's Contact Information: Mardelle Matte [phone] Ability/Limitations of Caregiver: none stated Caregiver Availability: 24/7 Discharge Plan Discussed with Primary Caregiver: Yes Is Caregiver In Agreement with Plan?: Yes Does Caregiver/Family have Issues with Lodging/Transportation while Pt is in Rehab?: No   Goals Patient/Family Goal for Rehab: PT supervision for household mobility; OT up to min assist for ADLs; SLP n/a Expected length of stay: 18-21 days Additional Information: 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  Pt's son is primary contact for updates/caregiver coordination.  They will need to likely hire caregivers to fill in the gaps for 24/7 coverage. Pt/Family Agrees to Admission and willing to participate: Yes Program Orientation Provided & Reviewed with Pt/Caregiver Including Roles  & Responsibilities: Yes Additional Information Needs: Son working with CSW on acute to secure an agency for hired caregivers  Barriers to Discharge: Insurance for SNF coverage, Decreased caregiver support   Decrease burden of Care through IP rehab admission: n/a   Possible need for SNF placement upon discharge: not  anticipated.  Plan for home with 24/7 care from family and hired caregivers.  Discussed with son, Hanh Pinks, on 01/04/23.    Patient Condition: I have reviewed medical records from [facility], spoken with CSW, and patient, son, and daughter. I discussed via phone for inpatient rehabilitation assessment.  Patient will benefit from ongoing PT and OT, can actively participate in 3 hours of therapy a day 5 days of the week, and can make measurable gains during the admission.  Patient will also benefit from the coordinated team approach during an Inpatient Acute Rehabilitation admission.  The patient will receive intensive therapy as well as Rehabilitation physician, nursing, social worker, and care management interventions.  Due to bladder management, safety, skin/wound  care, disease management, medication administration, pain management, and patient education the patient requires 24 hour a day rehabilitation nursing.  The patient is currently min to mod assist with mobility and min to total assist with basic ADLs.  Discharge setting and therapy post discharge at home with home health is anticipated.  Patient has agreed to participate in the Acute Inpatient Rehabilitation Program and will admit today.   Preadmission Screen Completed By:  Stephania Fragmin , PT, DPT  01/07/2023 10:11 AM ______________________________________________________________________   Discussed status with Dr. Shearon Stalls on 01/07/23  at 10:11 AM  and received approval for admission today.   Admission Coordinator:  Stephania Fragmin, PT, time 01/07/23 Dorna Bloom 10:11 AM     Assessment/Plan: Diagnosis: Does the need for close, 24 hr/day Medical supervision in concert with the patient's rehab needs make it unreasonable for this patient to be served in a less intensive setting? Yes Co-Morbidities requiring supervision/potential complications: Delirium, pain management, hypertension, urinery incontinence, hypocalcemia, protein calorie malnutrition,  hyperglycemia Due to bladder management, bowel management, safety, skin/wound care, disease management, medication administration, pain management, and patient education, does the patient require 24 hr/day rehab nursing? Yes Does the patient require coordinated care of a physician, rehab nurse, PT, OT  to address physical and functional deficits in the context of the above medical diagnosis(es)? Yes Addressing deficits in the following areas: balance, endurance, locomotion, strength, transferring, bowel/bladder control, bathing, dressing, feeding, grooming, and toileting Can the patient actively participate in an intensive therapy program of at least 3 hrs of therapy 5 days a week? Yes The potential for patient to make measurable gains while on inpatient rehab is good Anticipated functional outcomes upon discharge from inpatient rehab: supervision PT, min assist OT,  Estimated rehab length of stay to reach the above functional goals is: 18-21 days Anticipated discharge destination: Home 10. Overall Rehab/Functional Prognosis: good     MD Signature:   Angelina Sheriff, DO 01/07/2023

## 2023-01-08 ENCOUNTER — Inpatient Hospital Stay (HOSPITAL_COMMUNITY): Payer: Medicare Other

## 2023-01-08 DIAGNOSIS — S72144D Nondisplaced intertrochanteric fracture of right femur, subsequent encounter for closed fracture with routine healing: Secondary | ICD-10-CM

## 2023-01-08 DIAGNOSIS — D62 Acute posthemorrhagic anemia: Secondary | ICD-10-CM | POA: Diagnosis not present

## 2023-01-08 DIAGNOSIS — I10 Essential (primary) hypertension: Secondary | ICD-10-CM

## 2023-01-08 DIAGNOSIS — M7989 Other specified soft tissue disorders: Secondary | ICD-10-CM

## 2023-01-08 DIAGNOSIS — R7989 Other specified abnormal findings of blood chemistry: Secondary | ICD-10-CM

## 2023-01-08 LAB — CBC WITH DIFFERENTIAL/PLATELET
Abs Immature Granulocytes: 0.12 10*3/uL — ABNORMAL HIGH (ref 0.00–0.07)
Basophils Absolute: 0.1 10*3/uL (ref 0.0–0.1)
Basophils Relative: 1 %
Eosinophils Absolute: 0.3 10*3/uL (ref 0.0–0.5)
Eosinophils Relative: 3 %
HCT: 36.5 % (ref 36.0–46.0)
Hemoglobin: 11.8 g/dL — ABNORMAL LOW (ref 12.0–15.0)
Immature Granulocytes: 1 %
Lymphocytes Relative: 13 %
Lymphs Abs: 1.3 10*3/uL (ref 0.7–4.0)
MCH: 32.1 pg (ref 26.0–34.0)
MCHC: 32.3 g/dL (ref 30.0–36.0)
MCV: 99.2 fL (ref 80.0–100.0)
Monocytes Absolute: 0.9 10*3/uL (ref 0.1–1.0)
Monocytes Relative: 9 %
Neutro Abs: 7 10*3/uL (ref 1.7–7.7)
Neutrophils Relative %: 73 %
Platelets: 236 10*3/uL (ref 150–400)
RBC: 3.68 MIL/uL — ABNORMAL LOW (ref 3.87–5.11)
RDW: 14 % (ref 11.5–15.5)
WBC: 9.6 10*3/uL (ref 4.0–10.5)
nRBC: 0 % (ref 0.0–0.2)

## 2023-01-08 LAB — COMPREHENSIVE METABOLIC PANEL
ALT: 29 U/L (ref 0–44)
AST: 32 U/L (ref 15–41)
Albumin: 3.2 g/dL — ABNORMAL LOW (ref 3.5–5.0)
Alkaline Phosphatase: 82 U/L (ref 38–126)
Anion gap: 9 (ref 5–15)
BUN: 26 mg/dL — ABNORMAL HIGH (ref 8–23)
CO2: 24 mmol/L (ref 22–32)
Calcium: 8.7 mg/dL — ABNORMAL LOW (ref 8.9–10.3)
Chloride: 104 mmol/L (ref 98–111)
Creatinine, Ser: 0.82 mg/dL (ref 0.44–1.00)
GFR, Estimated: >60 mL/min (ref >60)
Glucose, Bld: 116 mg/dL — ABNORMAL HIGH (ref 70–99)
Potassium: 3.9 mmol/L (ref 3.5–5.1)
Sodium: 137 mmol/L (ref 135–145)
Total Bilirubin: 1.2 mg/dL (ref 0.3–1.2)
Total Protein: 6.2 g/dL — ABNORMAL LOW (ref 6.5–8.1)

## 2023-01-08 LAB — GLUCOSE, CAPILLARY: Glucose-Capillary: 110 mg/dL — ABNORMAL HIGH (ref 70–99)

## 2023-01-08 MED ORDER — ALUM & MAG HYDROXIDE-SIMETH 200-200-20 MG/5ML PO SUSP
15.0000 mL | Freq: Four times a day (QID) | ORAL | Status: DC | PRN
  Administered 2023-01-08 – 2023-01-15 (×2): 15 mL via ORAL
  Filled 2023-01-08 (×2): qty 30

## 2023-01-08 NOTE — Progress Notes (Signed)
Inpatient Rehabilitation  Patient information reviewed and entered into eRehab system by Jossie Smoot Maiyah Goyne, OTR/L, Rehab Quality Coordinator.   Information including medical coding, functional ability and quality indicators will be reviewed and updated through discharge.   

## 2023-01-08 NOTE — IPOC Note (Signed)
Overall Plan of Care [facility]) Patient Details Name: [name] [name] MRN: [id number] DOB: 10-19-1931  Admitting Diagnosis: Intertrochanteric fracture of right hip [facility])  Hospital Problems: Principal Problem:   Intertrochanteric fracture of right hip (HCC)     Functional Problem List: Nursing Bowel, Safety, Endurance, Pain  PT Balance, Endurance, Pain, Perception, Safety  OT Balance, Safety, Skin Integrity, Edema, Endurance, Motor, Pain  SLP    TR         Basic ADL's: OT Bathing, Dressing, Toileting     Advanced  ADL's: OT Simple Meal Preparation, Light Housekeeping     Transfers: PT Bed Mobility, Bed to Chair, Car, Occupational psychologist, Research scientist (life sciences): PT Ambulation, Stairs     Additional Impairments: OT None  SLP        TR      Anticipated Outcomes Item Anticipated Outcome  Self Feeding No goal  Swallowing      Basic self-care  (S)  Toileting  (S)   Bathroom Transfers (S)  Bowel/Bladder  manage bowel with mod I assist  Transfers  modI with LRAD  Locomotion  modI with LRAD ambulatory  Communication     Cognition     Pain  < 4 with prns  Safety/Judgment  manage w cues   Therapy Plan: PT Intensity: Minimum of 1-2 x/day ,45 to 90 minutes PT Frequency: 5 out of 7 days PT Duration Estimated Length of Stay: 7-10 days OT Intensity: Minimum of 1-2 x/day, 45 to 90 minutes OT Frequency: 5 out of 7 days OT Duration/Estimated Length of Stay: 7-10days     Team Interventions: Nursing Interventions Medication Management, Discharge Planning, Bowel Management, Disease Management/Prevention, Pain Management, Patient/Family Education  PT interventions Ambulation/gait training, Discharge planning, DME/adaptive equipment instruction, Functional mobility training, Pain management, Therapeutic Activities, UE/LE Strength taining/ROM, Visual/perceptual remediation/compensation, Warden/ranger, Community reintegration, Disease  management/prevention, Neuromuscular re-education, Patient/family education, Skin care/wound management, Stair training, Therapeutic Exercise, UE/LE Coordination activities, Wheelchair propulsion/positioning  OT Interventions Warden/ranger, Discharge planning, Pain management, Self Care/advanced ADL retraining, Therapeutic Activities, UE/LE Coordination activities, Functional mobility training, Patient/family education, Skin care/wound managment, Therapeutic Exercise, Community reintegration, Fish farm manager, Psychosocial support, UE/LE Strength taining/ROM, Wheelchair propulsion/positioning  SLP Interventions    TR Interventions    SW/CM Interventions Discharge Planning, Psychosocial Support, Patient/Family Education   Barriers to Discharge MD  Medical stability  Nursing Decreased caregiver support, Home environment access/layout 1 level 2 ste right rail; caregiver for elderly spouse ; son to assist parents  PT Inaccessible home environment, Decreased caregiver support, Home environment access/layout 2 STE, decreased caregiver support, reports home with decreased accessibility with Rw  OT Decreased caregiver support, Home environment access/layout Pt states that bathroom [place] likely inaccessible with RW, husband unable to provide physical assistance  SLP      SW Insurance for SNF coverage, Lack of/limited family support     Team Discharge Planning: Destination: PT-Home ,OT- Home , SLP-  Projected Follow-up: PT-Home health PT, OT-  Home health OT, SLP-  Projected Equipment Needs: PT-Rolling walker with 5" wheels, OT- To be determined, SLP-  Equipment Details: PT- , OT-  Patient/family involved in discharge planning: PT- Patient,  OT-Patient, Family member/caregiver, SLP-   MD ELOS: 7-10 days Medical Rehab Prognosis:  Excellent Assessment: The patient has been admitted for CIR therapies with the diagnosis of right intertrochanteric hip fx s/p IMN. The team  will be addressing functional mobility, strength, stamina, balance, safety, adaptive techniques and equipment, self-care, bowel and bladder  mgt, patient and caregiver education, pain mgt, community reentry. Goals have been set at supervision for self-care activities and mod I for basic mobility at household level. Anticipated discharge destination is home with family helping.        See Team Conference Notes for weekly updates to the plan of care

## 2023-01-08 NOTE — Progress Notes (Signed)
Inpatient Rehabilitation Care Coordinator Assessment and Plan Patient Details  Name: [name] [name] MRN: [id number] Date of Birth: 21-Apr-1932  Today's Date: 01/08/2023  Hospital Problems: Principal Problem:   Intertrochanteric fracture of right hip [facility])  Past Medical History:  Past Medical History:  Diagnosis Date   Adenomatous colon polyp     4 2008   Breast cancer (HCC) 1978   left breast   Complication of anesthesia    Cystitis, interstitial    FRACTURE, ANKLE, LEFT 12/14/2006   Qualifier: Diagnosis of  By: Lawernce Ion, CMA (AAMA), Shannon S    FRACTURE, WRIST 12/14/2006   Qualifier: Diagnosis of  By: Lawernce Ion, CMA (AAMA), Bethann Berkshire    Hearing loss    does not use hearing aids   Hyperlipidemia    Hypertension    Osteoarthritis    PONV (postoperative nausea and vomiting)    Episode after Ether   Rotator cuff tear, right    Shingles    Urinary incontinence    Past Surgical History:  Past Surgical History:  Procedure Laterality Date   ABDOMINAL HYSTERECTOMY  08/03/1986   fibroids   BREAST BIOPSY Right 09/17/2021   BREAST LUMPECTOMY Right 10/27/2021   BREAST LUMPECTOMY WITH RADIOACTIVE SEED LOCALIZATION Right 10/27/2021   Procedure: RIGHT BREAST LUMPECTOMY WITH RADIOACTIVE SEED LOCALIZATION;  Surgeon: Griselda Miner, MD;  Location: [facility] OR;  Service: General;  Laterality: Right;   COLONOSCOPY     DILATION AND CURETTAGE OF UTERUS     x 2   INTRAMEDULLARY (IM) NAIL INTERTROCHANTERIC Right 01/01/2023   Procedure: INTRAMEDULLARY (IM) NAIL INTERTROCHANTERIC;  Surgeon: Samson Frederic, MD;  Location: [facility] ORS;  Service: Orthopedics;  Laterality: Right;   MASTECTOMY Left    SHOULDER ARTHROSCOPY WITH DISTAL CLAVICLE RESECTION Right 12/23/2017   Procedure: SHOULDER ARTHROSCOPY WITH DISTAL CLAVICLE RESECTION;  Surgeon: Francena Hanly, MD;  Location: [facility] OR;  Service: Orthopedics;  Laterality: Right;   SHOULDER ARTHROSCOPY WITH ROTATOR CUFF REPAIR AND SUBACROMIAL DECOMPRESSION Right  12/23/2017   Procedure: SHOULDER ARTHROSCOPY WITH ROTATOR CUFF REPAIR AND SUBACROMIAL DECOMPRESSION, distal clavicle resection;  Surgeon: Francena Hanly, MD;  Location: [facility] OR;  Service: Orthopedics;  Laterality: Right;   TONSILLECTOMY     TUBAL LIGATION  08/03/1973   WISDOM TOOTH EXTRACTION     upper wisdom teeth only ext   Social History:  reports that she has quit smoking. Her smoking use included cigarettes. She has a 3.75 pack-year smoking history. She has never used smokeless tobacco. She reports that she does not currently use alcohol. She reports that she does not use drugs.  Family / Support Systems Marital Status: Married Patient Roles: Spouse, Caregiver, Parent Spouse/Significant Other: Rhetta Mura [phone] Children: Andy-son lives in [place] [phone]  Another daughter is here from [place] and going back tomorrow. Has another daughter in [place] and may come down Other Supports: neighbors and freinds Anticipated Caregiver: Mardelle Matte works and lives in [place] he has hired Artist to assist Dad with home management taks since he gets around with rw and has no cognitive issues. We'll see the needs after evaluations today Ability/Limitations of Caregiver: Son works, may need to hire assist depends upon her care needs. Caregiver Availability: Other (Comment) (husband is there but can not assist pt) Family Dynamics: Close with son and two daughter's both daughter's out of state. pt was very independent prior to this and does everything now has decided to not clean the gutters anymore not more ladders. She was fighting with the recycle bin when she fell.  Social History Preferred language: English Religion: Christian Cultural Background: No issues Education: HS Health Literacy - How often do you need to have someone help you when you read instructions, pamphlets, or other written material from your doctor or pharmacy?: Never Writes: Yes Employment Status: Retired Marine scientist Issues: No  issues Guardian/Conservator: None-MD feels capable of making her own decisions she is just [facility] and speaks loudly   Abuse/Neglect Abuse/Neglect Assessment Can Be Completed: Yes Physical Abuse: Denies Verbal Abuse: Denies Sexual Abuse: Denies Exploitation of patient/patient's resources: Denies Self-Neglect: Denies  Patient response to: Social Isolation - How often do you feel lonely or isolated from those around you?: Never  Emotional Status Pt's affect, behavior and adjustment status: Pt is motivated to do well and recover. She was very independent prior to this and wants to get back to this level. Her daughter reports she is zocd and has to make sure everything is cleaned with bleach she has always been this way. Recent Psychosocial Issues: other healthj issues and husband's care at home Psychiatric History: No history seems to be coping appropriately and verbalizes her concerns and issues. Substance Abuse History: No issues  Patient / Family Perceptions, Expectations & Goals Pt/Family understanding of illness & functional limitations: Pt and daughter can explain her hip fracture and limitations. She reports she is sore today did too much yesterday in therapies. Both have spoken with the MD and feel they have a good understanding of her plan moving forward. She has fractured her ankle before and feels similar. Premorbid pt/family roles/activities: wife, caregiver, grandmother, retiree, home owner, neighbor, etc Anticipated changes in roles/activities/participation: resume Pt/family expectations/goals: Pt states: "  I want to be able to move around with this walker I think I will be able too."  Daughter states: " I know she will work hard it is in her nature."  Building surveyor: None Premorbid Home Care/DME Agencies: Other (Comment) (has had HH in the past with other fractures) Transportation available at discharge: Self husband does not drive Is the patient able to  respond to transportation needs?: Yes In the past 12 months, has lack of transportation kept you from medical appointments or from getting medications?: No In the past 12 months, has lack of transportation kept you from meetings, work, or from getting things needed for daily living?: No  Discharge Planning Living Arrangements: Spouse/significant other Support Systems: Spouse/significant other, Children, Friends/neighbors, Other relatives Type of Residence: Private residence Insurance Resources: Media planner (specify) Passenger transport manager) Financial Resources: Restaurant manager, fast food Screen Referred: No Living Expenses: Own Money Management: Patient Does the patient have any problems obtaining your medications?: No Home Management: self son has hired assist to do this now and assist husband Patient/Family Preliminary Plans: Return home with husband who can be there but not assist, he uses a rw himself. Pt reports their home is really small and the bathoroom scan not accommodate a walker in the doorway. Care Coordinator Barriers to Discharge: Insurance for SNF coverage, Lack of/limited family support Care Coordinator Anticipated Follow Up Needs: HH/OP  Clinical Impression Pleasant female who is HOH and speaks loudly, she is very independent and no one to ask for assist. She does all of the home management at home for both she and husband he is mobile and can do his ADL's. He uses a rolling walker so unable to assist her. Will await team's evaluations and work on discharge needs. May need to hire more assist than son has already for home.  Jonel Sick,  Lemar Livings 01/08/2023, 11:05 AM

## 2023-01-08 NOTE — Evaluation (Signed)
Occupational Therapy Assessment and Plan  Patient Details  Name: [name] [name] MRN: [id number] Date of Birth: 07-03-1932  OT Diagnosis: acute pain and muscle weakness (generalized) Rehab Potential: Rehab Potential (ACUTE ONLY): Excellent ELOS: 7-10days   Today's Date: 01/08/2023 OT Individual Time: 1050-1200 OT Individual Time Calculation (min): 70 min     Hospital Problem: Principal Problem:   Intertrochanteric fracture of right hip (HCC)   Past Medical History:  Past Medical History:  Diagnosis Date   Adenomatous colon polyp     4 2008   Breast cancer (HCC) 1978   left breast   Complication of anesthesia    Cystitis, interstitial    FRACTURE, ANKLE, LEFT 12/14/2006   Qualifier: Diagnosis of  By: Lawernce Ion, CMA (AAMA), Shannon S    FRACTURE, WRIST 12/14/2006   Qualifier: Diagnosis of  By: Lawernce Ion, CMA (AAMA), Bethann Berkshire    Hearing loss    does not use hearing aids   Hyperlipidemia    Hypertension    Osteoarthritis    PONV (postoperative nausea and vomiting)    Episode after Ether   Rotator cuff tear, right    Shingles    Urinary incontinence    Past Surgical History:  Past Surgical History:  Procedure Laterality Date   ABDOMINAL HYSTERECTOMY  08/03/1986   fibroids   BREAST BIOPSY Right 09/17/2021   BREAST LUMPECTOMY Right 10/27/2021   BREAST LUMPECTOMY WITH RADIOACTIVE SEED LOCALIZATION Right 10/27/2021   Procedure: RIGHT BREAST LUMPECTOMY WITH RADIOACTIVE SEED LOCALIZATION;  Surgeon: Griselda Miner, MD;  [facility];  Service: General;  Laterality: Right;   COLONOSCOPY     DILATION AND CURETTAGE OF UTERUS     x 2   INTRAMEDULLARY (IM) NAIL INTERTROCHANTERIC Right 01/01/2023   Procedure: INTRAMEDULLARY (IM) NAIL INTERTROCHANTERIC;  Surgeon: Samson Frederic, MD;  [facility]: [facility];  Service: Orthopedics;  Laterality: Right;   MASTECTOMY Left    SHOULDER ARTHROSCOPY WITH DISTAL CLAVICLE RESECTION Right 12/23/2017   Procedure: SHOULDER ARTHROSCOPY WITH DISTAL  CLAVICLE RESECTION;  Surgeon: Francena Hanly, MD;  [facility];  Service: Orthopedics;  Laterality: Right;   SHOULDER ARTHROSCOPY WITH ROTATOR CUFF REPAIR AND SUBACROMIAL DECOMPRESSION Right 12/23/2017   Procedure: SHOULDER ARTHROSCOPY WITH ROTATOR CUFF REPAIR AND SUBACROMIAL DECOMPRESSION, distal clavicle resection;  Surgeon: Francena Hanly, MD;  [facility];  Service: Orthopedics;  Laterality: Right;   TONSILLECTOMY     TUBAL LIGATION  08/03/1973   WISDOM TOOTH EXTRACTION     upper wisdom teeth only ext    Assessment & Plan Clinical Impression: [name] [name]. Poppy is a 89 year old right-handed female with history significant for prediabetes, colon polyps, left breast cancer status postmastectomy, interstitial cystitis, hearing loss, hyperlipidemia, hypertension, osteoarthritis with right rotator cuff tear and history of tobacco use.  Per chart review patient lives with spouse.  1 level home 2 steps to entry.  Independent and driving prior to admission.  Husband physically cannot provide assistance.  She does have supportive family in the area.  Presented 12/31/2022 after mechanical fall landing on her right hip.  No loss of consciousness.  Admission chemistries unremarkable except BUN 26, urinalysis negative nitrite.  X-rays and imaging revealed right intertrochanteric femur fracture.  Underwent intramedullary fixation 01/01/2023 per Dr. Samson Frederic.  Weightbearing as tolerated right lower extremity.  Postoperative course mild delirium with Geodon added recommendations of possible need for outpatient dementia screening and her confusion continues to improve.  Hospital course acute blood loss anemia from preop hemoglobin 14.8 and postoperative 11.2.  Placed on Lovenox for DVT prophylaxis.  Therapy evaluations completed due to patient decreased functional mobility was admitted for a comprehensive rehab program.    Patient transferred to CIR on 01/07/2023 .    Patient currently requires mod with basic  self-care skills secondary to muscle weakness, decreased coordination, decreased safety awareness, and decreased standing balance and decreased balance strategies.  Prior to hospitalization, patient could complete ADLs with independent .  Patient will benefit from skilled intervention to increase independence with basic self-care skills prior to discharge home with care partner.  Anticipate patient will require 24 hour supervision and follow up home health.  OT - End of Session Activity Tolerance: Tolerates 30+ min activity with multiple rests Endurance Deficit: Yes Endurance Deficit Description: Generalized weakness OT Assessment Rehab Potential (ACUTE ONLY): Excellent OT Barriers to Discharge: Decreased caregiver support;Home environment access/layout OT Barriers to Discharge Comments: Pt states that bathroom [place] likely inaccessible with RW, husband unable to provide physical assistance OT Patient demonstrates impairments in the following area(s): Balance;Safety;Skin Integrity;Edema;Endurance;Motor;Pain OT Basic ADL's Functional Problem(s): Bathing;Dressing;Toileting OT Advanced ADL's Functional Problem(s): Simple Meal Preparation;Light Housekeeping OT Transfers Functional Problem(s): Toilet;Tub/Shower OT Additional Impairment(s): None OT Plan OT Intensity: Minimum of 1-2 x/day, 45 to 90 minutes OT Frequency: 5 out of 7 days OT Duration/Estimated Length of Stay: 7-10days OT Treatment/Interventions: Balance/vestibular training;Discharge planning;Pain management;Self Care/advanced ADL retraining;Therapeutic Activities;UE/LE Coordination activities;Functional mobility training;Patient/family education;Skin care/wound managment;Therapeutic Exercise;Community reintegration;DME/adaptive equipment instruction;Psychosocial support;UE/LE Strength taining/ROM;Wheelchair propulsion/positioning OT Self Feeding Anticipated Outcome(s): No goal OT Basic Self-Care Anticipated Outcome(s): (S) OT Toileting  Anticipated Outcome(s): (S) OT Bathroom Transfers Anticipated Outcome(s): (S) OT Recommendation Recommendations for Other Services: Therapeutic Recreation consult Therapeutic Recreation Interventions: Pet therapy;Kitchen group Patient destination: Home Follow Up Recommendations: Home health OT Equipment Recommended: To be determined   OT Evaluation Precautions/Restrictions  Precautions Precautions: Fall Precaution Comments: HOH Restrictions Weight Bearing Restrictions: Yes RLE Weight Bearing: Weight bearing as tolerated Other Position/Activity Restrictions: hearing impaired General Chart Reviewed: Yes Family/Caregiver Present: Yes Vital Signs   Pain Pain Assessment Pain Scale:  (Pain-0) Pain Score: 0-No pain Faces Pain Scale: No hurt Home Living/Prior Functioning Home Living Living Arrangements: Spouse/significant other Available Help at Discharge: Neighbor, Family Type of Home: House Home Access: Stairs to enter Secretary/administrator of Steps: 2 Entrance Stairs-Rails: Right Home Layout: One level Bathroom Shower/Tub: Engineer, manufacturing systems: Handicapped height Bathroom Accessibility: Yes Additional Comments: pt reports house is minimally accessible with walker  Lives With: Spouse IADL History Homemaking Responsibilities: Yes Meal Prep Responsibility: Primary Laundry Responsibility: Primary Cleaning Responsibility: Primary Bill Paying/Finance Responsibility: Primary Shopping Responsibility: Primary Child Care Responsibility: No Homemaking Comments:  (Takes care of most IADLs as well as her husband who is 69 y/o) Current License: Yes Mode of Transportation: Set designer Occupation: Retired Leisure and Hobbies: Reading and past hobbie of Film/video editor Prior Function Level of Independence: Independent with gait, Independent with basic ADLs, Independent with transfers  Able to Take Stairs?: Yes Driving: Yes Vocation: Retired Administrator, sports Baseline Vision/History: 1  Wears glasses Ability to See in Adequate Light: 0 Adequate Patient Visual Report: No change from baseline Vision Assessment?: No apparent visual deficits Perception  Perception: Within Functional Limits Praxis Praxis: Intact Cognition Cognition Overall Cognitive Status: Within Functional Limits for tasks assessed Arousal/Alertness: Awake/alert Orientation Level: Person;Place;Situation Person: Oriented Place: Oriented Situation: Oriented Memory: Appears intact Attention: Selective Sustained Attention: Appears intact Selective Attention: Appears intact Awareness: Appears intact Problem Solving: Appears intact Safety/Judgment: Appears intact Brief Interview for Mental Status (BIMS) Repetition of Three Words (First  Attempt): 3 Temporal Orientation: Year: Correct Temporal Orientation: Month: Accurate within 5 days Temporal Orientation: Day: Correct Recall: "Sock": Yes, no cue required Recall: "Blue": Yes, no cue required Recall: "Bed": Yes, no cue required BIMS Summary Score: 15 Sensation Sensation Light Touch: Appears Intact Hot/Cold: Appears Intact Proprioception: Appears Intact Stereognosis: Appears Intact Coordination Gross Motor Movements are Fluid and Coordinated: Yes Fine Motor Movements are Fluid and Coordinated: Yes Coordination and Movement Description: Pain guarding and generalized weakness Motor  Motor Motor: Within Functional Limits Motor - Skilled Clinical Observations: general muscle weakness due to hip fracture  Trunk/Postural Assessment  Cervical Assessment Cervical Assessment: Within Functional Limits Thoracic Assessment Thoracic Assessment: Within Functional Limits Lumbar Assessment Lumbar Assessment: Within Functional Limits Postural Control Postural Control: Deficits on evaluation Righting Reactions: Delayed  Balance Balance Balance Assessed: Yes Static Sitting Balance Static Sitting - Balance Support: Feet supported Static Sitting - Level  of Assistance: 7: Independent Dynamic Sitting Balance Dynamic Sitting - Balance Support: Feet supported Dynamic Sitting - Level of Assistance: 5: Stand by assistance Dynamic Sitting - Balance Activities: Trunk control activities;Lateral lean/weight shifting;Reaching for objects;Forward lean/weight shifting;Reaching across midline Static Standing Balance Static Standing - Balance Support: Bilateral upper extremity supported Static Standing - Level of Assistance: 4: Min assist (CGA) Dynamic Standing Balance Dynamic Standing - Balance Support: Bilateral upper extremity supported Dynamic Standing - Level of Assistance: 4: Min assist Dynamic Standing - Balance Activities: Lateral lean/weight shifting;Forward lean/weight shifting Extremity/Trunk Assessment RUE Assessment RUE Assessment: Within Functional Limits General Strength Comments: WFL for age LUE Assessment LUE Assessment: Within Functional Limits General Strength Comments: WFL for age  Care Tool Care Tool Self Care Eating   Eating Assist Level: Independent    Oral Care    Oral Care Assist Level: Supervision/Verbal cueing    Bathing Bathing activity did not occur: Refused            Upper Body Dressing(including orthotics)   What is the patient wearing?: Dress   Assist Level: Supervision/Verbal cueing    Lower Body Dressing (excluding footwear)   What is the patient wearing?: Underwear/pull up Assist for lower body dressing: Moderate Assistance - Patient 50 - 74%    Putting on/Taking off footwear   What is the patient wearing?: Socks Assist for footwear: Maximal Assistance - Patient 25 - 49%       Care Tool Toileting Toileting activity   Assist for toileting: Moderate Assistance - Patient 50 - 74%     Care Tool Bed Mobility Roll left and right activity   Roll left and right assist level: Minimal Assistance - Patient > 75%    Sit to lying activity   Sit to lying assist level: Minimal Assistance - Patient >  75%    Lying to sitting on side of bed activity   Lying to sitting on side of bed assist level: the ability to move from lying on the back to sitting on the side of the bed with no back support.: Minimal Assistance - Patient > 75%     Care Tool Transfers Sit to stand transfer   Sit to stand assist level: Minimal Assistance - Patient > 75%    Chair/bed transfer   Chair/bed transfer assist level: Minimal Assistance - Patient > 75%     Toilet transfer   Assist Level: Minimal Assistance - Patient > 75%     Care Tool Cognition  Expression of Ideas and Wants Expression of Ideas and Wants: 4. Without difficulty (complex and basic) - expresses  complex messages without difficulty and with speech that is clear and easy to understand  Understanding Verbal and Non-Verbal Content Understanding Verbal and Non-Verbal Content: 4. Understands (complex and basic) - clear comprehension without cues or repetitions   Memory/Recall Ability Memory/Recall Ability : Current season;That he or she is in a hospital/hospital unit   Refer to Care Plan for Long Term Goals  SHORT TERM GOAL WEEK 1 OT Short Term Goal 1 (Week 1): STG=LTG d/t ELOS  Recommendations for other services: Therapeutic Recreation  Pet therapy and Kitchen group   Skilled Therapeutic Intervention ADL ADL Eating: Independent Where Assessed-Eating: Wheelchair Grooming: Supervision/safety Where Assessed-Grooming: Sitting at sink Upper Body Bathing: Supervision/safety Where Assessed-Upper Body Bathing: Wheelchair Lower Body Bathing: Moderate assistance Where Assessed-Lower Body Bathing: Wheelchair Upper Body Dressing: Supervision/safety Where Assessed-Upper Body Dressing: Wheelchair Lower Body Dressing: Moderate assistance Where Assessed-Lower Body Dressing: Wheelchair Toileting: Moderate assistance Where Assessed-Toileting: Teacher, adult education: Furniture conservator/restorer Method: Surveyor, minerals: Raised  toilet seat Tub/Shower Transfer: Moderate assistance Tub/Shower Transfer Method: Stand pivot Tub/Shower Equipment: Transfer tub bench;Grab bars Mobility  Bed Mobility Bed Mobility: Rolling Right;Rolling Left;Supine to Sit Rolling Right: Supervision/verbal cueing (verbal cues for technique and sequencing, cues to attempt without hospital bed rails) Rolling Left: Minimal Assistance - Patient > 75% (verbal cues for technique and sequencing, cues to attempt without hospital bed rails) Supine to Sit: Minimal Assistance - Patient > 75% (verbal cues for technique and sequencing) Transfers Sit to Stand: Minimal Assistance - Patient > 75% Stand to Sit: Minimal Assistance - Patient > 75%  Skilled OT eval completed.  OT plan of care created.  OT goals set at (S).  Pt currently able to complete ADL tasks with Mod A with LB B/D, CGA with functional mobility and Mod A with toileting.  Pt limited by decreased balance strategies, generalized weakness and pain. Pt in wheelchair with alarm set and all needs met, call light within reach.     Discharge Criteria: Patient will be discharged from OT if patient refuses treatment 3 consecutive times without medical reason, if treatment goals not met, if there is a change in medical status, if patient makes no progress towards goals or if patient is discharged from hospital.  The above assessment, treatment plan, treatment alternatives and goals were discussed and mutually agreed upon: by patient and by family  Liam Graham 01/08/2023, 12:26 PM

## 2023-01-08 NOTE — Progress Notes (Signed)
Lower extremity venous bilateral study completed.  Preliminary results relayed to Angiulli, PA.   See CV Proc for preliminary results report.   Jean Rosenthal, RDMS, RVT

## 2023-01-08 NOTE — Plan of Care (Signed)
  Problem: RH Balance Goal: LTG Patient will maintain dynamic standing balance (PT) Description: LTG:  Patient will maintain dynamic standing balance with assistance during mobility activities (PT) Flowsheets (Taken 01/08/2023 1233) LTG: Pt will maintain dynamic standing balance during mobility activities with:: Independent with assistive device    Problem: Sit to Stand Goal: LTG:  Patient will perform sit to stand with assistance level (PT) Description: LTG:  Patient will perform sit to stand with assistance level (PT) Flowsheets (Taken 01/08/2023 1233) LTG: PT will perform sit to stand in preparation for functional mobility with assistance level: Independent with assistive device   Problem: RH Bed Mobility Goal: LTG Patient will perform bed mobility with assist (PT) Description: LTG: Patient will perform bed mobility with assistance, with/without cues (PT). Flowsheets (Taken 01/08/2023 1233) LTG: Pt will perform bed mobility with assistance level of: Independent   Problem: RH Bed to Chair Transfers Goal: LTG Patient will perform bed/chair transfers w/assist (PT) Description: LTG: Patient will perform bed to chair transfers with assistance (PT). Flowsheets (Taken 01/08/2023 1233) LTG: Pt will perform Bed to Chair Transfers with assistance level: Independent with assistive device    Problem: RH Car Transfers Goal: LTG Patient will perform car transfers with assist (PT) Description: LTG: Patient will perform car transfers with assistance (PT). Flowsheets (Taken 01/08/2023 1233) LTG: Pt will perform car transfers with assist:: Independent with assistive device    Problem: RH Ambulation Goal: LTG Patient will ambulate in controlled environment (PT) Description: LTG: Patient will ambulate in a controlled environment, # of feet with assistance (PT). Flowsheets (Taken 01/08/2023 1233) LTG: Pt will ambulate in controlled environ  assist needed:: Supervision/Verbal cueing LTG: Ambulation distance in  controlled environment: 150' Note: With LRAD Goal: LTG Patient will ambulate in home environment (PT) Description: LTG: Patient will ambulate in home environment, # of feet with assistance (PT). Flowsheets (Taken 01/08/2023 1233) LTG: Pt will ambulate in home environ  assist needed:: Independent with assistive device LTG: Ambulation distance in home environment: 78' Note: With LRAD   Problem: RH Stairs Goal: LTG Patient will ambulate up and down stairs w/assist (PT) Description: LTG: Patient will ambulate up and down # of stairs with assistance (PT) Flowsheets (Taken 01/08/2023 1233) LTG: Pt will ambulate up/down stairs assist needed:: Supervision/Verbal cueing LTG: Pt will  ambulate up and down number of stairs: 2 Note: With RHR to simulate home environment

## 2023-01-08 NOTE — Evaluation (Signed)
Physical Therapy Assessment and Plan  Patient Details  Name: DELSIA RESNER MRN: [id number] Date of Birth: 1931/10/10  PT Diagnosis: Abnormality of gait, Difficulty walking, Muscle weakness, and Pain in R Hip Rehab Potential: Good ELOS: 7-10 days   Today's Date: 01/08/2023 PT Individual Time: 0901-1005 + [id number] PT Individual Time Calculation (min): 64 min  + 77 min  Hospital Problem: Principal Problem:   Intertrochanteric fracture of right hip [facility])   Past Medical History:  Past Medical History:  Diagnosis Date   Adenomatous colon polyp     4 2008   Breast cancer (HCC) 1978   left breast   Complication of anesthesia    Cystitis, interstitial    FRACTURE, ANKLE, LEFT 12/14/2006   Qualifier: Diagnosis of  By: Lawernce Ion, CMA (AAMA), Shannon S    FRACTURE, WRIST 12/14/2006   Qualifier: Diagnosis of  By: Lawernce Ion, CMA (AAMA), Bethann Berkshire    Hearing loss    does not use hearing aids   Hyperlipidemia    Hypertension    Osteoarthritis    PONV (postoperative nausea and vomiting)    Episode after Ether   Rotator cuff tear, right    Shingles    Urinary incontinence    Past Surgical History:  Past Surgical History:  Procedure Laterality Date   ABDOMINAL HYSTERECTOMY  08/03/1986   fibroids   BREAST BIOPSY Right 09/17/2021   BREAST LUMPECTOMY Right 10/27/2021   BREAST LUMPECTOMY WITH RADIOACTIVE SEED LOCALIZATION Right 10/27/2021   Procedure: RIGHT BREAST LUMPECTOMY WITH RADIOACTIVE SEED LOCALIZATION;  Surgeon: Griselda Miner, MD;  Location: [facility] OR;  Service: General;  Laterality: Right;   COLONOSCOPY     DILATION AND CURETTAGE OF UTERUS     x 2   INTRAMEDULLARY (IM) NAIL INTERTROCHANTERIC Right 01/01/2023   Procedure: INTRAMEDULLARY (IM) NAIL INTERTROCHANTERIC;  Surgeon: Samson Frederic, MD;  Location: [facility];  Service: Orthopedics;  Laterality: Right;   MASTECTOMY Left    SHOULDER ARTHROSCOPY WITH DISTAL CLAVICLE RESECTION Right 12/23/2017   Procedure: SHOULDER ARTHROSCOPY  WITH DISTAL CLAVICLE RESECTION;  Surgeon: Francena Hanly, MD;  Location: [facility] OR;  Service: Orthopedics;  Laterality: Right;   SHOULDER ARTHROSCOPY WITH ROTATOR CUFF REPAIR AND SUBACROMIAL DECOMPRESSION Right 12/23/2017   Procedure: SHOULDER ARTHROSCOPY WITH ROTATOR CUFF REPAIR AND SUBACROMIAL DECOMPRESSION, distal clavicle resection;  Surgeon: Francena Hanly, MD;  Location: [facility] OR;  Service: Orthopedics;  Laterality: Right;   TONSILLECTOMY     TUBAL LIGATION  08/03/1973   WISDOM TOOTH EXTRACTION     upper wisdom teeth only ext    Assessment & Plan Clinical Impression: Patient is a 89 year old right-handed female with history significant for prediabetes, colon polyps, left breast cancer status postmastectomy, interstitial cystitis, hearing loss, hyperlipidemia, hypertension, osteoarthritis with right rotator cuff tear and history of tobacco use. Per chart review patient lives with spouse. 1 level home 2 steps to entry. Independent and driving prior to admission. Husband physically cannot provide assistance. She does have supportive family in the area. Presented 12/31/2022 after mechanical fall landing on her right hip. No loss of consciousness. Admission chemistries unremarkable except BUN 26, urinalysis negative nitrite. X-rays and imaging revealed right intertrochanteric femur fracture. Underwent intramedullary fixation 01/01/2023 per Dr. Samson Frederic. Weightbearing as tolerated right lower extremity. Postoperative course mild delirium with Geodon added recommendations of possible need for outpatient dementia screening and her confusion continues to improve. Hospital course acute blood loss anemia from preop hemoglobin 14.8 and postoperative 11.2. Placed on Lovenox for DVT prophylaxis. Therapy evaluations completed due  to patient decreased functional mobility was admitted for a comprehensive rehab program.   Patient currently requires min with mobility secondary to muscle weakness, decreased  cardiorespiratoy endurance, impaired timing and sequencing and decreased motor planning,  , and decreased standing balance, decreased postural control, and decreased balance strategies.  Prior to hospitalization, patient was independent  with mobility and lived with Spouse in a House home.  Home access is 2Stairs to enter.  Patient will benefit from skilled PT intervention to maximize safe functional mobility, minimize fall risk, and decrease caregiver burden for planned discharge home with intermittent assist.  Anticipate patient will benefit from follow up [facility] at discharge.  PT - End of Session Activity Tolerance: Tolerates 30+ min activity without fatigue Endurance Deficit: Yes Endurance Deficit Description: generalized weakness PT Assessment Rehab Potential (ACUTE/IP ONLY): Good PT Barriers to Discharge: Inaccessible home environment;Decreased caregiver support;Home environment access/layout PT Barriers to Discharge Comments: 2 STE, decreased caregiver support, reports home with decreased accessibility with Rw PT Patient demonstrates impairments in the following area(s): Balance;Endurance;Pain;Perception;Safety PT Transfers Functional Problem(s): Bed Mobility;Bed to Chair;Car;Furniture PT Locomotion Functional Problem(s): Ambulation;Stairs PT Plan PT Intensity: Minimum of 1-2 x/day ,45 to 90 minutes PT Frequency: 5 out of 7 days PT Duration Estimated Length of Stay: 7-10 days PT Treatment/Interventions: Ambulation/gait training;Discharge planning;DME/adaptive equipment instruction;Functional mobility training;Pain management;Therapeutic Activities;UE/LE Strength taining/ROM;Visual/perceptual remediation/compensation;Balance/vestibular training;Community reintegration;Disease management/prevention;Neuromuscular re-education;Patient/family education;Skin care/wound management;Stair training;Therapeutic Exercise;UE/LE Coordination activities;Wheelchair propulsion/positioning PT Transfers  Anticipated Outcome(s): modI with LRAD PT Locomotion Anticipated Outcome(s): modI with LRAD ambulatory PT Recommendation Follow Up Recommendations: Home health PT Patient destination: Home Equipment Recommended: Rolling walker with 5" wheels   PT Evaluation Precautions/Restrictions Precautions Precautions: Fall Precaution Comments: HOH Restrictions Weight Bearing Restrictions: Yes RLE Weight Bearing: Weight bearing as tolerated Other Position/Activity Restrictions: hearing impaired Pain Pain Assessment Pain Scale:  (Pain-0) Pain Score: 0-No pain Faces Pain Scale: No hurt Pain Interference Pain Interference Pain Effect on Sleep: 2. Occasionally Pain Interference with Therapy Activities: 3. Frequently Pain Interference with Day-to-Day Activities: 2. Occasionally Home Living/Prior Functioning Home Living Living Arrangements: Spouse/significant other Available Help at Discharge: Neighbor;Family Type of Home: House Home Access: Stairs to enter [employer] of Steps: 2 Entrance Stairs-Rails: Right Home Layout: One level Bathroom Shower/Tub: Engineer, manufacturing systems: Handicapped height Bathroom Accessibility: Yes Additional Comments: pt reports house is minimally accessible with walker  Lives With: Spouse Prior Function Level of Independence: Independent with gait;Independent with basic ADLs;Independent with transfers  Able to Take Stairs?: Yes Driving: Yes Vocation: Retired Vision/Perception  Vision - History Ability to See in Adequate Light: 0 Adequate Perception Perception: Within Functional Limits Praxis Praxis: Intact  Cognition Overall Cognitive Status: Within Functional Limits for tasks assessed Arousal/Alertness: Awake/alert Orientation Level: Oriented X4 Year: 2024 Month: June Day of Week: Correct Attention: Selective Sustained Attention: Appears intact Selective Attention: Appears intact Memory: Appears intact Awareness: Appears  intact Problem Solving: Appears intact Safety/Judgment: Appears intact Sensation Sensation Light Touch: Appears Intact Hot/Cold: Appears Intact Proprioception: Appears Intact Stereognosis: Appears Intact Coordination Gross Motor Movements are Fluid and Coordinated: Yes Fine Motor Movements are Fluid and Coordinated: Yes Coordination and Movement Description: Pain guarding and generalized weakness Motor  Motor Motor: Within Functional Limits Motor - Skilled Clinical Observations: general muscle weakness due to hip fracture   Trunk/Postural Assessment  Cervical Assessment Cervical Assessment: Within Functional Limits Thoracic Assessment Thoracic Assessment: Within Functional Limits Lumbar Assessment Lumbar Assessment: Within Functional Limits Postural Control Postural Control: Deficits on evaluation Righting Reactions: decreased Protective Responses: decreased Postural Limitations:  decreased  Balance Balance Balance Assessed: Yes Static Sitting Balance Static Sitting - Balance Support: Feet supported Static Sitting - Level of Assistance: 7: Independent Dynamic Sitting Balance Dynamic Sitting - Balance Support: Feet supported Dynamic Sitting - Level of Assistance: 5: Stand by assistance Dynamic Sitting - Balance Activities: Trunk control activities;Lateral lean/weight shifting;Reaching for objects;Forward lean/weight shifting;Reaching across midline Static Standing Balance Static Standing - Balance Support: Bilateral upper extremity supported;No upper extremity supported Static Standing - Level of Assistance: 5: Stand by assistance;4: Min assist (CGA with BUE support, CGA/min assist without UE support) Dynamic Standing Balance Dynamic Standing - Balance Support: Bilateral upper extremity supported Dynamic Standing - Level of Assistance: 4: Min assist Dynamic Standing - Balance Activities: Lateral lean/weight shifting;Forward lean/weight shifting Extremity Assessment  RLE  Assessment RLE Assessment: Exceptions to [facility] General Strength Comments: grossly 3/5, limited due to pain LLE Assessment LLE Assessment: Exceptions to [facility] General Strength Comments: grossly 4+/5, difficulty sequencing and understanding for formal assessment  Care Tool Care Tool Bed Mobility Roll left and right activity   Roll left and right assist level: Minimal Assistance - Patient > 75%    Sit to lying activity   Sit to lying assist level: Minimal Assistance - Patient > 75%    Lying to sitting on side of bed activity   Lying to sitting on side of bed assist level: the ability to move from lying on the back to sitting on the side of the bed with no back support.: Minimal Assistance - Patient > 75%     Care Tool Transfers Sit to stand transfer   Sit to stand assist level: Minimal Assistance - Patient > 75%    Chair/bed transfer   Chair/bed transfer assist level: Minimal Assistance - Patient > 75%     Toilet transfer   Assist Level: Minimal Assistance - Patient > 75%    Car transfer   Car transfer assist level: Minimal Assistance - Patient > 75%      Care Tool Locomotion Ambulation   Assist level: Minimal Assistance - Patient > 75% Assistive device: Walker-rolling Max distance: 95'  Walk 10 feet activity   Assist level: Minimal Assistance - Patient > 75% Assistive device: Walker-rolling   Walk 50 feet with 2 turns activity  Assist level: Minimal Assistance - Patient > 75% Assistive device: Walker-rolling  Walk 150 feet activity Walk 150 feet activity did not occur: Safety/medical concerns      Walk 10 feet on uneven surfaces activity Walk 10 feet on uneven surfaces activity did not occur: Safety/medical concerns      Stairs  Assist level: Moderate Assistance - Patient - 50 - 74% Stairs assistive device: 2 hand rails Max number of stairs: 1  Walk up/down 1 step activity  Walk up/down 1 step (curb) assist level: Moderate Assistance - Patient - 50 - 74% Walk  up/down 1 step or curb assistive device: 2 hand rails  Walk up/down 4 steps activity Walk up/down 4 steps activity did not occur: Safety/medical concerns      Walk up/down 12 steps activity Walk up/down 12 steps activity did not occur: Safety/medical concerns      Pick up small objects from floor Pick up small object from the floor (from standing position) activity did not occur: Safety/medical concerns      Wheelchair Is the patient using a wheelchair?: Yes Type of Wheelchair: Manual   Wheelchair assist level: Dependent - Patient 0% Max wheelchair distance: 10'  Wheel 50 feet with 2 turns  activity   Assist Level: Dependent - Patient 0%  Wheel 150 feet activity   Assist Level: Dependent - Patient 0%    Refer to Care Plan for Long Term Goals  SHORT TERM GOAL WEEK 1 PT Short Term Goal 1 (Week 1): STG = LTG secondary to ELOS  Recommendations for other services: None   Skilled Therapeutic Intervention SESSION 1: Evaluation completed (see details above and below) with education on PT POC and goals and individual treatment initiated with focus on gait training, transfer training, and standing tolerance with dynamic UE tasks. Pt presents in bed, reporting incontinence and stiffness in R hip however denies pain at rest. Pt completes subjective and objective measurements during session, pt does demonstrate some difficulty with sequencing and comprehending verbal commands for objective measurements however difficult to assess between cognitive comprehension deficits and hearing deficits. Pt completes rolling without hospital bed rails supervision to R, min assist to L due to insufficient core and RLE strength/mobility to complete task. Supine to sit with min assist for trunk to upright. Pt ambulates to bathroom with RW CGA/min assist demonstrating step to gait pattern, increased UE support with RLE weightbearing, no buckle noted. Pt requires verbal cueing for positioning prior to sitting on  toilet secondary to difficulty sequencing RW for turning, min assist for stand to sit on toilet for eccentric control. Pt provided with education on incontinence and using call light to go to restroom with nursing staff to reduce possibility of infection and skin breakdown with pt requiring reinforcement. Pt requires total assist for periarea hygiene in standing secondary to fear of falling without UE support, maintains standing with perturbations for ~2 min for periarea hygiene and donning new brief. Pt ambulates 20' from bathroom to [facility] CGA, again requires verbal cueing for turning RW and sequencing positioning prior to sit. Pt takes seated rest break and then walks to sink with RW CGA and maintains standing at sink for with intermittent UE support for brushing teeth. Pt then walks back to [facility] with RW CGA, verbal cues for positioning prior to sitting. Pt remains seated while MD enters room to speak with patient. Pt remains seated at end of session with all needs within reach, call light in place, chair alarm donned and activated, and pt daughter entering room at end of session.   SESSION 2: Pt presents in room in [facility], reports having incontinent episode and requesting to get cleaned up. Session focused on therapeutic activities such as transfer training, standing tolerance, education about incontinence and complications with incontinence, as well as gait training and therapeutic exercise. Pt completes sit<>stands with CGA throughout session, pt requires CGA/min assist for stand pivot transfers and mod verbal cues for sequencing, improves with repetition.  Pt completes ambulatory transfer from [facility] to bathroom ~20' with RW CGA, verbal cues for navigating obstacles and positioning prior to transfer. Pt requires total assist for doffing/donning brief, continent of urine once seated on toilet (charted) and completes periarea hygiene in standing with unilateral UE support and CGA/min assist for dynamic balance. Pt  maintains standing for ~4 minutes for task. Pt ambulates to sink to complete hand hygiene with verbal cues for visual scanning to find soap. Pt returns to sitting in [facility] with verbal cues for positioning prior to transfer.  Pt transported dependently from room to ortho gym, completes ambulatory transfer to car with RW CGA/min assist. Pt able to manage RLE into/out of car with assist from BUEs and min assist from therapist. Pt ambulates back to  WC and transported to main gym.  Pt ambulates with RW 95' with CGA slow gait speed, verbal cues for step through gait pattern to increased RLE weightbearing. Pt requires x2 standing rest breaks due to fatigue. Pt demonstrating improved LLE step length with increased distance ambulated.  Pt then positioned in front of 3" step, completes up forward/down backward 3" step with BUE support on BHRs with mod assist secondary to poor tolerance of weightbearing on RLE. Verbal cues provided for sequencing stepping up with LLE, down with RLE.  Pt requires extended seated rest break due to fatigue, therapist encourages fluids and provides pt with water during this time.  Pt completes standing therex with BUE support on RW to promote BLE strengthening, activity tolerance, and muscle fiber recruitment needed for functional transfers including: Marching alternating BLE x10 (verbal cues for increasing hip flexion ROM with activity) Heel raise x20 Mini squats x10  Pt returned to room dependently in [facility] for time management. Pt completes ambulatory transfer from [facility] to bed, verbal cues for positioning prior to sitting. Pt completes sit to supine with min assist for managing RLE into bed and repositioning once in supine. Pt remains supine with heels elevated, all needs within reach, call light in place, and bed alarm activated at end of session. Therapist communicates with RN about toileting schedule via electronic communication at end of session.    Mobility Bed Mobility Bed  Mobility: Rolling Right;Rolling Left;Supine to Sit Rolling Right: Supervision/verbal cueing (verbal cues for technique and sequencing, cues to attempt without hospital bed rails) Rolling Left: Minimal Assistance - Patient > 75% (verbal cues for technique and sequencing, cues to attempt without hospital bed rails) Supine to Sit: Minimal Assistance - Patient > 75% (verbal cues for technique and sequencing) Transfers Transfers: Sit to Stand;Stand to Sit;Stand Pivot Transfers Sit to Stand: Minimal Assistance - Patient > 75% Stand to Sit: Minimal Assistance - Patient > 75% Stand Pivot Transfers: Minimal Assistance - Patient > 75% Stand Pivot Transfer Details: Tactile cues for weight beaing;Verbal cues for precautions/safety;Verbal cues for technique;Verbal cues for sequencing Stand Pivot Transfer Details (indicate cue type and reason): does require verbal cues for sequencing for positioning prior to sit with pt with difficulty managing RW with turns Transfer (Assistive device): Rolling walker Locomotion  Gait Ambulation: Yes Gait Assistance: Minimal Assistance - Patient > 75% Gait Distance (Feet): 20 Feet Assistive device: Rolling walker Gait Assistance Details: Tactile cues for weight shifting;Verbal cues for sequencing;Verbal cues for technique;Verbal cues for precautions/safety;Verbal cues for safe use of DME/AE Gait Assistance Details: verbal cues for positioning and sequencing with difficulty managing RW with turns Gait Gait: Yes Gait Pattern: Decreased stance time - right;Step-to pattern;Trunk flexed Gait velocity: decreased Stairs / Additional Locomotion Stairs: No Wheelchair Mobility Wheelchair Mobility: No   Discharge Criteria: Patient will be discharged from PT if patient refuses treatment 3 consecutive times without medical reason, if treatment goals not met, if there is a change in medical status, if patient makes no progress towards goals or if patient is discharged from  hospital.  The above assessment, treatment plan, treatment alternatives and goals were discussed and mutually agreed upon: by patient  Edwin Cap PT, DPT 01/08/2023, 12:32 PM

## 2023-01-08 NOTE — Progress Notes (Signed)
[facility] Individual Statement of Services  Patient Name:  [name] [name]  Date:  01/08/2023  Welcome to the [facility].  Our goal is to provide you with an individualized program based on your diagnosis and situation, designed to meet your specific needs.  With this comprehensive rehabilitation program, you will be expected to participate in at least 3 hours of rehabilitation therapies Monday-Friday, with modified therapy programming on the weekends.  Your rehabilitation program will include the following services:  Physical Therapy (PT), Occupational Therapy (OT), 24 hour per day rehabilitation nursing, Therapeutic Recreaction (TR), Care Coordinator, Rehabilitation Medicine, Nutrition Services, and Pharmacy Services  Weekly team conferences will be held on Wednesday to discuss your progress.  Your Inpatient Rehabilitation Care Coordinator will talk with you frequently to get your input and to update you on team discussions.  Team conferences with you and your family in attendance may also be held.  Expected length of stay: 7-10 days  Overall anticipated outcome: supervision level  Depending on your progress and recovery, your program may change. Your Inpatient Rehabilitation Care Coordinator will coordinate services and will keep you informed of any changes. Your Inpatient Rehabilitation Care Coordinator's name and contact numbers are listed  below.  The following services may also be recommended but are not provided by the [facility]:  Driving Evaluations Home Health Rehabiltiation Services Outpatient Rehabilitation Services    Arrangements will be made to provide these services after discharge if needed.  Arrangements include referral to agencies that provide these services.  Your insurance has been verified to be:  [employer] Your primary doctor is:  Berniece Andreas  Pertinent information will be shared with your doctor  and your insurance company.  Inpatient Rehabilitation Care Coordinator:  Dossie Der, [place] [phone] or Luna Glasgow  Information discussed with and copy given to patient by: Lucy Chris, 01/08/2023, 11:16 AM

## 2023-01-08 NOTE — Progress Notes (Addendum)
PROGRESS NOTE   Subjective/Complaints: Patient had a good night. Pain is controlled although lovenox injection was a bit painful this morning. Feels that right leg is stiffer because she didn't get up much at [facility].   ROS: Patient denies fever, rash, sore throat, blurred vision, dizziness, nausea, vomiting, diarrhea, cough, shortness of breath or chest pain, headache, or mood change.    Objective:   No results found. Recent Labs    01/08/23 0705  WBC 9.6  HGB 11.8*  HCT 36.5  PLT 236   Recent Labs    01/08/23 0705  NA 137  K 3.9  CL 104  CO2 24  GLUCOSE 116*  BUN 26*  CREATININE 0.82  CALCIUM 8.7*    Intake/Output Summary (Last 24 hours) at 01/08/2023 1006 Last data filed at 01/07/2023 1851 Gross per 24 hour  Intake 120 ml  Output --  Net 120 ml        Physical Exam: Vital Signs Blood pressure 121/73, pulse 77, temperature 98.5 F (36.9 C), temperature source Oral, resp. rate 18, height 5\' 2"  (1.575 m), weight 54.4 kg, SpO2 100 %.  General: Alert and oriented x 3, No apparent distress HEENT: Head is normocephalic, atraumatic, PERRLA, EOMI, sclera anicteric, oral mucosa pink and moist, dentition intact, ext ear canals clear,  Neck: Supple without JVD or lymphadenopathy Heart: Reg rate and rhythm. No murmurs rubs or gallops Chest: CTA bilaterally without wheezes, rales, or rhonchi; no distress Abdomen: Soft, non-tender, non-distended, bowel sounds positive. Extremities: No clubbing, cyanosis. RLE with 1+ edema. Pulses are 2+ Psych: Pt's affect is appropriate. Pt is cooperative Skin: Clean and intact without signs of breakdown, bruising right thigh. Post-op dressing in place with spots of old blood present Neuro:  Alert and oriented x 3. Normal insight and awareness. Intact Memory. Normal language and speech. Cranial nerve exam unremarkable. MMT: UE 5/5. LLE 4/5. RLE 2+ to 3/5 HF, 3+/5 KE and 4/5. No focal sensory  findings.   Musculoskeletal: right thigh swollen, tender with palpation and PROM    Assessment/Plan: 1. Functional deficits which require 3+ hours per day of interdisciplinary therapy in a comprehensive inpatient rehab setting. Physiatrist is providing close team supervision and 24 hour management of active medical problems listed below. Physiatrist and rehab team continue to assess barriers to discharge/monitor patient progress toward functional and medical goals  Care Tool:  Bathing              Bathing assist       Upper Body Dressing/Undressing Upper body dressing        Upper body assist      Lower Body Dressing/Undressing Lower body dressing            Lower body assist       Toileting Toileting    Toileting assist       Transfers Chair/bed transfer  Transfers assist           Locomotion Ambulation   Ambulation assist              Walk 10 feet activity   Assist           Walk 50 feet activity  Assist           Walk 150 feet activity   Assist           Walk 10 feet on uneven surface  activity   Assist           Wheelchair     Assist               Wheelchair 50 feet with 2 turns activity    Assist            Wheelchair 150 feet activity     Assist          Blood pressure 121/73, pulse 77, temperature 98.5 F (36.9 C), temperature source Oral, resp. rate 18, height 5\' 2"  (1.575 m), weight 54.4 kg, SpO2 100 %.  Medical Problem List and Plan: 1. Functional deficits secondary to right intertrochanteric femur fracture status post intramedullary fixation 01/01/2023.   -Weightbearing as tolerated             -patient may shower with surgical dressing covered             -ELOS/Goals: 18-21 days, SPV PT, Min A OT  -Patient is beginning CIR therapies today including PT and OT   -pt appears very motivated and wants to regain independence. Spoke with daughter today.  2.   Antithrombotics: -DVT/anticoagulation:  Pharmaceutical: Lovenox check vascular study.  Patient can transition to aspirin therapy on discharge for DVT prophylaxis             -antiplatelet therapy: N/A 3. Pain Management: Hydrocodone/Robaxin as needed  -pain is well controlled 4. Mood/Behavior/Sleep: Provide emotional support             -antipsychotic agents: Geodon as needed  -pt is positive and in good spirits presently 5. Neuropsych/cognition: This patient is capable of making decisions on her own behalf. 6. Skin/Wound Care: Routine skin checks. Continue post-op dressing 7. Fluids/Electrolytes/Nutrition: encourage PO  -BUN sl increased. Discussed fluid intake with pt. Says she doesn't like water. Told her to drink whatever she liked 8.  Acute blood loss anemia.  Follow-up hgb 11.8  -encourage appropriate dietary intake 9.  Postoperative delirium.  Improved.  Consider outpatient dementia screening.  -pt reports that period of confusion was very scary for her 10.  Hypertension.  Norvasc 5 mg daily, Toprol-XL 12.5 mg daily.  Monitor with increased mobility  -6/7 bp well controlled 11.  History of left breast cancer.  Continue from area.  Follow-up outpatient oncology 12.  Prediabetes.  SSI.  Latest hemoglobin A1c 5.6-6.3.  -sugars all nearly normal--dc cbg checks and ssi 13.  History of interstitial cystitis with concurrent incontinence.  Admission urinalysis negative.  Pure wick discontinued  -pt is voiding continently 14.  Constipation.  MiraLAX daily, Senokot/Colace twice daily.   -LBM 6/5 by chart documentation.     LOS: 1 days A FACE TO FACE EVALUATION WAS PERFORMED  Ranelle Oyster 01/08/2023, 10:06 AM

## 2023-01-08 NOTE — Progress Notes (Addendum)
Called [facility] with results of lower extremity ultrasound.   Addendum:  D/w [address]; on lit review perforator PTV not within proximity of saphenous junction to be considered DVT. Tx with ppx lovenox and TEDs during daytime. Defer re-testing duplex to primary team within next few weeks.

## 2023-01-09 LAB — GLUCOSE, CAPILLARY: Glucose-Capillary: 118 mg/dL — ABNORMAL HIGH (ref 70–99)

## 2023-01-09 NOTE — Progress Notes (Signed)
PROGRESS NOTE   Subjective/Complaints:  No events overnight.  Diatolic hypotension overnight. LBM 6/8, no incontinence documented however, patient complains of nighttime urinary urgency and incontinence as a limiting barrier for her drinking more fluids during the day.  ROS:  + urge incontinence + pain Patient denies fever, rash, sore throat, blurred vision, dizziness, nausea, vomiting, diarrhea, cough, shortness of breath or chest pain, headache, or mood change.    Objective:   VAS [place] LOWER EXTREMITY VENOUS (DVT)  Result Date: 01/08/2023  Lower Venous DVT Study Patient Name:  [name] [name]  Date of Exam:   01/08/2023 Medical Rec #: [id number]         Accession #:    [id number] Date of Birth: 05-Nov-1931         Patient Gender: F Patient Age:   89 years Exam Location[facility] Procedure:      VAS [place] LOWER EXTREMITY VENOUS (DVT) Referring Phys: Mariam Dollar --------------------------------------------------------------------------------  Indications: Pain and swelling s/p right hip IM nail 01-01-2023.  Comparison Study: No prior studies. Performing Technologist: Jean Rosenthal RDMS, RVT  Examination Guidelines: A complete evaluation includes B-mode imaging, spectral Doppler, color Doppler, and power Doppler as needed of all accessible portions of each vessel. Bilateral testing is considered an integral part of a complete examination. Limited examinations for reoccurring indications may be performed as noted. The reflux portion of the exam is performed with the patient in reverse Trendelenburg.  +---------+---------------+---------+-----------+----------+--------------+ RIGHT    CompressibilityPhasicitySpontaneityPropertiesThrombus Aging +---------+---------------+---------+-----------+----------+--------------+ CFV      Full           Yes      Yes                                  +---------+---------------+---------+-----------+----------+--------------+ SFJ      Full                                                        +---------+---------------+---------+-----------+----------+--------------+ FV Prox  Full                                                        +---------+---------------+---------+-----------+----------+--------------+ FV Mid   Full                                                        +---------+---------------+---------+-----------+----------+--------------+ FV DistalFull                                                        +---------+---------------+---------+-----------+----------+--------------+  PFV      Full                                                        +---------+---------------+---------+-----------+----------+--------------+ POP      Full           Yes      Yes                                 +---------+---------------+---------+-----------+----------+--------------+ PTV      Full                                                        +---------+---------------+---------+-----------+----------+--------------+ PERO     Full                                                        +---------+---------------+---------+-----------+----------+--------------+ Thrombosed perforating vein noted with communication with the PTV.  +---------+---------------+---------+-----------+----------+--------------+ LEFT     CompressibilityPhasicitySpontaneityPropertiesThrombus Aging +---------+---------------+---------+-----------+----------+--------------+ CFV      Full           Yes      Yes                                 +---------+---------------+---------+-----------+----------+--------------+ SFJ      Full                                                        +---------+---------------+---------+-----------+----------+--------------+ FV Prox  Full                                                         +---------+---------------+---------+-----------+----------+--------------+ FV Mid   Full                                                        +---------+---------------+---------+-----------+----------+--------------+ FV DistalFull                                                        +---------+---------------+---------+-----------+----------+--------------+ PFV      Full                                                        +---------+---------------+---------+-----------+----------+--------------+  POP      Full           Yes      Yes                                 +---------+---------------+---------+-----------+----------+--------------+ PTV      Full                                                        +---------+---------------+---------+-----------+----------+--------------+ PERO     Full                                                        +---------+---------------+---------+-----------+----------+--------------+     Summary: RIGHT: - 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 - No cystic structure found in the popliteal fossa.  LEFT: - There is no evidence of deep vein thrombosis in the lower extremity.  - A cystic structure is found in the popliteal fossa.  *See table(s) above for measurements and observations.    Preliminary    Recent Labs    01/08/23 0705  WBC 9.6  HGB 11.8*  HCT 36.5  PLT 236    Recent Labs    01/08/23 0705  NA 137  K 3.9  CL 104  CO2 24  GLUCOSE 116*  BUN 26*  CREATININE 0.82  CALCIUM 8.7*     Intake/Output Summary (Last 24 hours) at 01/09/2023 0851 Last data filed at 01/09/2023 4034 Gross per 24 hour  Intake 678 ml  Output --  Net 678 ml         Physical Exam: Vital Signs Blood pressure (!) 105/44, pulse 72, temperature 97.6 F (36.4 C), temperature source Oral, resp. rate 18, height 5\' 2"  (1.575 m), weight 54.4 kg, SpO2 94 %.  General: Alert and oriented x 3, No  apparent distress.  Laying reclined in bed. HEENT: Head is normocephalic, atraumatic, PERRLA, EOMI, sclera anicteric, oral mucosa pink and moist, dentition intact.  Neck: Supple without JVD or lymphadenopathy Heart: Reg rate and rhythm. No murmurs rubs or gallops Chest: CTA bilaterally without wheezes, rales, or rhonchi; no distress Abdomen: Soft, non-tender, non-distended, bowel sounds positive. Extremities: No clubbing, cyanosis. RLE with 1+ edema. Pulses are 2+ Psych: Pt's affect is appropriate. Pt is cooperative Skin: Clean and intact without signs of breakdown, bruising right thigh. Post-op dressing in place with spots of old blood present -Diffuse bruising on right inner thigh marked with pen 6/8  Neuro:  Alert and oriented x 3. Normal insight and awareness. Intact Memory. Normal language and speech. Cranial nerve exam unremarkable. MMT: UE 5/5. LLE 4/5. RLE 2+ to 3/5 HF, 3+/5 KE and 4/5. No focal sensory findings.   Musculoskeletal:less swollen than assessment prior, minimally tender to palpation   Assessment/Plan: 1. Functional deficits which require 3+ hours per day of interdisciplinary therapy in a comprehensive inpatient rehab setting. Physiatrist is providing close team supervision and 24 hour management of active medical problems listed below. Physiatrist and rehab team continue to assess barriers to discharge/monitor patient progress toward functional and medical goals  Care Tool:  Bathing  Bathing activity did not occur: Refused           Bathing assist       Upper Body Dressing/Undressing Upper body dressing   What is the patient wearing?: Dress    Upper body assist Assist Level: Supervision/Verbal cueing    Lower Body Dressing/Undressing Lower body dressing      What is the patient wearing?: Underwear/pull up     Lower body assist Assist for lower body dressing: Moderate Assistance - Patient 50 - 74%     Toileting Toileting    Toileting assist  Assist for toileting: Moderate Assistance - Patient 50 - 74%     Transfers Chair/bed transfer  Transfers assist     Chair/bed transfer assist level: Minimal Assistance - Patient > 75%     Locomotion Ambulation   Ambulation assist      Assist level: Minimal Assistance - Patient > 75% Assistive device: Walker-rolling Max distance: 95'   Walk 10 feet activity   Assist     Assist level: Minimal Assistance - Patient > 75% Assistive device: Walker-rolling   Walk 50 feet activity   Assist Walk 50 feet with 2 turns activity did not occur: Safety/medical concerns  Assist level: Minimal Assistance - Patient > 75% Assistive device: Walker-rolling    Walk 150 feet activity   Assist Walk 150 feet activity did not occur: Safety/medical concerns         Walk 10 feet on uneven surface  activity   Assist Walk 10 feet on uneven surfaces activity did not occur: Safety/medical concerns         Wheelchair     Assist Is the patient using a wheelchair?: Yes Type of Wheelchair: Manual    Wheelchair assist level: Dependent - Patient 0% Max wheelchair distance: 10'    Wheelchair 50 feet with 2 turns activity    Assist        Assist Level: Dependent - Patient 0%   Wheelchair 150 feet activity     Assist      Assist Level: Dependent - Patient 0%   Blood pressure (!) 105/44, pulse 72, temperature 97.6 F (36.4 C), temperature source Oral, resp. rate 18, height 5\' 2"  (1.575 m), weight 54.4 kg, SpO2 94 %.  Medical Problem List and Plan: 1. Functional deficits secondary to right intertrochanteric femur fracture status post intramedullary fixation 01/01/2023.   -Weightbearing as tolerated             -patient may shower with surgical dressing covered             -ELOS/Goals: 18-21 days, SPV PT, Min A OT  -Patient is beginning CIR therapies today including PT and OT   -pt appears very motivated and wants to regain independence. Spoke with daughter  today.  2.  Antithrombotics: -DVT/anticoagulation:  Pharmaceutical: Lovenox check vascular study.  Patient can transition to aspirin therapy on discharge for DVT prophylaxis             -antiplatelet therapy: N/A 3. Pain Management: Hydrocodone/Robaxin as needed  -pain is well controlled 4. Mood/Behavior/Sleep: Provide emotional support             -antipsychotic agents: Geodon as needed  -pt is positive and in good spirits presently 5. Neuropsych/cognition: This patient is capable of making decisions on her own behalf. 6. Skin/Wound Care: Routine skin checks. Continue post-op dressing 7. Fluids/Electrolytes/Nutrition: encourage PO  -BUN sl increased. Discussed fluid intake with pt. Says she doesn't like water. Told  her to drink whatever she liked 8.  Acute blood loss anemia.  Follow-up hgb 11.8  -encourage appropriate dietary intake 9.  Postoperative delirium.  Improved.  Consider outpatient dementia screening.  -pt reports that period of confusion was very scary for her 10.  Hypertension.  Norvasc 5 mg daily, Toprol-XL 12.5 mg daily.  Monitor with increased mobility  -6/7 bp well controlled  - 6/8: Diastolic hypotension. DC norvasc, encourage fluids.   11.  History of left breast cancer.  Continue from area.  Follow-up outpatient oncology 12.  Prediabetes.  SSI.  Latest hemoglobin A1c 5.6-6.3.  -sugars all nearly normal--dc cbg checks and ssi 13.  History of interstitial cystitis with concurrent incontinence.  Admission urinalysis negative.  Pure wick discontinued  -pt is voiding continently 14.  Constipation.  MiraLAX daily, Senokot/Colace twice daily.   -LBM 6/5 by chart documentation.  - 6/8 - large BM 15.  Urge incontinence.  Not documented, patient endorsing at nighttime.  Check PVRs.  LOS: 2 days A FACE TO FACE EVALUATION WAS PERFORMED  Angelina Sheriff 01/09/2023, 8:51 AM

## 2023-01-10 NOTE — Progress Notes (Signed)
PROGRESS NOTE   Subjective/Complaints:  No events overnight.  Patient lying in bed, waiting for her son to visit. Hypotension improved with DC norvasc patient doing an excellent job with p.o. fluid intakes yesterday.  She states that she unfortunately tried to drink all 6 cups of water just before bed, was up all night with urinary urgency.  She is working on spacing them better today.   LBM 6/8 Overnight incontinence, low PVRs 101. She does note a longstanding history of intermittent incontinence and urinary strictures; has been sometime since she seen the urologist.  ROS:  + urge incontinence-ongoing + pain-improving Patient denies fever, rash, sore throat, blurred vision, dizziness, nausea, vomiting, diarrhea, cough, shortness of breath or chest pain, headache, or mood change.    Objective:   VAS [place] LOWER EXTREMITY VENOUS (DVT)  Result Date: 01/09/2023  Lower Venous DVT Study Patient Name:  [name] [name]  Date of Exam:   01/08/2023 Medical Rec #: [id number]         Accession #:    [id number] Date of Birth: 1932-02-01         Patient Gender: F Patient Age:   89 years Exam Location:  [facility] Procedure:      VAS [place] LOWER EXTREMITY VENOUS (DVT) Referring Phys: Mariam Dollar --------------------------------------------------------------------------------  Indications: Pain and swelling s/p right hip IM nail 01-01-2023.  Comparison Study: No prior studies. Performing Technologist: Jean Rosenthal RDMS, RVT  Examination Guidelines: A complete evaluation includes B-mode imaging, spectral Doppler, color Doppler, and power Doppler as needed of all accessible portions of each vessel. Bilateral testing is considered an integral part of a complete examination. Limited examinations for reoccurring indications may be performed as noted. The reflux portion of the exam is performed with the patient in reverse Trendelenburg.   +---------+---------------+---------+-----------+----------+--------------+ RIGHT    CompressibilityPhasicitySpontaneityPropertiesThrombus Aging +---------+---------------+---------+-----------+----------+--------------+ CFV      Full           Yes      Yes                                 +---------+---------------+---------+-----------+----------+--------------+ SFJ      Full                                                        +---------+---------------+---------+-----------+----------+--------------+ FV Prox  Full                                                        +---------+---------------+---------+-----------+----------+--------------+ FV Mid   Full                                                        +---------+---------------+---------+-----------+----------+--------------+  FV DistalFull                                                        +---------+---------------+---------+-----------+----------+--------------+ PFV      Full                                                        +---------+---------------+---------+-----------+----------+--------------+ POP      Full           Yes      Yes                                 +---------+---------------+---------+-----------+----------+--------------+ PTV      Full                                                        +---------+---------------+---------+-----------+----------+--------------+ PERO     Full                                                        +---------+---------------+---------+-----------+----------+--------------+ Thrombosed perforating vein noted with communication with the PTV.  +---------+---------------+---------+-----------+----------+--------------+ LEFT     CompressibilityPhasicitySpontaneityPropertiesThrombus Aging +---------+---------------+---------+-----------+----------+--------------+ CFV      Full           Yes      Yes                                  +---------+---------------+---------+-----------+----------+--------------+ SFJ      Full                                                        +---------+---------------+---------+-----------+----------+--------------+ FV Prox  Full                                                        +---------+---------------+---------+-----------+----------+--------------+ FV Mid   Full                                                        +---------+---------------+---------+-----------+----------+--------------+ FV DistalFull                                                        +---------+---------------+---------+-----------+----------+--------------+  PFV      Full                                                        +---------+---------------+---------+-----------+----------+--------------+ POP      Full           Yes      Yes                                 +---------+---------------+---------+-----------+----------+--------------+ PTV      Full                                                        +---------+---------------+---------+-----------+----------+--------------+ PERO     Full                                                        +---------+---------------+---------+-----------+----------+--------------+     Summary: RIGHT: - 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 - No cystic structure found in the popliteal fossa.  LEFT: - There is no evidence of deep vein thrombosis in the lower extremity.  - A cystic structure is found in the popliteal fossa.  *See table(s) above for measurements and observations. Electronically signed by Gerarda Fraction on 01/09/2023 at 9:38:34 AM.    Final    Recent Labs    01/08/23 0705  WBC 9.6  HGB 11.8*  HCT 36.5  PLT 236    Recent Labs    01/08/23 0705  NA 137  K 3.9  CL 104  CO2 24  GLUCOSE 116*  BUN 26*  CREATININE 0.82  CALCIUM 8.7*     Intake/Output Summary (Last  24 hours) at 01/10/2023 1014 Last data filed at 01/10/2023 0618 Gross per 24 hour  Intake 716 ml  Output 100 ml  Net 616 ml         Physical Exam: Vital Signs Blood pressure (!) 126/53, pulse 77, temperature 98.1 F (36.7 C), temperature source Oral, resp. rate 18, height 5\' 2"  (1.575 m), weight 54.4 kg, SpO2 94 %.  General: Alert and oriented x 3, No apparent distress.  Sitting upright in bed. HEENT: Head is normocephalic, atraumatic, PERRLA, EOMI, sclera anicteric, oral mucosa pink and moist, dentition intact.  Neck: Supple without JVD or lymphadenopathy Heart: Reg rate and rhythm. No murmurs rubs or gallops Chest: CTA bilaterally without wheezes, rales, or rhonchi; no distress Abdomen: Soft, non-tender, non-distended, bowel sounds positive. Extremities: No clubbing, cyanosis. RLE with 1+ edema -mildly improved from past exam. Pulses are 2+ Psych: Pt's affect is appropriate. Pt is cooperative.  Verbose. Skin: Clean and intact without signs of breakdown, bruising right thigh. Post-op dressing in place with spots of old blood present -Diffuse bruising on right inner thigh marked with pen 6/8-decreasing from margin 6-9  Neuro:  Alert and oriented x 3. Normal insight and awareness. Intact Memory. Normal language and speech. Cranial nerve exam unremarkable. MMT: UE 5/5. LLE 4/5.  RLE 3 to 4/5 HF, 4+/5 KE . No focal sensory findings.   Musculoskeletal: less swollen than assessment prior, minimally tender to palpation   Assessment/Plan: 1. Functional deficits which require 3+ hours per day of interdisciplinary therapy in a comprehensive inpatient rehab setting. Physiatrist is providing close team supervision and 24 hour management of active medical problems listed below. Physiatrist and rehab team continue to assess barriers to discharge/monitor patient progress toward functional and medical goals  Care Tool:  Bathing  Bathing activity did not occur: Refused           Bathing assist        Upper Body Dressing/Undressing Upper body dressing   What is the patient wearing?: Dress    Upper body assist Assist Level: Supervision/Verbal cueing    Lower Body Dressing/Undressing Lower body dressing      What is the patient wearing?: Underwear/pull up     Lower body assist Assist for lower body dressing: Moderate Assistance - Patient 50 - 74%     Toileting Toileting    Toileting assist Assist for toileting: Moderate Assistance - Patient 50 - 74%     Transfers Chair/bed transfer  Transfers assist     Chair/bed transfer assist level: Minimal Assistance - Patient > 75%     Locomotion Ambulation   Ambulation assist      Assist level: Minimal Assistance - Patient > 75% Assistive device: Walker-rolling Max distance: 95'   Walk 10 feet activity   Assist     Assist level: Minimal Assistance - Patient > 75% Assistive device: Walker-rolling   Walk 50 feet activity   Assist Walk 50 feet with 2 turns activity did not occur: Safety/medical concerns  Assist level: Minimal Assistance - Patient > 75% Assistive device: Walker-rolling    Walk 150 feet activity   Assist Walk 150 feet activity did not occur: Safety/medical concerns         Walk 10 feet on uneven surface  activity   Assist Walk 10 feet on uneven surfaces activity did not occur: Safety/medical concerns         Wheelchair     Assist Is the patient using a wheelchair?: Yes Type of Wheelchair: Manual    Wheelchair assist level: Dependent - Patient 0% Max wheelchair distance: 10'    Wheelchair 50 feet with 2 turns activity    Assist        Assist Level: Dependent - Patient 0%   Wheelchair 150 feet activity     Assist      Assist Level: Dependent - Patient 0%   Blood pressure (!) 126/53, pulse 77, temperature 98.1 F (36.7 C), temperature source Oral, resp. rate 18, height 5\' 2"  (1.575 m), weight 54.4 kg, SpO2 94 %.  Medical Problem List and  Plan: 1. Functional deficits secondary to right intertrochanteric femur fracture status post intramedullary fixation 01/01/2023.   -Weightbearing as tolerated             -patient may shower with surgical dressing covered             -ELOS/Goals: 18-21 days, SPV PT, Min A OT  -Patient is beginning CIR therapies today including PT and OT   -pt appears very motivated and wants to regain independence. Spoke with daughter today.  2.  Antithrombotics: -DVT/anticoagulation:  Pharmaceutical: Lovenox check vascular study.  Patient can transition to aspirin therapy on discharge for DVT prophylaxis             -antiplatelet therapy:  N/A 3. Pain Management: Hydrocodone/Robaxin as needed  -pain is well controlled 4. Mood/Behavior/Sleep: Provide emotional support             -antipsychotic agents: Geodon as needed  -pt is positive and in good spirits presently 5. Neuropsych/cognition: This patient is capable of making decisions on her own behalf. 6. Skin/Wound Care: Routine skin checks. Continue post-op dressing 7. Fluids/Electrolytes/Nutrition: encourage PO  -BUN sl increased. Discussed fluid intake with pt. Says she doesn't like water. Told her to drink whatever she liked 8.  Acute blood loss anemia.  Follow-up hgb 11.8  -encourage appropriate dietary intake 9.  Postoperative delirium.  Improved.  Consider outpatient dementia screening.  -pt reports that period of confusion was very scary for her 10.  Hypertension.  Norvasc 5 mg daily, Toprol-XL 12.5 mg daily.  Monitor with increased mobility  -6/7 bp well controlled  - 6/8: Diastolic hypotension. DC norvasc, encourage fluids.   -6-9: Blood pressure improved.  Patient doing very well with fluid intakes.  Monitor.  11.  History of left breast cancer.  Continue from area.  Follow-up outpatient oncology 12.  Prediabetes.  SSI.  Latest hemoglobin A1c 5.6-6.3.  -sugars all nearly normal--dc cbg checks and ssi 13.  History of interstitial cystitis with  concurrent incontinence.  Admission urinalysis negative.  Pure wick discontinued  -pt is voiding continently 14.  Constipation.  MiraLAX daily, Senokot/Colace twice daily.   -LBM 6/5 by chart documentation.  - 6/8 - large BM 15.  Urge incontinence.  Not documented, patient endorsing at nighttime.  Check PVRs.  -6/9: PVRs remain low.  Patient endorsing longstanding history of urethral strictures requiring intermittent dilation, has not seen the urologist for workup in quite some time.  Was never on prescribed medication, took an over-the-counter supplement for this.  Denies dysuria.  Encouraged patient to get outpatient follow-up with urology if symptoms are worsening, did briefly discuss possible medications but patient did not indicate interest in starting.  LOS: 3 days A FACE TO FACE EVALUATION WAS PERFORMED  Angelina Sheriff 01/10/2023, 10:14 AM

## 2023-01-10 NOTE — Progress Notes (Signed)
Physical Therapy Session Note  Patient Details  Name: [name] [name] MRN: [id number] Date of Birth: 1932-05-21  Today's Date: 01/10/2023 PT Individual Time: 0800-0856 PT Individual Time Calculation (min): 56 min   Short Term Goals: Week 1:  PT Short Term Goal 1 (Week 1): STG = LTG secondary to ELOS  Skilled Therapeutic Interventions/Progress Updates:      Pt supine in bed with HOB elevated upon arrival. Pt agreeable to therapy. Pt reports 5/10 R hip pain. Pt performed seated rest breaks and repositioning as needed for pain.   Pt performed supine<>sit with HOB elevated with supervision. Pt performed sit<>stand with RW and CGA from elevated hospital bed. Pt requesting to use bathroom. Pt ambulated 20 feet and performed ambulatory transfer to [facility] with RW and CGA. Pt donned and doffed pants with max A for time/energy conservation. Pt continent of bladder. Pt performed pericare with total assist.   Pt ambulated 2x98 with RW and CGA, verbal cues provided for safety with RW and reciprocal gait. Pt performed 1x10 sit<>stand with RW and CGA, verbal cues provided for UE positioning for safety.   Pt requesting to return to bed at end of session. Pt performed ambulatory transfer to bed with RW and CGA, verbal cues provided for use of UE for controlled descent.   Pt supine in bed at end of session with all needs within reach and seatbelt alarm on.   Therapy Documentation Precautions:  Precautions Precautions: Fall Precaution Comments: HOH Restrictions Weight Bearing Restrictions: Yes RLE Weight Bearing: Weight bearing as tolerated Other Position/Activity Restrictions: hearing impaired Therapy/Group: Individual Therapy  [facility] [place], [place], DPT  01/10/2023, 7:30 AM

## 2023-01-10 NOTE — Progress Notes (Signed)
Occupational Therapy Session Note  Patient Details  Name: [name] [name] MRN: [id number] Date of Birth: [birth year]  Today's Date: 01/10/2023 OT Individual Time: 1308-6578 and 1400-1510  OT Individual Time Calculation (min): 70 min and 70 min   Short Term Goals: Week 1:  OT Short Term Goal 1 (Week 1): STG=LTG d/t ELOS   Skilled Therapeutic Interventions/Progress Updates:    Session 1: Pt bed level at time of session with no pain at rest. Pt performing bed mobility with MIN A for supine > sit and functional mobility bed > bathroom > sink > wheelchair all with CGA and extended time, offloading LE with BUE support. Pt needing MOD A overall for toileting, educated on techniques and strategies to improve skill performance. Able to stand at sink for 3+ minutes for hand hygiene and grooming tasks with education on support at sink. Pt transported to/from ADL apartment and focused on problem solving as the pt states she sleeps on the sofa at home, also demonstrated and reviewed different potential adaptations to bathroom tub shower as the pt used to sit in the tub with a little water and wash, educated against that when going home. Transported back tor oom and walking short distance to bed CGA with RW, sit > supine MIN A as well. Note extended amount of time for all tasks as the pt is extremely verbose and difficulty redirecting. Alarm on call bell in reach.   Session 2: Pt bed level at time of session just finished toileting with nursing staff, no pain at rest. Pt with some discomfort in standing and walking during session but able to participate and rest breaks PRN. Pt performing supine > sit with MIN A cues for using bed features. Stand pivot bed > wheelchair and set up at sink for hair hygiene per pt request. OT assisting with hair washing, pt drying off hair with towel, standing for brushing hair and drying with blow dryer at this time, using sink for support. Pt taking extended time 2/2 verbose  tendencies. Focused on sit <> stands at [facility] with CGA throughout and CGA functional mobility throughout room and hall with wheelchair follow. Sit > supine with MIN A and educated on again bed features for pulling up on bed rails. Alarm on call bell in reach.   Therapy Documentation Precautions:  Precautions Precautions: Fall Precaution Comments: HOH Restrictions Weight Bearing Restrictions: Yes RLE Weight Bearing: Weight bearing as tolerated Other Position/Activity Restrictions: hearing impaired    Therapy/Group: Individual Therapy  Erasmo Score 01/10/2023, 11:58 AM

## 2023-01-11 DIAGNOSIS — K5900 Constipation, unspecified: Secondary | ICD-10-CM | POA: Diagnosis not present

## 2023-01-11 DIAGNOSIS — M25551 Pain in right hip: Secondary | ICD-10-CM

## 2023-01-11 DIAGNOSIS — D62 Acute posthemorrhagic anemia: Secondary | ICD-10-CM | POA: Diagnosis not present

## 2023-01-11 DIAGNOSIS — S72144D Nondisplaced intertrochanteric fracture of right femur, subsequent encounter for closed fracture with routine healing: Secondary | ICD-10-CM | POA: Diagnosis not present

## 2023-01-11 DIAGNOSIS — I10 Essential (primary) hypertension: Secondary | ICD-10-CM | POA: Diagnosis not present

## 2023-01-11 LAB — CBC
HCT: 33.9 % — ABNORMAL LOW (ref 36.0–46.0)
Hemoglobin: 10.7 g/dL — ABNORMAL LOW (ref 12.0–15.0)
MCH: 31.5 pg (ref 26.0–34.0)
MCHC: 31.6 g/dL (ref 30.0–36.0)
MCV: 99.7 fL (ref 80.0–100.0)
Platelets: 237 10*3/uL (ref 150–400)
RBC: 3.4 MIL/uL — ABNORMAL LOW (ref 3.87–5.11)
RDW: 14.2 % (ref 11.5–15.5)
WBC: 7.3 10*3/uL (ref 4.0–10.5)
nRBC: 0 % (ref 0.0–0.2)

## 2023-01-11 LAB — BASIC METABOLIC PANEL
Anion gap: 11 (ref 5–15)
BUN: 24 mg/dL — ABNORMAL HIGH (ref 8–23)
CO2: 23 mmol/L (ref 22–32)
Calcium: 8.5 mg/dL — ABNORMAL LOW (ref 8.9–10.3)
Chloride: 106 mmol/L (ref 98–111)
Creatinine, Ser: 0.85 mg/dL (ref 0.44–1.00)
GFR, Estimated: >60 mL/min (ref >60)
Glucose, Bld: 108 mg/dL — ABNORMAL HIGH (ref 70–99)
Potassium: 4.2 mmol/L (ref 3.5–5.1)
Sodium: 140 mmol/L (ref 135–145)

## 2023-01-11 NOTE — Progress Notes (Addendum)
PROGRESS NOTE   Subjective/Complaints:  She is anxious about going home and she would like to get better faster. Reports she continues to have hip pain but overall controlled and she is not requesting PRN medications.   ROS:  + urge incontinence-ongoing, denies recent change + pain-improving gradually Patient denies fever, rash, sore throat, blurred vision, dizziness, nausea, vomiting, diarrhea, cough, shortness of breath or chest pain, cough,  headache, or mood change.    Objective:   No results found. Recent Labs    01/11/23 0617  WBC 7.3  HGB 10.7*  HCT 33.9*  PLT 237    Recent Labs    01/11/23 0617  NA 140  K 4.2  CL 106  CO2 23  GLUCOSE 108*  BUN 24*  CREATININE 0.85  CALCIUM 8.5*     Intake/Output Summary (Last 24 hours) at 01/11/2023 0813 Last data filed at 01/10/2023 1855 Gross per 24 hour  Intake 472 ml  Output --  Net 472 ml         Physical Exam: Vital Signs Blood pressure (!) 140/60, pulse 71, temperature 97.7 F (36.5 C), temperature source Oral, resp. rate 16, height 5\' 2"  (1.575 m), weight 54.4 kg, SpO2 96 %.  General: Alert and oriented x 3, No apparent distress.  Sitting upright in bed. Daughters at bedside.  HEENT: , AT, MMM Neck: Supple without JVD or lymphadenopathy Heart: Reg rate and rhythm. No murmurs rubs or gallops Chest: CTAB Abdomen: Soft, non-tender, non-distended, bowel sounds positive- normoactive  Extremities: No clubbing, cyanosis. RLE with 1+ edema -mildly improved from past exam. Pulses are 2+ Psych: Pt's affect is appropriate. Pt is cooperative.  Verbose. Skin: Clean and intact without signs of breakdown, bruising right thigh. Post-op dressing in place with spots of old blood present -Diffuse bruising on right inner thigh marked with pen 6/8-decreasing from margin 6-9  Neuro:  Alert and oriented x 3. Normal insight and awareness. Intact Memory. Normal language  and speech. Cranial nerve exam unremarkable. MMT: UE 5/5. LLE 4/5. RLE 3 to 4/5 HF, 4+/5 KE . No focal sensory findings.   Musculoskeletal: less swollen than assessment prior, minimally tender to palpation   Assessment/Plan: 1. Functional deficits which require 3+ hours per day of interdisciplinary therapy in a comprehensive inpatient rehab setting. Physiatrist is providing close team supervision and 24 hour management of active medical problems listed below. Physiatrist and rehab team continue to assess barriers to discharge/monitor patient progress toward functional and medical goals  Care Tool:  Bathing  Bathing activity did not occur: Refused           Bathing assist       Upper Body Dressing/Undressing Upper body dressing   What is the patient wearing?: Dress    Upper body assist Assist Level: Supervision/Verbal cueing    Lower Body Dressing/Undressing Lower body dressing      What is the patient wearing?: Underwear/pull up     Lower body assist Assist for lower body dressing: Moderate Assistance - Patient 50 - 74%     Toileting Toileting    Toileting assist Assist for toileting: Moderate Assistance - Patient 50 - 74%  Transfers Chair/bed transfer  Transfers assist     Chair/bed transfer assist level: Minimal Assistance - Patient > 75%     Locomotion Ambulation   Ambulation assist      Assist level: Minimal Assistance - Patient > 75% Assistive device: Walker-rolling Max distance: 95'   Walk 10 feet activity   Assist     Assist level: Minimal Assistance - Patient > 75% Assistive device: Walker-rolling   Walk 50 feet activity   Assist Walk 50 feet with 2 turns activity did not occur: Safety/medical concerns  Assist level: Minimal Assistance - Patient > 75% Assistive device: Walker-rolling    Walk 150 feet activity   Assist Walk 150 feet activity did not occur: Safety/medical concerns         Walk 10 feet on uneven  surface  activity   Assist Walk 10 feet on uneven surfaces activity did not occur: Safety/medical concerns         Wheelchair     Assist Is the patient using a wheelchair?: Yes Type of Wheelchair: Manual    Wheelchair assist level: Dependent - Patient 0% Max wheelchair distance: 10'    Wheelchair 50 feet with 2 turns activity    Assist        Assist Level: Dependent - Patient 0%   Wheelchair 150 feet activity     Assist      Assist Level: Dependent - Patient 0%   Blood pressure (!) 140/60, pulse 71, temperature 97.7 F (36.5 C), temperature source Oral, resp. rate 16, height 5\' 2"  (1.575 m), weight 54.4 kg, SpO2 96 %.  Medical Problem List and Plan: 1. Functional deficits secondary to right intertrochanteric femur fracture status post intramedullary fixation 01/01/2023.   -Weightbearing as tolerated             -patient may shower with surgical dressing covered             -ELOS/Goals: 18-21 days, SPV PT, Min A OT  -Patient is beginning CIR therapies today including PT and OT   -pt appears very motivated and wants to regain independence. Spoke with daughter today.   -Consider consult Ortho for incision check and f/u of healing  -Team conference tomorrow  2.  Antithrombotics: -DVT/anticoagulation:  Pharmaceutical: Lovenox check vascular study.  Patient can transition to aspirin therapy on discharge for DVT prophylaxis             -antiplatelet therapy: N/A 3. Pain Management: Hydrocodone/Robaxin as needed  -pain is well controlled 4. Mood/Behavior/Sleep: Provide emotional support             -antipsychotic agents: Geodon as needed  -pt is positive and in good spirits presently  6/11 Hip pain overall controlled, she is not using PRN medications, discussed with here these are available if needed 5. Neuropsych/cognition: This patient is capable of making decisions on her own behalf. 6. Skin/Wound Care: Routine skin checks. Continue post-op dressing 7.  Fluids/Electrolytes/Nutrition: encourage PO  -BUN sl increased. Discussed fluid intake with pt. Says she doesn't like water. Told her to drink whatever she liked  -6/10 BUN a little better at 24, CR stable 0.85, encourage fluids  8.  Acute blood loss anemia.  Follow-up hgb 11.8  -encourage appropriate dietary intake  -HGB 10.7- stable overall, continue to monitor 9.  Postoperative delirium.  Improved.  Consider outpatient dementia screening.  -pt reports that period of confusion was very scary for her 10.  Hypertension.  Norvasc 5 mg daily, Toprol-XL 12.5 mg  daily.  Monitor with increased mobility  -6/7 bp well controlled  - 6/8: Diastolic hypotension. DC norvasc, encourage fluids.   -6/10 BP a little soft at times but stable overall, norvasc was stopped  6/11 monitor response to change in BP medications     01/11/2023    1:12 PM 01/11/2023    3:40 AM 01/10/2023    8:50 PM  Vitals with BMI  Systolic 102 140 161  Diastolic 49 60 38  Pulse 73 71 75     11.  History of left breast cancer.  Continue from area.  Follow-up outpatient oncology 12.  Prediabetes.  SSI.  Latest hemoglobin A1c 5.6-6.3.  -sugars all nearly normal--dc cbg checks and ssi  -6/11 Glu was 108 on labs yesterday, stable   13.  History of interstitial cystitis with concurrent incontinence.  Admission urinalysis negative.  Pure wick discontinued  -pt is voiding continently 14.  Constipation.  MiraLAX daily, Senokot/Colace twice daily.   -LBM 6/5 by chart documentation.  - 6/10 LBM today, continue to monitor -6/11 She denies feeling constipated today, continue current 15.  Urge incontinence.  Not documented, patient endorsing at nighttime.  Check PVRs.  -6/9: PVRs remain low.  Patient endorsing longstanding history of urethral strictures requiring intermittent dilation, has not seen the urologist for workup in quite some time.  Was never on prescribed medication, took an over-the-counter supplement for this.  Denies  dysuria.  Encouraged patient to get outpatient follow-up with urology if symptoms are worsening, did briefly discuss possible medications but patient did not indicate interest in starting. 16. Chronic insomnia  -She declines medication change, encourage sleep hygiene   LOS: 4 days A FACE TO FACE EVALUATION WAS PERFORMED  Fanny Dance 01/11/2023, 8:13 AM

## 2023-01-11 NOTE — Progress Notes (Signed)
Patient ID: [name] [name], female   DOB: [birth year], 89 y.o.   MRN: [id number] Met with the patient and daughter to review current situation, rehab process, team conference and plan of care. 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 Family has hired caregivers for spouse.  Reviewed medications, and dietary modification recommendations. Patient noted hip pain with activity but declines medications; "tough". Also noted poor sleep/insomnia but declines sleep aide; fall hazard in the past with confusion and does not want to repeat fall risk.  Family addressing measurements of bath and entry doorframe. Patient noted she does not feel ready to do much at the present and notes pain with therapy sessions even with weight bearing as tolerated. Continue to follow along to address educational needs to facilitate preparation for discharge. Pamelia Hoit

## 2023-01-11 NOTE — Progress Notes (Signed)
Physical Therapy Session Note  Patient Details  Name: [name] [name] MRN: [id number] Date of Birth: Jun 28, 1932  Today's Date: 01/11/2023 PT Individual Time: 0951-1100 PT Individual Time Calculation (min): 69 min   Short Term Goals: Week 1:  PT Short Term Goal 1 (Week 1): STG = LTG secondary to ELOS  Skilled Therapeutic Interventions/Progress Updates:     Pt seated in WC upon arrival. Pt agreeable to therapy. Pt reports 3/10 resting R hip pain, increasing to 8/10 with weightbearing but decreasing back to 3/10 with rest breaks. Therapist provided seated rest breaks and respoitioning as needed for pain.   Pt ascended/descended 8 3 inch steps with heavy B UE support and step to gait with min A, verbal cues provided for ascending leading with L LE and descending leading with R LE for pain management; pt ascended/descended 4 6 inch steps with heavy B UE support and step to gait, and verbal cues for up with the good, down with the bad with mod A for L LE advancement for ascending and min A for descending.   Pt performed sit<>stand with no AD and CGA/min A with verbal cues provided for technique, pt heavily favoring L LE.   Pt performed sit<>standweight shifting to R and L 1x20 bilaterally, with min-mod A, and verbal cues provided for forward gaze, and upright posture and keeping feet stationary.   Pt performed 1x10 R toe taps on 6 inch step with R HHA, verbal cues provided for light taps. Pt performed 1x5, 1x20  L LE toe taps on 6 inch step with R HHA and L UE support on chair positioned ot L of pt, with verbal and tactile cues provided for weight shift to R LE. Pt reports 6/10 pain with L toe taps, decreases to 3/10 with rest break.    Pt attempted sit<>stand with no AD  airex pad under L LE to improve weight bearing on R LE, with mod A for power up and trunk extension in standing; pt reports 9/10 pain; pt then performed sit<>stand x10 with RW with airex pad under L LE and CGA, verbal cues  provided R UE positioning on RW and L UE on mat table for power up and controlled descent, verbal and visual cues (mirror) provided for upright posture and forward gaze.   Pt requesting to use bahtroom.Pt performed stand pivot trasnfer RW and CGA, with decreased weight bearing through R LE 2/2 R LE pain and fear of buckling.  Pt transported dependent in [facility] for time and energy conservation. Pt ambulated 2x20 feet to/from bathroom. Pt donned and doffed diaper with max A while pt stood with RW with CGA, pt performed pericare with supervision while standing with CGA. Pt continent of bowel and bladder. Pt seated in WC at end of session with all needs wtihin reach and seatbelt alarm on.   Pt seated in WC at end of session with all needs within reach and seatbelt alarm on.      Therapy Documentation Precautions:  Precautions Precautions: Fall Precaution Comments: HOH Restrictions Weight Bearing Restrictions: Yes RLE Weight Bearing: Weight bearing as tolerated Other Position/Activity Restrictions: hearing impaired  Therapy/Group: Individual Therapy  [facility] Ambrose Finland, [place], DPT  01/11/2023, 7:35 AM

## 2023-01-11 NOTE — Progress Notes (Signed)
Occupational Therapy Session Note  Patient Details  Name: ASUNA PETH MRN: [id number] Date of Birth: 03/20/1932  Today's Date: 01/11/2023 OT Individual Time: 4098-1191 OT Individual Time Calculation (min): 70 min    Short Term Goals: Week 1:  OT Short Term Goal 1 (Week 1): STG=LTG d/t ELOS  Skilled Therapeutic Interventions/Progress Updates:    Pt received sitting in the w/c with no c/o pain at rest. Discussed fatigue from prior sessions. She was agreeable to take a shower after discussion re benefits and carryover to home. She completed sit > stand with (S) from the w/c. Functional mobility with the RW to the bathroom with close (S)- CGA. She completed toileting tasks with min A overall for clothing management. She transferred into the walk in shower with CGA and min cueing for sequencing. She required min A to doff clothing distally. She completed UB bathing seated with (S). She stood with (S) using B grab bars and then was too afraid to let go to attempt peri hygiene so OT completed. She returned to her wheelchair with the RW, CGA overall. She donned a new dress with (S). Pt was taken via w/c to the therapy gym for time management. Pt completed the BUE ergometer to challenge BUE strength and endurance needed to complete ADLs and IADLs with the highest level of independence. Pt completed 3 min forward and 4 minute backward with cueing for pacing and technique. She returned to her room and was left sitting up with all needs met, chair alarm set.   Therapy Documentation Precautions:  Precautions Precautions: Fall Precaution Comments: HOH Restrictions Weight Bearing Restrictions: Yes RLE Weight Bearing: Weight bearing as tolerated Other Position/Activity Restrictions: hearing impaired  Therapy/Group: Individual Therapy  Crissie Reese 01/11/2023, 12:18 PM

## 2023-01-11 NOTE — Progress Notes (Signed)
Physical Therapy Session Note  Patient Details  Name: [name] [name] MRN: [id number] Date of Birth: 1931/09/13  Today's Date: 01/11/2023 PT Individual Time: 0831-0929 PT Individual Time Calculation (min): 58 min   Short Term Goals: Week 1:  PT Short Term Goal 1 (Week 1): STG = LTG secondary to ELOS  Skilled Therapeutic Interventions/Progress Updates:    Pt presents in room in [facility] with RN present administering meds, pt agreeable to PT. Pt denies pain at this time. Session focused on standing tolerance, dynamic standing balance, activity tolerance, and gait training with RW. Pt completes sit<>stand to RW with supervision/CGA throughout session.  Pt requesting to get washed up prior to leaving room, therapist facilitates pt standing at sink to wash up with warm wash rags with unilateral and no UE support for a total of 9 minutes, completed to promote improved standing tolerance, weightshifting, tolerance to RLE weightbearing, and dynamic standing balance with dynamic UE movements. Pt manages doffing/donning dressing gowns in standing with supervision/CGA. Pt requires verbal cues for attendance to task with pt easily distractible.  Pt transported from room to main gym in [facility] dependently for time management. Pt completes gait training 190' with RW CGA/supervision demonstrates improved step through gait pattern with verbal cues for attendance to task with pt stopping frequently while talking, verbal cues for increasing step length with LLE to increase RLE stance time.  Pt then completes up/down 8 3" steps with BHRs, verbal cues for step to gait pattern leading with LLE ascending however pt completes reciprocal pattern with min assist for stability. Pt provided with verbal cues for step to gait pattern with descending to decrease pain with pt demonstrating understanding.  Pt requires increased time for rest breaks between gait trial and stair negotiation training for energy conservation and quality with  task.  Pt returned to room dependently in [facility] and remains seated with all needs within reach, call light in place, and chair alarm donned and activated at end of session.  Therapy Documentation Precautions:  Precautions Precautions: Fall Precaution Comments: HOH Restrictions Weight Bearing Restrictions: Yes RLE Weight Bearing: Weight bearing as tolerated Other Position/Activity Restrictions: hearing impaired   Therapy/Group: Individual Therapy  Edwin Cap PT, DPT 01/11/2023, 12:17 PM

## 2023-01-12 DIAGNOSIS — R7303 Prediabetes: Secondary | ICD-10-CM

## 2023-01-12 DIAGNOSIS — M25551 Pain in right hip: Secondary | ICD-10-CM | POA: Diagnosis not present

## 2023-01-12 DIAGNOSIS — I10 Essential (primary) hypertension: Secondary | ICD-10-CM | POA: Diagnosis not present

## 2023-01-12 DIAGNOSIS — S72144D Nondisplaced intertrochanteric fracture of right femur, subsequent encounter for closed fracture with routine healing: Secondary | ICD-10-CM | POA: Diagnosis not present

## 2023-01-12 DIAGNOSIS — K5900 Constipation, unspecified: Secondary | ICD-10-CM | POA: Diagnosis not present

## 2023-01-12 NOTE — Progress Notes (Signed)
Physical Therapy Session Note  Patient Details  Name: [name] [name] MRN: [id number] Date of Birth: 08-15-1931  Today's Date: 01/12/2023 PT Individual Time: 4098-1191, 4782-9562 PT Individual Time Calculation (min): 75 min, 59 min   Short Term Goals: Week 1:  PT Short Term Goal 1 (Week 1): STG = LTG secondary to ELOS  Skilled Therapeutic Interventions/Progress Updates:      Treatment Session 1   Pt supine in bed with nurse in room upon arrival. Pt agreeable to therapy. Pt reports 2/10 resting pain, and 6/10 R LE pain with weightbearing. Therapist provided pt with seated rest breaks and repositioning as needed for pain.  Pt performed supine to sit with mod I with increased time, utilizing UE for R LE. Pt   Ambulated with RW and CGA and step to gait with CGA to bathroom room. Pt performed ambulatory transfer to toilet with CGA. Pt donned and doffed pants with CGA with unilateral UE support on RW. Pt performed pericare with max A while pt stood with RW and CGA.   Pt ambulated 2x130 feet with RW and CGA progressing to supervision and heavy UE on RW, verbal cues provided for reciprocal gait and forward gaze. Seated rest break provided between trials 2/2 pain and fatigue.   Pt performed 2x20 feet of the following activities with B UE support and CGA, with heavy B UE support, verbal cues provided for upright posture, forward gaze:   R lateral steps  L lateral steps  Forward steps  Backward steps  6 inch hurdles leading with R LE  6 inch hurdles leading with L LE  Pt transported dependent in WC to room. Pt seated in WC at end of session with all needs within reach and seatbelt alarm on.     Treatment Session 2  Pt seated in [facility] upon arrival with both sisters in room upon arrival. Pt agreeable to therapy. Pt reports 2/10 resting R LE pain and 8/10 weightbearing R LE pain.   Pt ambulated 1x220 feet with RW and CGA progressing to supervision with verbal cues provided for reciprocal gait  and upright gaze.  Pt performed sit<>stand with RW and CGA, pt performed the following activities to increase weightbearing on R LE and reduce need for B UE support.   Pt reached across body with L UE and placed squigy on mirror positioned to R of pt with CGA, verbal cues provided to keep feet stationary. Pt removed squigys from mirror with R UE and reached across body to place them in container positioned to L of patient. Pt clapped x15 with B UE support and intermittent use of B UE on RW for stabilization with min A for stabilization 2/2 posterior sway, verbal cues for leaning forward.   Pt reached forward with B UE support to unstack and stack cones 3x5 with CGA, pt demos decreased posterior sway. Pt reached across body with L UE to place cone on table positioned to R of pt with RW positioned in front of pt for pt comfort but no UE support on RW, verbal cues provided for maintenance of no UE support on RW and keeping feet stationary.   Pt performed 1x10 B SAQ, 1x10 R SLR and 1x10 bridinging for B UE strengthening.   Pt transported dependent in [facility] to room for time/energy conservation. Pt performed stand pivot trasnfer with RW and supervision. Pt supine in bed with HOB elevated and needs within reach at end of session    Therapy Documentation Precautions:  Precautions  Precautions: Fall Precaution Comments: HOH Restrictions Weight Bearing Restrictions: Yes RLE Weight Bearing: Weight bearing as tolerated Other Position/Activity Restrictions: hearing impaired Therapy/Group: Individual Therapy  [facility] Ambrose Finland, [place], DPT  01/12/2023, 7:46 AM

## 2023-01-12 NOTE — Progress Notes (Signed)
Occupational Therapy Session Note  Patient Details  Name: NICOLETTE GIESKE MRN: [id number] Date of Birth: [birth year]  Today's Date: 01/12/2023 OT Individual Time: 0945-1100 OT Individual Time Calculation (min): 75 min    Short Term Goals: Week 1:  OT Short Term Goal 1 (Week 1): STG=LTG d/t ELOS  Skilled Therapeutic Interventions/Progress Updates:   Pt seen for skilled OT session this am. 2 dtrs in room and eager for input. Dtr passed OT the Home Measurement Sheet filled out and showed actual photos of pt's bathroom and tub shower space. OT addressed full am self care, toileting and mobility routine as follows: S/set up grooming, UB bathing and dressing, max A LB bathing and dressing with LH sponge issued and intro to AE with + receptivity, cues for hand placement for transfers, RW access to 3 in 1 over toilet wtih CGA, TTB in stall shower with min A. 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 OT rec 3 in 1, TTB and family will order a hip kit and HH shower hose, rec HHOT and progress family educ and use of AE and OT filled in Equipment sticky for MSW and team coord. Left pt at end of session with chair alarm active    Pain: 1/10 on R hip at rest, 6/10 with amb with rest, repositioning, pain meds given earlier    Therapy Documentation Precautions:  Precautions Precautions: Fall Precaution Comments: HOH Restrictions Weight Bearing Restrictions: Yes RLE Weight Bearing: Weight bearing as tolerated Other Position/Activity Restrictions: hearing impaired   Therapy/Group: Individual Therapy  Vicenta Dunning 01/12/2023, 7:35 AM

## 2023-01-12 NOTE — Progress Notes (Signed)
PROGRESS NOTE   Subjective/Complaints:  She is anxious about going home and she would like to get better faster. Reports she continues to have hip pain but overall controlled and she is not requesting PRN medications.   ROS:  + urge incontinence-ongoing, denies recent change + pain-improving gradually Patient denies fever, rash, sore throat, blurred vision, dizziness, nausea, vomiting, diarrhea, cough, shortness of breath or chest pain, cough,  headache, or mood change.    Objective:   No results found. Recent Labs    01/11/23 0617  WBC 7.3  HGB 10.7*  HCT 33.9*  PLT 237    Recent Labs    01/11/23 0617  NA 140  K 4.2  CL 106  CO2 23  GLUCOSE 108*  BUN 24*  CREATININE 0.85  CALCIUM 8.5*     Intake/Output Summary (Last 24 hours) at 01/12/2023 1104 Last data filed at 01/12/2023 0811 Gross per 24 hour  Intake 1467 ml  Output 300 ml  Net 1167 ml         Physical Exam: Vital Signs Blood pressure 123/68, pulse 81, temperature 98 F (36.7 C), temperature source Oral, resp. rate 17, height 5\' 2"  (1.575 m), weight 54.4 kg, SpO2 96 %.  General: Alert and oriented x 3, No apparent distress.  Sitting upright in bed. Daughters at bedside.  HEENT: [place], AT, MMM Neck: Supple without JVD or lymphadenopathy Heart: Reg rate and rhythm. No murmurs rubs or gallops Chest: CTAB Abdomen: Soft, non-tender, non-distended, bowel sounds positive- normoactive  Extremities: No clubbing, cyanosis. RLE with 1+ edema -mildly improved from past exam. Pulses are 2+ Psych: Pt's affect is appropriate. Pt is cooperative.  Verbose. Skin: Clean and intact without signs of breakdown, bruising right thigh. Post-op dressing in place with spots of old blood present -Diffuse bruising on right inner thigh marked with pen 6/8-decreasing from margin 6-9  Neuro:  Alert and oriented x 3. Normal insight and awareness. Intact Memory. Normal language  and speech. Cranial nerve exam unremarkable. MMT: UE 5/5. LLE 4/5. RLE 3 to 4/5 HF, 4+/5 KE . No focal sensory findings.   Musculoskeletal: less swollen than assessment prior, minimally tender to palpation   Assessment/Plan: 1. Functional deficits which require 3+ hours per day of interdisciplinary therapy in a comprehensive inpatient rehab setting. Physiatrist is providing close team supervision and 24 hour management of active medical problems listed below. Physiatrist and rehab team continue to assess barriers to discharge/monitor patient progress toward functional and medical goals  Care Tool:  Bathing  Bathing activity did not occur: Refused           Bathing assist       Upper Body Dressing/Undressing Upper body dressing   What is the patient wearing?: Dress    Upper body assist Assist Level: Supervision/Verbal cueing    Lower Body Dressing/Undressing Lower body dressing      What is the patient wearing?: Underwear/pull up     Lower body assist Assist for lower body dressing: Moderate Assistance - Patient 50 - 74%     Toileting Toileting    Toileting assist Assist for toileting: Moderate Assistance - Patient 50 - 74%  Transfers Chair/bed transfer  Transfers assist     Chair/bed transfer assist level: Minimal Assistance - Patient > 75%     Locomotion Ambulation   Ambulation assist      Assist level: Minimal Assistance - Patient > 75% Assistive device: Walker-rolling Max distance: 95'   Walk 10 feet activity   Assist     Assist level: Minimal Assistance - Patient > 75% Assistive device: Walker-rolling   Walk 50 feet activity   Assist Walk 50 feet with 2 turns activity did not occur: Safety/medical concerns  Assist level: Minimal Assistance - Patient > 75% Assistive device: Walker-rolling    Walk 150 feet activity   Assist Walk 150 feet activity did not occur: Safety/medical concerns         Walk 10 feet on uneven  surface  activity   Assist Walk 10 feet on uneven surfaces activity did not occur: Safety/medical concerns         Wheelchair     Assist Is the patient using a wheelchair?: Yes Type of Wheelchair: Manual    Wheelchair assist level: Dependent - Patient 0% Max wheelchair distance: 10'    Wheelchair 50 feet with 2 turns activity    Assist        Assist Level: Dependent - Patient 0%   Wheelchair 150 feet activity     Assist      Assist Level: Dependent - Patient 0%   Blood pressure 123/68, pulse 81, temperature 98 F (36.7 C), temperature source Oral, resp. rate 17, height 5\' 2"  (1.575 m), weight 54.4 kg, SpO2 96 %.  Medical Problem List and Plan: 1. Functional deficits secondary to right intertrochanteric femur fracture status post intramedullary fixation 01/01/2023.   -Weightbearing as tolerated             -patient may shower with surgical dressing covered             -ELOS/Goals: 18-21 days, SPV PT, Min A OT  -Patient is beginning CIR therapies today including PT and OT   -pt appears very motivated and wants to regain independence. Spoke with daughter today.   -Consider consult Ortho for incision check and f/u of healing  -Team conference tomorrow  2.  Antithrombotics: -DVT/anticoagulation:  Pharmaceutical: Lovenox check vascular study.  Patient can transition to aspirin therapy on discharge for DVT prophylaxis             -antiplatelet therapy: N/A 3. Pain Management: Hydrocodone/Robaxin as needed  -pain is well controlled 4. Mood/Behavior/Sleep: Provide emotional support             -antipsychotic agents: Geodon as needed  -pt is positive and in good spirits presently  6/11 Hip pain overall controlled, she is not using PRN medications, discussed with here these are available if needed 5. Neuropsych/cognition: This patient is capable of making decisions on her own behalf. 6. Skin/Wound Care: Routine skin checks. Continue post-op dressing 7.  Fluids/Electrolytes/Nutrition: encourage PO  -BUN sl increased. Discussed fluid intake with pt. Says she doesn't like water. Told her to drink whatever she liked  -6/10 BUN a little better at 24, CR stable 0.85, encourage fluids  8.  Acute blood loss anemia.  Follow-up hgb 11.8  -encourage appropriate dietary intake  -HGB 10.7- stable overall, continue to monitor 9.  Postoperative delirium.  Improved.  Consider outpatient dementia screening.  -pt reports that period of confusion was very scary for her 10.  Hypertension.  Norvasc 5 mg daily, Toprol-XL 12.5 mg daily.  Monitor with increased mobility  -6/7 bp well controlled  - 6/8: Diastolic hypotension. DC norvasc, encourage fluids.   -6/10 BP a little soft at times but stable overall, norvasc was stopped  6/11 monitor response to change in BP medications     01/12/2023    4:38 AM 01/11/2023    6:28 PM 01/11/2023    1:12 PM  Vitals with BMI  Systolic 123 120 562  Diastolic 68 45 49  Pulse  81 73     11.  History of left breast cancer.  Continue from area.  Follow-up outpatient oncology 12.  Prediabetes.  SSI.  Latest hemoglobin A1c 5.6-6.3.  -sugars all nearly normal--dc cbg checks and ssi  -6/11 Glu was 108 on labs yesterday, stable   13.  History of interstitial cystitis with concurrent incontinence.  Admission urinalysis negative.  Pure wick discontinued  -pt is voiding continently 14.  Constipation.  MiraLAX daily, Senokot/Colace twice daily.   -LBM 6/5 by chart documentation.  - 6/10 LBM today, continue to monitor -6/11 She denies feeling constipated today, continue current 15.  Urge incontinence.  Not documented, patient endorsing at nighttime.  Check PVRs.  -6/9: PVRs remain low.  Patient endorsing longstanding history of urethral strictures requiring intermittent dilation, has not seen the urologist for workup in quite some time.  Was never on prescribed medication, took an over-the-counter supplement for this.  Denies  dysuria.  Encouraged patient to get outpatient follow-up with urology if symptoms are worsening, did briefly discuss possible medications but patient did not indicate interest in starting. 16. Chronic insomnia  -She declines medication change, encourage sleep hygiene   LOS: 5 days A FACE TO FACE EVALUATION WAS PERFORMED  Fanny Dance 01/12/2023, 11:04 AM

## 2023-01-13 DIAGNOSIS — N3941 Urge incontinence: Secondary | ICD-10-CM

## 2023-01-13 DIAGNOSIS — S72144D Nondisplaced intertrochanteric fracture of right femur, subsequent encounter for closed fracture with routine healing: Secondary | ICD-10-CM | POA: Diagnosis not present

## 2023-01-13 DIAGNOSIS — M25551 Pain in right hip: Secondary | ICD-10-CM | POA: Diagnosis not present

## 2023-01-13 DIAGNOSIS — K5900 Constipation, unspecified: Secondary | ICD-10-CM | POA: Diagnosis not present

## 2023-01-13 DIAGNOSIS — I10 Essential (primary) hypertension: Secondary | ICD-10-CM | POA: Diagnosis not present

## 2023-01-13 MED ORDER — OXYBUTYNIN CHLORIDE 5 MG PO TABS
2.5000 mg | ORAL_TABLET | Freq: Two times a day (BID) | ORAL | Status: DC
  Administered 2023-01-13 – 2023-01-14 (×3): 2.5 mg via ORAL
  Filled 2023-01-13 (×4): qty 0.5

## 2023-01-13 MED ORDER — ASPIRIN 81 MG PO TBEC
81.0000 mg | DELAYED_RELEASE_TABLET | Freq: Two times a day (BID) | ORAL | Status: DC
  Administered 2023-01-13 – 2023-01-20 (×14): 81 mg via ORAL
  Filled 2023-01-13 (×14): qty 1

## 2023-01-13 NOTE — Progress Notes (Signed)
Patient ID: [name] [name], female   DOB: 08-10-1931, 89 y.o.   MRN: [id number]  met with pt and spoke with Andy-son via telephone to give team conference update regarding Mom's goals of supervision-mod/I and discharge date of 6/17. He has hired a caregiver from [employer]  12-6 pm for both pt and husband at home. Discussed she needs assist with bathing and dressing along with stairs navigation. He will let the CNA know regarding this. Offered to have him come in for education prior to discharge and he reports his sister;s have been here and seen and he feels he can;t work it in. Discussed DME and home health follow up no preference for either. Will continue to work on discharge needs.

## 2023-01-13 NOTE — Patient Care Conference (Signed)
Inpatient RehabilitationTeam Conference and Plan of Care Update Date: 01/13/2023   Time: 12:12 PM    Patient Name: [name] [name]      Medical Record Number: [id number]  Date of Birth: 1931/09/29 Sex: Female         Room/Bed: 4M10C/4M10C-01 Payor Info: Payor: [employer] MEDICARE / Plan: BCBS MEDICARE / Product Type: *No Product type* /    Admit Date/Time:  01/07/2023 12:29 PM  Primary Diagnosis:  Intertrochanteric fracture of right hip [facility])  Hospital Problems: Principal Problem:   Intertrochanteric fracture of right hip [facility])    Expected Discharge Date: Expected Discharge Date: 01/18/23  Team Members Present: Physician leading conference: Dr. Fanny Dance Social Worker Present: Dossie Der, LCSW Nurse Present: Chana Bode, RN PT Present: Ambrose Finland, PT OT Present: Lou [place], OT     Current Status/Progress Goal Weekly Team Focus  Bowel/Bladder   pt continent of b/b with urgency   Remain continent   Assist qshift and prn    Swallow/Nutrition/ Hydration               ADL's   S/set up grooming, UB bathing and dressing, max A LB bathing and dresisng wiht LH sponge issued and intro to AE with + receptivity, cues for hand placement for transfers, RW access to 3 in 1 over toilet wtih CGA, TTB in stall shower with min A   S   family brought in home measurement sheet, OT rec 3 in 1, TTB and family will order a hip kit and HH shower hose, rec HHOT and progress family educ and use of AE    Mobility   bed mobility supervision to min A for R LE, sit to stand and stand pivot with RW and supervision, amb 220 feet with RW and supervision, stairs with B UE on HR per home set up and min A   mod I  barriers to discharge: min A for stair navigation, limited family support, measuring height of bed as pt has difficulty with apartment bed    Communication                Safety/Cognition/ Behavioral Observations               Pain   no c/o pain    remain pain free   Assess qshift and prn    Skin   Skin intact. R side surgical incision, Right side bruise inner thigh, generalized bruising   maintain skin integrity  Assess qshift and prn      Discharge Planning:  Home with husband who can only provide supervision only. Daughter flying back to [place] and other daughter coming in. Son has hired some assist for Dad who is at home for home management for him.   Team Discussion: Patient with chronic urinary issues; limited by pain post right hip fracture.  Patient on target to meet rehab goals: yes, currently needs min assist for stair management but able to ambulate up to 220' using a RW. Completes upper body care with set up assist and needs max assist for lower body care.  *See Care Plan and progress notes for long and short-term goals.   Revisions to Treatment Plan:  N/a  Teaching Needs: Safety, skin care, medications, transfers, toileting, etc.   Current Barriers to Discharge: Decreased caregiver support and Home enviroment access/layout  Possible Resolutions to Barriers: Family education HH follow up services DME: TTB, 3N1     Medical Summary Current Status: R  hip fracture, Bun elevated, HTN, urge incontinence, abla  Barriers to Discharge: Incontinence;Medical stability  Barriers to Discharge Comments: R hip fracture, Bun elevated, HTN, urge incontinence, abla Possible Resolutions to [employer] Focus: oxybutynin started, monitor BP, follow CBC/BMP   Continued Need for Acute Rehabilitation Level of Care: The patient requires daily medical management by a physician with specialized training in physical medicine and rehabilitation for the following reasons: Direction of a multidisciplinary physical rehabilitation program to maximize functional independence : Yes Medical management of patient stability for increased activity during participation in an intensive rehabilitation regime.: Yes Analysis of laboratory  values and/or radiology reports with any subsequent need for medication adjustment and/or medical intervention. : Yes   I attest that I was present, lead the team conference, and concur with the assessment and plan of the team.   Chana Bode B 01/13/2023, 2:01 PM

## 2023-01-13 NOTE — Progress Notes (Addendum)
Physical Therapy Session Note  Patient Details  Name: [name] [name] MRN: [id number] Date of Birth: 04-16-1932  Today's Date: 01/13/2023 PT Individual Time: 4098-1191, 4782-9562 PT Individual Time Calculation (min): 60 min, 44 min   Short Term Goals: Week 1:  PT Short Term Goal 1 (Week 1): STG = LTG secondary to ELOS  Skilled Therapeutic Interventions/Progress Updates:      Treatment Session 1  Pt seated in [facility] upon arrival with both daughters in room. Pt agreeable to therapy. Pt reports 8/10 R LE pain with weightbearing. Therapist provided seated rest break and repositioning as needed. Pt daughters present throughout session. Education provided on mod I/supervision  goals, and plan of care. Discussed pt son coming for family training as daughters live out of state, and son is primary caregiver. Education provided on stair navigation with B UE support and min A, education provided to pt and family that pt will need assistance getting in and out of house, pt and pt family verbalized understanding. Family reports plan is to hire sitter throughout day to provide assistance.   Pt ambulated 20 feet with RW and supervision to bathroom, pt performed ambulatory transfer to toilet, and donned mesh underwear with intermittent UE support on RW and CGA for stability, verbal cues provided to reduce postural lean. Pt continent of bladder. Pt doffed pants with mod A, with unilateral UE support on RW.   Pt daughter reports pt has bilateral handrails at home, but unable to reach both at same time. Pt ascended/descended 6 steps with B UE support on R ascending handrail and min A, with heavy UE support and pt reports fear of R LE buckling especially with ascending, but did not demo any buckling.   Pt ascended/descended 2 steps with B UE support on L ascending handrail with mod A, discontinued activity.   Pt performed stand pivot transfer to apartment bed with min A for stand <>sit 2/2 short height. Pt  performed rolling with mod I and supine to sit with CGA. Discussed family measuring height of bed. Plan to practice future session with hospital bed at height of bed, and with use of stool if needed.   Pt ambulated 150 feet to room with RW and supervision, pt demos improved recall of reciprocal gait and forward gaze, verbal cues provided for heel/toe pattern. Pt performed ambulatory transfer with RW and supervision. Pt performed sit<>supine with supervision, with use of gaitbelt as leg lifter as pt unable to lift into bed with hand today.   Pt supine in bed with all needs within reach and bed alarm on at end of session with daughters in room.    Treatment Session 2   Pt seated on toilet upon arrival. Pt agreeable to therapy. Pt reports 8/10 R hip pain. Therapist provided seated rest breaks and repositioning as needed for pain. Nurse administered pain mediciation. Tech provided ice pack to R hip at end of session.   Pt continent of bowel and bladder. Pt performed sit<>stand with RW and supervision, pt stood with CGA while therapist donned/adjusted diaper.   Pt transported dependent in [facility] to main gym for pain management. Discussed pt entry to bathroom is 21 3/4 inches wide; pt performed lateral stepping 1x15 feet in each direction for home bathroom set up with RW and CGA, verbal cues provided for safety with RW. Social worker reports pt son has schedule for pt to have sitter 6 hours of day, discussed pt utilizing BSC, and having sitter empty BSC especially at night  or with incontinence. Pt verbalized understanding.   Pt performed stand pivot transfer to car simjulator at height of toyota camry with RW and supervision, verbal cues provided for technique.   Pt seated in WC at end of session with all needs within reach and seatbelt alarm on.   Therapy Documentation Precautions:  Precautions Precautions: Fall Precaution Comments: HOH Restrictions Weight Bearing Restrictions: Yes RLE Weight  Bearing: Weight bearing as tolerated Other Position/Activity Restrictions: hearing impaired  Therapy/Group: Individual Therapy  [facility] Ambrose Finland, [place], DPT  01/13/2023, 7:36 AM

## 2023-01-13 NOTE — Progress Notes (Signed)
Spoke with [facility] physician assistant for Dr. Linna Caprice in regards to follow-up after hip fracture.  Advised discontinue staples apply dry dressing if needed.  Patient would follow-up outpatient with orthopedic services.

## 2023-01-13 NOTE — Progress Notes (Signed)
Occupational Therapy Session Note  Patient Details  Name: [name] MRN: [id number] Date of Birth: 12/09/1931  Today's Date: 01/13/2023 OT Individual Time: 800-900 Session 1, 1445-1526 Session 2 OT Individual Time Calculation (min): 60 min, 41 min    Short Term Goals: Week 1:  OT Short Term Goal 1 (Week 1): STG=LTG d/t ELOS  Skilled Therapeutic Interventions/Progress Updates:   Session 1: Pt seen for skilled OT session with pt requesting shower retraining upon OT arrival. Pt bed level upon OT arrival. Pt moved to EOB with CGA. Amb with RW with CGA to TTB set in stall shower. Pt performed UB bathing with min A and LB bathing with mod A using LH sponge and grab bar for intermittent supported standing to bathe peri and buttocks region while seated.  W/c level dressing training set up sink side with cues to don all LB garments before standing all at once, use of sock aide and reacher. Needed mod A overall. UB pull over dressing gown with CGA. Stood for oral and hair care with CGA.  Pt set up in w/c with chair alarm active, needs and nurse call button in reach with dtrs arriving for a visit.   Pain:  R hip incisional pain 2/10 with rest and repositioning       Session 2:  Pt seen for skilled  OT session with focus on TTB training in demo apt space. Pt transported via w/c for time mngt. Amb with RW from doorway of apt to and from bathroom with CGA. On and off TTB with increased time and effort but CGA only with min cues for hand placement. Pt even able to complete R LE mngt in and out over tub lip. Once back in room, pt amb with RW to recliner for 1st trial. Transferred in with CGA. OT educated on positioning, wight shifts for skin protection and recline and elevation mechanism. Pt reported comfort and hand off to NT for vitals.   Pain: R LE 1/10 with elevation in recliner at end of session for relief    Therapy Documentation Precautions:  Precautions Precautions: Fall Precaution  Comments: HOH Restrictions Weight Bearing Restrictions: Yes RLE Weight Bearing: Weight bearing as tolerated Other Position/Activity Restrictions: hearing impaired   Therapy/Group: Individual Therapy  Vicenta Dunning 01/13/2023, 2:45 PM

## 2023-01-13 NOTE — Progress Notes (Addendum)
PROGRESS NOTE   Subjective/Complaints:  She reports she continues to have chronic urge incontinence. No additional concerns this AM.   ROS:  + urge incontinence-ongoing, denies recent change + pain-improving gradually Patient denies fever, nausea, vomiting, diarrhea, constipation, cough, shortness of breath or chest pain, cough,  headache, or mood change.    Objective:   No results found. Recent Labs    01/11/23 0617  WBC 7.3  HGB 10.7*  HCT 33.9*  PLT 237    Recent Labs    01/11/23 0617  NA 140  K 4.2  CL 106  CO2 23  GLUCOSE 108*  BUN 24*  CREATININE 0.85  CALCIUM 8.5*     Intake/Output Summary (Last 24 hours) at 01/13/2023 0839 Last data filed at 01/13/2023 0816 Gross per 24 hour  Intake 600 ml  Output --  Net 600 ml         Physical Exam: Vital Signs Blood pressure 121/69, pulse 80, temperature (!) 97.5 F (36.4 C), temperature source Oral, resp. rate 16, height 5\' 2"  (1.575 m), weight 54.4 kg, SpO2 98 %.  General: Alert and oriented x 3, No apparent distress.  Sitting upright in bed. Daughters at bedside.  HEENT: [place], AT, MMM Neck: Supple without JVD or lymphadenopathy Heart: Reg rate and rhythm.  Chest: non-labored Abdomen: Soft, non-tender, non-distended, bowel sounds positive- normoactive  Extremities: No clubbing, cyanosis. RLE with 1+ edema -mildly improved from past exam. Pulses are 2+ Psych: Pt's affect is appropriate. Pt is cooperative.  Verbose. Skin: Clean and intact without signs of breakdown, bruising right thigh. Post-op dressing in place with spots of old blood present -Diffuse bruising on right inner thigh marked with pen 6/8-decreasing from margin 6-9  Neuro:  Alert and oriented x 3. Normal insight and awareness. Intact Memory. Normal language and speech. Cranial nerve exam unremarkable. MMT: UE 5/5. LLE 4/5. RLE 3 to 4/5 HF, 4+/5 KE . No focal sensory findings.    Musculoskeletal: less swollen than assessment prior, minimally tender to palpation   Assessment/Plan: 1. Functional deficits which require 3+ hours per day of interdisciplinary therapy in a comprehensive inpatient rehab setting. Physiatrist is providing close team supervision and 24 hour management of active medical problems listed below. Physiatrist and rehab team continue to assess barriers to discharge/monitor patient progress toward functional and medical goals  Care Tool:  Bathing  Bathing activity did not occur: Refused           Bathing assist       Upper Body Dressing/Undressing Upper body dressing   What is the patient wearing?: Dress    Upper body assist Assist Level: Supervision/Verbal cueing    Lower Body Dressing/Undressing Lower body dressing      What is the patient wearing?: Underwear/pull up     Lower body assist Assist for lower body dressing: Moderate Assistance - Patient 50 - 74%     Toileting Toileting    Toileting assist Assist for toileting: Moderate Assistance - Patient 50 - 74%     Transfers Chair/bed transfer  Transfers assist     Chair/bed transfer assist level: Minimal Assistance - Patient > 75%     Locomotion Ambulation  Ambulation assist      Assist level: Minimal Assistance - Patient > 75% Assistive device: Walker-rolling Max distance: 95'   Walk 10 feet activity   Assist     Assist level: Minimal Assistance - Patient > 75% Assistive device: Walker-rolling   Walk 50 feet activity   Assist Walk 50 feet with 2 turns activity did not occur: Safety/medical concerns  Assist level: Minimal Assistance - Patient > 75% Assistive device: Walker-rolling    Walk 150 feet activity   Assist Walk 150 feet activity did not occur: Safety/medical concerns         Walk 10 feet on uneven surface  activity   Assist Walk 10 feet on uneven surfaces activity did not occur: Safety/medical concerns          Wheelchair     Assist Is the patient using a wheelchair?: Yes Type of Wheelchair: Manual    Wheelchair assist level: Dependent - Patient 0% Max wheelchair distance: 10'    Wheelchair 50 feet with 2 turns activity    Assist        Assist Level: Dependent - Patient 0%   Wheelchair 150 feet activity     Assist      Assist Level: Dependent - Patient 0%   Blood pressure 121/69, pulse 80, temperature (!) 97.5 F (36.4 C), temperature source Oral, resp. rate 16, height 5\' 2"  (1.575 m), weight 54.4 kg, SpO2 98 %.  Medical Problem List and Plan: 1. Functional deficits secondary to right intertrochanteric femur fracture status post intramedullary fixation 01/01/2023.   -Weightbearing as tolerated             -patient may shower with surgical dressing covered             -ELOS/Goals: 18-21 days, SPV PT, Min A OT  -Patient is beginning CIR therapies today including PT and OT   -pt appears very motivated and wants to regain independence. Spoke with daughter today.   -Team conference today please see physician documentation under team conference tab, met with team  to discuss problems,progress, and goals. Formulized individual treatment plan based on medical history, underlying problem and comorbidities.   2.  Antithrombotics: -DVT/anticoagulation:  Pharmaceutical: Lovenox check vascular study.  Patient can transition to aspirin therapy on discharge for DVT prophylaxis             -antiplatelet therapy: N/A 3. Pain Management: Hydrocodone/Robaxin as needed  -pain is well controlled 4. Mood/Behavior/Sleep: Provide emotional support             -antipsychotic agents: Geodon as needed  -pt is positive and in good spirits presently  6/12 Hip pain continues to gradually improve 5. Neuropsych/cognition: This patient is capable of making decisions on her own behalf. 6. Skin/Wound Care: Routine skin checks. Continue post-op dressing 7. Fluids/Electrolytes/Nutrition: encourage  PO  -BUN sl increased. Discussed fluid intake with pt. Says she doesn't like water. Told her to drink whatever she liked  -6/10 BUN a little better at 24, CR stable 0.85, encourage fluids  8.  Acute blood loss anemia.  Follow-up hgb 11.8  -encourage appropriate dietary intake  -HGB 10.7- stable overall, continue to monitor 9.  Postoperative delirium.  Improved.  Consider outpatient dementia screening.  -pt reports that period of confusion was very scary for her 10.  Hypertension.  Norvasc 5 mg daily, Toprol-XL 12.5 mg daily.  Monitor with increased mobility  -6/7 bp well controlled  - 6/8: Diastolic hypotension. DC norvasc, encourage fluids.   -  6/10 BP a little soft at times but stable overall, norvasc was stopped  6/12 BP stable, continue to monitor     01/13/2023    3:41 AM 01/12/2023    7:28 PM 01/12/2023    2:09 PM  Vitals with BMI  Systolic 121 114 161  Diastolic 69 56 74  Pulse 80 77 85     11.  History of left breast cancer.  Continue from area.  Follow-up outpatient oncology 12.  Prediabetes.  SSI.  Latest hemoglobin A1c 5.6-6.3.  -sugars all nearly normal--dc cbg checks and ssi  -6/11 Glu was 108 on labs yesterday, stable   13.  History of interstitial cystitis with concurrent incontinence.  Admission urinalysis negative.  Pure wick discontinued  -pt is voiding continently 14.  Constipation.  MiraLAX daily, Senokot/Colace twice daily.   -LBM 6/5 by chart documentation.  - 6/10 LBM today, continue to monitor -6/12 LBM 6/10, continue to monitor 15.  Urge incontinence.  Not documented, patient endorsing at nighttime.  Check PVRs.  -6/9: PVRs remain low.  Patient endorsing longstanding history of urethral strictures requiring intermittent dilation, has not seen the urologist for workup in quite some time.  Was never on prescribed medication, took an over-the-counter supplement for this.  Denies dysuria.  Encouraged patient to get outpatient follow-up with urology if symptoms  are worsening, did briefly discuss possible medications but patient did not indicate interest in starting.  -6/12 start low dose oxybut  2.5mg  BID 16. Chronic insomnia  -She declines medication change, encourage sleep hygiene   LOS: 6 days A FACE TO FACE EVALUATION WAS PERFORMED  Fanny Dance 01/13/2023, 8:39 AM

## 2023-01-14 MED ORDER — OXYBUTYNIN CHLORIDE 5 MG PO TABS
5.0000 mg | ORAL_TABLET | Freq: Two times a day (BID) | ORAL | Status: DC
  Administered 2023-01-14 – 2023-01-16 (×4): 5 mg via ORAL
  Filled 2023-01-14 (×6): qty 1

## 2023-01-14 NOTE — Progress Notes (Signed)
Physical Therapy Session Note  Patient Details  Name: [name] [name] MRN: [id number] Date of Birth: May 14, 1932  Today's Date: 01/14/2023 PT Individual Time: 7829-5621 PT Individual Time Calculation (min): 43 min   Short Term Goals: Week 1:  PT Short Term Goal 1 (Week 1): STG = LTG secondary to ELOS  Skilled Therapeutic Interventions/Progress Updates:      Pt seated in WC upon arrival. Pt agreeable to therapy. Pt reports 5/10 pain today, therapist provided seated rest breaks and repositioning.   Pt ascended/descended 1x8, 1x6 3 inch steps with B UE support on R ascending handrail and min A, verbal cues provided to leave enough space for following limb and to slow down, verbal and tactile cues provided for weight shift; therapist guarding for R LE buckle but pt did not demo any buckling.   Pt performed 2x10  sit<>stand with no AD, and CGA, verbal cues provided for UE/LE positioning to facilitate equal weight distribution between R and L.   Pt performed 1x10 step ups,bilaterally onto 2 inch block with L UE support on chair positioned to L of pt and R HHA, and CGA/min A, progressed to 1x10 step ups bilaterally onto 4 inch block with same set up, verbal cues provided for upright posture.   Pt handed off to PT in main gym for back to back session.    Therapy Documentation Precautions:  Precautions Precautions: Fall Precaution Comments: HOH Restrictions Weight Bearing Restrictions: Yes RLE Weight Bearing: Weight bearing as tolerated Other Position/Activity Restrictions: hearing impaired Therapy/Group: Individual Therapy  [facility] [place], [place]ch[place], DPT  01/14/2023, 7:50 AM

## 2023-01-14 NOTE — Discharge Summary (Signed)
Physician Discharge Summary  Patient ID: KAIRIE LUKER MRN: [id number] DOB/AGE: 87/29/1933 87 y.o.  Admit date: 01/07/2023 Discharge date: 01/20/2023  Discharge Diagnoses:  Principal Problem:   Intertrochanteric fracture of right hip (HCC) DVT prophylaxis Acute blood loss anemia Postoperative delirium Hypertension History of breast cancer Prediabetes Interstitial cystitis Constipation Hyperlipidemia History of tobacco use Indigestion/abdominal gas/right upper quadrant pain  Discharged Condition: Stable  Significant Diagnostic Studies: DG Chest 2 View  Result Date: 01/17/2023 CLINICAL DATA:  Nausea EXAM: CHEST - 2 VIEW COMPARISON:  Chest x-ray 532 one thousand twenty-fourth FINDINGS: Heart is mildly enlarged. The lungs are clear. There is a small left pleural effusion. No pneumothorax. Surgical clips are seen in the left axilla. No acute fractures are identified. IMPRESSION: 1. Small left pleural effusion. 2. Mild cardiomegaly. Electronically Signed   By: Darliss Cheney M.D.   On: 01/17/2023 19:36   DG Abd 1 View  Result Date: 01/16/2023 CLINICAL DATA:  Abdominal pain EXAM: ABDOMEN - 1 VIEW COMPARISON:  Pelvis x-ray 01/01/2023 FINDINGS: The bowel gas pattern is normal. Multiple vascular calcifications are seen in the pelvis. There is a cluster of calcifications overlying the right sacral ala measuring up to 10 mm, unchanged. Lung bases are clear. Degenerative changes affect the spine. Right hip screw is present. Lesser trochanter fragment on the right is displaced medially, unchanged. IMPRESSION: 1. Nonobstructive bowel gas pattern. 2. Cluster of calcifications overlying the right sacral ala measuring up to 10 mm, unchanged. Findings may be vascular in origin. If there is high clinical concern for urinary tract calculus recommend further evaluation with CT. Electronically Signed   By: Darliss Cheney M.D.   On: 01/16/2023 19:32   VAS [place] LOWER EXTREMITY VENOUS (DVT)  Result Date:  01/09/2023  Lower Venous DVT Study Patient Name:  JDA HICKENBOTTOM  Date of Exam:   01/08/2023 Medical Rec #: [id number]         Accession #:    [id number] Date of Birth: 12-27-1931         Patient Gender: F Patient Age:   86 years Exam Location:  [facility] [place] Procedure:      VAS [place] LOWER EXTREMITY VENOUS (DVT) Referring Phys: Mariam Dollar --------------------------------------------------------------------------------  Indications: Pain and swelling s/p right hip IM nail 01-01-2023.  Comparison Study: No prior studies. Performing Technologist: Jean Rosenthal RDMS, RVT  Examination Guidelines: A complete evaluation includes B-mode imaging, spectral Doppler, color Doppler, and power Doppler as needed of all accessible portions of each vessel. Bilateral testing is considered an integral part of a complete examination. Limited examinations for reoccurring indications may be performed as noted. The reflux portion of the exam is performed with the patient in reverse Trendelenburg.  +---------+---------------+---------+-----------+----------+--------------+ RIGHT    CompressibilityPhasicitySpontaneityPropertiesThrombus Aging +---------+---------------+---------+-----------+----------+--------------+ CFV      Full           Yes      Yes                                 +---------+---------------+---------+-----------+----------+--------------+ SFJ      Full                                                        +---------+---------------+---------+-----------+----------+--------------+ FV Prox  Full                                                        +---------+---------------+---------+-----------+----------+--------------+  FV Mid   Full                                                        +---------+---------------+---------+-----------+----------+--------------+ FV DistalFull                                                         +---------+---------------+---------+-----------+----------+--------------+ PFV      Full                                                        +---------+---------------+---------+-----------+----------+--------------+ POP      Full           Yes      Yes                                 +---------+---------------+---------+-----------+----------+--------------+ PTV      Full                                                        +---------+---------------+---------+-----------+----------+--------------+ PERO     Full                                                        +---------+---------------+---------+-----------+----------+--------------+ Thrombosed perforating vein noted with communication with the PTV.  +---------+---------------+---------+-----------+----------+--------------+ LEFT     CompressibilityPhasicitySpontaneityPropertiesThrombus Aging +---------+---------------+---------+-----------+----------+--------------+ CFV      Full           Yes      Yes                                 +---------+---------------+---------+-----------+----------+--------------+ SFJ      Full                                                        +---------+---------------+---------+-----------+----------+--------------+ FV Prox  Full                                                        +---------+---------------+---------+-----------+----------+--------------+ FV Mid   Full                                                        +---------+---------------+---------+-----------+----------+--------------+  FV DistalFull                                                        +---------+---------------+---------+-----------+----------+--------------+ PFV      Full                                                        +---------+---------------+---------+-----------+----------+--------------+ POP      Full           Yes      Yes                                  +---------+---------------+---------+-----------+----------+--------------+ PTV      Full                                                        +---------+---------------+---------+-----------+----------+--------------+ PERO     Full                                                        +---------+---------------+---------+-----------+----------+--------------+     Summary: RIGHT: - 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 - No cystic structure found in the popliteal fossa.  LEFT: - There is no evidence of deep vein thrombosis in the lower extremity.  - A cystic structure is found in the popliteal fossa.  *See table(s) above for measurements and observations. Electronically signed by Gerarda Fraction on 01/09/2023 at 9:38:34 AM.    Final    ECHOCARDIOGRAM COMPLETE  Result Date: 01/02/2023    ECHOCARDIOGRAM REPORT   Patient Name:   DESHAWN MABRAY Date of Exam: 01/02/2023 Medical Rec #:  [id number]        Height:       62.0 in Accession #:    [id number]       Weight:       119.9 lb Date of Birth:  [birth year]        BSA:          1.538 m Patient Age:    89 years         BP:           141/77 mmHg Patient Gender: F                HR:           50 bpm. Exam Location:  Inpatient Procedure: 2D Echo, Cardiac Doppler and Color Doppler Indications:    Abnormal ECG R94.31                 Cardiomegaly I51.7  History:        Patient has no prior history of Echocardiogram examinations.                 Risk Factors:Hypertension  and Dyslipidemia.  Sonographer:    Dondra Prader RVT RCS Referring Phys: [id number] DAVID MANUEL ORTIZ  Sonographer Comments: Image acquisition challenging due to respiratory motion. IMPRESSIONS  1. Left ventricular ejection fraction, by estimation, is 60 to 65%. The left ventricle has normal function. The left ventricle has no regional wall motion abnormalities. Left ventricular diastolic parameters are consistent with Grade I diastolic dysfunction (impaired  relaxation).  2. Right ventricular systolic function is normal. The right ventricular size is normal. There is normal pulmonary artery systolic pressure. The estimated right ventricular systolic pressure is 11.9 mmHg.  3. Left atrial size was severely dilated.  4. Right atrial size was severely dilated.  5. The mitral valve is normal in structure. Mild mitral valve regurgitation. No evidence of mitral stenosis.  6. The aortic valve is tricuspid. Aortic valve regurgitation is not visualized. No aortic stenosis is present.  7. The inferior vena cava is normal in size with greater than 50% respiratory variability, suggesting right atrial pressure of 3 mmHg. FINDINGS  Left Ventricle: Left ventricular ejection fraction, by estimation, is 60 to 65%. The left ventricle has normal function. The left ventricle has no regional wall motion abnormalities. The left ventricular internal cavity size was normal in size. There is  no left ventricular hypertrophy. Left ventricular diastolic parameters are consistent with Grade I diastolic dysfunction (impaired relaxation). Right Ventricle: The right ventricular size is normal. No increase in right ventricular wall thickness. Right ventricular systolic function is normal. There is normal pulmonary artery systolic pressure. The tricuspid regurgitant velocity is 1.49 m/s, and  with an assumed right atrial pressure of 3 mmHg, the estimated right ventricular systolic pressure is 11.9 mmHg. Left Atrium: Left atrial size was severely dilated. Right Atrium: Right atrial size was severely dilated. Pericardium: There is no evidence of pericardial effusion. Mitral Valve: The mitral valve is normal in structure. Mild mitral valve regurgitation. No evidence of mitral valve stenosis. Tricuspid Valve: The tricuspid valve is normal in structure. Tricuspid valve regurgitation is mild . No evidence of tricuspid stenosis. Aortic Valve: The aortic valve is tricuspid. Aortic valve regurgitation is not  visualized. No aortic stenosis is present. Aortic valve mean gradient measures 4.5 mmHg. Aortic valve peak gradient measures 8.7 mmHg. Aortic valve area, by VTI measures 1.86 cm. Pulmonic Valve: The pulmonic valve was normal in structure. Pulmonic valve regurgitation is not visualized. No evidence of pulmonic stenosis. Aorta: The aortic root is normal in size and structure. Venous: The inferior vena cava is normal in size with greater than 50% respiratory variability, suggesting right atrial pressure of 3 mmHg. IAS/Shunts: No atrial level shunt detected by color flow Doppler.  LEFT VENTRICLE PLAX 2D LVIDd:         4.40 cm   Diastology LVIDs:         3.10 cm   LV e' medial:  10.30 cm/s LV PW:         0.80 cm   LV e' lateral: 11.40 cm/s LV IVS:        1.00 cm LVOT diam:     1.70 cm LV SV:         58 LV SV Index:   38 LVOT Area:     2.27 cm  RIGHT VENTRICLE          IVC RV Basal diam:  3.90 cm  IVC diam: 1.90 cm LEFT ATRIUM              Index  RIGHT ATRIUM           Index LA diam:        2.60 cm  1.69 cm/m   RA Area:     21.50 cm LA Vol (A2C):   107.0 ml 69.56 ml/m  RA Volume:   68.20 ml  44.34 ml/m LA Vol (A4C):   108.0 ml 70.21 ml/m LA Biplane Vol: 110.0 ml 71.51 ml/m  AORTIC VALVE                     PULMONIC VALVE AV Area (Vmax):    1.95 cm      PV Vmax:       0.94 m/s AV Area (Vmean):   1.92 cm      PV Peak grad:  3.5 mmHg AV Area (VTI):     1.86 cm AV Vmax:           147.50 cm/s AV Vmean:          101.150 cm/s AV VTI:            0.311 m AV Peak Grad:      8.7 mmHg AV Mean Grad:      4.5 mmHg LVOT Vmax:         127.00 cm/s LVOT Vmean:        85.750 cm/s LVOT VTI:          0.255 m LVOT/AV VTI ratio: 0.82  AORTA Ao Root diam: 2.70 cm Ao Asc diam:  3.50 cm TRICUSPID VALVE TR Peak grad:   8.9 mmHg TR Vmax:        149.00 cm/s  SHUNTS Systemic VTI:  0.26 m Systemic Diam: 1.70 cm Donato Schultz MD Electronically signed by Donato Schultz MD Signature Date/Time: 01/02/2023/12:53:58 PM    Final    Pelvis  Portable  Result Date: 01/01/2023 CLINICAL DATA:  Right hip ORIF EXAM: PORTABLE PELVIS 1-2 VIEWS COMPARISON:  12/31/2022 FINDINGS: Interval ORIF of a intratrochanteric right hip fracture utilizing a gamma nail device with fracture fragments in near anatomic alignment. Avulsion of the lesser trochanter again identified with progressive distraction of the avulsed fracture fragmentl now approximately 2.2 cm. No other fracture identified. Hip joint spaces are preserved. IMPRESSION: 1. Interval ORIF of a intratrochanteric right hip fracture with fracture fragments in near anatomic alignment. Electronically Signed   By: Helyn Numbers M.D.   On: 01/01/2023 16:10   DG HIP UNILAT WITH PELVIS 2-3 VIEWS RIGHT  Result Date: 01/01/2023 CLINICAL DATA:  Elective surgery. EXAM: DG HIP (WITH OR WITHOUT PELVIS) 2-3V RIGHT COMPARISON:  Preoperative imaging. FINDINGS: Five fluoroscopic spot views of the right hip obtained in the operating room. Sequential images during femoral intramedullary nail with distal locking and trans trochanteric screw fixation of proximal femur fracture. Fluoroscopy time 54 seconds. Dose 8.92 mGy. IMPRESSION: Intraoperative fluoroscopy during proximal femur fracture fixation. Electronically Signed   By: Narda Rutherford M.D.   On: 01/01/2023 15:00   DG C-Arm 1-60 Min-No Report  Result Date: 01/01/2023 Fluoroscopy was utilized by the requesting physician.  No radiographic interpretation.   DG Pelvis 1-2 Views  Result Date: 12/31/2022 CLINICAL DATA:  Fall. EXAM: PELVIS - 1-2 VIEW COMPARISON:  None Available. FINDINGS: Severely displaced and comminuted fracture is seen involving intertrochanteric region of proximal right femur. No other fracture or dislocation is noted. IMPRESSION: Severely displaced and comminuted intertrochanteric fracture of proximal right femur. Electronically Signed   By: Lupita Raider M.D.   On: 12/31/2022 12:40  DG Femur Min 2 Views Right  Result Date:  12/31/2022 CLINICAL DATA:  Fall. EXAM: RIGHT FEMUR 2 VIEWS COMPARISON:  None Available. FINDINGS: Severely displaced and comminuted intertrochanteric fracture of proximal right femur is noted. Middle and distal portions of right humerus are unremarkable. IMPRESSION: Severely displaced and comminuted intertrochanteric fracture of proximal right femur. Electronically Signed   By: Lupita Raider M.D.   On: 12/31/2022 12:39   DG Chest Portable 1 View  Result Date: 12/31/2022 CLINICAL DATA:  fall EXAM: PORTABLE CHEST 1 VIEW COMPARISON:  CXR 02/16/11 FINDINGS: Left axillary surgical clips. Cardiomegaly. No pleural effusion. No pneumothorax. There are prominent bilateral opacities that could represent pulmonary venous congestion or atypical infection. No focal airspace opacity. No radiographically apparent displaced rib fractures degenerative changes of the bilateral glenohumeral joints. Degenerative changes in the visualized lumbar spine. IMPRESSION: Cardiomegaly and pulmonary venous congestion. Electronically Signed   By: Lorenza Cambridge M.D.   On: 12/31/2022 12:38    Labs:  Basic Metabolic Panel: Recent Labs  Lab 01/11/23 0617 01/17/23 1208  NA 140 138  K 4.2 4.1  CL 106 99  CO2 23 23  GLUCOSE 108* 140*  BUN 24* 20  CREATININE 0.85 1.01*  CALCIUM 8.5* 9.2    CBC: Recent Labs  Lab 01/11/23 0617 01/17/23 1208  WBC 7.3 9.5  NEUTROABS  --  7.9*  HGB 10.7* 13.0  HCT 33.9* 41.4  MCV 99.7 100.5*  PLT 237 278    CBG: No results for input(s): "GLUCAP" in the last 168 hours.  Family history.  Father with myocardial infarction daughter with throat cancer.  Denies any colon cancer or rectal cancer or diabetes mellitus  Brief HPI:   BRIELLAH GORLEY is a 89 y.o. right-handed female with history significant for prediabetes, colon polyps, left breast cancer status postmastectomy, interstitial cystitis, hearing loss hyperlipidemia hypertension osteoarthritis with rotator cuff tear and history of  tobacco use.  Per chart review lives with spouse.  Independent driving prior to admission.  Husband physically cannot provide assistance.  She does have supportive family in the area.  Presented 12/31/2022 after mechanical fall landing on her right hip.  No loss of consciousness.  Admission chemistries unremarkable except BUN 26 urinalysis negative nitrite.  X-rays and imaging revealed right intertrochanteric femur fracture.  Underwent intramedullary fixation 01/01/2023 per Dr. Samson Frederic.  Weightbearing as tolerated right lower extremity.  Postoperative course with mild delirium with Geodon added recommendations of possible need for outpatient dementia screening and her confusion continued to improve.  Hospital course acute blood loss anemia from preoperative hemoglobin 14.8 postoperative 11.2.  Placed on Lovenox for DVT prophylaxis transition to aspirin at discharge.  Therapy evaluations completed due to patient decreased functional mobility was admitted for a comprehensive rehab program.   Hospital Course: SHALIN FRIDDLE was admitted to rehab 01/07/2023 for inpatient therapies to consist of PT, ST and OT at least three hours five days a week. Past admission physiatrist, therapy team and rehab RN have worked together to provide customized collaborative inpatient rehab.  Pertaining to patient's right intertrochanteric femur fracture status post intramedullary fixation 01/01/2023.  Patient would follow-up with orthopedic services.  Hip incision clean and dry staples have been removed.  Weightbearing as tolerated.  Vascular studies negative.  Transition from Lovenox to aspirin on discharge for DVT prophylaxis.  Acute blood loss anemia 10.7 no bleeding episodes.  Pain managed use of hydrocodone and Robaxin as needed.  Postoperative delirium much improved and she did remain on  Geodon as needed.  Blood pressure remains soft her Norvasc had been discontinued and monitored she did continue on low-dose Toprol.  She did  have a history of interstitial cystitis with stress incontinence maintain on Ditropan urinalysis negative no dysuria or hematuria.  History of breast cancer follow-up outpatient.  Prediabetes hemoglobin A1c 5.6-6.3 CBGs discontinued.  Bouts of constipation resolved with laxative assistance.  Patient did have bouts of indigestion abdominal gas right upper quadrant pain and CT of the abdomen unremarkable except for moderate size hiatal hernia.  Amylase and lipase within normal limits.  Hepatic panel was unremarkable.She remained on Protonix as well as reglan   Blood pressures were monitored on TID basis and soft and monitored     Rehab course: During patient's stay in rehab weekly team conferences were held to monitor patient's progress, set goals and discuss barriers to discharge. At admission, patient required minimal guard 50 feet rolling walker minimal guard sit to stand  Physical exam.  Blood pressure 120/55 pulse 55 temperature 98.7 respirations 18 oxygen saturation is 95% room air Constitutional.  No acute distress HEENT Head.  Normocephalic and atraumatic Eyes.  Pupils round and reactive to light no discharge without nystagmus Neck.  Supple nontender no JVD without thyromegaly Cardiac regular rate and rhythm without any extra sounds or murmur heard Abdomen.  Soft nontender positive bowel sounds without rebound Respiratory effort normal no respiratory distress without wheeze Neurologic.  Alert oriented x 4.  Reflexes 2+.  Motor strength 5/5 throughout except right lower extremity inhibited by pain  He/She  has had improvement in activity tolerance, balance, postural control as well as ability to compensate for deficits. He/She has had improvement in functional use RUE/LUE  and RLE/LLE as well as improvement in awareness.  Ambulates 20 feet rolling walker supervision to the bathroom.  Perform ambulatory transfer to toilet and donned mesh underwear with intermittent upper extremity support  with rolling walker contact-guard.  A send to descended 6 steps bilateral upper extremities minimal assist.  Perform stand pivot transfers to apartment bed with min assist for standing.  She can increase her ambulation up to 150 feet with rolling walker and supervision.  Full family teaching completed plan discharge to home       Disposition: Discharge to home    Diet: Carb modified  Special Instructions: No driving smoking or alcohol  Weightbearing as tolerated right lower extremity  Continue to hold Norvasc for now until follow-up with primary MD.  Medications at discharge 1.  Tylenol as needed 2.  Vitamin C 500 mg p.o. daily 3.  Aspirin 81 mg p.o. twice daily until 02/15/2023 and stop 4.  Os-Cal 2 tablets daily 5.  Hydrocodone 1 to 2 tablets every 4 hours as needed pain 6.  Femera 2.5 mg p.o. daily 7.  Robaxin 500 mg every 6 hours as needed muscle spasms 8.  Toprol-XL 12.5 mg p.o. daily 9.  Multivitamin daily 10.  Protonix 40 mg daily 11.  MiraLAX daily hold for loose stools 12.  Vitamin C 500 mg daily 13.  Claritin 10 mg daily as needed 14.  Geodon 20 mg twice daily between meals and bedtime as needed 15.Mylicon 80 ng four times dally as needed 16.Reglan 5mg  TID   Follow-up Information     Fanny Dance, MD Follow up.   Specialty: Physical Medicine and Rehabilitation Why: No formal follow-up needed Contact information: [address] [place] [address] [phone]         S[place]te[place],  Arlys John, MD Follow up.   Specialty: Orthopedic Surgery Why: Call for appointment Contact information: [address] [place] [address] [phone]                 Signed: Charlton Amor 01/18/2023, 5:11 AM

## 2023-01-14 NOTE — Progress Notes (Signed)
Occupational Therapy Session Note  Patient Details  Name: [name] [name] MRN: [id number] Date of Birth: Jan 01, 1932  Today's Date: 01/14/23 OT Individual Time 800-900 60 min       Short Term Goals: Week 1:  OT Short Term Goal 1 (Week 1): STG=LTG d/t ELOS  Skilled Therapeutic Interventions/Progress Updates:    Pt received sitting supine in bed.  Pt receptive to OT session and did state that she has been having issues with frequent urination but stated that this was baseline.  Pt had no c/o pain and was able to get EOB with (S).  Sit<>Stand completed with CGA for safety.  Functional mobility to sink  with RW for self-care with CGA and pt was able to perform dynamic standing balance within base of support to brush teeth, comb hair and wash face.  Functional mobility to toilet with RW and CGA for stand<>pivot transfer.  See flow sheet for output.  Pericare performed with CGA for safety while standing.  Education provided on using Hip kit (sock aid, and reacher) for donning/doffing socks.  RW bag placed on front of walker and education provided on how to use bag with retrieval and storage of items while in home.  Pt able to side step left and right using RW for maneuvering in bathroom at home.  Pt sitting in w/c at end of sesssion, chair alarm in place, all needs met and call light within reach.  Therapy Documentation Precautions:  Precautions Precautions: Fall Precaution Comments: HOH Restrictions Weight Bearing Restrictions: Yes RLE Weight Bearing: Weight bearing as tolerated Other Position/Activity Restrictions: hearing impaired    Therapy/Group: Individual Therapy  Liam Graham 01/14/2023, 8:49 AM

## 2023-01-14 NOTE — Progress Notes (Signed)
Physical Therapy Session Note  Patient Details  Name: [name] [name] MRN: [id number] Date of Birth: 11-11-1931  Today's Date: 01/14/2023 PT Individual Time: 1115-1155 + 1445 - 1525 PT Individual Time Calculation (min): 40 min + 40 min  Short Term Goals: Week 1:  PT Short Term Goal 1 (Week 1): STG = LTG secondary to ELOS  Skilled Therapeutic Interventions/Progress Updates:    Session 1: Chart reviewed and pt agreeable to therapy. Pt received seated on mat table in therapy gym with c/o pain in L hip that was not quantified. Session focused on amb quality and endurance to promote safe home access. Pt initiated session with sit to stand practice using S + RW. Pt then completed amb of 19ft + 188ft with 2 turns using S + RW. Pt also completed SST practice using S + RW. At end of session, pt was left seated in [facility] with alarm engaged, nurse call bell and all needs in reach.   Session 2: Chart reviewed and pt agreeable to therapy. Pt received seated in WC with no c/o pain but reports pain with amb in L hip. Session focused on amb quality and endurance, safety judgement, and preparation for home management. Pt initiated session with amb to toilet and later sink using S + RW for amb and dynamic balance. Pt then completed amb of 139ft +191ft +187ft using S + RW. Pt tasked with judging when to return to [facility] depending on endurance, and pt was able to safely walk to/from WC. PT and pt also discussed home set up and responsibilities including home maintenance and care giving for husband, with pt able to verbalize safe plans including alerts, recruiting assistance for highly physically active tasks, and using DME appropriately. At end of session, pt was left seated in [facility] with alarm engaged, nurse call bell and all needs in reach.  Therapy Documentation Precautions:  Precautions Precautions: Fall Precaution Comments: HOH Restrictions Weight Bearing Restrictions: Yes RLE Weight Bearing: Weight bearing as  tolerated Other Position/Activity Restrictions: hearing impaired General:     Therapy/Group: Individual Therapy  Dionne Milo, PT, DPT 01/14/2023, 1:18 PM

## 2023-01-14 NOTE — Progress Notes (Addendum)
PROGRESS NOTE   Subjective/Complaints:  She reports continued incontinence at night, makes it hard to sleep.  She feels it is better during the day since starting oxybutynin.   ROS:  + urge incontinence-a little improved + pain-improving gradually Patient denies fever, nausea, vomiting, diarrhea, constipation, cough, shortness of breath or chest pain, cough,  headache, or mood change.    Objective:   No results found. No results for input(s): "WBC", "HGB", "HCT", "PLT" in the last 72 hours.  No results for input(s): "NA", "K", "CL", "CO2", "GLUCOSE", "BUN", "CREATININE", "CALCIUM" in the last 72 hours.   Intake/Output Summary (Last 24 hours) at 01/14/2023 0851 Last data filed at 01/13/2023 1331 Gross per 24 hour  Intake 360 ml  Output --  Net 360 ml         Physical Exam: Vital Signs Blood pressure (!) 122/56, pulse 67, temperature 98.4 F (36.9 C), temperature source Oral, resp. rate 16, height 5\' 2"  (1.575 m), weight 54.4 kg, SpO2 94 %.  General: Alert and oriented x 3, No apparent distress.  Sitting upright in bed.  HEENT: [place], AT, MMM Neck: Supple without JVD or lymphadenopathy Heart: Reg rate and rhythm.  Chest: non-labored Abdomen: Soft, non-tender, non-distended, bowel sounds positive- normoactive  Extremities: No clubbing, cyanosis. RLE with 1+ edema -mildly improved from past exam. Pulses are 2+ Psych: Pt's affect is appropriate. Pt is cooperative. Pleasant Skin: Clean and intact without signs of breakdown, bruising right thigh. Staples have been removed. Incisions appear to be healing well  Neuro:  Alert and oriented x 3. Normal insight and awareness. Intact Memory. Normal language and speech. Cranial nerve exam unremarkable. MMT: UE 5/5. LLE 4/5. RLE 3 to 4/5 HF, 4+/5 KE . No focal sensory findings.   Musculoskeletal: less swollen than assessment prior, minimally tender to  palpation   Assessment/Plan: 1. Functional deficits which require 3+ hours per day of interdisciplinary therapy in a comprehensive inpatient rehab setting. Physiatrist is providing close team supervision and 24 hour management of active medical problems listed below. Physiatrist and rehab team continue to assess barriers to discharge/monitor patient progress toward functional and medical goals  Care Tool:  Bathing  Bathing activity did not occur: Refused           Bathing assist       Upper Body Dressing/Undressing Upper body dressing   What is the patient wearing?: Dress    Upper body assist Assist Level: Supervision/Verbal cueing    Lower Body Dressing/Undressing Lower body dressing      What is the patient wearing?: Underwear/pull up     Lower body assist Assist for lower body dressing: Moderate Assistance - Patient 50 - 74%     Toileting Toileting    Toileting assist Assist for toileting: Supervision/Verbal cueing     Transfers Chair/bed transfer  Transfers assist     Chair/bed transfer assist level: Minimal Assistance - Patient > 75%     Locomotion Ambulation   Ambulation assist      Assist level: Minimal Assistance - Patient > 75% Assistive device: Walker-rolling Max distance: 95'   Walk 10 feet activity   Assist     Assist level:  Minimal Assistance - Patient > 75% Assistive device: Walker-rolling   Walk 50 feet activity   Assist Walk 50 feet with 2 turns activity did not occur: Safety/medical concerns  Assist level: Minimal Assistance - Patient > 75% Assistive device: Walker-rolling    Walk 150 feet activity   Assist Walk 150 feet activity did not occur: Safety/medical concerns         Walk 10 feet on uneven surface  activity   Assist Walk 10 feet on uneven surfaces activity did not occur: Safety/medical concerns         Wheelchair     Assist Is the patient using a wheelchair?: Yes Type of Wheelchair:  Manual    Wheelchair assist level: Dependent - Patient 0% Max wheelchair distance: 10'    Wheelchair 50 feet with 2 turns activity    Assist        Assist Level: Dependent - Patient 0%   Wheelchair 150 feet activity     Assist      Assist Level: Dependent - Patient 0%   Blood pressure (!) 122/56, pulse 67, temperature 98.4 F (36.9 C), temperature source Oral, resp. rate 16, height 5\' 2"  (1.575 m), weight 54.4 kg, SpO2 94 %.  Medical Problem List and Plan: 1. Functional deficits secondary to right intertrochanteric femur fracture status post intramedullary fixation 01/01/2023.   -Weightbearing as tolerated             -patient may shower with surgical dressing covered             -ELOS/Goals: 18-21 days, SPV PT, Min A OT  -Patient is beginning CIR therapies today including PT and OT   -pt appears very motivated and wants to regain independence. Spoke with daughter today.   Cherlynn Polo have been removed  -Est Discharge 6/17  2.  Antithrombotics: -DVT/anticoagulation:  Pharmaceutical: Lovenox check vascular study.  Patient can transition to aspirin therapy on discharge for DVT prophylaxis             -antiplatelet therapy: N/A 3. Pain Management: Hydrocodone/Robaxin as needed  -pain is well controlled 4. Mood/Behavior/Sleep: Provide emotional support             -antipsychotic agents: Geodon as needed  -pt is positive and in good spirits presently  6/12 Hip pain continues to gradually improve 5. Neuropsych/cognition: This patient is capable of making decisions on her own behalf. 6. Skin/Wound Care: Routine skin checks. Continue post-op dressing 7. Fluids/Electrolytes/Nutrition: encourage PO  -BUN sl increased. Discussed fluid intake with pt. Says she doesn't like water. Told her to drink whatever she liked  -6/10 BUN a little better at 24, CR stable 0.85, encourage fluids  8.  Acute blood loss anemia.  Follow-up hgb 11.8  -encourage appropriate dietary  intake  -HGB 10.7- stable overall, continue to monitor 9.  Postoperative delirium.  Improved.  Consider outpatient dementia screening.  -pt reports that period of confusion was very scary for her 10.  Hypertension.  Norvasc 5 mg daily, Toprol-XL 12.5 mg daily.  Monitor with increased mobility  -6/7 bp well controlled  - 6/8: Diastolic hypotension. DC norvasc, encourage fluids.   -6/10 BP a little soft at times but stable overall, norvasc was stopped  6/13 BP controlled, continue to monitor     01/14/2023    3:24 AM 01/13/2023    7:20 PM 01/13/2023    3:41 AM  Vitals with BMI  Systolic 122 114 161  Diastolic 56 50 69  Pulse 67 73  80     11.  History of left breast cancer.  Continue from area.  Follow-up outpatient oncology 12.  Prediabetes.  SSI.  Latest hemoglobin A1c 5.6-6.3.  -sugars all nearly normal--dc cbg checks and ssi  -6/11 Glu was 108 on labs yesterday, stable   13.  History of interstitial cystitis with concurrent incontinence.  Admission urinalysis negative.  Pure wick discontinued  -pt is voiding continently 14.  Constipation.  MiraLAX daily, Senokot/Colace twice daily.   -LBM 6/5 by chart documentation.  - 6/10 LBM today, continue to monitor -6/13 LBM today, improved 15.  Urge incontinence.  Not documented, patient endorsing at nighttime.  Check PVRs.  -6/9: PVRs remain low.  Patient endorsing longstanding history of urethral strictures requiring intermittent dilation, has not seen the urologist for workup in quite some time.  Was never on prescribed medication, took an over-the-counter supplement for this.  Denies dysuria.  Encouraged patient to get outpatient follow-up with urology if symptoms are worsening, did briefly discuss possible medications but patient did not indicate interest in starting.  -6/12 start low dose oxybut  2.5mg  BID  6/13 increase oxybutynin to 5mg  BID, purwick at night if needed for 2 nights  16. Chronic insomnia  -She declines medication  change, encourage sleep hygiene   LOS: 7 days A FACE TO FACE EVALUATION WAS PERFORMED  [name] [name] 01/14/2023, 8:51 AM

## 2023-01-15 MED ORDER — ACETAMINOPHEN 325 MG PO TABS: 325.0000 mg | ORAL_TABLET | Freq: Four times a day (QID) | ORAL | Status: DC | PRN

## 2023-01-15 NOTE — Progress Notes (Signed)
Occupational Therapy Session Note  Patient Details  Name: [name] [name] MRN: [id number] Date of Birth: 1931/11/18  Today's Date: 01/15/2023 OT Individual Time: 1001-1115 OT Individual Time Calculation (min): 74 min    Short Term Goals: Week 1:  OT Short Term Goal 1 (Week 1): STG=LTG d/t ELOS  Skilled Therapeutic Interventions/Progress Updates:    Patient agreeable to participate in OT session. Reports 6-8/10 pain in right hip. Monitored during session and requested pain meds from nursing at end of session.   Patient participated in skilled OT session focusing on ADL re-training, functional mobility, endurance, activity tolerance, safety awareness, LE strength.  Therapist provided VC for proper hand placement.Therapist educated pt on proper technique for stepping up/down when pertaining to post op RLE, proper hand placement during sit<>stand transitions, and increasing coordination during functional mobility in order to improve safety awareness and balance while completing transfers and navigating within environment with RW.   - Functional mobility completed utilizing RW to navigate to/from pt's room to main gym with CGA. No rest breaks while navigating to main gym. When returning to room, pt walked from gym to nurse's station before requiring to sit and receive total assist in [facility] to return to room. - Pt reports difficulty with managing stairs and would like to focus session on increasing her ability to get in/out of her house with increased independence and decreased difficulty. Utilizing 6" hurdle, pt completed step overs forward/back 5X with RW and CGA. Able to step forward using either rt or lt foot. VC provided for safety with RW management and placing weight into BUE when off loading weight onto RLE.  - Utilizing 4 inch step, pt stepped up/down utilizing RW for balance and CGA. When up on step, pt placed/removed clothespins overhead with either UE (Right used more due to CTS in left).  Placed 10 clothespins before requiring a seated rest break prior to removing clothespins.      Therapy Documentation Precautions:  Precautions Precautions: Fall Precaution Comments: HOH Restrictions Weight Bearing Restrictions: Yes RLE Weight Bearing: Weight bearing as tolerated Other Position/Activity Restrictions: hearing impaired   Therapy/Group: Individual Therapy  Limmie Patricia, OTR/L,CBIS  Supplemental OT - [facility] and [facility] Secure Chat Preferred   01/15/2023, 7:58 AM

## 2023-01-15 NOTE — Progress Notes (Signed)
Patient's Diastolic 39 with tech. Nurse retook and it was raised to 52. Patient has an irregular apical pulse, but asymptomatic.

## 2023-01-15 NOTE — Progress Notes (Signed)
Physical Therapy Session Note  Patient Details  Name: [name] [name] MRN: [id number] Date of Birth: 08/12/1931  Today's Date: 01/15/2023 PT Individual Time: 0848-1000 and 1305-1350 PT Individual Time Calculation (min): 72 min and 45 min  Short Term Goals: Week 1:  PT Short Term Goal 1 (Week 1): STG = LTG secondary to ELOS  Skilled Therapeutic Interventions/Progress Updates: Pt presented in recliner agreeable to therapy. RN present in room providing am meds. Per pt continues to have unrated pain in hip but states was able to get out of bed and transfer to recliner with less pain this am. Pt agreeable to complete oral hygiene at sink and change gown prior to leaving room. Pt stood with CGA and RW and ambulated to sink with CGA. At sink pt completed oral hygiene in standing with close supervision as well as washed chest, arms, and stomach in standing with wet washcloth. Pt donned clean gown with set up seated in w/c. Pt then ambulated to ortho gym with RW and CGA 142ft. Pt ambulating with mildly antalgic gait, and shortened uneven step length. Pt c/o increased pain in LLE which after discussion contributed to weight bearing primarily through LLE when performing standing activities. After seated rest break in ortho gym, pt ambulated back to room in same manner as prior however required x 1 brief standing rest break. Pt returned to recliner at end of session and left with call bell within reach and needs met.   Tx2: Pt presented in recliner with dgt and son-in-law present agreeable to therapy. Pt with unrated pain in hip at start of session but increased during session with pain meds received at end of session. Pt stood with CGA and ambulated to bathroom with RW and CGA, pt was able to manage brief and demonstrate controlled descent to toilet for continent urinary void. Pt stood from toilet with CGA and ambulated to nsg station with RW for endurance. Pt transported remaining distance to day room. In day  room performed ambulatory transfer to [employer]. On Kinetron worked on [employer] to stand with Banker to work on increased [employer] bearing through RLE. Pt also worked on Animal nutritionist at [employer] 2 x 8 cycles. Once completed pt ambulated to wall rail and worked on side stepping 10ft  L/R with notably increased effort when stepping to L. Pt transported back to room once completed and performed ambulatory transfer to recliner with RW and CGA. Pt remained in recliner at end of session with belt alarm on, call bell within reach and needs met.      Therapy Documentation Precautions:  Precautions Precautions: Fall Precaution Comments: HOH Restrictions Weight Bearing Restrictions: Yes RLE Weight Bearing: Weight bearing as tolerated Other Position/Activity Restrictions: hearing impaired General:   Therapy/Group: Individual Therapy  Zahmir Lalla 01/15/2023, 11:20 AM

## 2023-01-15 NOTE — Progress Notes (Signed)
PROGRESS NOTE   Subjective/Complaints:  Reports she slept better using purewick. She feels like bladder control is improved during the day. Reports she had a BM today.  ROS:  + urge incontinence-improved + pain-improving gradually Patient denies fever, chills,  nausea, vomiting, diarrhea, constipation, cough, shortness of breath or chest pain, cough,  headache, or mood change.    Objective:   No results found. No results for input(s): "WBC", "HGB", "HCT", "PLT" in the last 72 hours.  No results for input(s): "NA", "K", "CL", "CO2", "GLUCOSE", "BUN", "CREATININE", "CALCIUM" in the last 72 hours.   Intake/Output Summary (Last 24 hours) at 01/15/2023 0812 Last data filed at 01/15/2023 0605 Gross per 24 hour  Intake 1420 ml  Output 600 ml  Net 820 ml         Physical Exam: Vital Signs Blood pressure 131/65, pulse 71, temperature 97.7 F (36.5 C), temperature source Oral, resp. rate 16, height 5\' 2"  (1.575 m), weight 52.9 kg, SpO2 100 %.  General: Alert and oriented x 3, No apparent distress.  Sitting upright in bed.  HEENT: [place], AT, MMM Neck: Supple without JVD or lymphadenopathy Heart: Reg rate and rhythm.  Chest: non-labored Abdomen: Soft, non-tender, non-distended, bowel sounds positive- normoactive  Extremities: No clubbing, cyanosis. RLE with 1+ edema -mildly improved from past exam. Pulses are 2+ Psych: Pt's affect is appropriate. Pt is cooperative. Pleasant Skin: Clean and intact without signs of breakdown, bruising right thigh. Staples have been removed. Incisions appear to be healing well- no surrounding redness or signs of infection  Neuro:  Alert and oriented x 3. Normal insight and awareness. Intact Memory. Normal language and speech. Cranial nerve exam unremarkable. MMT: UE 5/5. LLE 4/5. RLE 3 to 4/5 HF, 4+/5 KE . No focal sensory findings.   Musculoskeletal: less swollen than assessment prior, minimally  tender to palpation   Assessment/Plan: 1. Functional deficits which require 3+ hours per day of interdisciplinary therapy in a comprehensive inpatient rehab setting. Physiatrist is providing close team supervision and 24 hour management of active medical problems listed below. Physiatrist and rehab team continue to assess barriers to discharge/monitor patient progress toward functional and medical goals  Care Tool:  Bathing  Bathing activity did not occur: Refused           Bathing assist       Upper Body Dressing/Undressing Upper body dressing   What is the patient wearing?: Dress    Upper body assist Assist Level: Supervision/Verbal cueing    Lower Body Dressing/Undressing Lower body dressing      What is the patient wearing?: Underwear/pull up     Lower body assist Assist for lower body dressing: Moderate Assistance - Patient 50 - 74%     Toileting Toileting    Toileting assist Assist for toileting: Contact Guard/Touching assist     Transfers Chair/bed transfer  Transfers assist     Chair/bed transfer assist level: Minimal Assistance - Patient > 75%     Locomotion Ambulation   Ambulation assist      Assist level: Minimal Assistance - Patient > 75% Assistive device: Walker-rolling Max distance: 95'   Walk 10 feet activity   Assist  Assist level: Minimal Assistance - Patient > 75% Assistive device: Walker-rolling   Walk 50 feet activity   Assist Walk 50 feet with 2 turns activity did not occur: Safety/medical concerns  Assist level: Minimal Assistance - Patient > 75% Assistive device: Walker-rolling    Walk 150 feet activity   Assist Walk 150 feet activity did not occur: Safety/medical concerns         Walk 10 feet on uneven surface  activity   Assist Walk 10 feet on uneven surfaces activity did not occur: Safety/medical concerns         Wheelchair     Assist Is the patient using a wheelchair?: Yes Type of  Wheelchair: Manual    Wheelchair assist level: Dependent - Patient 0% Max wheelchair distance: 10'    Wheelchair 50 feet with 2 turns activity    Assist        Assist Level: Dependent - Patient 0%   Wheelchair 150 feet activity     Assist      Assist Level: Dependent - Patient 0%   Blood pressure 131/65, pulse 71, temperature 97.7 F (36.5 C), temperature source Oral, resp. rate 16, height 5\' 2"  (1.575 m), weight 52.9 kg, SpO2 100 %.  Medical Problem List and Plan: 1. Functional deficits secondary to right intertrochanteric femur fracture status post intramedullary fixation 01/01/2023.   -Weightbearing as tolerated             -patient may shower with surgical dressing covered             -ELOS/Goals: 18-21 days, SPV PT, Min A OT  -Patient is beginning CIR therapies today including PT and OT   -pt appears very motivated and wants to regain independence. Spoke with daughter today.   Cherlynn Polo have been removed  -Est Discharge 6/17  2.  Antithrombotics: -DVT/anticoagulation:  Pharmaceutical: Lovenox check vascular study.  Patient can transition to aspirin therapy on discharge for DVT prophylaxis             -antiplatelet therapy: N/A 3. Pain Management: Hydrocodone/Robaxin as needed  -pain is well controlled 4. Mood/Behavior/Sleep: Provide emotional support             -antipsychotic agents: Geodon as needed  -pt is positive and in good spirits presently  6/14 reports pain well controlled 5. Neuropsych/cognition: This patient is capable of making decisions on her own behalf. 6. Skin/Wound Care: Routine skin checks. Continue post-op dressing 7. Fluids/Electrolytes/Nutrition: encourage PO  -BUN sl increased. Discussed fluid intake with pt. Says she doesn't like water. Told her to drink whatever she liked  -6/10 BUN a little better at 24, CR stable 0.85, encourage fluids  -6/14 recheck BMP Monday, continue to encourage fluids for azotemia 8.  Acute blood loss anemia.   Follow-up hgb 11.8  -encourage appropriate dietary intake  -HGB 10.7- stable overall, continue to monitor 9.  Postoperative delirium.  Improved.  Consider outpatient dementia screening.  -pt reports that period of confusion was very scary for her 10.  Hypertension.  Norvasc 5 mg daily, Toprol-XL 12.5 mg daily.  Monitor with increased mobility  -6/7 bp well controlled  - 6/8: Diastolic hypotension. DC norvasc, encourage fluids.   -6/10 BP a little soft at times but stable overall, norvasc was stopped  6/14 well controlled     01/15/2023    4:04 AM 01/14/2023   10:52 PM 01/14/2023    8:00 PM  Vitals with BMI  Systolic 131 123 161  Diastolic 65 52  39  Pulse 71 66 57     11.  History of left breast cancer.  Continue from area.  Follow-up outpatient oncology 12.  Prediabetes.  SSI.  Latest hemoglobin A1c 5.6-6.3.  -sugars all nearly normal--dc cbg checks and ssi  -6/11 Glu was 108 on labs yesterday, stable   13.  History of interstitial cystitis with concurrent incontinence.  Admission urinalysis negative.  Pure wick discontinued  -pt is voiding continently 14.  Constipation.  MiraLAX daily, Senokot/Colace twice daily.   -LBM 6/5 by chart documentation.  - 6/10 LBM today, continue to monitor -6/13 LBM today, improved 6/14 LBM today, having regular BMs 15.  Urge incontinence.  Not documented, patient endorsing at nighttime.  Check PVRs.  -6/9: PVRs remain low.  Patient endorsing longstanding history of urethral strictures requiring intermittent dilation, has not seen the urologist for workup in quite some time.  Was never on prescribed medication, took an over-the-counter supplement for this.  Denies dysuria.  Encouraged patient to get outpatient follow-up with urology if symptoms are worsening, did briefly discuss possible medications but patient did not indicate interest in starting.  -6/12 start low dose oxybut  2.5mg  BID  6/13 increase oxybutynin to 5mg  BID, purwick at night if  needed for 2 nights  6/14 improved with oxybutynin BID, follow up urology outpatient   16. Chronic insomnia  -She declines medication change, encourage sleep hygiene   LOS: 8 days A FACE TO FACE EVALUATION WAS PERFORMED  Fanny Dance 01/15/2023, 8:12 AM

## 2023-01-16 ENCOUNTER — Inpatient Hospital Stay (HOSPITAL_COMMUNITY): Payer: Medicare Other

## 2023-01-16 DIAGNOSIS — K30 Functional dyspepsia: Secondary | ICD-10-CM

## 2023-01-16 MED ORDER — OXYBUTYNIN CHLORIDE 5 MG/5ML PO SOLN
5.0000 mg | Freq: Two times a day (BID) | ORAL | Status: DC
  Administered 2023-01-16 – 2023-01-17 (×2): 5 mg via ORAL
  Filled 2023-01-16 (×3): qty 5

## 2023-01-16 MED ORDER — BISMUTH SUBSALICYLATE 262 MG/15ML PO SUSP
30.0000 mL | ORAL | Status: DC | PRN
  Administered 2023-01-16: 30 mL via ORAL
  Filled 2023-01-16: qty 236

## 2023-01-16 MED ORDER — DOCUSATE SODIUM 50 MG/5ML PO LIQD
100.0000 mg | Freq: Two times a day (BID) | ORAL | Status: DC
  Administered 2023-01-17 – 2023-01-20 (×5): 100 mg via ORAL
  Filled 2023-01-16 (×6): qty 10

## 2023-01-16 MED ORDER — SIMETHICONE 80 MG PO CHEW
80.0000 mg | CHEWABLE_TABLET | Freq: Four times a day (QID) | ORAL | Status: DC
  Administered 2023-01-16 (×2): 80 mg via ORAL
  Filled 2023-01-16 (×2): qty 1

## 2023-01-16 MED ORDER — POLYETHYLENE GLYCOL 3350 17 G PO PACK
17.0000 g | PACK | Freq: Two times a day (BID) | ORAL | Status: DC
  Administered 2023-01-17 – 2023-01-20 (×5): 17 g via ORAL
  Filled 2023-01-16 (×6): qty 1

## 2023-01-16 MED ORDER — FAMOTIDINE 40 MG/5ML PO SUSR
40.0000 mg | Freq: Every day | ORAL | Status: DC
  Administered 2023-01-16: 40 mg via ORAL
  Filled 2023-01-16 (×2): qty 5

## 2023-01-16 MED ORDER — ONDANSETRON 4 MG PO TBDP: 4.0000 mg | ORAL_TABLET | Freq: Three times a day (TID) | ORAL | Status: DC | PRN

## 2023-01-16 MED ORDER — SENNOSIDES 8.8 MG/5ML PO SYRP
5.0000 mL | ORAL_SOLUTION | Freq: Two times a day (BID) | ORAL | Status: DC
  Administered 2023-01-17 – 2023-01-20 (×5): 5 mL via ORAL
  Filled 2023-01-16 (×8): qty 5

## 2023-01-16 MED ORDER — SIMETHICONE 40 MG/0.6ML PO SUSP
80.0000 mg | Freq: Four times a day (QID) | ORAL | Status: DC
  Administered 2023-01-16 – 2023-01-18 (×6): 80 mg via ORAL
  Administered 2023-01-18: 40 mg via ORAL
  Administered 2023-01-18 – 2023-01-19 (×5): 80 mg via ORAL
  Administered 2023-01-19: 40 mg via ORAL
  Administered 2023-01-20: 80 mg via ORAL
  Filled 2023-01-16 (×16): qty 1.2

## 2023-01-16 MED ORDER — PANTOPRAZOLE SODIUM 40 MG PO TBEC
40.0000 mg | DELAYED_RELEASE_TABLET | Freq: Every day | ORAL | Status: DC
  Administered 2023-01-17 – 2023-01-20 (×4): 40 mg via ORAL
  Filled 2023-01-16 (×5): qty 1

## 2023-01-16 NOTE — Progress Notes (Signed)
Patient continues to report pain "intense pressure under ribs" after eating/drinking. Per pt this started after dinner last night. Vitals done. Patient denies pain at rest. [address] PA notified of xray results at beginning of shift and patient's ongoing pain complaint. New orders received. Patient able to take pepcid, simethicone and ditropan liquid without issue although did report starting to feel some pressure at the end. No vomiting noted and patient was able to belch once.  2200- Patient asleep, no issues at this time.

## 2023-01-16 NOTE — Progress Notes (Signed)
Occupational Therapy Session Note  Patient Details  Name: [name] [name] MRN: [id number] Date of Birth: 10/17/1931  Today's Date: 01/16/2023 OT Individual Time: 1015-1100 OT Individual Time Calculation (min): 45 min    Short Term Goals: Week 1:  OT Short Term Goal 1 (Week 1): STG=LTG d/t ELOS  Skilled Therapeutic Interventions/Progress Updates:    Patient seen this day for skilled OT services.  The pt was in agreement with completing BADL  related task in bathing and dressing at sink LOF.  The pt was able to transfer from supine to EOB with CGA using the bed rail for additional balance.  The pt was able to transfer from EOB to w/c LOF using the RW at [facility] for coming from sit to stand and MinA for execution of for stand pivot transfer. The pt was transported to the sink and was able to doff her overhead gown with MinA for bilateral hand manipulation.  The pt was able to wash her face, chest, BUE , midriff, and upper portion of BLE with s/uA .  The pt was MaxA for BLE.  The pt was able to come from sit to stand using the sink to stabilize for washing her perineal and bottom with CGA.  The pt was able to donn her over head gown with s/uA, she was MaxA for her brief.  The pt was Max A for her non-skid socks and s/uA for brushing her teeth and combing her hair.   The pt was transferred to the recliner with MinA  for coming from sit to stand  at w/c LOF and was able to transfer to the recliner with  CGA.  At the end of treatment,  the call light and bedside table were both within reach and all additional needs were addressed.     Therapy Documentation Precautions:  Precautions Precautions: Fall Precaution Comments: HOH Restrictions Weight Bearing Restrictions: Yes RLE Weight Bearing: Weight bearing as tolerated Other Position/Activity Restrictions: hearing impaired  Therapy/Group: Individual Therapy  Lavona Mound 01/16/2023, 12:54 PM

## 2023-01-16 NOTE — Progress Notes (Signed)
PROGRESS NOTE   Subjective/Complaints:  Patient reports some gas bloating that started after dinner yesterday.  She reports bladder control has been better last few days.  ROS:  + urge incontinence-improved + pain-improving gradually + Increased GI gas Patient denies fever, chills,  nausea, vomiting, diarrhea, constipation, cough, shortness of breath or chest pain, cough,  headache, or mood change.    Objective:   No results found. No results for input(s): "WBC", "HGB", "HCT", "PLT" in the last 72 hours.  No results for input(s): "NA", "K", "CL", "CO2", "GLUCOSE", "BUN", "CREATININE", "CALCIUM" in the last 72 hours.   Intake/Output Summary (Last 24 hours) at 01/16/2023 0949 Last data filed at 01/16/2023 0631 Gross per 24 hour  Intake 830 ml  Output 1200 ml  Net -370 ml         Physical Exam: Vital Signs Blood pressure (!) 136/54, pulse 63, temperature (!) 97.5 F (36.4 C), temperature source Oral, resp. rate 18, height 5\' 2"  (1.575 m), weight 52.9 kg, SpO2 95 %.  General: Alert and oriented x 3, No apparent distress.  Sitting upright in bed.  Appears comfortable HEENT: [place], AT, MMM Neck: Supple without JVD or lymphadenopathy Heart: Reg rate and rhythm.  Chest: CTAB, non-labored Abdomen: Soft, non-tender, non-distended, bowel sounds positive- normoactive  Extremities: No clubbing, cyanosis.  exam. Pulses are 2+ Psych: Pt's affect is appropriate. Pt is cooperative. Pleasant.  Loquacious Skin: Clean and intact without signs of breakdown, bruising right thigh. Staples have been removed. Incisions appear to be healing well- no surrounding redness or signs of infection  Neuro:  Alert and oriented x 3. Normal insight and awareness. Intact Memory. Normal language and speech. Cranial nerve exam unremarkable. MMT: UE 5/5. LLE 4/5. RLE 3 to 4/5 HF, 4+/5 KE . No focal sensory findings.   Musculoskeletal: less swollen than  assessment prior, minimally tender to palpation   Assessment/Plan: 1. Functional deficits which require 3+ hours per day of interdisciplinary therapy in a comprehensive inpatient rehab setting. Physiatrist is providing close team supervision and 24 hour management of active medical problems listed below. Physiatrist and rehab team continue to assess barriers to discharge/monitor patient progress toward functional and medical goals  Care Tool:  Bathing  Bathing activity did not occur: Refused           Bathing assist       Upper Body Dressing/Undressing Upper body dressing   What is the patient wearing?: Dress    Upper body assist Assist Level: Supervision/Verbal cueing    Lower Body Dressing/Undressing Lower body dressing      What is the patient wearing?: Underwear/pull up     Lower body assist Assist for lower body dressing: Moderate Assistance - Patient 50 - 74%     Toileting Toileting    Toileting assist Assist for toileting: Contact Guard/Touching assist     Transfers Chair/bed transfer  Transfers assist     Chair/bed transfer assist level: Minimal Assistance - Patient > 75%     Locomotion Ambulation   Ambulation assist      Assist level: Minimal Assistance - Patient > 75% Assistive device: Walker-rolling Max distance: 95'   Walk 10 feet activity  Assist     Assist level: Minimal Assistance - Patient > 75% Assistive device: Walker-rolling   Walk 50 feet activity   Assist Walk 50 feet with 2 turns activity did not occur: Safety/medical concerns  Assist level: Minimal Assistance - Patient > 75% Assistive device: Walker-rolling    Walk 150 feet activity   Assist Walk 150 feet activity did not occur: Safety/medical concerns         Walk 10 feet on uneven surface  activity   Assist Walk 10 feet on uneven surfaces activity did not occur: Safety/medical concerns         Wheelchair     Assist Is the patient using a  wheelchair?: Yes Type of Wheelchair: Manual    Wheelchair assist level: Dependent - Patient 0% Max wheelchair distance: 10'    Wheelchair 50 feet with 2 turns activity    Assist        Assist Level: Dependent - Patient 0%   Wheelchair 150 feet activity     Assist      Assist Level: Dependent - Patient 0%   Blood pressure (!) 136/54, pulse 63, temperature (!) 97.5 F (36.4 C), temperature source Oral, resp. rate 18, height 5\' 2"  (1.575 m), weight 52.9 kg, SpO2 95 %.  Medical Problem List and Plan: 1. Functional deficits secondary to right intertrochanteric femur fracture status post intramedullary fixation 01/01/2023.   -Weightbearing as tolerated             -patient may shower with surgical dressing covered             -ELOS/Goals: 18-21 days, SPV PT, Min A OT  -Patient is beginning CIR therapies today including PT and OT   -pt appears very motivated and wants to regain independence. Spoke with daughter today.   Cherlynn Polo have been removed  -Est Discharge 6/17  2.  Antithrombotics: -DVT/anticoagulation:  Pharmaceutical: Lovenox check vascular study.  Patient can transition to aspirin therapy on discharge for DVT prophylaxis             -antiplatelet therapy: N/A 3. Pain Management: Hydrocodone/Robaxin as needed  -pain is well controlled 4. Mood/Behavior/Sleep: Provide emotional support             -antipsychotic agents: Geodon as needed  -pt is positive and in good spirits presently  6/15 pain is controlled, she uses occasional as needed Vicodin, continue for now 5. Neuropsych/cognition: This patient is capable of making decisions on her own behalf. 6. Skin/Wound Care: Routine skin checks. Continue post-op dressing 7. Fluids/Electrolytes/Nutrition: encourage PO  -BUN sl increased. Discussed fluid intake with pt. Says she doesn't like water. Told her to drink whatever she liked  -6/10 BUN a little better at 24, CR stable 0.85, encourage fluids  -6/14 recheck BMP  Monday, continue to encourage fluids for azotemia 8.  Acute blood loss anemia.  Follow-up hgb 11.8  -encourage appropriate dietary intake  -HGB 10.7- stable overall, continue to monitor 9.  Postoperative delirium.  Improved.  Consider outpatient dementia screening.  -pt reports that period of confusion was very scary for her 10.  Hypertension.  Norvasc 5 mg daily, Toprol-XL 12.5 mg daily.  Monitor with increased mobility  -6/7 bp well controlled  - 6/8: Diastolic hypotension. DC norvasc, encourage fluids.   -6/10 BP a little soft at times but stable overall, norvasc was stopped  6/15 controlled, continue to monitor     01/16/2023    4:36 AM 01/15/2023    8:33 PM 01/15/2023  1:04 PM  Vitals with BMI  Systolic 136 122 161  Diastolic 54 82 54  Pulse 63 77 67     11.  History of left breast cancer.  Continue from area.  Follow-up outpatient oncology 12.  Prediabetes.  SSI.  Latest hemoglobin A1c 5.6-6.3.  -sugars all nearly normal--dc cbg checks and ssi  -6/11 Glu was 108 on labs yesterday, stable   13.  History of interstitial cystitis with concurrent incontinence.  Admission urinalysis negative.  Pure wick discontinued  -pt is voiding continently 14.  Constipation.  MiraLAX daily, Senokot/Colace twice daily.   -LBM 6/5 by chart documentation.  - 6/10 LBM today, continue to monitor -6/13 LBM today, improved 6/15 BMs have been small and hard at times, increase MiraLAX to twice daily 15.  Urge incontinence.  Not documented, patient endorsing at nighttime.  Check PVRs.  -6/9: PVRs remain low.  Patient endorsing longstanding history of urethral strictures requiring intermittent dilation, has not seen the urologist for workup in quite some time.  Was never on prescribed medication, took an over-the-counter supplement for this.  Denies dysuria.  Encouraged patient to get outpatient follow-up with urology if symptoms are worsening, did briefly discuss possible medications but patient did  not indicate interest in starting.  -6/12 start low dose oxybut  2.5mg  BID  6/13 increase oxybutynin to 5mg  BID, purwick at night if needed for 2 nights  6/15 she has been primarily continent, reports incontinence most often happens if nursing can to bedside very quickly, continue oxybutynin  16. Chronic insomnia  -She declines medication change, encourage sleep hygiene   17.  Indigestion/abdominal gas  -Start Gas-X, Pepto-Bismol as needed  LOS: 9 days A FACE TO FACE EVALUATION WAS PERFORMED  Fanny Dance 01/16/2023, 9:49 AM

## 2023-01-17 ENCOUNTER — Inpatient Hospital Stay (HOSPITAL_COMMUNITY): Payer: Medicare Other

## 2023-01-17 DIAGNOSIS — R1084 Generalized abdominal pain: Secondary | ICD-10-CM

## 2023-01-17 LAB — CBC WITH DIFFERENTIAL/PLATELET
Abs Immature Granulocytes: 0.03 10*3/uL (ref 0.00–0.07)
Basophils Absolute: 0.1 10*3/uL (ref 0.0–0.1)
Basophils Relative: 1 %
Eosinophils Absolute: 0.2 10*3/uL (ref 0.0–0.5)
Eosinophils Relative: 2 %
HCT: 41.4 % (ref 36.0–46.0)
Hemoglobin: 13 g/dL (ref 12.0–15.0)
Immature Granulocytes: 0 %
Lymphocytes Relative: 8 %
Lymphs Abs: 0.7 10*3/uL (ref 0.7–4.0)
MCH: 31.6 pg (ref 26.0–34.0)
MCHC: 31.4 g/dL (ref 30.0–36.0)
MCV: 100.5 fL — ABNORMAL HIGH (ref 80.0–100.0)
Monocytes Absolute: 0.6 10*3/uL (ref 0.1–1.0)
Monocytes Relative: 7 %
Neutro Abs: 7.9 10*3/uL — ABNORMAL HIGH (ref 1.7–7.7)
Neutrophils Relative %: 82 %
Platelets: 278 10*3/uL (ref 150–400)
RBC: 4.12 MIL/uL (ref 3.87–5.11)
RDW: 14.4 % (ref 11.5–15.5)
WBC: 9.5 10*3/uL (ref 4.0–10.5)
nRBC: 0 % (ref 0.0–0.2)

## 2023-01-17 LAB — COMPREHENSIVE METABOLIC PANEL
ALT: 25 U/L (ref 0–44)
AST: 25 U/L (ref 15–41)
Albumin: 3.8 g/dL (ref 3.5–5.0)
Alkaline Phosphatase: 169 U/L — ABNORMAL HIGH (ref 38–126)
Anion gap: 16 — ABNORMAL HIGH (ref 5–15)
BUN: 20 mg/dL (ref 8–23)
CO2: 23 mmol/L (ref 22–32)
Calcium: 9.2 mg/dL (ref 8.9–10.3)
Chloride: 99 mmol/L (ref 98–111)
Creatinine, Ser: 1.01 mg/dL — ABNORMAL HIGH (ref 0.44–1.00)
GFR, Estimated: 53 mL/min — ABNORMAL LOW (ref >60)
Glucose, Bld: 140 mg/dL — ABNORMAL HIGH (ref 70–99)
Potassium: 4.1 mmol/L (ref 3.5–5.1)
Sodium: 138 mmol/L (ref 135–145)
Total Bilirubin: 1.3 mg/dL — ABNORMAL HIGH (ref 0.3–1.2)
Total Protein: 7.1 g/dL (ref 6.5–8.1)

## 2023-01-17 MED ORDER — SODIUM CHLORIDE 0.9 % IV SOLN: INTRAVENOUS | Status: DC

## 2023-01-17 NOTE — Progress Notes (Signed)
Pt with continued epigastric pain. Refused all meals. Took PO meds with increased time and rest breaks in between sips. Started on IV fluid due to poor PO intake. Son concerned about pt's discharge tomorrow. Dr. Benjie Karvonen aware.    Marylu Lund, RN

## 2023-01-17 NOTE — Progress Notes (Addendum)
Physical Therapy Discharge Summary  Patient Details  Name: [name] [name] MRN: [id number] Date of Birth: 1931-11-30  Date of Discharge from PT service:January 17, 2023  Today's Date: 01/17/2023 PT Individual Time: 0902-0947 PT Individual Time Calculation (min): 45 min    Patient has met 6 of 8 long term goals due to improved activity tolerance, improved balance, increased strength, increased range of motion, decreased pain, ability to compensate for deficits, improved awareness, and improved coordination.  Patient to discharge at an ambulatory level  with mod I for house hold ambulation, and supervision for car transfer, and min A for stair navigation .   Patient's care partner is independent to provide the necessary physical assistance at discharge.  Reasons goals not met: Pt requires min A for stair navigation with B UE support on R ascending HR. Pt requires supervision for car transfer, with verbal cues provided for technique and safety with UE placement.   Recommendation:  Patient will benefit from ongoing skilled PT services in home health setting to continue to advance safe functional mobility, address ongoing impairments in strength, balance, gait pattern, endurance, and minimize fall risk.  Equipment: RW   Reasons for discharge: treatment goals met and discharge from hospital  Patient/family agrees with progress made and goals achieved: Yes  PT Discharge Precautions/Restrictions Precautions Precautions: Fall Precaution Comments: HOH Restrictions Weight Bearing Restrictions: Yes RLE Weight Bearing: Weight bearing as tolerated Pain Interference Pain Interference Pain Effect on Sleep: 3. Frequently Pain Interference with Therapy Activities: 1. Rarely or not at all Pain Interference with Day-to-Day Activities: 2. Occasionally Vision/Perception  Vision - History Ability to See in Adequate Light: 0 Adequate Perception Perception: Within Functional Limits Praxis Praxis:  Intact  Cognition Overall Cognitive Status: Within Functional Limits for tasks assessed Arousal/Alertness: Awake/alert Orientation Level: Oriented X4 Year: 2024 Attention: Selective Sustained Attention: Appears intact Selective Attention: Impaired Memory: Appears intact Awareness: Appears intact Problem Solving: Appears intact Safety/Judgment: Appears intact Sensation Sensation Light Touch: Appears Intact Proprioception: Appears Intact Coordination Gross Motor Movements are Fluid and Coordinated: Yes Fine Motor Movements are Fluid and Coordinated: Yes Coordination and Movement Description: Pain guarding and generalized weakness Motor  Motor Motor: Within Functional Limits Motor - Skilled Clinical Observations: general muscle weakness due to hip fracture  Mobility Bed Mobility Bed Mobility: Rolling Right;Rolling Left;Supine to Sit Rolling Right: Independent Rolling Left: Independent Right Sidelying to Sit: Independent Supine to Sit: Independent Transfers Transfers: Sit to Stand;Stand to Sit;Stand Pivot Transfers Stand to Sit: Independent with assistive device Stand Pivot Transfers: Independent with assistive device Transfer (Assistive device): Rolling walker Locomotion  Gait Ambulation: Yes Gait Assistance: Independent with assistive device Gait Distance (Feet): 150 Feet Assistive device: Rolling walker Gait Gait: Yes Gait Pattern: Impaired Gait Pattern: Decreased stance time - right;Decreased step length - left;Decreased step length - right;Step-through pattern;Antalgic Gait velocity: decreased Stairs / Additional Locomotion Stairs: Yes Stairs Assistance: Minimal Assistance - Patient > 75% Stair Management Technique: One rail Right Number of Stairs: 8 Height of Stairs: 3 Ramp: Contact Guard/touching assist Wheelchair Mobility Wheelchair Mobility: No  Trunk/Postural Assessment  Cervical Assessment Cervical Assessment: Within Functional Limits Thoracic  Assessment Thoracic Assessment: Within Functional Limits Lumbar Assessment Lumbar Assessment: Within Functional Limits Postural Control Postural Control: Deficits on evaluation Righting Reactions: decreased Protective Responses: decreased  Balance Balance Balance Assessed: Yes Static Sitting Balance Static Sitting - Balance Support: Feet supported Static Sitting - Level of Assistance: 7: Independent Dynamic Sitting Balance Dynamic Sitting - Balance Support: Feet supported Dynamic Sitting - Level  of Assistance: 6: Modified independent (Device/Increase time) Static Standing Balance Static Standing - Balance Support: Bilateral upper extremity supported Static Standing - Level of Assistance: 6: Modified independent (Device/Increase time) Dynamic Standing Balance Dynamic Standing - Balance Support: Bilateral upper extremity supported Dynamic Standing - Level of Assistance: 6: Modified independent (Device/Increase time) Extremity Assessment      RLE Assessment RLE Assessment: Exceptions to [facility] General Strength Comments: grossly 3+/5 pt guarding 2/2 pain LLE Assessment LLE Assessment: Exceptions to [facility] General Strength Comments: grossly 4+/5, difficulty sequencing and understanding for formal assessment  Pt sitting EOB upon arrival. MD present for morning rounds and Pt reports R rib and back constant pain, not changing with position, worse when eating, pt reports she has not eaten today or yesterday 2/2 discomfort. MD present for pt reports, therapist also notified nurse.   Pt performed discharge components. Pt performed bed mobility on hospital bed with no bed rails and bed flat with supervision. Pt performed car transfer with supervision, pt demos poor recall of technique and safety with UE placement requiring cues. Pt picked up pen off of floor with CGA with RW, and with mod I with RW and reacher. Pt reports she has a Sports administrator at home. Pt ascended/descended stairs with R UE support on R  ascending HR and min A.   Pt attempted to call pt, as pt demos limited communication between her and pt son. Pt son unable to attend family training, however pt's daughters (who live out of town) have seen pt perform bed mobility, stair navigation and car transfer on 6/12 and they report they will emphasize with son. Education left on voicemail of pt utilzing B UE support on R ascending handrail and ascending stairs with lateral steps 2/2 pt unable to reach home rails simultaneously. Education provided on pt need for reminders for technique to get into car safely, emphasized sitting on seat then bringing legs in for safety with UE support on seat versus swinging door. Requested pt bring toyota camry versus son's SUV.   Pt seated in WC at end of session with all needs within reach and seatbelt alarm on.   [facility], [place], DPT  01/17/2023, 4:54 PM

## 2023-01-17 NOTE — Progress Notes (Addendum)
PROGRESS NOTE   Subjective/Complaints:  She has had some continued GI discomfort and fullness that occurs after she eats. Reports this sensation occurs in her upper abdomen bilaterally.  Reports episode of vomiting yesterday.  She feels better this morning however she has not tried to eat very much yet.  Denies nausea currently.  ROS:  + urge incontinence-improved + GI discomfort Patient denies fever, chills,   diarrhea, constipation, cough, shortness of breath or chest pain, cough,  headache, or mood change.    Objective:   DG Abd 1 View  Result Date: 01/16/2023 CLINICAL DATA:  Abdominal pain EXAM: ABDOMEN - 1 VIEW COMPARISON:  Pelvis x-ray 01/01/2023 FINDINGS: The bowel gas pattern is normal. Multiple vascular calcifications are seen in the pelvis. There is a cluster of calcifications overlying the right sacral ala measuring up to 10 mm, unchanged. Lung bases are clear. Degenerative changes affect the spine. Right hip screw is present. Lesser trochanter fragment on the right is displaced medially, unchanged. IMPRESSION: 1. Nonobstructive bowel gas pattern. 2. Cluster of calcifications overlying the right sacral ala measuring up to 10 mm, unchanged. Findings may be vascular in origin. If there is high clinical concern for urinary tract calculus recommend further evaluation with CT. Electronically Signed   By: Darliss Cheney M.D.   On: 01/16/2023 19:32   No results for input(s): "WBC", "HGB", "HCT", "PLT" in the last 72 hours.  No results for input(s): "NA", "K", "CL", "CO2", "GLUCOSE", "BUN", "CREATININE", "CALCIUM" in the last 72 hours.   Intake/Output Summary (Last 24 hours) at 01/17/2023 1120 Last data filed at 01/17/2023 0842 Gross per 24 hour  Intake 320 ml  Output 350 ml  Net -30 ml         Physical Exam: Vital Signs Blood pressure (!) 150/80, pulse 69, temperature 97.7 F (36.5 C), temperature source Oral, resp. rate  18, height 5\' 2"  (1.575 m), weight 52.9 kg, SpO2 99 %.  General: Alert and oriented x 3, No apparent distress.  Sitting upright in bed.  Appears comfortable HEENT: [place], AT, MMM Neck: Supple without JVD or lymphadenopathy Heart: Reg rate and rhythm.  Chest: CTAB, non-labored Abdomen: Soft, non-tender, non-distended, bowel sounds positive- normoactive , no guarding Extremities: No clubbing, cyanosis.  exam. Pulses are 2+ Psych: Pt's affect is appropriate. Pt is cooperative. Skin: Clean and intact without signs of breakdown, bruising right thigh. Staples have been removed. Incisions appear to be healing well- no surrounding redness or signs of infection  Neuro:  Alert and oriented x 3. Normal insight and awareness. Intact Memory. Normal language and speech. Cranial nerve exam unremarkable. MMT: UE 5/5. LLE 4/5. RLE 3 to 4/5 HF, 4+/5 KE . No focal sensory findings.   Musculoskeletal: less swollen than assessment prior, minimally tender to palpation   Assessment/Plan: 1. Functional deficits which require 3+ hours per day of interdisciplinary therapy in a comprehensive inpatient rehab setting. Physiatrist is providing close team supervision and 24 hour management of active medical problems listed below. Physiatrist and rehab team continue to assess barriers to discharge/monitor patient progress toward functional and medical goals  Care Tool:  Bathing  Bathing activity did not occur: Refused  Bathing assist       Upper Body Dressing/Undressing Upper body dressing   What is the patient wearing?: Dress    Upper body assist Assist Level: Supervision/Verbal cueing    Lower Body Dressing/Undressing Lower body dressing      What is the patient wearing?: Underwear/pull up     Lower body assist Assist for lower body dressing: Moderate Assistance - Patient 50 - 74%     Toileting Toileting    Toileting assist Assist for toileting: Contact Guard/Touching assist      Transfers Chair/bed transfer  Transfers assist     Chair/bed transfer assist level: Minimal Assistance - Patient > 75%     Locomotion Ambulation   Ambulation assist      Assist level: Minimal Assistance - Patient > 75% Assistive device: Walker-rolling Max distance: 95'   Walk 10 feet activity   Assist     Assist level: Minimal Assistance - Patient > 75% Assistive device: Walker-rolling   Walk 50 feet activity   Assist Walk 50 feet with 2 turns activity did not occur: Safety/medical concerns  Assist level: Minimal Assistance - Patient > 75% Assistive device: Walker-rolling    Walk 150 feet activity   Assist Walk 150 feet activity did not occur: Safety/medical concerns         Walk 10 feet on uneven surface  activity   Assist Walk 10 feet on uneven surfaces activity did not occur: Safety/medical concerns         Wheelchair     Assist Is the patient using a wheelchair?: Yes Type of Wheelchair: Manual    Wheelchair assist level: Dependent - Patient 0% Max wheelchair distance: 10'    Wheelchair 50 feet with 2 turns activity    Assist        Assist Level: Dependent - Patient 0%   Wheelchair 150 feet activity     Assist      Assist Level: Dependent - Patient 0%   Blood pressure (!) 150/80, pulse 69, temperature 97.7 F (36.5 C), temperature source Oral, resp. rate 18, height 5\' 2"  (1.575 m), weight 52.9 kg, SpO2 99 %.  Medical Problem List and Plan: 1. Functional deficits secondary to right intertrochanteric femur fracture status post intramedullary fixation 01/01/2023.   -Weightbearing as tolerated             -patient may shower with surgical dressing covered             -ELOS/Goals: 18-21 days, SPV PT, Min A OT  -Patient is beginning CIR therapies today including PT and OT   -pt appears very motivated and wants to regain independence. Spoke with daughter today.   Cherlynn Polo have been removed  -Est Discharge 6/17, may  need to delay if GI discomfort worsened/not improved  2.  Antithrombotics: -DVT/anticoagulation:  Pharmaceutical: Lovenox check vascular study.  Patient can transition to aspirin therapy on discharge for DVT prophylaxis             -antiplatelet therapy: N/A 3. Pain Management: Hydrocodone/Robaxin as needed  -pain is well controlled 4. Mood/Behavior/Sleep: Provide emotional support             -antipsychotic agents: Geodon as needed  -pt is positive and in good spirits presently  6/15 pain is controlled, she uses occasional as needed Vicodin, continue for now  6/16 she reports her hip pain is well-controlled 5. Neuropsych/cognition: This patient is capable of making decisions on her own behalf. 6. Skin/Wound Care: Routine skin checks.  Continue post-op dressing 7. Fluids/Electrolytes/Nutrition: encourage PO  -BUN sl increased. Discussed fluid intake with pt. Says she doesn't like water. Told her to drink whatever she liked  -6/10 BUN a little better at 24, CR stable 0.85, encourage fluids  -6/14 recheck BMP Monday, continue to encourage fluids for azotemia 8.  Acute blood loss anemia.  Follow-up hgb 11.8  -encourage appropriate dietary intake  -HGB 10.7- stable overall, continue to monitor 9.  Postoperative delirium.  Improved.  Consider outpatient dementia screening.  -pt reports that period of confusion was very scary for her 10.  Hypertension.  Norvasc 5 mg daily, Toprol-XL 12.5 mg daily.  Monitor with increased mobility  -6/7 bp well controlled  - 6/8: Diastolic hypotension. DC norvasc, encourage fluids.   -6/10 BP a little soft at times but stable overall, norvasc was stopped  6/16 intermittently a little elevated, continue to monitor for now     01/17/2023    3:41 AM 01/16/2023    7:58 PM 01/16/2023   12:56 PM  Vitals with BMI  Systolic 150 136 914  Diastolic 80 70 72  Pulse 69 60 59     11.  History of left breast cancer.  Continue from area.  Follow-up outpatient  oncology 12.  Prediabetes.  SSI.  Latest hemoglobin A1c 5.6-6.3.  -sugars all nearly normal--dc cbg checks and ssi  -6/11 Glu was 108 on labs yesterday, stable  -Will check glucose on CMP today   13.  History of interstitial cystitis with concurrent incontinence.  Admission urinalysis negative.  Pure wick discontinued  -pt is voiding continently 14.  Constipation.  MiraLAX daily, Senokot/Colace twice daily.   -LBM 6/5 by chart documentation.  - 6/10 LBM today, continue to monitor -6/13 LBM today, improved 6/15 BMs have been small and hard at times, increase MiraLAX to twice daily 6/16 has been refusing MiraLAX intermittently, did take today 15.  Urge incontinence.  Not documented, patient endorsing at nighttime.  Check PVRs.  -6/9: PVRs remain low.  Patient endorsing longstanding history of urethral strictures requiring intermittent dilation, has not seen the urologist for workup in quite some time.  Was never on prescribed medication, took an over-the-counter supplement for this.  Denies dysuria.  Encouraged patient to get outpatient follow-up with urology if symptoms are worsening, did briefly discuss possible medications but patient did not indicate interest in starting.  -6/12 start low dose oxybut  2.5mg  BID  6/13 increase oxybutynin to 5mg  BID, purwick at night if needed for 2 nights  6/16 remains continent, will hold oxybutynin as it can decrease GI motility  16. Chronic insomnia  -She declines medication change, encourage sleep hygiene   17.  Indigestion/abdominal gas  -Start Gas-X, Pepto-Bismol as needed  -Patient reports she is told she has a hernia, hiatal?.  Symptoms do sound consistent with hiatal hernia. Abdomen x-ray without acute changes. ,She was able take liquid Pepcid last night, declined protonix.  Reports she feels better today but has not had much p.o. intake this morning, will check labs CBC, CMP  -Addendum, continues to have poor tolerance of PO intake, drinking  small amount of ensure,  denies GI discomfort at this time but says "as soon as I eat I will get it", just pt took protonix, will check CXR, IVF NS 68ml/Hr started due to poor PO intake, Cr mildly up to 1.01, Total bili slightly up to 1.3, consider RUQ U/S  LOS: 10 days A FACE TO FACE EVALUATION WAS PERFORMED  Fanny Dance  01/17/2023, 11:20 AM

## 2023-01-17 NOTE — Plan of Care (Signed)
  Problem: RH Car Transfers Goal: LTG Patient will perform car transfers with assist (PT) Description: LTG: Patient will perform car transfers with assistance (PT). Outcome: Adequate for Discharge Flowsheets (Taken 01/17/2023 1635) LTG: Pt will perform car transfers with assist:: (pt requires supervision, verbal cues for technique and safety with UE placement) --   Problem: RH Stairs Goal: LTG Patient will ambulate up and down stairs w/assist (PT) Description: LTG: Patient will ambulate up and down # of stairs with assistance (PT) Outcome: Adequate for Discharge Flowsheets (Taken 01/17/2023 1635) LTG: Pt will ambulate up/down stairs assist needed:: (Pt requires min A for stair navigation) --   Problem: RH Balance Goal: LTG Patient will maintain dynamic standing balance (PT) Description: LTG:  Patient will maintain dynamic standing balance with assistance during mobility activities (PT) Outcome: Completed/Met   Problem: Sit to Stand Goal: LTG:  Patient will perform sit to stand with assistance level (PT) Description: LTG:  Patient will perform sit to stand with assistance level (PT) Outcome: Completed/Met   Problem: RH Bed Mobility Goal: LTG Patient will perform bed mobility with assist (PT) Description: LTG: Patient will perform bed mobility with assistance, with/without cues (PT). Outcome: Completed/Met   Problem: RH Bed to Chair Transfers Goal: LTG Patient will perform bed/chair transfers w/assist (PT) Description: LTG: Patient will perform bed to chair transfers with assistance (PT). Outcome: Completed/Met   Problem: RH Ambulation Goal: LTG Patient will ambulate in controlled environment (PT) Description: LTG: Patient will ambulate in a controlled environment, # of feet with assistance (PT). Outcome: Completed/Met Goal: LTG Patient will ambulate in home environment (PT) Description: LTG: Patient will ambulate in home environment, # of feet with assistance (PT). Outcome:  Completed/Met

## 2023-01-17 NOTE — Progress Notes (Signed)
Pt with epigastric pain 'tight and shooting' pain that started the evening of 6/14. Able to belch some stomach content and burp. Pain is worst with PO consumption. Pt reports no pain unless eating or drinking. Dr. Benjie Karvonen made aware. New ordered received and administered with no results.   Marylu Lund, RN

## 2023-01-17 NOTE — Progress Notes (Signed)
Occupational Therapy Session Note  Patient Details  Name: [name] [name] MRN: [id number] Date of Birth: 04/23/1932  Today's Date: 01/17/2023 OT Individual Time: 1100-1200 OT Individual Time Calculation (min): 60 min    Short Term Goals: Week 1:  OT Short Term Goal 1 (Week 1): STG=LTG d/t ELOS  Skilled Therapeutic Interventions/Progress Updates:   Patient seated in her room in good spirits  at the time of arrival.  Patient expressed a willing to participant in skilled OT and was transported to the gym to complete UB exercise using a 1lb dowel for  2 sets of 15 for  shld flexion, horizontal abduction, shld rotation, and lg circles with rest breaks as needed. The pt required 1 rest break with each exercise. The pt went on to remove suction pegs from the mirror, alternating hands for removal based on the designated color to improve bilateral hand strength for gains with bilateral hand manipulation during functional task performance   The pt was able to come from sit to stand at w/c LOF to attach clothes pin to a rod  incorporation her left hand as a prime mover and using the opposite  hand as an assist. The pt was able to remove the pegs with her non dominant hand to improve bilateral hand coordination during functional task performance.The pt went on to complete a simulated task in LB dressing 2x , using medium grade theraband  LB dressing and reduce the burden of care for others. The pt was able to complete the simulated task with MinA and following demonstration and vc's for using the opposite hand opposite leg to improve  AROM for LB task performance.  Following the exercise, the pt was transported back to her room from the gym and she remained at w/c LOF with her call light, chair alarm, which was activated, and  her bedside table within reach.  All additional needs of the pt were addressed prior to exiting the room.   Therapy Documentation Precautions:  Precautions Precautions: Fall Precaution  Comments: HOH Restrictions Weight Bearing Restrictions: Yes RLE Weight Bearing: Weight bearing as tolerated Other Position/Activity Restrictions: hearing impaired Therapy/Group: Individual Therapy  Lavona Mound 01/17/2023, 12:05 PM

## 2023-01-18 DIAGNOSIS — R1011 Right upper quadrant pain: Secondary | ICD-10-CM

## 2023-01-18 DIAGNOSIS — K5901 Slow transit constipation: Secondary | ICD-10-CM

## 2023-01-18 DIAGNOSIS — R638 Other symptoms and signs concerning food and fluid intake: Secondary | ICD-10-CM

## 2023-01-18 LAB — HEPATIC FUNCTION PANEL
ALT: 23 U/L (ref 0–44)
AST: 22 U/L (ref 15–41)
Albumin: 3.2 g/dL — ABNORMAL LOW (ref 3.5–5.0)
Alkaline Phosphatase: 134 U/L — ABNORMAL HIGH (ref 38–126)
Bilirubin, Direct: 0.2 mg/dL (ref 0.0–0.2)
Indirect Bilirubin: 0.9 mg/dL (ref 0.3–0.9)
Total Bilirubin: 1.1 mg/dL (ref 0.3–1.2)
Total Protein: 6 g/dL — ABNORMAL LOW (ref 6.5–8.1)

## 2023-01-18 LAB — CBC
HCT: 37.3 % (ref 36.0–46.0)
Hemoglobin: 11.9 g/dL — ABNORMAL LOW (ref 12.0–15.0)
MCH: 31.6 pg (ref 26.0–34.0)
MCHC: 31.9 g/dL (ref 30.0–36.0)
MCV: 99.2 fL (ref 80.0–100.0)
Platelets: 218 10*3/uL (ref 150–400)
RBC: 3.76 MIL/uL — ABNORMAL LOW (ref 3.87–5.11)
RDW: 14.4 % (ref 11.5–15.5)
WBC: 6.5 10*3/uL (ref 4.0–10.5)
nRBC: 0 % (ref 0.0–0.2)

## 2023-01-18 LAB — BASIC METABOLIC PANEL
Anion gap: 13 (ref 5–15)
BUN: 17 mg/dL (ref 8–23)
CO2: 24 mmol/L (ref 22–32)
Calcium: 8.5 mg/dL — ABNORMAL LOW (ref 8.9–10.3)
Chloride: 103 mmol/L (ref 98–111)
Creatinine, Ser: 0.7 mg/dL (ref 0.44–1.00)
GFR, Estimated: >60 mL/min (ref >60)
Glucose, Bld: 99 mg/dL (ref 70–99)
Potassium: 4 mmol/L (ref 3.5–5.1)
Sodium: 140 mmol/L (ref 135–145)

## 2023-01-18 LAB — LIPASE, BLOOD: Lipase: 30 U/L (ref 11–51)

## 2023-01-18 LAB — AMYLASE: Amylase: 35 U/L (ref 28–100)

## 2023-01-18 MED ORDER — METOCLOPRAMIDE HCL 5 MG/5ML PO SOLN
5.0000 mg | Freq: Three times a day (TID) | ORAL | Status: DC
  Administered 2023-01-18 – 2023-01-20 (×5): 5 mg via ORAL
  Filled 2023-01-18 (×6): qty 10

## 2023-01-18 MED ORDER — PANTOPRAZOLE SODIUM 40 MG PO TBEC: 40.0000 mg | DELAYED_RELEASE_TABLET | Freq: Every day | ORAL | 0 refills | Status: AC

## 2023-01-18 MED ORDER — ASPIRIN 81 MG PO TBEC: 81.0000 mg | DELAYED_RELEASE_TABLET | Freq: Two times a day (BID) | ORAL | 12 refills | Status: AC

## 2023-01-18 MED ORDER — METHOCARBAMOL 500 MG PO TABS: 500.0000 mg | ORAL_TABLET | Freq: Four times a day (QID) | ORAL | 0 refills | Status: AC | PRN

## 2023-01-18 MED ORDER — HYDROCODONE-ACETAMINOPHEN 5-325 MG PO TABS: 1.0000 | ORAL_TABLET | ORAL | 0 refills | Status: DC | PRN

## 2023-01-18 MED ORDER — METOPROLOL SUCCINATE ER 25 MG PO TB24: 12.5000 mg | ORAL_TABLET | Freq: Every day | ORAL | 2 refills | Status: DC

## 2023-01-18 MED ORDER — ZIPRASIDONE HCL 20 MG PO CAPS: 20.0000 mg | ORAL_CAPSULE | Freq: Two times a day (BID) | ORAL | 0 refills | Status: AC | PRN

## 2023-01-18 NOTE — Progress Notes (Signed)
Inpatient Rehabilitation Care Coordinator Discharge Note   Patient Details  Name: [name] [name] MRN: [id number] Date of Birth: 1931-11-12   Discharge location: HOME WITH HUSBAND WHO CAN BE THERE BUT NOT ASSIST-SON HAS HIRED ASSIST 12-6 PM DAILY  Length of Stay: 13 DAYS  Discharge activity level: MOD/I-SUPERVISION LEVEL  Home/community participation: ACTIVE  Patient response NF:AOZHYQ Literacy - How often do you need to have someone help you when you read instructions, pamphlets, or other written material from your doctor or pharmacy?: Never  Patient response MV:HQIONG Isolation - How often do you feel lonely or isolated from those around you?: Never  Services provided included: MD, RD, PT, OT, RN, CM, TR, Pharmacy, SW  Financial Services:  Financial Services Utilized: Medicare    Choices offered to/list presented to: PT AND SON  Follow-up services arranged:  Home Health, DME, Patient/Family request agency HH/DME Home Health Agency: [facility]  PT & OT    DME : [facility] AND 3 IN 1 HH/DME Requested Agency: [facility] [employer] [employer] [facility]  Patient response to transportation need: Is the patient able to respond to transportation needs?: Yes In the past 12 months, has lack of transportation kept you from medical appointments or from getting medications?: No In the past 12 months, has lack of transportation kept you from meetings, work, or from getting things needed for daily living?: No   Patient/Family verbalized understanding of follow-up arrangements:  Yes  Individual responsible for coordination of the follow-up plan: ANDY-SON [phone]  Confirmed correct DME delivered: Lucy Chris 01/18/2023    Comments (or additional information):PT DID WELL BUT STILL AT RISK TO FALL. HUSBAND IS THERE BUT USES A WALKER ON HIS OWN. HIRED ASSIST FOR 6 HOURS PER DAY.  Summary of Stay    Date/Time Discharge Planning CSW  01/13/23 1040 Home  with husband who can only provide supervision only. Daughter flying back to [place] and other daughter coming in. Son has hired some assist for Dad who is at home for home management for him. RGD       Lucy Chris

## 2023-01-18 NOTE — Progress Notes (Addendum)
Patient ID: [name] [name], female   DOB: [birth year], 89 y.o.   MRN: [id number]  MD reports pr having tests this am unsure of discharge home. Will await medical stability  11:00 AM MD to hold discharge today due to medical issues

## 2023-01-18 NOTE — Progress Notes (Signed)
Inpatient Rehabilitation Discharge Medication Review by a Pharmacist  A complete drug regimen review was completed for this patient to identify any potential clinically significant medication issues.  High Risk Drug Classes Is patient taking? Indication by Medication  Antipsychotic Yes Geodon- agitation/anxiety/combative  Anticoagulant No   Antibiotic No   Opioid Yes Norco- acute pain  Antiplatelet Yes Aspirin- ortho vte ppx  Hypoglycemics/insulin No   Vasoactive Medication Yes Toprol- HTN  Chemotherapy Yes, Oral Chemotherapy Femera- breast CA  Other Yes Claritin- allergies Robaxin- muscle relaxant Protonix- GERD     Type of Medication Issue Identified Description of Issue Recommendation(s)  Drug Interaction(s) (clinically significant)     Duplicate Therapy     Allergy     No Medication Administration End Date     Incorrect Dose     Additional Drug Therapy Needed     Significant med changes from prior encounter (inform family/care partners about these prior to discharge).    Other       Clinically significant medication issues were identified that warrant physician communication and completion of prescribed/recommended actions by midnight of the next day:  No   Time spent performing this drug regimen review (minutes):  30   Nikolaus Pienta BS, PharmD, BCPS Clinical Pharmacist 01/18/2023 7:12 AM  Contact: [phone] after 3 PM  "Be curious, not judgmental..." -Debbora Dus

## 2023-01-18 NOTE — Progress Notes (Signed)
Provider notified that pt refusing multiple meds due to difficulty swallowing and severe abdominal pain after swallowing liquids or pills.

## 2023-01-18 NOTE — Progress Notes (Signed)
PROGRESS NOTE   Subjective/Complaints:  Pt reports continued RUQ pain immediately after she eats. It's happening even with supplemental shakes, sometimes clear liquids. She's afraid to eat as a result. "I'm not a complainer either!" Sometimes there is pain when she moves, not always if she coughs or takes a deep breath.   ROS: Patient denies fever, rash, sore throat, blurred vision, dizziness,   diarrhea, cough, shortness of breath or chest pain, joint or back/neck pain, headache, or mood change.    Objective:   DG Chest 2 View  Result Date: 01/17/2023 CLINICAL DATA:  Nausea EXAM: CHEST - 2 VIEW COMPARISON:  Chest x-ray 532 one thousand twenty-fourth FINDINGS: Heart is mildly enlarged. The lungs are clear. There is a small left pleural effusion. No pneumothorax. Surgical clips are seen in the left axilla. No acute fractures are identified. IMPRESSION: 1. Small left pleural effusion. 2. Mild cardiomegaly. Electronically Signed   By: Darliss Cheney M.D.   On: 01/17/2023 19:36   DG Abd 1 View  Result Date: 01/16/2023 CLINICAL DATA:  Abdominal pain EXAM: ABDOMEN - 1 VIEW COMPARISON:  Pelvis x-ray 01/01/2023 FINDINGS: The bowel gas pattern is normal. Multiple vascular calcifications are seen in the pelvis. There is a cluster of calcifications overlying the right sacral ala measuring up to 10 mm, unchanged. Lung bases are clear. Degenerative changes affect the spine. Right hip screw is present. Lesser trochanter fragment on the right is displaced medially, unchanged. IMPRESSION: 1. Nonobstructive bowel gas pattern. 2. Cluster of calcifications overlying the right sacral ala measuring up to 10 mm, unchanged. Findings may be vascular in origin. If there is high clinical concern for urinary tract calculus recommend further evaluation with CT. Electronically Signed   By: Darliss Cheney M.D.   On: 01/16/2023 19:32   Recent Labs    01/17/23 1208  01/18/23 0613  WBC 9.5 6.5  HGB 13.0 11.9*  HCT 41.4 37.3  PLT 278 218   Recent Labs    01/17/23 1208 01/18/23 0613  NA 138 140  K 4.1 4.0  CL 99 103  CO2 23 24  GLUCOSE 140* 99  BUN 20 17  CREATININE 1.01* 0.70  CALCIUM 9.2 8.5*    Intake/Output Summary (Last 24 hours) at 01/18/2023 1036 Last data filed at 01/17/2023 1820 Gross per 24 hour  Intake 258 ml  Output --  Net 258 ml        Physical Exam: Vital Signs Blood pressure (!) 154/65, pulse 60, temperature (!) 97.4 F (36.3 C), temperature source Oral, resp. rate 18, height 5\' 2"  (1.575 m), weight 52.9 kg, SpO2 98 %.  Constitutional: No distress . Vital signs reviewed. HEENT: NCAT, EOMI, oral membranes moist Neck: supple Cardiovascular: RRR without murmur. No JVD    Respiratory/Chest: CTA Bilaterally without wheezes or rales. Normal effort    GI/Abdomen: BS +, RUQ pain with palpation. No distention.    Ext: no clubbing, cyanosis, or edema Psych: pleasant and cooperative  Skin: Clean and intact without signs of breakdown, bruising right thigh. Staples have been removed. Incisions appear to be healing well- no surrounding redness or signs of infection  Neuro:  Alert and oriented x 3. Normal  insight and awareness. Intact Memory. Normal language and speech. Cranial nerve exam unremarkable. MMT: UE 5/5. LLE 4/5. RLE 3 to 4/5 HF, 4+/5 KE . No focal sensory findings.   Musculoskeletal: right hip tenderness. Right chest wall pain with palpation, but inconsistent.   Assessment/Plan: 1. Functional deficits which require 3+ hours per day of interdisciplinary therapy in a comprehensive inpatient rehab setting. Physiatrist is providing close team supervision and 24 hour management of active medical problems listed below. Physiatrist and rehab team continue to assess barriers to discharge/monitor patient progress toward functional and medical goals  Care Tool:  Bathing  Bathing activity did not occur: Refused            Bathing assist       Upper Body Dressing/Undressing Upper body dressing   What is the patient wearing?: Dress    Upper body assist Assist Level: Supervision/Verbal cueing    Lower Body Dressing/Undressing Lower body dressing      What is the patient wearing?: Underwear/pull up     Lower body assist Assist for lower body dressing: Moderate Assistance - Patient 50 - 74%     Toileting Toileting    Toileting assist Assist for toileting: Contact Guard/Touching assist     Transfers Chair/bed transfer  Transfers assist     Chair/bed transfer assist level: Independent with assistive device     Locomotion Ambulation   Ambulation assist      Assist level: Independent with assistive device Assistive device: Walker-rolling Max distance: 150   Walk 10 feet activity   Assist     Assist level: Independent with assistive device Assistive device: Walker-rolling   Walk 50 feet activity   Assist Walk 50 feet with 2 turns activity did not occur: Safety/medical concerns  Assist level: Independent with assistive device Assistive device: Walker-rolling    Walk 150 feet activity   Assist Walk 150 feet activity did not occur: Safety/medical concerns  Assist level: Independent with assistive device Assistive device: Walker-rolling    Walk 10 feet on uneven surface  activity   Assist Walk 10 feet on uneven surfaces activity did not occur: Safety/medical concerns   Assist level: Contact Guard/Touching assist     Wheelchair     Assist Is the patient using a wheelchair?: Yes Type of Wheelchair: Manual    Wheelchair assist level: Dependent - Patient 0% Max wheelchair distance: 10'    Wheelchair 50 feet with 2 turns activity    Assist        Assist Level: Dependent - Patient 0%   Wheelchair 150 feet activity     Assist      Assist Level: Dependent - Patient 0%   Blood pressure (!) 154/65, pulse 60, temperature (!) 97.4 F (36.3  C), temperature source Oral, resp. rate 18, height 5\' 2"  (1.575 m), weight 52.9 kg, SpO2 98 %.  Medical Problem List and Plan: 1. Functional deficits secondary to right intertrochanteric femur fracture status post intramedullary fixation 01/01/2023.   -Weightbearing as tolerated             -holding discharge today due to RUQ pain.   -spoke to son. Will take it day by day based on her clinical status,  work up and findings.  2.  Antithrombotics: -DVT/anticoagulation:  Pharmaceutical: Lovenox check vascular study.  Patient can transition to aspirin therapy on discharge for DVT prophylaxis             -antiplatelet therapy: N/A 3. Pain Management: Hydrocodone/Robaxin as needed  -  pain is well controlled 4. Mood/Behavior/Sleep: Provide emotional support             -antipsychotic agents: Geodon as needed  -pt is positive and in good spirits presently  6/15 pain is controlled, she uses occasional as needed Vicodin, continue for now  6/16-17 she reports her hip pain is well-controlled 5. Neuropsych/cognition: This patient is capable of making decisions on her own behalf. 6. Skin/Wound Care: Routine skin checks. Continue post-op dressing 7. Fluids/Electrolytes/Nutrition: encourage PO  -BUN sl increased. Discussed fluid intake with pt. Says she doesn't like water. Told her to drink whatever she liked  -6/10 BUN a little better at 24, CR stable 0.85, encourage fluids  -6/17 labs better today. Will continue IVF for now given her poor po intake 8.  Acute blood loss anemia.  Follow-up hgb 11.8  -encourage appropriate dietary intake  -HGB 11.9- stable overall, continue to monitor 9.  Postoperative delirium.  Improved.  Consider outpatient dementia screening.  -pt reports that period of confusion was very scary for her 10.  Hypertension.  Norvasc 5 mg daily, Toprol-XL 12.5 mg daily.  Monitor with increased mobility  -6/7 bp well controlled  - 6/8: Diastolic hypotension. DC norvasc, encourage fluids.    -6/10 BP a little soft at times but stable overall, norvasc was stopped  6/17 fair to good control==no changes today     01/18/2023    3:18 AM 01/17/2023    7:32 PM 01/17/2023   12:53 PM  Vitals with BMI  Systolic 154 138 045  Diastolic 65 54 68  Pulse 60 65 61     11.  History of left breast cancer.  Continue from area.  Follow-up outpatient oncology 12.  Prediabetes.  SSI.  Latest hemoglobin A1c 5.6-6.3.  -sugars all nearly normal--dc cbg checks and ssi         13.  History of interstitial cystitis with concurrent incontinence.  Admission urinalysis negative.  Pure wick discontinued  -pt is voiding continently 14.  Constipation.  MiraLAX daily, Senokot/Colace twice daily.   -LBM 6/5 by chart documentation.  - 6/10 LBM today, continue to monitor -6/13 LBM today, improved 6/15 BMs have been small and hard at times, increase MiraLAX to twice daily 6/16 has been refusing MiraLAX intermittently, did take today 6/17-still no bm since 6/13- continue to encourage meds--see #17 15.  Urge incontinence.  Not documented, patient endorsing at nighttime.  Check PVRs.  -6/9: PVRs remain low.  Patient endorsing longstanding history of urethral strictures requiring intermittent dilation, has not seen the urologist for workup in quite some time.  Was never on prescribed medication, took an over-the-counter supplement for this.  Denies dysuria.  Encouraged patient to get outpatient follow-up with urology if symptoms are worsening, did briefly discuss possible medications but patient did not indicate interest in starting.  -6/12 start low dose oxybut  2.5mg  BID  6/13 increase oxybutynin to 5mg  BID, purwick at night if needed for 2 nights  6/16 remains continent, will hold oxybutynin as it can decrease GI motility  16. Chronic insomnia  -She declines medication change, encourage sleep hygiene   17.  Indigestion/abdominal gas/RUQ pain  -Start Gas-X, Pepto-Bismol as needed  -Patient reports she  is told she has a hernia, hiatal?.  Symptoms do sound consistent with hiatal hernia. Abdomen x-ray without acute changes. ,She was able take liquid Pepcid last night, declined protonix.  Reports she feels better today but has not had much p.o. intake this morning, will check labs  CBC, CMP  -Addendum, continues to have poor tolerance of PO intake, drinking small amount of ensure,  denies GI discomfort at this time but says "as soon as I eat I will get it", just pt took protonix, will check CXR, IVF NS 48ml/Hr started due to poor PO intake,    -KUB only shows some calcifications over pelvis which could be kidney stones. No excessive stool  -6/17 given persistent RUQ pain, intermittent emesis, sl increased t-bilirubin, and the fact that she fell on her right side, we'll check a CT of the abdomen.    -also adding LFT's on to this morning's lab work, check amylase and lipase   -continue PO as possible, change to clear liquid diet   -continue IVF   LOS: 11 days A FACE TO FACE EVALUATION WAS PERFORMED  Ranelle Oyster 01/18/2023, 10:36 AM

## 2023-01-19 ENCOUNTER — Inpatient Hospital Stay (HOSPITAL_COMMUNITY): Payer: Medicare Other

## 2023-01-19 MED ORDER — SIMETHICONE 40 MG/0.6ML PO SUSP: 80.0000 mg | Freq: Four times a day (QID) | ORAL | 0 refills | Status: DC

## 2023-01-19 MED ORDER — METOCLOPRAMIDE HCL 5 MG/5ML PO SOLN: 5.0000 mg | Freq: Three times a day (TID) | ORAL | 0 refills | Status: AC

## 2023-01-19 MED ORDER — SIMETHICONE 40 MG/0.6ML PO SUSP: 80.0000 mg | Freq: Four times a day (QID) | ORAL | 0 refills | Status: AC | PRN

## 2023-01-19 NOTE — Progress Notes (Signed)
Patient ID: [name] [name], female   DOB: 1931-12-26, 89 y.o.   MRN: [id number]  Met with pt who is awaiting the results of the test she had this am. She wonders why she stopped being able to  eat solid food and hopes it is not due to anxiety. She did mention a hernia she had and wonders fi this is acting up since she fell and was hit by the recycling can top in the back. Her son is awaiting to hear from [facility]. He has taken some of the equipment delivered to her home. She has not eaten solid food for seven days according to her and is probably weaker as a result. She will await to talk with the MD regarding test results.

## 2023-01-19 NOTE — Progress Notes (Signed)
Occupational Therapy Discharge Summary  Patient Details  Name: [name] [name] MRN: [id number] Date of Birth: [birth year]  Date of Discharge from OT service:January 19, 2023  Today's Date: 01/19/2023 OT Individual Time: 4098-1191 OT Individual Time Calculation (min): 75 min    Patient has met 8 of 8 long term goals due to improved activity tolerance, improved balance, postural control, ability to compensate for deficits, functional use of  RIGHT lower extremity, and improved coordination.  Patient to discharge at overall Supervision level.  Patient's care partner is independent to provide the necessary physical assistance at discharge.    Reasons goals not met: n/a   Recommendation:  Patient will benefit from ongoing skilled OT services in home health setting to continue to advance functional skills in the area of BADL, iADL, and Reduce care partner burden.  Equipment: TTB, LH sponge, RW bag, family ordering hip kit   Reasons for discharge: treatment goals met  Patient/family agrees with progress made and goals achieved: Yes  OT Discharge Precautions/Restrictions  Precautions Precautions: Fall Precaution Comments: HOH Restrictions Weight Bearing Restrictions: Yes RLE Weight Bearing: Weight bearing as tolerated Other Position/Activity Restrictions: hearing impaired   Pain Pain Assessment Pain Scale: 0-10 Pain Score: 0-No pain Patients Stated Pain Goal: 0 ADL ADL Equipment Provided: Long-handled sponge Eating: Independent Where Assessed-Eating: Chair Grooming: Modified independent Where Assessed-Grooming: Sitting at sink, Standing at sink Upper Body Bathing: Modified independent Where Assessed-Upper Body Bathing: Shower, Standing at sink, Sitting at sink Lower Body Bathing: Modified independent Where Assessed-Lower Body Bathing: Sitting at sink, Standing at sink, Water quality scientist Dressing: Modified independent (Device) Where Assessed-Upper Body Dressing:  Wheelchair Lower Body Dressing: Modified independent Where Assessed-Lower Body Dressing: Standing at sink, Sitting at sink Toileting: Modified independent Where Assessed-Toileting: Teacher, adult education: Furniture conservator/restorer Method: Proofreader: Engineer, technical sales: Close supervison Web designer Method: Ship broker: Insurance underwriter: Modified independent Film/video editor Method: Designer, industrial/product: Information systems manager with back ADL Comments: overall mod I for BADL's, S for amb transfers with RW and showers wiht LH sponge and TTB as well as simple home making and light meal prep with RW bag Vision Baseline Vision/History: 1 Wears glasses Patient Visual Report: No change from baseline Vision Assessment?: No apparent visual deficits Perception  Perception: Within Functional Limits Praxis Praxis: Intact Cognition Cognition Overall Cognitive Status: Within Functional Limits for tasks assessed Arousal/Alertness: Awake/alert Orientation Level: Person;Place;Situation Memory: Appears intact Attention: Selective Sustained Attention: Appears intact Problem Solving: Appears intact Safety/Judgment: Appears intact Brief Interview for Mental Status (BIMS) Repetition of Three Words (First Attempt): 3 Temporal Orientation: Year: Correct Temporal Orientation: Month: Accurate within 5 days Temporal Orientation: Day: Correct Recall: "Sock": Yes, no cue required Recall: "Blue": Yes, no cue required Recall: "Bed": Yes, no cue required BIMS Summary Score: 15 Sensation Sensation Light Touch: Appears Intact Hot/Cold: Appears Intact Stereognosis: Appears Intact Coordination Gross Motor Movements are Fluid and Coordinated: Yes Fine Motor Movements are Fluid and Coordinated: Yes Coordination and Movement Description: slowed but intact Finger Nose Finger Test: WNL Motor   Motor Motor: Within Functional Limits Motor - Skilled Clinical Observations: general muscle weakness due to hip fracture Mobility  Bed Mobility Bed Mobility: Rolling Right;Rolling Left;Supine to Sit Rolling Right: Independent Rolling Left: Independent Right Sidelying to Sit: Independent Supine to Sit: Independent Transfers Sit to Stand: Independent with assistive device  Trunk/Postural Assessment  Cervical Assessment Cervical Assessment: Within Functional Limits Thoracic Assessment Thoracic Assessment: Within Functional  Limits Lumbar Assessment Lumbar Assessment: Within Functional Limits Postural Control Postural Control: Deficits on evaluation Righting Reactions: decreased Protective Responses: decreased Postural Limitations: decreased  Balance Balance Balance Assessed: Yes Static Sitting Balance Static Sitting - Balance Support: Feet supported Static Sitting - Level of Assistance: 7: Independent Dynamic Sitting Balance Dynamic Sitting - Balance Support: Feet supported Dynamic Sitting - Level of Assistance: 6: Modified independent (Device/Increase time) Dynamic Sitting - Balance Activities: Trunk control activities;Lateral lean/weight shifting;Reaching for objects;Forward lean/weight shifting;Reaching across midline Static Standing Balance Static Standing - Balance Support: Bilateral upper extremity supported Static Standing - Level of Assistance: 6: Modified independent (Device/Increase time) Dynamic Standing Balance Dynamic Standing - Balance Support: Bilateral upper extremity supported Dynamic Standing - Level of Assistance: 6: Modified independent (Device/Increase time) Dynamic Standing - Balance Activities: Lateral lean/weight shifting;Forward lean/weight shifting Extremity/Trunk Assessment   01/19/23 1242  Cervical Assessment  Cervical Assessment [facility]  Thoracic Assessment  Thoracic Assessment WFL  Lumbar Assessment  Lumbar Assessment WFL  Postural Control   Postural Control Deficits on evaluation  Righting Reactions decreased  Protective Responses decreased  Postural Limitations decreased   OT Training/Intervention:  Pt seen for final OT session this am. Discharge education and training conducted with full ADL, toileting, mobility and light IADL's using RW with walker bag. Pt completed full shower, toileting, UB/LB bathing, dressing and grooming mod I including set up of items and use of AE. Pt reports she feels her issues with swallowing may be related to s/s of anxiety. OT educated on falls and use of RW, bag and AE safety. Pt amb throughout session with RW, stood for grooming and ended session in recliner with reports of no pain. Care coord with MD and nursing pt's current OT status as pt awaiting for results of testing prior to d/c home. No further OT needs at this time.    [name] [name] 01/19/2023, 12:43 PM

## 2023-01-19 NOTE — Progress Notes (Signed)
PROGRESS NOTE   Subjective/Complaints:  Ms. [name] [name] reports continued upper abdominal discomfort mostly after eating.  She had a CT scan completed, results pending.  Patient is very anxious about going home and she feels like this could be possible cause of GI discomfort.  ROS: Patient denies fever, rash, sore throat, blurred vision, dizziness,   diarrhea, cough, shortness of breath or chest pain, joint or back/neck pain, headache, or mood change.  +anxiety +abdominal discomfort with eating   Objective:   DG Chest 2 View  Result Date: 01/17/2023 CLINICAL DATA:  Nausea EXAM: CHEST - 2 VIEW COMPARISON:  Chest x-ray 532 one thousand twenty-fourth FINDINGS: Heart is mildly enlarged. The lungs are clear. There is a small left pleural effusion. No pneumothorax. Surgical clips are seen in the left axilla. No acute fractures are identified. IMPRESSION: 1. Small left pleural effusion. 2. Mild cardiomegaly. Electronically Signed   By: Darliss Cheney M.D.   On: 01/17/2023 19:36   Recent Labs    01/17/23 1208 01/18/23 0613  WBC 9.5 6.5  HGB 13.0 11.9*  HCT 41.4 37.3  PLT 278 218    Recent Labs    01/17/23 1208 01/18/23 0613  NA 138 140  K 4.1 4.0  CL 99 103  CO2 23 24  GLUCOSE 140* 99  BUN 20 17  CREATININE 1.01* 0.70  CALCIUM 9.2 8.5*     Intake/Output Summary (Last 24 hours) at 01/19/2023 0824 Last data filed at 01/19/2023 0445 Gross per 24 hour  Intake 237 ml  Output 100 ml  Net 137 ml         Physical Exam: Vital Signs Blood pressure (!) 145/72, pulse 64, temperature 98.6 F (37 C), temperature source Oral, resp. rate 17, height 5\' 2"  (1.575 m), weight 52.9 kg, SpO2 94 %.  Constitutional: No distress . Vital signs reviewed. HEENT: NCAT, EOMI, oral membranes moist Neck: supple Cardiovascular: RRR without murmur. No JVD    Respiratory/Chest: CTA Bilaterally without wheezes or rales. Normal effort    GI/Abdomen:  BS +, non-tender" it does not hurt when you press on it only when I eat." , soft, No distention.    Ext: no clubbing, cyanosis, or edema Psych: pleasant and cooperative  Skin: Clean and intact without signs of breakdown, bruising right thigh. Staples have been removed. Incisions appear to be healing well- no surrounding redness or signs of infection  Neuro:  Alert and oriented x 3. Normal insight and awareness. Intact Memory. Normal language and speech. Cranial nerve exam unremarkable. MMT: UE 5/5. LLE 4/5. RLE 3 to 4/5 HF, 4+/5 KE . No focal sensory findings.   Musculoskeletal: right hip tenderness-improving   Assessment/Plan: 1. Functional deficits which require 3+ hours per day of interdisciplinary therapy in a comprehensive inpatient rehab setting. Physiatrist is providing close team supervision and 24 hour management of active medical problems listed below. Physiatrist and rehab team continue to assess barriers to discharge/monitor patient progress toward functional and medical goals  Care Tool:  Bathing  Bathing activity did not occur: Refused           Bathing assist       Upper Body Dressing/Undressing Upper body dressing  What is the patient wearing?: Dress    Upper body assist Assist Level: Supervision/Verbal cueing    Lower Body Dressing/Undressing Lower body dressing      What is the patient wearing?: Underwear/pull up     Lower body assist Assist for lower body dressing: Moderate Assistance - Patient 50 - 74%     Toileting Toileting    Toileting assist Assist for toileting: Contact Guard/Touching assist     Transfers Chair/bed transfer  Transfers assist     Chair/bed transfer assist level: Independent with assistive device     Locomotion Ambulation   Ambulation assist      Assist level: Independent with assistive device Assistive device: Walker-rolling Max distance: 150   Walk 10 feet activity   Assist     Assist level:  Independent with assistive device Assistive device: Walker-rolling   Walk 50 feet activity   Assist Walk 50 feet with 2 turns activity did not occur: Safety/medical concerns  Assist level: Independent with assistive device Assistive device: Walker-rolling    Walk 150 feet activity   Assist Walk 150 feet activity did not occur: Safety/medical concerns  Assist level: Independent with assistive device Assistive device: Walker-rolling    Walk 10 feet on uneven surface  activity   Assist Walk 10 feet on uneven surfaces activity did not occur: Safety/medical concerns   Assist level: Contact Guard/Touching assist     Wheelchair     Assist Is the patient using a wheelchair?: Yes Type of Wheelchair: Manual    Wheelchair assist level: Dependent - Patient 0% Max wheelchair distance: 10'    Wheelchair 50 feet with 2 turns activity    Assist        Assist Level: Dependent - Patient 0%   Wheelchair 150 feet activity     Assist      Assist Level: Dependent - Patient 0%   Blood pressure (!) 145/72, pulse 64, temperature 98.6 F (37 C), temperature source Oral, resp. rate 17, height 5\' 2"  (1.575 m), weight 52.9 kg, SpO2 94 %.  Medical Problem List and Plan: 1. Functional deficits secondary to right intertrochanteric femur fracture status post intramedullary fixation 01/01/2023.   -Weightbearing as tolerated             -holding discharge today due to RUQ pain.   -spoke to son. Will take it day by day based on her clinical status,  work up and findings.  -CT scan abdomen - results pending  2.  Antithrombotics: -DVT/anticoagulation:  Pharmaceutical: Lovenox check vascular study.  Patient can transition to aspirin therapy on discharge for DVT prophylaxis             -antiplatelet therapy: N/A 3. Pain Management: Hydrocodone/Robaxin as needed  -pain is well controlled 4. Mood/Behavior/Sleep: Provide emotional support             -antipsychotic agents:  Geodon as needed  -pt is positive and in good spirits presently  6/15 pain is controlled, she uses occasional as needed Vicodin, continue for now  6/16-17 she reports her hip pain is well-controlled 5. Neuropsych/cognition: This patient is capable of making decisions on her own behalf. 6. Skin/Wound Care: Routine skin checks. Continue post-op dressing 7. Fluids/Electrolytes/Nutrition: encourage PO  -BUN sl increased. Discussed fluid intake with pt. Says she doesn't like water. Told her to drink whatever she liked  -6/10 BUN a little better at 24, CR stable 0.85, encourage fluids  -6/17 labs better today. Will continue IVF for now given her  poor po intake  -6/18 continue IVF for now 8.  Acute blood loss anemia.  Follow-up hgb 11.8  -encourage appropriate dietary intake  -HGB 11.9- stable overall, continue to monitor 9.  Postoperative delirium.  Improved.  Consider outpatient dementia screening.  -pt reports that period of confusion was very scary for her 10.  Hypertension.  Norvasc 5 mg daily, Toprol-XL 12.5 mg daily.  Monitor with increased mobility  -6/7 bp well controlled  - 6/8: Diastolic hypotension. DC norvasc, encourage fluids.   -6/10 BP a little soft at times but stable overall, norvasc was stopped  6/17 fair to good control==no changes today  6/18 a little higher today but fair control overall, continue to monitor for now     01/19/2023    4:38 AM 01/18/2023    7:32 PM 01/18/2023    1:12 PM  Vitals with BMI  Systolic 145 156 161  Diastolic 72 71 78  Pulse 64 73 73     11.  History of left breast cancer.  Continue from area.  Follow-up outpatient oncology 12.  Prediabetes.  SSI.  Latest hemoglobin A1c 5.6-6.3.  -sugars all nearly normal--dc cbg checks and ssi         13.  History of interstitial cystitis with concurrent incontinence.  Admission urinalysis negative.  Pure wick discontinued  -pt is voiding continently 14.  Constipation.  MiraLAX daily, Senokot/Colace  twice daily.   -LBM 6/5 by chart documentation.  - 6/10 LBM today, continue to monitor -6/13 LBM today, improved 6/15 BMs have been small and hard at times, increase MiraLAX to twice daily 6/16 has been refusing MiraLAX intermittently, did take today 6/17-still no bm since 6/13- continue to encourage meds--see #17 15.  Urge incontinence.  Not documented, patient endorsing at nighttime.  Check PVRs.  -6/9: PVRs remain low.  Patient endorsing longstanding history of urethral strictures requiring intermittent dilation, has not seen the urologist for workup in quite some time.  Was never on prescribed medication, took an over-the-counter supplement for this.  Denies dysuria.  Encouraged patient to get outpatient follow-up with urology if symptoms are worsening, did briefly discuss possible medications but patient did not indicate interest in starting.  -6/12 start low dose oxybut  2.5mg  BID  6/13 increase oxybutynin to 5mg  BID, purwick at night if needed for 2 nights  6/16 remains continent, will hold oxybutynin as it can decrease GI motility  -6/18 recommend urology f/u outpatient  16. Chronic insomnia  -She declines medication change, encourage sleep hygiene   17.  Indigestion/abdominal gas/RUQ pain  -Start Gas-X, Pepto-Bismol as needed  -Patient reports she is told she has a hernia, hiatal?.  Symptoms do sound consistent with hiatal hernia. Abdomen x-ray without acute changes. ,She was able take liquid Pepcid last night, declined protonix.  Reports she feels better today but has not had much p.o. intake this morning, will check labs CBC, CMP  -Addendum, continues to have poor tolerance of PO intake, drinking small amount of ensure,  denies GI discomfort at this time but says "as soon as I eat I will get it", just pt took protonix, will check CXR, IVF NS 61ml/Hr started due to poor PO intake,    -KUB only shows some calcifications over pelvis which could be kidney stones. No excessive  stool  -6/17 given persistent RUQ pain, intermittent emesis, sl increased t-bilirubin, and the fact that she fell on her right side, we'll check a CT of the abdomen.    -also adding  LFT's on to this morning's lab work, check amylase and lipase   -continue PO as possible, change to clear liquid diet   -continue IVF  -6/18 CT abdomen completed - results pending   LOS: 12 days A FACE TO FACE EVALUATION WAS PERFORMED  Fanny Dance 01/19/2023, 8:24 AM

## 2023-01-20 NOTE — Progress Notes (Signed)
Patient ID: [name] [name], female   DOB: 1932/01/07, 89 y.o.   MRN: [id number]  Pt medically stable for discharge today, according to Dan-PA. Asked either he or MD to reach out to son to inform and answer his medical questions.

## 2023-01-20 NOTE — Progress Notes (Addendum)
PROGRESS NOTE   Subjective/Complaints:  Pt has been eating about 50% of her meals.  Reports no pain with eating breakfast this AM. Reports she feels ready to go home today.   ROS: Patient denies fever, rash, sore throat, blurred vision, dizziness,   diarrhea, cough, shortness of breath or chest pain, joint or back/neck pain, headache, or mood change.  +anxiety +abdominal discomfort with eating-resolved   Objective:   CT ABDOMEN PELVIS WO CONTRAST  Result Date: 01/19/2023 CLINICAL DATA:  Right upper quadrant abdominal pain after eating for 3-4 days. Recent hip fracture. EXAM: CT ABDOMEN AND PELVIS WITHOUT CONTRAST TECHNIQUE: Multidetector CT imaging of the abdomen and pelvis was performed following the standard protocol without IV contrast. RADIATION DOSE REDUCTION: This exam was performed according to the departmental dose-optimization program which includes automated exposure control, adjustment of the mA and/or kV according to patient size and/or use of iterative reconstruction technique. COMPARISON:  Abdominal radiographs 01/16/2023 FINDINGS: Lower chest: Partially visualized moderate-sized sliding hiatal hernia. Left greater than right basilar atelectasis/scarring. Small left pleural effusion. Hepatobiliary: No focal liver abnormality is seen on this unenhanced study. No calcified gallstones or gross pericholecystic inflammation allowing for mild motion artifact. No biliary dilatation. Pancreas: Unremarkable. Spleen: Unremarkable. Adrenals/Urinary Tract: Grossly unremarkable adrenal glands within limitations of motion artifact. 5 mm hyperdense foci in both kidneys likely reflecting hemorrhagic cysts as well as an 8.5 cm low-density cyst in the right kidney with no follow-up imaging recommended. No renal calculi or hydronephrosis. No evidence of ureteral calculi. Unremarkable bladder. Stomach/Bowel: Hiatal hernia as noted above. No evidence  of bowel obstruction or inflammation. Unremarkable appendix. Vascular/Lymphatic: Diffuse abdominal aortic atherosclerosis without aneurysm. No enlarged lymph nodes. Reproductive: Status post hysterectomy. No adnexal masses. Other: No ascites or pneumoperitoneum. Musculoskeletal: Recent ORIF of an intertrochanteric fracture of the right femur. Partial right SI joint ankylosis. Thoracolumbar scoliosis with up to moderate disc degeneration and advanced facet arthrosis. IMPRESSION: 1. No acute abnormality identified in the abdomen or pelvis. 2. Moderate-sized hiatal hernia. 3. Small left pleural effusion. 4.  Aortic Atherosclerosis (ICD10-I70.0). Electronically Signed   By: Sebastian Ache M.D.   On: 01/19/2023 11:55   Recent Labs    01/17/23 1208 01/18/23 0613  WBC 9.5 6.5  HGB 13.0 11.9*  HCT 41.4 37.3  PLT 278 218    Recent Labs    01/17/23 1208 01/18/23 0613  NA 138 140  K 4.1 4.0  CL 99 103  CO2 23 24  GLUCOSE 140* 99  BUN 20 17  CREATININE 1.01* 0.70  CALCIUM 9.2 8.5*     Intake/Output Summary (Last 24 hours) at 01/20/2023 0843 Last data filed at 01/20/2023 0756 Gross per 24 hour  Intake 700 ml  Output --  Net 700 ml         Physical Exam: Vital Signs Blood pressure (!) 141/69, pulse (!) 59, temperature 98 F (36.7 C), resp. rate 16, height 5\' 2"  (1.575 m), weight 52.9 kg, SpO2 95 %.  Constitutional: No distress . Vital signs reviewed. HEENT: NCAT, EOMI, oral membranes moist Neck: supple Cardiovascular: RRR without murmur. No JVD    Respiratory/Chest: CTA Bilaterally without wheezes or rales. Normal effort  GI/Abdomen: BS +, non-tender, normoactive BS , soft, No distention.    Ext: no clubbing, cyanosis, or edema Psych: pleasant and cooperative , a little anxious  Skin: Clean and intact without signs of breakdown, bruising right thigh. Staples have been removed. Incisions appear to be healing well- no surrounding redness or signs of infection  Neuro:  Alert and  oriented x 3. Normal insight and awareness. Intact Memory. Normal language and speech. Cranial nerve exam unremarkable. MMT: UE 5/5. LLE 4/5. RLE 3 to 4/5 HF, 4+/5 KE . No focal sensory findings.   Musculoskeletal: right hip tenderness- improved   Assessment/Plan: 1. Functional deficits which require 3+ hours per day of interdisciplinary therapy in a comprehensive inpatient rehab setting. Physiatrist is providing close team supervision and 24 hour management of active medical problems listed below. Physiatrist and rehab team continue to assess barriers to discharge/monitor patient progress toward functional and medical goals  Care Tool:  Bathing  Bathing activity did not occur: Refused           Bathing assist       Upper Body Dressing/Undressing Upper body dressing   What is the patient wearing?: Dress    Upper body assist Assist Level: Supervision/Verbal cueing    Lower Body Dressing/Undressing Lower body dressing      What is the patient wearing?: Underwear/pull up     Lower body assist Assist for lower body dressing: Moderate Assistance - Patient 50 - 74%     Toileting Toileting    Toileting assist Assist for toileting: Contact Guard/Touching assist     Transfers Chair/bed transfer  Transfers assist     Chair/bed transfer assist level: Independent with assistive device     Locomotion Ambulation   Ambulation assist      Assist level: Independent with assistive device Assistive device: Walker-rolling Max distance: 150   Walk 10 feet activity   Assist     Assist level: Independent with assistive device Assistive device: Walker-rolling   Walk 50 feet activity   Assist Walk 50 feet with 2 turns activity did not occur: Safety/medical concerns  Assist level: Independent with assistive device Assistive device: Walker-rolling    Walk 150 feet activity   Assist Walk 150 feet activity did not occur: Safety/medical concerns  Assist level:  Independent with assistive device Assistive device: Walker-rolling    Walk 10 feet on uneven surface  activity   Assist Walk 10 feet on uneven surfaces activity did not occur: Safety/medical concerns   Assist level: Contact Guard/Touching assist     Wheelchair     Assist Is the patient using a wheelchair?: Yes Type of Wheelchair: Manual    Wheelchair assist level: Dependent - Patient 0% Max wheelchair distance: 10'    Wheelchair 50 feet with 2 turns activity    Assist        Assist Level: Dependent - Patient 0%   Wheelchair 150 feet activity     Assist      Assist Level: Dependent - Patient 0%   Blood pressure (!) 141/69, pulse (!) 59, temperature 98 F (36.7 C), resp. rate 16, height 5\' 2"  (1.575 m), weight 52.9 kg, SpO2 95 %.  Medical Problem List and Plan: 1. Functional deficits secondary to right intertrochanteric femur fracture status post intramedullary fixation 01/01/2023.   -Weightbearing as tolerated             -holding discharge today due to RUQ pain.   -spoke to son. Will take it day by day  based on her clinical status,  work up and findings.  -CT scan abdomen - Hiatal hernia  -Eating better plan for DC today  2.  Antithrombotics: -DVT/anticoagulation:  Pharmaceutical: Lovenox check vascular study.  Patient can transition to aspirin therapy on discharge for DVT prophylaxis             -antiplatelet therapy: N/A 3. Pain Management: Hydrocodone/Robaxin as needed  -pain is well controlled 4. Mood/Behavior/Sleep: Provide emotional support             -antipsychotic agents: Geodon as needed  -pt is positive and in good spirits presently  6/15 pain is controlled, she uses occasional as needed Vicodin, continue for now  6/16-17 she reports her hip pain is well-controlled  6/19 pain controlled  5. Neuropsych/cognition: This patient is capable of making decisions on her own behalf. 6. Skin/Wound Care: Routine skin checks. Continue post-op  dressing 7. Fluids/Electrolytes/Nutrition: encourage PO  -BUN sl increased. Discussed fluid intake with pt. Says she doesn't like water. Told her to drink whatever she liked  -6/10 BUN a little better at 24, CR stable 0.85, encourage fluids  -6/17 labs better today. Will continue IVF for now given her poor po intake/azotemia  -6/18 continue IVF for now  6/19 DC IVF, fluid intake improving 8.  Acute blood loss anemia.  Follow-up hgb 11.8  -encourage appropriate dietary intake  -HGB 11.9- stable overall, continue to monitor  Recheck with PCP 9.  Postoperative delirium.  Improved.  Consider outpatient dementia screening.  -pt reports that period of confusion was very scary for her 10.  Hypertension.  Norvasc 5 mg daily, Toprol-XL 12.5 mg daily.  Monitor with increased mobility  -6/7 bp well controlled  - 6/8: Diastolic hypotension. DC norvasc, encourage fluids.   -6/10 BP a little soft at times but stable overall, norvasc was stopped  6/17 fair to good control==no changes today  6/19 Controlled, continue current regimen and f/u PCP     01/20/2023    5:52 AM 01/19/2023    7:57 PM 01/19/2023    2:13 PM  Vitals with BMI  Systolic 141 128 161  Diastolic 69 55 83  Pulse 59 44 67     11.  History of left breast cancer.  Continue from area.  Follow-up outpatient oncology 12.  Prediabetes.  SSI.  Latest hemoglobin A1c 5.6-6.3.  -sugars all nearly normal--dc cbg checks and ssi         13.  History of interstitial cystitis with concurrent incontinence.  Admission urinalysis negative.  Pure wick discontinued  -pt is voiding continently 14.  Constipation.  MiraLAX daily, Senokot/Colace twice daily.   -LBM 6/5 by chart documentation.  - 6/10 LBM today, continue to monitor -6/13 LBM today, improved 6/15 BMs have been small and hard at times, increase MiraLAX to twice daily 6/16 has been refusing MiraLAX intermittently, did take today 6/17-still no bm since 6/13- continue to encourage  meds--see #17 LBM 6/17-continue laxatives 15.  Urge incontinence.  Not documented, patient endorsing at nighttime.  Check PVRs.  -6/9: PVRs remain low.  Patient endorsing longstanding history of urethral strictures requiring intermittent dilation, has not seen the urologist for workup in quite some time.  Was never on prescribed medication, took an over-the-counter supplement for this.  Denies dysuria.  Encouraged patient to get outpatient follow-up with urology if symptoms are worsening, did briefly discuss possible medications but patient did not indicate interest in starting.  -6/12 start low dose oxybut  2.5mg  BID  6/13 increase oxybutynin to 5mg  BID, purwick at night if needed for 2 nights  6/16 remains continent, will hold oxybutynin as it can decrease GI motility  -6/18 recommend urology f/u outpatient  -6/19 hold oxybutynin, may benefit restart as outpatient, f/u with urology  16. Chronic insomnia  -She declines medication change, encourage sleep hygiene   17.  Indigestion/abdominal gas/RUQ pain  -Start Gas-X, Pepto-Bismol as needed  -Patient reports she is told she has a hernia, hiatal?.  Symptoms do sound consistent with hiatal hernia. Abdomen x-ray without acute changes. ,She was able take liquid Pepcid last night, declined protonix.  Reports she feels better today but has not had much p.o. intake this morning, will check labs CBC, CMP  -Addendum, continues to have poor tolerance of PO intake, drinking small amount of ensure,  denies GI discomfort at this time but says "as soon as I eat I will get it", just pt took protonix, will check CXR, IVF NS 5ml/Hr started due to poor PO intake,    -KUB only shows some calcifications over pelvis which could be kidney stones. No excessive stool  -6/17 given persistent RUQ pain, intermittent emesis, sl increased t-bilirubin, and the fact that she fell on her right side, we'll check a CT of the abdomen.    -also adding LFT's on to this morning's lab  work, check amylase and lipase   -continue PO as possible, change to clear liquid diet   -continue IVF  -6/19 CT abdomen completed - hiatal hernia noted, recommend outpatient GI f/u    LOS: 13 days A FACE TO FACE EVALUATION WAS PERFORMED  Fanny Dance 01/20/2023, 8:43 AM

## 2023-01-20 NOTE — Progress Notes (Signed)
Deatra Ina, PA provided discharge instructions and prescriptions sent  to patient's pharmacy of choice.  Staff assisted patient off the unit; patient left via private car with son.    Tilden Dome, LPN

## 2023-01-20 NOTE — Progress Notes (Signed)
Inpatient Rehabilitation Discharge Medication Review by a Pharmacist  A complete drug regimen review was completed for this patient to identify any potential clinically significant medication issues.  High Risk Drug Classes Is patient taking? Indication by Medication  Antipsychotic Yes Geodon- agitation/anxiety/combative  Anticoagulant No   Antibiotic No   Opioid Yes Norco- acute pain  Antiplatelet Yes Aspirin- ortho vte ppx  Hypoglycemics/insulin No   Vasoactive Medication Yes Toprol- HTN  Chemotherapy Yes, Oral Chemotherapy Femera- breast CA  Other Yes Claritin- allergies Robaxin- muscle relaxant Protonix- GERD     Type of Medication Issue Identified Description of Issue Recommendation(s)  Drug Interaction(s) (clinically significant)     Duplicate Therapy     Allergy     No Medication Administration End Date     Incorrect Dose     Additional Drug Therapy Needed     Significant med changes from prior encounter (inform family/care partners about these prior to discharge).    Other       Clinically significant medication issues were identified that warrant physician communication and completion of prescribed/recommended actions by midnight of the next day:  No   Time spent performing this drug regimen review (minutes):  30   Leyani Gargus BS, PharmD, BCPS Clinical Pharmacist 01/20/2023 7:14 AM  Contact: [phone] after 3 PM  "Be curious, not judgmental..." -Debbora Dus

## 2023-01-21 ENCOUNTER — Telehealth: Payer: Self-pay

## 2023-01-21 NOTE — Transitions of Care (Post Inpatient/ED Visit) (Signed)
01/21/2023  Name: [name] [name] MRN: [id number] DOB: 03-May-1932  Today's TOC FU Call Status: Today's TOC FU Call Status:: Successful TOC FU Call Competed TOC FU Call Complete Date: 01/21/23 (Call completed wtih son-Andy)  Transition Care Management Follow-up Telephone Call Date of Discharge: 01/20/23 Discharge Facility: [facility]) Type of Discharge: Inpatient Admission Primary Inpatient Discharge Diagnosis:: "intratrochanteric fracture fo right hip" How have you been since you were released from the hospital?: Better (Son vocies pt doing well and things going fine since returning home. She has been up using walker. No pain reported. She is eating well and going to the bathroom.) Any questions or concerns?: No  Items Reviewed: Did you receive and understand the discharge instructions provided?: Yes Medications obtained,verified, and reconciled?: Yes (Medications Reviewed) Any new allergies since your discharge?: No Dietary orders reviewed?: Yes Type of Diet Ordered:: lowsalt/heart healthy Do you have support at home?: Yes People in Home: child(ren), adult, spouse Name of Support/Comfort Primary Source: son has hired caregivers to assist from 12N-6pm  Medications Reviewed Today: Medications Reviewed Today     Reviewed by Charlyn Minerva, RN (Registered Nurse) on 01/21/23 at 1237  Med List Status: <None>   Medication Order Taking? Sig Documenting Provider Last Dose Status Informant  acetaminophen (TYLENOL) 325 MG tablet [id number] Yes Take 1-2 tablets (325-650 mg total) by mouth every 6 (six) hours as needed for mild pain (pain score 1-3 or temp > 100.5). Charlton Amor, PA-C Taking Active   Ascorbic Acid (VITAMIN C) 500 MG tablet [id number] Yes Take 500 mg by mouth daily. [provider] Taking Active Self  aspirin EC 81 MG tablet [id number] Yes Take 1 tablet (81 mg total) by mouth 2 (two) times daily. Swallow whole. Charlton Amor, PA-C Taking Active    Calcium Carb-Cholecalciferol 600-800 MG-UNIT TABS [id number] Yes Take 2 tablets by mouth daily. [provider] Taking Active Self  HYDROcodone-acetaminophen (NORCO/VICODIN) 5-325 MG tablet [id number] Yes Take 1-2 tablets by mouth every 4 (four) hours as needed for moderate pain (pain score 4-6). Charlton Amor, PA-C Taking Active   letrozole [facility]) 2.5 MG tablet [id number] Yes TAKE 1 TABLET(2.5 MG) BY MOUTH DAILY Serena Croissant, MD Taking Active Self           Med Note Jola Schmidt   Fri Jan 01, 2023  8:18 AM) Recent dispense, but states she does not take.   loratadine (CLARITIN) 10 MG tablet [id number] Yes Take 10 mg by mouth daily as needed for allergies. [provider] Taking Active Self  methocarbamol (ROBAXIN) 500 MG tablet [id number] Yes Take 1 tablet (500 mg total) by mouth every 6 (six) hours as needed for muscle spasms. Charlton Amor, PA-C Taking Active   metoCLOPramide (REGLAN) 5 MG/5ML solution [id number] Yes Take 5 mLs (5 mg total) by mouth 3 (three) times daily before meals. Charlton Amor, PA-C Taking Active   metoprolol succinate (TOPROL-XL) 25 MG 24 hr tablet [id number] Yes Take 0.5 tablets (12.5 mg total) by mouth daily. Charlton Amor, PA-C Taking Active   Multiple Vitamins-Minerals (CENTRUM SILVER PO) [id number] Yes Take 1 tablet by mouth daily. [provider] Taking Active Self  pantoprazole (PROTONIX) 40 MG tablet [id number] Yes Take 1 tablet (40 mg total) by mouth daily. Charlton Amor, PA-C Taking Active   polyethylene glycol (MIRALAX / GLYCOLAX) 17 g packet [id number] Yes Take 17 g by mouth daily. Rai, Delene Ruffini, MD Taking Active Self  simethicone (MYLICON) 40 MG/0.6ML drops  [id number] Yes Take 1.2 mLs (80 mg total) by mouth 4 (four) times daily as needed for flatulence. Charlton Amor, PA-C Taking Active   ziprasidone (GEODON) 20 MG capsule [id number] Yes Take 1 capsule (20 mg total) by mouth 3 times/day as needed-between  meals & bedtime (agitation/anxiety/combative). Charlton Amor, PA-C Taking Active             Home Care and Equipment/Supplies: Were Home Health Services Ordered?: Yes [facility] of [facility] Agency[facility] Has Agency set up a time to come to your home?: Yes First Home Health Visit Date: 01/23/23 Any new equipment or medical supplies ordered?: Yes Name of Medical supply agency?: [employer]-rolling walker,tub bench, 3N1 Were you able to get the equipment/medical supplies?: Yes Do you have any questions related to the use of the equipment/supplies?: No  Functional Questionnaire: Do you need assistance with bathing/showering or dressing?: Yes Do you need assistance with meal preparation?: Yes Do you need assistance with eating?: No Do you have difficulty maintaining continence: Yes Do you need assistance with getting out of bed/getting out of a chair/moving?: Yes Do you have difficulty managing or taking your medications?: Yes  Follow up appointments reviewed: PCP Follow-up appointment confirmed?: No (Family prefers to call on their own and make appt) MD Provider Line [phone] Given: No Specialist Hospital Follow-up appointment confirmed?: No Reason Specialist Follow-Up Not Confirmed: Patient has Specialist Provider Number and will Call for Appointment (Son will contact ortho MD to make follow up appt) Do you need transportation to your follow-up appointment?: No (Son confirms either family or caregivers will be able to get pt to appts) Do you understand care options if your condition(s) worsen?: Yes-patient verbalized understanding  SDOH Interventions Today    Flowsheet Row Most Recent Value  SDOH Interventions   Food Insecurity Interventions Intervention Not Indicated  Transportation Interventions Intervention Not Indicated      TOC Interventions Today    Flowsheet Row Most Recent Value  TOC Interventions   TOC Interventions Discussed/Reviewed TOC Interventions  Discussed, Post discharge activity limitations per provider      Interventions Today    Flowsheet Row Most Recent Value  General Interventions   General Interventions Discussed/Reviewed General Interventions Discussed, Doctor Visits  Doctor Visits Discussed/Reviewed Doctor Visits Discussed, Specialist, PCP  PCP/Specialist Visits Compliance with follow-up visit  Education Interventions   Education Provided Provided Education  Provided Verbal Education On Nutrition, When to see the doctor, Medication, Other  [pain/sx mgmt]  Nutrition Interventions   Nutrition Discussed/Reviewed Nutrition Discussed  Pharmacy Interventions   Pharmacy Dicussed/Reviewed Pharmacy Topics Discussed, Medications and their functions  Safety Interventions   Safety Discussed/Reviewed Safety Discussed, Home Safety  [Son voices he has hired caregivers in the home from 12N-6pm to assist pt and spouse]  Home Safety Assistive Devices       [place], [place] [facility] Care Management Care Management Community Coordinator Direct Phone: [phone] Toll Free: [phone] Fax: [phone]

## 2023-01-23 DIAGNOSIS — Z86010 Personal history of colonic polyps: Secondary | ICD-10-CM | POA: Diagnosis not present

## 2023-01-23 DIAGNOSIS — F419 Anxiety disorder, unspecified: Secondary | ICD-10-CM | POA: Diagnosis not present

## 2023-01-23 DIAGNOSIS — K449 Diaphragmatic hernia without obstruction or gangrene: Secondary | ICD-10-CM | POA: Diagnosis not present

## 2023-01-23 DIAGNOSIS — I083 Combined rheumatic disorders of mitral, aortic and tricuspid valves: Secondary | ICD-10-CM | POA: Diagnosis not present

## 2023-01-23 DIAGNOSIS — M75100 Unspecified rotator cuff tear or rupture of unspecified shoulder, not specified as traumatic: Secondary | ICD-10-CM | POA: Diagnosis not present

## 2023-01-23 DIAGNOSIS — Z87891 Personal history of nicotine dependence: Secondary | ICD-10-CM | POA: Diagnosis not present

## 2023-01-23 DIAGNOSIS — R7303 Prediabetes: Secondary | ICD-10-CM | POA: Diagnosis not present

## 2023-01-23 DIAGNOSIS — Z9012 Acquired absence of left breast and nipple: Secondary | ICD-10-CM | POA: Diagnosis not present

## 2023-01-23 DIAGNOSIS — S72141D Displaced intertrochanteric fracture of right femur, subsequent encounter for closed fracture with routine healing: Secondary | ICD-10-CM | POA: Diagnosis not present

## 2023-01-23 DIAGNOSIS — Z853 Personal history of malignant neoplasm of breast: Secondary | ICD-10-CM | POA: Diagnosis not present

## 2023-01-23 DIAGNOSIS — Z87440 Personal history of urinary (tract) infections: Secondary | ICD-10-CM | POA: Diagnosis not present

## 2023-01-23 DIAGNOSIS — I119 Hypertensive heart disease without heart failure: Secondary | ICD-10-CM | POA: Diagnosis not present

## 2023-01-23 DIAGNOSIS — M1990 Unspecified osteoarthritis, unspecified site: Secondary | ICD-10-CM | POA: Diagnosis not present

## 2023-01-23 DIAGNOSIS — Z79811 Long term (current) use of aromatase inhibitors: Secondary | ICD-10-CM | POA: Diagnosis not present

## 2023-01-23 DIAGNOSIS — Z9181 History of falling: Secondary | ICD-10-CM | POA: Diagnosis not present

## 2023-01-23 DIAGNOSIS — I700 Atherosclerosis of aorta: Secondary | ICD-10-CM | POA: Diagnosis not present

## 2023-01-23 DIAGNOSIS — E785 Hyperlipidemia, unspecified: Secondary | ICD-10-CM | POA: Diagnosis not present

## 2023-01-23 DIAGNOSIS — D62 Acute posthemorrhagic anemia: Secondary | ICD-10-CM | POA: Diagnosis not present

## 2023-01-23 DIAGNOSIS — J90 Pleural effusion, not elsewhere classified: Secondary | ICD-10-CM | POA: Diagnosis not present

## 2023-01-23 DIAGNOSIS — Z7982 Long term (current) use of aspirin: Secondary | ICD-10-CM | POA: Diagnosis not present

## 2023-01-23 DIAGNOSIS — H9190 Unspecified hearing loss, unspecified ear: Secondary | ICD-10-CM | POA: Diagnosis not present

## 2023-01-26 ENCOUNTER — Telehealth: Payer: Self-pay | Admitting: Internal Medicine

## 2023-01-26 DIAGNOSIS — Z79811 Long term (current) use of aromatase inhibitors: Secondary | ICD-10-CM | POA: Diagnosis not present

## 2023-01-26 DIAGNOSIS — I700 Atherosclerosis of aorta: Secondary | ICD-10-CM | POA: Diagnosis not present

## 2023-01-26 DIAGNOSIS — M75100 Unspecified rotator cuff tear or rupture of unspecified shoulder, not specified as traumatic: Secondary | ICD-10-CM | POA: Diagnosis not present

## 2023-01-26 DIAGNOSIS — Z853 Personal history of malignant neoplasm of breast: Secondary | ICD-10-CM | POA: Diagnosis not present

## 2023-01-26 DIAGNOSIS — F419 Anxiety disorder, unspecified: Secondary | ICD-10-CM | POA: Diagnosis not present

## 2023-01-26 DIAGNOSIS — E785 Hyperlipidemia, unspecified: Secondary | ICD-10-CM | POA: Diagnosis not present

## 2023-01-26 DIAGNOSIS — S72141D Displaced intertrochanteric fracture of right femur, subsequent encounter for closed fracture with routine healing: Secondary | ICD-10-CM | POA: Diagnosis not present

## 2023-01-26 DIAGNOSIS — Z7982 Long term (current) use of aspirin: Secondary | ICD-10-CM | POA: Diagnosis not present

## 2023-01-26 DIAGNOSIS — R7303 Prediabetes: Secondary | ICD-10-CM | POA: Diagnosis not present

## 2023-01-26 DIAGNOSIS — I083 Combined rheumatic disorders of mitral, aortic and tricuspid valves: Secondary | ICD-10-CM | POA: Diagnosis not present

## 2023-01-26 DIAGNOSIS — H9190 Unspecified hearing loss, unspecified ear: Secondary | ICD-10-CM | POA: Diagnosis not present

## 2023-01-26 DIAGNOSIS — M1990 Unspecified osteoarthritis, unspecified site: Secondary | ICD-10-CM | POA: Diagnosis not present

## 2023-01-26 DIAGNOSIS — D62 Acute posthemorrhagic anemia: Secondary | ICD-10-CM | POA: Diagnosis not present

## 2023-01-26 DIAGNOSIS — I119 Hypertensive heart disease without heart failure: Secondary | ICD-10-CM | POA: Diagnosis not present

## 2023-01-26 DIAGNOSIS — J90 Pleural effusion, not elsewhere classified: Secondary | ICD-10-CM | POA: Diagnosis not present

## 2023-01-26 DIAGNOSIS — K449 Diaphragmatic hernia without obstruction or gangrene: Secondary | ICD-10-CM | POA: Diagnosis not present

## 2023-01-26 NOTE — Telephone Encounter (Signed)
Caregiver call and want a refill on Reglan that was prescribe by the Hospital  dr. Albesa Seen. She stated she want it sent to  [id number] [place], [place] - [address] AT [facility] OF [address] [facility] [address] Phone: [phone]  Fax: [phone]

## 2023-01-27 DIAGNOSIS — R7303 Prediabetes: Secondary | ICD-10-CM | POA: Diagnosis not present

## 2023-01-27 DIAGNOSIS — H9190 Unspecified hearing loss, unspecified ear: Secondary | ICD-10-CM | POA: Diagnosis not present

## 2023-01-27 DIAGNOSIS — M1990 Unspecified osteoarthritis, unspecified site: Secondary | ICD-10-CM | POA: Diagnosis not present

## 2023-01-27 DIAGNOSIS — M75100 Unspecified rotator cuff tear or rupture of unspecified shoulder, not specified as traumatic: Secondary | ICD-10-CM | POA: Diagnosis not present

## 2023-01-27 DIAGNOSIS — J90 Pleural effusion, not elsewhere classified: Secondary | ICD-10-CM | POA: Diagnosis not present

## 2023-01-27 DIAGNOSIS — Z853 Personal history of malignant neoplasm of breast: Secondary | ICD-10-CM | POA: Diagnosis not present

## 2023-01-27 DIAGNOSIS — F419 Anxiety disorder, unspecified: Secondary | ICD-10-CM | POA: Diagnosis not present

## 2023-01-27 DIAGNOSIS — I083 Combined rheumatic disorders of mitral, aortic and tricuspid valves: Secondary | ICD-10-CM | POA: Diagnosis not present

## 2023-01-27 DIAGNOSIS — I119 Hypertensive heart disease without heart failure: Secondary | ICD-10-CM | POA: Diagnosis not present

## 2023-01-27 DIAGNOSIS — Z7982 Long term (current) use of aspirin: Secondary | ICD-10-CM | POA: Diagnosis not present

## 2023-01-27 DIAGNOSIS — E785 Hyperlipidemia, unspecified: Secondary | ICD-10-CM | POA: Diagnosis not present

## 2023-01-27 DIAGNOSIS — D62 Acute posthemorrhagic anemia: Secondary | ICD-10-CM | POA: Diagnosis not present

## 2023-01-27 DIAGNOSIS — S72141D Displaced intertrochanteric fracture of right femur, subsequent encounter for closed fracture with routine healing: Secondary | ICD-10-CM | POA: Diagnosis not present

## 2023-01-27 DIAGNOSIS — K449 Diaphragmatic hernia without obstruction or gangrene: Secondary | ICD-10-CM | POA: Diagnosis not present

## 2023-01-27 DIAGNOSIS — I700 Atherosclerosis of aorta: Secondary | ICD-10-CM | POA: Diagnosis not present

## 2023-01-27 DIAGNOSIS — Z79811 Long term (current) use of aromatase inhibitors: Secondary | ICD-10-CM | POA: Diagnosis not present

## 2023-01-27 NOTE — Telephone Encounter (Signed)
This is a high risk medication and I dont usually prescribe . And unclear   reason to me for medication at this time .  If the rehabilitation providers  ordered this they should make an opinion about whether to continue  medicaton . See if her treating team ? Ortho?  Or rehab  ?can order or give [place] opinion .

## 2023-01-28 ENCOUNTER — Telehealth: Payer: Self-pay | Admitting: Internal Medicine

## 2023-01-28 DIAGNOSIS — K449 Diaphragmatic hernia without obstruction or gangrene: Secondary | ICD-10-CM | POA: Diagnosis not present

## 2023-01-28 DIAGNOSIS — I119 Hypertensive heart disease without heart failure: Secondary | ICD-10-CM | POA: Diagnosis not present

## 2023-01-28 DIAGNOSIS — I083 Combined rheumatic disorders of mitral, aortic and tricuspid valves: Secondary | ICD-10-CM | POA: Diagnosis not present

## 2023-01-28 DIAGNOSIS — M1990 Unspecified osteoarthritis, unspecified site: Secondary | ICD-10-CM | POA: Diagnosis not present

## 2023-01-28 DIAGNOSIS — F419 Anxiety disorder, unspecified: Secondary | ICD-10-CM | POA: Diagnosis not present

## 2023-01-28 DIAGNOSIS — Z79811 Long term (current) use of aromatase inhibitors: Secondary | ICD-10-CM | POA: Diagnosis not present

## 2023-01-28 DIAGNOSIS — D62 Acute posthemorrhagic anemia: Secondary | ICD-10-CM | POA: Diagnosis not present

## 2023-01-28 DIAGNOSIS — R7303 Prediabetes: Secondary | ICD-10-CM | POA: Diagnosis not present

## 2023-01-28 DIAGNOSIS — M75100 Unspecified rotator cuff tear or rupture of unspecified shoulder, not specified as traumatic: Secondary | ICD-10-CM | POA: Diagnosis not present

## 2023-01-28 DIAGNOSIS — Z7982 Long term (current) use of aspirin: Secondary | ICD-10-CM | POA: Diagnosis not present

## 2023-01-28 DIAGNOSIS — H9190 Unspecified hearing loss, unspecified ear: Secondary | ICD-10-CM | POA: Diagnosis not present

## 2023-01-28 DIAGNOSIS — S72141D Displaced intertrochanteric fracture of right femur, subsequent encounter for closed fracture with routine healing: Secondary | ICD-10-CM | POA: Diagnosis not present

## 2023-01-28 DIAGNOSIS — I700 Atherosclerosis of aorta: Secondary | ICD-10-CM | POA: Diagnosis not present

## 2023-01-28 DIAGNOSIS — E785 Hyperlipidemia, unspecified: Secondary | ICD-10-CM | POA: Diagnosis not present

## 2023-01-28 DIAGNOSIS — J90 Pleural effusion, not elsewhere classified: Secondary | ICD-10-CM | POA: Diagnosis not present

## 2023-01-28 DIAGNOSIS — Z853 Personal history of malignant neoplasm of breast: Secondary | ICD-10-CM | POA: Diagnosis not present

## 2023-01-28 NOTE — Telephone Encounter (Signed)
Discharged from hospital post hip fracture. Continuing orders for PT 2x4/1x2 balance strength endurance fall prevention

## 2023-01-29 DIAGNOSIS — I083 Combined rheumatic disorders of mitral, aortic and tricuspid valves: Secondary | ICD-10-CM | POA: Diagnosis not present

## 2023-01-29 DIAGNOSIS — Z853 Personal history of malignant neoplasm of breast: Secondary | ICD-10-CM | POA: Diagnosis not present

## 2023-01-29 DIAGNOSIS — H9190 Unspecified hearing loss, unspecified ear: Secondary | ICD-10-CM | POA: Diagnosis not present

## 2023-01-29 DIAGNOSIS — J90 Pleural effusion, not elsewhere classified: Secondary | ICD-10-CM | POA: Diagnosis not present

## 2023-01-29 DIAGNOSIS — I119 Hypertensive heart disease without heart failure: Secondary | ICD-10-CM | POA: Diagnosis not present

## 2023-01-29 DIAGNOSIS — S72141D Displaced intertrochanteric fracture of right femur, subsequent encounter for closed fracture with routine healing: Secondary | ICD-10-CM | POA: Diagnosis not present

## 2023-01-29 DIAGNOSIS — M1990 Unspecified osteoarthritis, unspecified site: Secondary | ICD-10-CM | POA: Diagnosis not present

## 2023-01-29 DIAGNOSIS — D62 Acute posthemorrhagic anemia: Secondary | ICD-10-CM | POA: Diagnosis not present

## 2023-01-29 DIAGNOSIS — Z7982 Long term (current) use of aspirin: Secondary | ICD-10-CM | POA: Diagnosis not present

## 2023-01-29 DIAGNOSIS — F419 Anxiety disorder, unspecified: Secondary | ICD-10-CM | POA: Diagnosis not present

## 2023-01-29 DIAGNOSIS — R7303 Prediabetes: Secondary | ICD-10-CM | POA: Diagnosis not present

## 2023-01-29 DIAGNOSIS — K449 Diaphragmatic hernia without obstruction or gangrene: Secondary | ICD-10-CM | POA: Diagnosis not present

## 2023-01-29 DIAGNOSIS — E785 Hyperlipidemia, unspecified: Secondary | ICD-10-CM | POA: Diagnosis not present

## 2023-01-29 DIAGNOSIS — M75100 Unspecified rotator cuff tear or rupture of unspecified shoulder, not specified as traumatic: Secondary | ICD-10-CM | POA: Diagnosis not present

## 2023-01-29 DIAGNOSIS — I700 Atherosclerosis of aorta: Secondary | ICD-10-CM | POA: Diagnosis not present

## 2023-01-29 DIAGNOSIS — Z79811 Long term (current) use of aromatase inhibitors: Secondary | ICD-10-CM | POA: Diagnosis not present

## 2023-01-29 NOTE — Telephone Encounter (Signed)
Spoke to pt yesterday regarding to the message. Pt reports she has been taking OTC for gas relief. Haven't had to take for few days now.   Would like to a refill. But when ask about if treating team can order it or rehap provider to give opinion, she seem to not understanding it. Also mention to pt about follow up since she was discharge from hospital first week of June. Pt states she is unable to do follow up in person nor virtually as she has no internet. Pt states " forget about it" about refill if provider is going to refill it. Pt hung up.    Wanted to make sure pt understand that we are trying to help. Tried to reach to husband(on DPR) to see if I could speak to him instead, husband passed the called to pt. Again pt states " to forgot about it". Hung up.   Attempted to son Greig Castilla (on [place]) left  a voicemail to speak to him. To return our call.

## 2023-02-01 DIAGNOSIS — R7303 Prediabetes: Secondary | ICD-10-CM | POA: Diagnosis not present

## 2023-02-01 DIAGNOSIS — M1990 Unspecified osteoarthritis, unspecified site: Secondary | ICD-10-CM | POA: Diagnosis not present

## 2023-02-01 DIAGNOSIS — I700 Atherosclerosis of aorta: Secondary | ICD-10-CM | POA: Diagnosis not present

## 2023-02-01 DIAGNOSIS — I119 Hypertensive heart disease without heart failure: Secondary | ICD-10-CM | POA: Diagnosis not present

## 2023-02-01 DIAGNOSIS — E785 Hyperlipidemia, unspecified: Secondary | ICD-10-CM | POA: Diagnosis not present

## 2023-02-01 DIAGNOSIS — I083 Combined rheumatic disorders of mitral, aortic and tricuspid valves: Secondary | ICD-10-CM | POA: Diagnosis not present

## 2023-02-01 DIAGNOSIS — Z853 Personal history of malignant neoplasm of breast: Secondary | ICD-10-CM | POA: Diagnosis not present

## 2023-02-01 DIAGNOSIS — M75100 Unspecified rotator cuff tear or rupture of unspecified shoulder, not specified as traumatic: Secondary | ICD-10-CM | POA: Diagnosis not present

## 2023-02-01 DIAGNOSIS — J90 Pleural effusion, not elsewhere classified: Secondary | ICD-10-CM | POA: Diagnosis not present

## 2023-02-01 DIAGNOSIS — F419 Anxiety disorder, unspecified: Secondary | ICD-10-CM | POA: Diagnosis not present

## 2023-02-01 DIAGNOSIS — K449 Diaphragmatic hernia without obstruction or gangrene: Secondary | ICD-10-CM | POA: Diagnosis not present

## 2023-02-01 DIAGNOSIS — Z7982 Long term (current) use of aspirin: Secondary | ICD-10-CM | POA: Diagnosis not present

## 2023-02-01 DIAGNOSIS — D62 Acute posthemorrhagic anemia: Secondary | ICD-10-CM | POA: Diagnosis not present

## 2023-02-01 DIAGNOSIS — H9190 Unspecified hearing loss, unspecified ear: Secondary | ICD-10-CM | POA: Diagnosis not present

## 2023-02-01 DIAGNOSIS — Z79811 Long term (current) use of aromatase inhibitors: Secondary | ICD-10-CM | POA: Diagnosis not present

## 2023-02-01 DIAGNOSIS — S72141D Displaced intertrochanteric fracture of right femur, subsequent encounter for closed fracture with routine healing: Secondary | ICD-10-CM | POA: Diagnosis not present

## 2023-02-02 DIAGNOSIS — M75100 Unspecified rotator cuff tear or rupture of unspecified shoulder, not specified as traumatic: Secondary | ICD-10-CM | POA: Diagnosis not present

## 2023-02-02 DIAGNOSIS — F419 Anxiety disorder, unspecified: Secondary | ICD-10-CM | POA: Diagnosis not present

## 2023-02-02 DIAGNOSIS — S72141D Displaced intertrochanteric fracture of right femur, subsequent encounter for closed fracture with routine healing: Secondary | ICD-10-CM | POA: Diagnosis not present

## 2023-02-02 DIAGNOSIS — Z853 Personal history of malignant neoplasm of breast: Secondary | ICD-10-CM | POA: Diagnosis not present

## 2023-02-02 DIAGNOSIS — I700 Atherosclerosis of aorta: Secondary | ICD-10-CM | POA: Diagnosis not present

## 2023-02-02 DIAGNOSIS — R7303 Prediabetes: Secondary | ICD-10-CM | POA: Diagnosis not present

## 2023-02-02 DIAGNOSIS — D62 Acute posthemorrhagic anemia: Secondary | ICD-10-CM | POA: Diagnosis not present

## 2023-02-02 DIAGNOSIS — E785 Hyperlipidemia, unspecified: Secondary | ICD-10-CM | POA: Diagnosis not present

## 2023-02-02 DIAGNOSIS — I083 Combined rheumatic disorders of mitral, aortic and tricuspid valves: Secondary | ICD-10-CM | POA: Diagnosis not present

## 2023-02-02 DIAGNOSIS — J90 Pleural effusion, not elsewhere classified: Secondary | ICD-10-CM | POA: Diagnosis not present

## 2023-02-02 DIAGNOSIS — Z79811 Long term (current) use of aromatase inhibitors: Secondary | ICD-10-CM | POA: Diagnosis not present

## 2023-02-02 DIAGNOSIS — K449 Diaphragmatic hernia without obstruction or gangrene: Secondary | ICD-10-CM | POA: Diagnosis not present

## 2023-02-02 DIAGNOSIS — Z7982 Long term (current) use of aspirin: Secondary | ICD-10-CM | POA: Diagnosis not present

## 2023-02-02 DIAGNOSIS — M1990 Unspecified osteoarthritis, unspecified site: Secondary | ICD-10-CM | POA: Diagnosis not present

## 2023-02-02 DIAGNOSIS — H9190 Unspecified hearing loss, unspecified ear: Secondary | ICD-10-CM | POA: Diagnosis not present

## 2023-02-02 DIAGNOSIS — I119 Hypertensive heart disease without heart failure: Secondary | ICD-10-CM | POA: Diagnosis not present

## 2023-02-03 NOTE — Telephone Encounter (Signed)
Ok to continue pt  

## 2023-02-05 ENCOUNTER — Other Ambulatory Visit: Payer: Self-pay | Admitting: Internal Medicine

## 2023-02-05 DIAGNOSIS — M75100 Unspecified rotator cuff tear or rupture of unspecified shoulder, not specified as traumatic: Secondary | ICD-10-CM | POA: Diagnosis not present

## 2023-02-05 DIAGNOSIS — K449 Diaphragmatic hernia without obstruction or gangrene: Secondary | ICD-10-CM | POA: Diagnosis not present

## 2023-02-05 DIAGNOSIS — F419 Anxiety disorder, unspecified: Secondary | ICD-10-CM | POA: Diagnosis not present

## 2023-02-05 DIAGNOSIS — M1990 Unspecified osteoarthritis, unspecified site: Secondary | ICD-10-CM | POA: Diagnosis not present

## 2023-02-05 DIAGNOSIS — E785 Hyperlipidemia, unspecified: Secondary | ICD-10-CM | POA: Diagnosis not present

## 2023-02-05 DIAGNOSIS — Z7982 Long term (current) use of aspirin: Secondary | ICD-10-CM | POA: Diagnosis not present

## 2023-02-05 DIAGNOSIS — I119 Hypertensive heart disease without heart failure: Secondary | ICD-10-CM | POA: Diagnosis not present

## 2023-02-05 DIAGNOSIS — D62 Acute posthemorrhagic anemia: Secondary | ICD-10-CM | POA: Diagnosis not present

## 2023-02-05 DIAGNOSIS — Z79811 Long term (current) use of aromatase inhibitors: Secondary | ICD-10-CM | POA: Diagnosis not present

## 2023-02-05 DIAGNOSIS — R7303 Prediabetes: Secondary | ICD-10-CM | POA: Diagnosis not present

## 2023-02-05 DIAGNOSIS — S72141D Displaced intertrochanteric fracture of right femur, subsequent encounter for closed fracture with routine healing: Secondary | ICD-10-CM | POA: Diagnosis not present

## 2023-02-05 DIAGNOSIS — Z853 Personal history of malignant neoplasm of breast: Secondary | ICD-10-CM | POA: Diagnosis not present

## 2023-02-05 DIAGNOSIS — I700 Atherosclerosis of aorta: Secondary | ICD-10-CM | POA: Diagnosis not present

## 2023-02-05 DIAGNOSIS — H9190 Unspecified hearing loss, unspecified ear: Secondary | ICD-10-CM | POA: Diagnosis not present

## 2023-02-05 DIAGNOSIS — I083 Combined rheumatic disorders of mitral, aortic and tricuspid valves: Secondary | ICD-10-CM | POA: Diagnosis not present

## 2023-02-05 DIAGNOSIS — J90 Pleural effusion, not elsewhere classified: Secondary | ICD-10-CM | POA: Diagnosis not present

## 2023-02-08 DIAGNOSIS — M1990 Unspecified osteoarthritis, unspecified site: Secondary | ICD-10-CM | POA: Diagnosis not present

## 2023-02-08 DIAGNOSIS — F419 Anxiety disorder, unspecified: Secondary | ICD-10-CM | POA: Diagnosis not present

## 2023-02-08 DIAGNOSIS — J90 Pleural effusion, not elsewhere classified: Secondary | ICD-10-CM | POA: Diagnosis not present

## 2023-02-08 DIAGNOSIS — R7303 Prediabetes: Secondary | ICD-10-CM | POA: Diagnosis not present

## 2023-02-08 DIAGNOSIS — I700 Atherosclerosis of aorta: Secondary | ICD-10-CM | POA: Diagnosis not present

## 2023-02-08 DIAGNOSIS — K449 Diaphragmatic hernia without obstruction or gangrene: Secondary | ICD-10-CM | POA: Diagnosis not present

## 2023-02-08 DIAGNOSIS — E785 Hyperlipidemia, unspecified: Secondary | ICD-10-CM | POA: Diagnosis not present

## 2023-02-08 DIAGNOSIS — Z7982 Long term (current) use of aspirin: Secondary | ICD-10-CM | POA: Diagnosis not present

## 2023-02-08 DIAGNOSIS — I119 Hypertensive heart disease without heart failure: Secondary | ICD-10-CM | POA: Diagnosis not present

## 2023-02-08 DIAGNOSIS — S72141D Displaced intertrochanteric fracture of right femur, subsequent encounter for closed fracture with routine healing: Secondary | ICD-10-CM | POA: Diagnosis not present

## 2023-02-08 DIAGNOSIS — Z853 Personal history of malignant neoplasm of breast: Secondary | ICD-10-CM | POA: Diagnosis not present

## 2023-02-08 DIAGNOSIS — I083 Combined rheumatic disorders of mitral, aortic and tricuspid valves: Secondary | ICD-10-CM | POA: Diagnosis not present

## 2023-02-08 DIAGNOSIS — H9190 Unspecified hearing loss, unspecified ear: Secondary | ICD-10-CM | POA: Diagnosis not present

## 2023-02-08 DIAGNOSIS — M75100 Unspecified rotator cuff tear or rupture of unspecified shoulder, not specified as traumatic: Secondary | ICD-10-CM | POA: Diagnosis not present

## 2023-02-08 DIAGNOSIS — D62 Acute posthemorrhagic anemia: Secondary | ICD-10-CM | POA: Diagnosis not present

## 2023-02-08 DIAGNOSIS — Z79811 Long term (current) use of aromatase inhibitors: Secondary | ICD-10-CM | POA: Diagnosis not present

## 2023-02-08 NOTE — Telephone Encounter (Signed)
VO given to Onalee Hua.

## 2023-02-08 NOTE — Telephone Encounter (Signed)
Ask patient if she is still taking this md after discharge and what is her BP readings.  Records  say she was to stop this . [;ease advise.

## 2023-02-09 DIAGNOSIS — S72141D Displaced intertrochanteric fracture of right femur, subsequent encounter for closed fracture with routine healing: Secondary | ICD-10-CM | POA: Diagnosis not present

## 2023-02-09 NOTE — Telephone Encounter (Signed)
Patient's son called back. He is unsure if patient needs medication as he just filled her pill box this past weekend. He asked for me to call patient this afternoon, she is currently at a doctors appt. Will call her home number later this afternoon.

## 2023-02-09 NOTE — Telephone Encounter (Signed)
Left voicemail for patient to call the office back.   

## 2023-02-09 NOTE — Telephone Encounter (Signed)
I spoke with patient and her caregiver, she is still taking her blood pressure medicine and her blood pressure has been running good. She is currently recovering from a broken hip.

## 2023-02-10 DIAGNOSIS — H9190 Unspecified hearing loss, unspecified ear: Secondary | ICD-10-CM | POA: Diagnosis not present

## 2023-02-10 DIAGNOSIS — M75100 Unspecified rotator cuff tear or rupture of unspecified shoulder, not specified as traumatic: Secondary | ICD-10-CM | POA: Diagnosis not present

## 2023-02-10 DIAGNOSIS — F419 Anxiety disorder, unspecified: Secondary | ICD-10-CM | POA: Diagnosis not present

## 2023-02-10 DIAGNOSIS — I700 Atherosclerosis of aorta: Secondary | ICD-10-CM | POA: Diagnosis not present

## 2023-02-10 DIAGNOSIS — Z853 Personal history of malignant neoplasm of breast: Secondary | ICD-10-CM | POA: Diagnosis not present

## 2023-02-10 DIAGNOSIS — J90 Pleural effusion, not elsewhere classified: Secondary | ICD-10-CM | POA: Diagnosis not present

## 2023-02-10 DIAGNOSIS — M1990 Unspecified osteoarthritis, unspecified site: Secondary | ICD-10-CM | POA: Diagnosis not present

## 2023-02-10 DIAGNOSIS — I119 Hypertensive heart disease without heart failure: Secondary | ICD-10-CM | POA: Diagnosis not present

## 2023-02-10 DIAGNOSIS — D62 Acute posthemorrhagic anemia: Secondary | ICD-10-CM | POA: Diagnosis not present

## 2023-02-10 DIAGNOSIS — S72141D Displaced intertrochanteric fracture of right femur, subsequent encounter for closed fracture with routine healing: Secondary | ICD-10-CM | POA: Diagnosis not present

## 2023-02-10 DIAGNOSIS — R7303 Prediabetes: Secondary | ICD-10-CM | POA: Diagnosis not present

## 2023-02-10 DIAGNOSIS — E785 Hyperlipidemia, unspecified: Secondary | ICD-10-CM | POA: Diagnosis not present

## 2023-02-10 DIAGNOSIS — K449 Diaphragmatic hernia without obstruction or gangrene: Secondary | ICD-10-CM | POA: Diagnosis not present

## 2023-02-10 DIAGNOSIS — I083 Combined rheumatic disorders of mitral, aortic and tricuspid valves: Secondary | ICD-10-CM | POA: Diagnosis not present

## 2023-02-10 DIAGNOSIS — Z79811 Long term (current) use of aromatase inhibitors: Secondary | ICD-10-CM | POA: Diagnosis not present

## 2023-02-10 DIAGNOSIS — Z7982 Long term (current) use of aspirin: Secondary | ICD-10-CM | POA: Diagnosis not present

## 2023-02-11 DIAGNOSIS — R7303 Prediabetes: Secondary | ICD-10-CM | POA: Diagnosis not present

## 2023-02-11 DIAGNOSIS — Z79811 Long term (current) use of aromatase inhibitors: Secondary | ICD-10-CM | POA: Diagnosis not present

## 2023-02-11 DIAGNOSIS — J90 Pleural effusion, not elsewhere classified: Secondary | ICD-10-CM | POA: Diagnosis not present

## 2023-02-11 DIAGNOSIS — I700 Atherosclerosis of aorta: Secondary | ICD-10-CM | POA: Diagnosis not present

## 2023-02-11 DIAGNOSIS — K449 Diaphragmatic hernia without obstruction or gangrene: Secondary | ICD-10-CM | POA: Diagnosis not present

## 2023-02-11 DIAGNOSIS — S72141D Displaced intertrochanteric fracture of right femur, subsequent encounter for closed fracture with routine healing: Secondary | ICD-10-CM | POA: Diagnosis not present

## 2023-02-11 DIAGNOSIS — F419 Anxiety disorder, unspecified: Secondary | ICD-10-CM | POA: Diagnosis not present

## 2023-02-11 DIAGNOSIS — Z853 Personal history of malignant neoplasm of breast: Secondary | ICD-10-CM | POA: Diagnosis not present

## 2023-02-11 DIAGNOSIS — H9190 Unspecified hearing loss, unspecified ear: Secondary | ICD-10-CM | POA: Diagnosis not present

## 2023-02-11 DIAGNOSIS — E785 Hyperlipidemia, unspecified: Secondary | ICD-10-CM | POA: Diagnosis not present

## 2023-02-11 DIAGNOSIS — D62 Acute posthemorrhagic anemia: Secondary | ICD-10-CM | POA: Diagnosis not present

## 2023-02-11 DIAGNOSIS — I083 Combined rheumatic disorders of mitral, aortic and tricuspid valves: Secondary | ICD-10-CM | POA: Diagnosis not present

## 2023-02-11 DIAGNOSIS — Z7982 Long term (current) use of aspirin: Secondary | ICD-10-CM | POA: Diagnosis not present

## 2023-02-11 DIAGNOSIS — M75100 Unspecified rotator cuff tear or rupture of unspecified shoulder, not specified as traumatic: Secondary | ICD-10-CM | POA: Diagnosis not present

## 2023-02-11 DIAGNOSIS — I119 Hypertensive heart disease without heart failure: Secondary | ICD-10-CM | POA: Diagnosis not present

## 2023-02-11 DIAGNOSIS — M1990 Unspecified osteoarthritis, unspecified site: Secondary | ICD-10-CM | POA: Diagnosis not present

## 2023-02-12 DIAGNOSIS — Z7982 Long term (current) use of aspirin: Secondary | ICD-10-CM | POA: Diagnosis not present

## 2023-02-12 DIAGNOSIS — M75100 Unspecified rotator cuff tear or rupture of unspecified shoulder, not specified as traumatic: Secondary | ICD-10-CM | POA: Diagnosis not present

## 2023-02-12 DIAGNOSIS — S72141D Displaced intertrochanteric fracture of right femur, subsequent encounter for closed fracture with routine healing: Secondary | ICD-10-CM | POA: Diagnosis not present

## 2023-02-12 DIAGNOSIS — K449 Diaphragmatic hernia without obstruction or gangrene: Secondary | ICD-10-CM | POA: Diagnosis not present

## 2023-02-12 DIAGNOSIS — D62 Acute posthemorrhagic anemia: Secondary | ICD-10-CM | POA: Diagnosis not present

## 2023-02-12 DIAGNOSIS — Z853 Personal history of malignant neoplasm of breast: Secondary | ICD-10-CM | POA: Diagnosis not present

## 2023-02-12 DIAGNOSIS — Z79811 Long term (current) use of aromatase inhibitors: Secondary | ICD-10-CM | POA: Diagnosis not present

## 2023-02-12 DIAGNOSIS — F419 Anxiety disorder, unspecified: Secondary | ICD-10-CM | POA: Diagnosis not present

## 2023-02-12 DIAGNOSIS — H9190 Unspecified hearing loss, unspecified ear: Secondary | ICD-10-CM | POA: Diagnosis not present

## 2023-02-12 DIAGNOSIS — E785 Hyperlipidemia, unspecified: Secondary | ICD-10-CM | POA: Diagnosis not present

## 2023-02-12 DIAGNOSIS — R7303 Prediabetes: Secondary | ICD-10-CM | POA: Diagnosis not present

## 2023-02-12 DIAGNOSIS — M1990 Unspecified osteoarthritis, unspecified site: Secondary | ICD-10-CM | POA: Diagnosis not present

## 2023-02-12 DIAGNOSIS — I700 Atherosclerosis of aorta: Secondary | ICD-10-CM | POA: Diagnosis not present

## 2023-02-12 DIAGNOSIS — I119 Hypertensive heart disease without heart failure: Secondary | ICD-10-CM | POA: Diagnosis not present

## 2023-02-12 DIAGNOSIS — I083 Combined rheumatic disorders of mitral, aortic and tricuspid valves: Secondary | ICD-10-CM | POA: Diagnosis not present

## 2023-02-12 DIAGNOSIS — J90 Pleural effusion, not elsewhere classified: Secondary | ICD-10-CM | POA: Diagnosis not present

## 2023-02-16 DIAGNOSIS — M1990 Unspecified osteoarthritis, unspecified site: Secondary | ICD-10-CM | POA: Diagnosis not present

## 2023-02-16 DIAGNOSIS — D62 Acute posthemorrhagic anemia: Secondary | ICD-10-CM | POA: Diagnosis not present

## 2023-02-16 DIAGNOSIS — I119 Hypertensive heart disease without heart failure: Secondary | ICD-10-CM | POA: Diagnosis not present

## 2023-02-16 DIAGNOSIS — M75100 Unspecified rotator cuff tear or rupture of unspecified shoulder, not specified as traumatic: Secondary | ICD-10-CM | POA: Diagnosis not present

## 2023-02-16 DIAGNOSIS — Z7982 Long term (current) use of aspirin: Secondary | ICD-10-CM | POA: Diagnosis not present

## 2023-02-16 DIAGNOSIS — Z79811 Long term (current) use of aromatase inhibitors: Secondary | ICD-10-CM | POA: Diagnosis not present

## 2023-02-16 DIAGNOSIS — R7303 Prediabetes: Secondary | ICD-10-CM | POA: Diagnosis not present

## 2023-02-16 DIAGNOSIS — E785 Hyperlipidemia, unspecified: Secondary | ICD-10-CM | POA: Diagnosis not present

## 2023-02-16 DIAGNOSIS — H9190 Unspecified hearing loss, unspecified ear: Secondary | ICD-10-CM | POA: Diagnosis not present

## 2023-02-16 DIAGNOSIS — J90 Pleural effusion, not elsewhere classified: Secondary | ICD-10-CM | POA: Diagnosis not present

## 2023-02-16 DIAGNOSIS — K449 Diaphragmatic hernia without obstruction or gangrene: Secondary | ICD-10-CM | POA: Diagnosis not present

## 2023-02-16 DIAGNOSIS — I083 Combined rheumatic disorders of mitral, aortic and tricuspid valves: Secondary | ICD-10-CM | POA: Diagnosis not present

## 2023-02-16 DIAGNOSIS — F419 Anxiety disorder, unspecified: Secondary | ICD-10-CM | POA: Diagnosis not present

## 2023-02-16 DIAGNOSIS — I700 Atherosclerosis of aorta: Secondary | ICD-10-CM | POA: Diagnosis not present

## 2023-02-16 DIAGNOSIS — S72141D Displaced intertrochanteric fracture of right femur, subsequent encounter for closed fracture with routine healing: Secondary | ICD-10-CM | POA: Diagnosis not present

## 2023-02-16 DIAGNOSIS — Z853 Personal history of malignant neoplasm of breast: Secondary | ICD-10-CM | POA: Diagnosis not present

## 2023-02-17 ENCOUNTER — Telehealth: Payer: Self-pay | Admitting: Hematology and Oncology

## 2023-02-17 DIAGNOSIS — H9190 Unspecified hearing loss, unspecified ear: Secondary | ICD-10-CM | POA: Diagnosis not present

## 2023-02-17 DIAGNOSIS — Z7982 Long term (current) use of aspirin: Secondary | ICD-10-CM | POA: Diagnosis not present

## 2023-02-17 DIAGNOSIS — K449 Diaphragmatic hernia without obstruction or gangrene: Secondary | ICD-10-CM | POA: Diagnosis not present

## 2023-02-17 DIAGNOSIS — M1990 Unspecified osteoarthritis, unspecified site: Secondary | ICD-10-CM | POA: Diagnosis not present

## 2023-02-17 DIAGNOSIS — S72141D Displaced intertrochanteric fracture of right femur, subsequent encounter for closed fracture with routine healing: Secondary | ICD-10-CM | POA: Diagnosis not present

## 2023-02-17 DIAGNOSIS — Z79811 Long term (current) use of aromatase inhibitors: Secondary | ICD-10-CM | POA: Diagnosis not present

## 2023-02-17 DIAGNOSIS — M75100 Unspecified rotator cuff tear or rupture of unspecified shoulder, not specified as traumatic: Secondary | ICD-10-CM | POA: Diagnosis not present

## 2023-02-17 DIAGNOSIS — I700 Atherosclerosis of aorta: Secondary | ICD-10-CM | POA: Diagnosis not present

## 2023-02-17 DIAGNOSIS — F419 Anxiety disorder, unspecified: Secondary | ICD-10-CM | POA: Diagnosis not present

## 2023-02-17 DIAGNOSIS — I119 Hypertensive heart disease without heart failure: Secondary | ICD-10-CM | POA: Diagnosis not present

## 2023-02-17 DIAGNOSIS — R7303 Prediabetes: Secondary | ICD-10-CM | POA: Diagnosis not present

## 2023-02-17 DIAGNOSIS — J90 Pleural effusion, not elsewhere classified: Secondary | ICD-10-CM | POA: Diagnosis not present

## 2023-02-17 DIAGNOSIS — D62 Acute posthemorrhagic anemia: Secondary | ICD-10-CM | POA: Diagnosis not present

## 2023-02-17 DIAGNOSIS — E785 Hyperlipidemia, unspecified: Secondary | ICD-10-CM | POA: Diagnosis not present

## 2023-02-17 DIAGNOSIS — I083 Combined rheumatic disorders of mitral, aortic and tricuspid valves: Secondary | ICD-10-CM | POA: Diagnosis not present

## 2023-02-17 DIAGNOSIS — Z853 Personal history of malignant neoplasm of breast: Secondary | ICD-10-CM | POA: Diagnosis not present

## 2023-02-17 NOTE — Telephone Encounter (Signed)
Scheduled appointment per patients request. Patient is aware of the changes made to her upcoming appointment.

## 2023-02-18 ENCOUNTER — Telehealth: Payer: Self-pay | Admitting: Internal Medicine

## 2023-02-18 DIAGNOSIS — S72141D Displaced intertrochanteric fracture of right femur, subsequent encounter for closed fracture with routine healing: Secondary | ICD-10-CM | POA: Diagnosis not present

## 2023-02-18 DIAGNOSIS — Z7982 Long term (current) use of aspirin: Secondary | ICD-10-CM | POA: Diagnosis not present

## 2023-02-18 DIAGNOSIS — F419 Anxiety disorder, unspecified: Secondary | ICD-10-CM | POA: Diagnosis not present

## 2023-02-18 DIAGNOSIS — I700 Atherosclerosis of aorta: Secondary | ICD-10-CM | POA: Diagnosis not present

## 2023-02-18 DIAGNOSIS — M75100 Unspecified rotator cuff tear or rupture of unspecified shoulder, not specified as traumatic: Secondary | ICD-10-CM | POA: Diagnosis not present

## 2023-02-18 DIAGNOSIS — J90 Pleural effusion, not elsewhere classified: Secondary | ICD-10-CM | POA: Diagnosis not present

## 2023-02-18 DIAGNOSIS — I083 Combined rheumatic disorders of mitral, aortic and tricuspid valves: Secondary | ICD-10-CM | POA: Diagnosis not present

## 2023-02-18 DIAGNOSIS — R7303 Prediabetes: Secondary | ICD-10-CM | POA: Diagnosis not present

## 2023-02-18 DIAGNOSIS — H9190 Unspecified hearing loss, unspecified ear: Secondary | ICD-10-CM | POA: Diagnosis not present

## 2023-02-18 DIAGNOSIS — Z853 Personal history of malignant neoplasm of breast: Secondary | ICD-10-CM | POA: Diagnosis not present

## 2023-02-18 DIAGNOSIS — I119 Hypertensive heart disease without heart failure: Secondary | ICD-10-CM | POA: Diagnosis not present

## 2023-02-18 DIAGNOSIS — E785 Hyperlipidemia, unspecified: Secondary | ICD-10-CM | POA: Diagnosis not present

## 2023-02-18 DIAGNOSIS — M1990 Unspecified osteoarthritis, unspecified site: Secondary | ICD-10-CM | POA: Diagnosis not present

## 2023-02-18 DIAGNOSIS — K449 Diaphragmatic hernia without obstruction or gangrene: Secondary | ICD-10-CM | POA: Diagnosis not present

## 2023-02-18 DIAGNOSIS — Z79811 Long term (current) use of aromatase inhibitors: Secondary | ICD-10-CM | POA: Diagnosis not present

## 2023-02-18 DIAGNOSIS — D62 Acute posthemorrhagic anemia: Secondary | ICD-10-CM | POA: Diagnosis not present

## 2023-02-18 NOTE — Telephone Encounter (Signed)
Reporting patient feels fine but heart rate is irregular, low at 48 bpm when checked over a 60 second interval.

## 2023-02-18 NOTE — Telephone Encounter (Signed)
Attempted to reach pt. Left a voicemail to contact us back.  

## 2023-02-18 NOTE — Telephone Encounter (Signed)
Needs visit to assess 

## 2023-02-23 ENCOUNTER — Ambulatory Visit: Payer: Medicare Other | Admitting: Hematology and Oncology

## 2023-02-24 NOTE — Telephone Encounter (Signed)
Attempted to reach son, if he is able to help pt with virtual visit for med check. Left a voicemail to call us back.

## 2023-02-24 NOTE — Telephone Encounter (Signed)
Spoke to pt. Pt states she doesn't want to take so many medications and wants to know what medication she should continue to take or discontinue.   Advise pt it is best to have office visit or virtual visit so she can talk to provider. Pt states she can't see provider sooner than 03/09/2023 due to transportation nor video visit due to no internet.   Pt went through her medication lists and ask if she should continue taking Ziprasione 20mg ? Still have a few left and doesn't think she need it any more. Inform pt that she doesn't have to take it if she doesn't need as medication is written "as need"  Aspirin 81mg . She states she is currently taking it twice  a day.   Not taking hydrocodone-ace. As not needing it currently.   For metoprolol 25mg . Pt is taking 1/2 tablet and ask if she should continue to take it. Brought up irregular heartbeat. States she almost ran out. Advise pt she still has a refill left.   Pt has upcoming with provider on 03/09/2023.   Please advise.

## 2023-02-24 NOTE — Telephone Encounter (Signed)
Pt called to ask MD which medications can she stop taking? Pt states she was in the hospital with a broken hip and she is taking so many medications,  she is not sure what medications she needs to continue taking, and which ones she can stop.  Pt is asking for a call back to discuss.

## 2023-02-25 NOTE — Telephone Encounter (Signed)
Note written advise  agree  at this time.  From what is  ordered .  Ok to keep August appt ( only 2 weeks from now) if stable vital signs and no new problems

## 2023-02-26 DIAGNOSIS — Z87440 Personal history of urinary (tract) infections: Secondary | ICD-10-CM | POA: Diagnosis not present

## 2023-02-26 DIAGNOSIS — Z79811 Long term (current) use of aromatase inhibitors: Secondary | ICD-10-CM | POA: Diagnosis not present

## 2023-02-26 DIAGNOSIS — M75100 Unspecified rotator cuff tear or rupture of unspecified shoulder, not specified as traumatic: Secondary | ICD-10-CM | POA: Diagnosis not present

## 2023-02-26 DIAGNOSIS — S72141D Displaced intertrochanteric fracture of right femur, subsequent encounter for closed fracture with routine healing: Secondary | ICD-10-CM | POA: Diagnosis not present

## 2023-02-26 DIAGNOSIS — Z9012 Acquired absence of left breast and nipple: Secondary | ICD-10-CM | POA: Diagnosis not present

## 2023-02-26 DIAGNOSIS — I700 Atherosclerosis of aorta: Secondary | ICD-10-CM | POA: Diagnosis not present

## 2023-02-26 DIAGNOSIS — Z7982 Long term (current) use of aspirin: Secondary | ICD-10-CM | POA: Diagnosis not present

## 2023-02-26 DIAGNOSIS — I083 Combined rheumatic disorders of mitral, aortic and tricuspid valves: Secondary | ICD-10-CM | POA: Diagnosis not present

## 2023-02-26 DIAGNOSIS — K449 Diaphragmatic hernia without obstruction or gangrene: Secondary | ICD-10-CM | POA: Diagnosis not present

## 2023-02-26 DIAGNOSIS — R7303 Prediabetes: Secondary | ICD-10-CM | POA: Diagnosis not present

## 2023-02-26 DIAGNOSIS — Z9181 History of falling: Secondary | ICD-10-CM | POA: Diagnosis not present

## 2023-02-26 DIAGNOSIS — E785 Hyperlipidemia, unspecified: Secondary | ICD-10-CM | POA: Diagnosis not present

## 2023-02-26 DIAGNOSIS — Z853 Personal history of malignant neoplasm of breast: Secondary | ICD-10-CM | POA: Diagnosis not present

## 2023-02-26 DIAGNOSIS — M1990 Unspecified osteoarthritis, unspecified site: Secondary | ICD-10-CM | POA: Diagnosis not present

## 2023-02-26 DIAGNOSIS — F419 Anxiety disorder, unspecified: Secondary | ICD-10-CM | POA: Diagnosis not present

## 2023-02-26 DIAGNOSIS — I119 Hypertensive heart disease without heart failure: Secondary | ICD-10-CM | POA: Diagnosis not present

## 2023-02-26 DIAGNOSIS — Z86010 Personal history of colonic polyps: Secondary | ICD-10-CM | POA: Diagnosis not present

## 2023-02-26 DIAGNOSIS — J90 Pleural effusion, not elsewhere classified: Secondary | ICD-10-CM | POA: Diagnosis not present

## 2023-02-26 DIAGNOSIS — D62 Acute posthemorrhagic anemia: Secondary | ICD-10-CM | POA: Diagnosis not present

## 2023-02-26 DIAGNOSIS — Z87891 Personal history of nicotine dependence: Secondary | ICD-10-CM | POA: Diagnosis not present

## 2023-02-26 DIAGNOSIS — H9190 Unspecified hearing loss, unspecified ear: Secondary | ICD-10-CM | POA: Diagnosis not present

## 2023-03-03 ENCOUNTER — Telehealth: Payer: Self-pay | Admitting: Internal Medicine

## 2023-03-03 DIAGNOSIS — J90 Pleural effusion, not elsewhere classified: Secondary | ICD-10-CM | POA: Diagnosis not present

## 2023-03-03 DIAGNOSIS — R7303 Prediabetes: Secondary | ICD-10-CM | POA: Diagnosis not present

## 2023-03-03 DIAGNOSIS — Z853 Personal history of malignant neoplasm of breast: Secondary | ICD-10-CM | POA: Diagnosis not present

## 2023-03-03 DIAGNOSIS — Z79811 Long term (current) use of aromatase inhibitors: Secondary | ICD-10-CM | POA: Diagnosis not present

## 2023-03-03 DIAGNOSIS — I083 Combined rheumatic disorders of mitral, aortic and tricuspid valves: Secondary | ICD-10-CM | POA: Diagnosis not present

## 2023-03-03 DIAGNOSIS — M75100 Unspecified rotator cuff tear or rupture of unspecified shoulder, not specified as traumatic: Secondary | ICD-10-CM | POA: Diagnosis not present

## 2023-03-03 DIAGNOSIS — K449 Diaphragmatic hernia without obstruction or gangrene: Secondary | ICD-10-CM | POA: Diagnosis not present

## 2023-03-03 DIAGNOSIS — M1990 Unspecified osteoarthritis, unspecified site: Secondary | ICD-10-CM | POA: Diagnosis not present

## 2023-03-03 DIAGNOSIS — I700 Atherosclerosis of aorta: Secondary | ICD-10-CM | POA: Diagnosis not present

## 2023-03-03 DIAGNOSIS — Z7982 Long term (current) use of aspirin: Secondary | ICD-10-CM | POA: Diagnosis not present

## 2023-03-03 DIAGNOSIS — E785 Hyperlipidemia, unspecified: Secondary | ICD-10-CM | POA: Diagnosis not present

## 2023-03-03 DIAGNOSIS — S72141D Displaced intertrochanteric fracture of right femur, subsequent encounter for closed fracture with routine healing: Secondary | ICD-10-CM | POA: Diagnosis not present

## 2023-03-03 DIAGNOSIS — D62 Acute posthemorrhagic anemia: Secondary | ICD-10-CM | POA: Diagnosis not present

## 2023-03-03 DIAGNOSIS — F419 Anxiety disorder, unspecified: Secondary | ICD-10-CM | POA: Diagnosis not present

## 2023-03-03 DIAGNOSIS — I119 Hypertensive heart disease without heart failure: Secondary | ICD-10-CM | POA: Diagnosis not present

## 2023-03-03 DIAGNOSIS — H9190 Unspecified hearing loss, unspecified ear: Secondary | ICD-10-CM | POA: Diagnosis not present

## 2023-03-03 NOTE — Telephone Encounter (Signed)
Says they faxed over orders for home health and are still awaiting receipt, last sent 02/26/23.

## 2023-03-04 NOTE — Telephone Encounter (Signed)
Spoke to [place] and inform we fax over the document yesterday. She states they receive it and no other action is needed at the moment.

## 2023-03-09 ENCOUNTER — Encounter: Payer: Self-pay | Admitting: Internal Medicine

## 2023-03-09 ENCOUNTER — Ambulatory Visit (INDEPENDENT_AMBULATORY_CARE_PROVIDER_SITE_OTHER): Payer: Medicare Other | Admitting: Internal Medicine

## 2023-03-09 VITALS — BP 114/64 | HR 64 | Temp 98.2°F | Ht 62.0 in | Wt 112.8 lb

## 2023-03-09 DIAGNOSIS — Z8781 Personal history of (healed) traumatic fracture: Secondary | ICD-10-CM

## 2023-03-09 DIAGNOSIS — Z79899 Other long term (current) drug therapy: Secondary | ICD-10-CM

## 2023-03-09 DIAGNOSIS — I10 Essential (primary) hypertension: Secondary | ICD-10-CM | POA: Diagnosis not present

## 2023-03-09 NOTE — Progress Notes (Unsigned)
Chief Complaint  Patient presents with   Medical Management of Chronic Issues    Pt would like to go over med list and to know if should continue taking Ziprasidone.  Requesting handicap sticker.    Follow-up    On irregular heart rate from PT and going over med list.     HPI: [name] [name] 89 y.o. come in for post hospital  fall fx r hip and surgery rehab with comlication of gi constipation and post anesthesia halucinations     and here for  med check  would like to get off some med  fall was related to the recycling bine  now walking pretty well with a  with cane  . Just finished home PT  Problems with  cost and tranportation .  Are a problem  Her husband is difficult to  take care of and she has done lawn transport cleaning    son helped arranging som things.  Geodon Reglan ? How often taking   says  no meds more than 2 x per day  ? If had  irreg heart beat she was not aware    no cp sob sncope at this time  Has fu with oncology soon ROS: See pertinent positives and negatives per HPI. No cuurent cp sob syncope  hearing dec and needs her hearing aids . Feels vision ok but needs her check up.   Past Medical History:  Diagnosis Date   Adenomatous colon polyp     4 2008   Breast cancer (HCC) 1978   left breast   Complication of anesthesia    Cystitis, interstitial    FRACTURE, ANKLE, LEFT 12/14/2006   Qualifier: Diagnosis of  By: Lawernce Ion, CMA (AAMA), Shannon S    FRACTURE, WRIST 12/14/2006   Qualifier: Diagnosis of  By: Lawernce Ion, CMA (AAMA), Bethann Berkshire    Hearing loss    does not use hearing aids   Hyperlipidemia    Hypertension    Osteoarthritis    PONV (postoperative nausea and vomiting)    Episode after Ether   Rotator cuff tear, right    Shingles    Urinary incontinence     Family History  Problem Relation Age of Onset   Suicidality Mother    Heart attack Father    Cancer - Other Daughter        throat cancer    Thyroid disease Neg Hx     Social History    Socioeconomic History   Marital status: Married    Spouse name: Not on file   Number of children: 3   Years of education: Not on file   Highest education level: Not on file  Occupational History    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  Tobacco Use   Smoking status: Former    Current packs/day: 0.25    Average packs/day: 0.3 packs/day for 15.0 years (3.8 ttl pk-yrs)    Types: Cigarettes   Smokeless tobacco: Never   Tobacco comments:    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 not interested in caregiver or respite support   Enjoys reading, walking around the house.   Social Determinants of Health   Financial Resource Strain: Low Risk  (09/23/2021)   Overall Financial Resource Strain (CARDIA)    Difficulty of Paying Living Expenses: Not hard at all  Food Insecurity: No Food Insecurity (01/21/2023)   Hunger Vital Sign    Worried About Running Out of Food in the Last Year: Never true    Ran Out of Food in the Last Year: Never true  Transportation Needs: No Transportation Needs (01/21/2023)   PRAPARE - Administrator, Civil Service (Medical): No    Lack of Transportation (Non-Medical): No  Physical Activity: Inactive (09/23/2021)   Exercise Vital Sign    Days of Exercise per Week: 0 days    Minutes of Exercise per Session: 0 min  Stress: Stress Concern Present (09/23/2021)   [employer]  of Occupational Health - Occupational Stress Questionnaire    Feeling of Stress : To some extent  Social Connections: Unknown (09/19/2019)   Social Connection and Isolation Panel [NHANES]    Frequency of Communication with Friends and Family: More than three times a week    Frequency of Social Gatherings with Friends and Family: Once a week    Attends Religious Services: Not on Marketing executive or Organizations: Not on file    Attends Banker Meetings: Not on file    Marital Status: Married    Outpatient Medications Prior to Visit  Medication Sig Dispense Refill   amLODipine (NORVASC) 5 MG tablet TAKE 1 TABLET(5 MG) BY MOUTH DAILY 90 tablet 1   Ascorbic Acid (VITAMIN C) 500 MG tablet Take 500 mg by mouth daily.     aspirin EC 81 MG tablet Take 1 tablet (81 mg total) by mouth 2 (two) times daily. Swallow whole. 30 tablet 12   Calcium Carb-Cholecalciferol 600-800 MG-UNIT TABS Take 2 tablets by mouth daily.     letrozole (FEMARA) 2.5 MG tablet TAKE 1 TABLET(2.5 MG) BY MOUTH DAILY 90 tablet 3   Multiple Vitamins-Minerals (CENTRUM SILVER PO) Take 1 tablet by mouth daily.     acetaminophen (TYLENOL) 325 MG tablet Take 1-2 tablets (325-650 mg total) by mouth every 6 (six) hours as needed for mild pain (pain score 1-3 or temp > 100.5). (Patient not taking: Reported on 03/09/2023)     loratadine (CLARITIN) 10 MG tablet Take 10 mg by mouth daily as needed for allergies. (Patient not taking: Reported on 03/09/2023)     methocarbamol (ROBAXIN) 500 MG tablet Take 1 tablet (500 mg total) by mouth every 6 (six) hours as needed for muscle spasms. (Patient not taking: Reported on 03/09/2023) 60 tablet 0   metoCLOPramide (REGLAN) 5 MG/5ML solution Take 5 mLs (5 mg total) by mouth 3 (three) times daily before meals. 120 mL 0   metoprolol succinate (TOPROL-XL) 25 MG 24 hr tablet Take 0.5 tablets (12.5 mg total) by mouth daily. (Patient not taking: Reported on 03/09/2023) 30 tablet 2    pantoprazole (PROTONIX) 40 MG tablet Take 1 tablet (40 mg total) by mouth daily. (Patient not  taking: Reported on 03/09/2023) 30 tablet 0   polyethylene glycol (MIRALAX / GLYCOLAX) 17 g packet Take 17 g by mouth daily. (Patient not taking: Reported on 03/09/2023) 14 each 0   simethicone (MYLICON) 40 MG/0.6ML drops Take 1.2 mLs (80 mg total) by mouth 4 (four) times daily as needed for flatulence. (Patient not taking: Reported on 03/09/2023) 30 mL 0   ziprasidone (GEODON) 20 MG capsule Take 1 capsule (20 mg total) by mouth 3 times/day as needed-between meals & bedtime (agitation/anxiety/combative). (Patient not taking: Reported on 03/09/2023) 10 capsule 0   HYDROcodone-acetaminophen (NORCO/VICODIN) 5-325 MG tablet Take 1-2 tablets by mouth every 4 (four) hours as needed for moderate pain (pain score 4-6). (Patient not taking: Reported on 03/09/2023) 30 tablet 0   No facility-administered medications prior to visit.     EXAM:  BP 114/64 (BP Location: Left Arm, Patient Position: Sitting, Cuff Size: Normal)   Pulse 64 Comment: regular rhyrtym  Temp 98.2 F (36.8 C) (Oral)   Ht 5\' 2"  (1.575 m)   Wt 112 lb 12.8 oz (51.2 kg)   SpO2 96%   BMI 20.63 kg/m   Body mass index is 20.63 kg/m.  GENERAL: vitals reviewed and listed above, alert, oriented, appears well hydrated and in no acute distress hard of hearing but cognitively intact  walks steadily with cane  but short steps no tremor  nl speech wears glasses   NECK: no obvious masses on inspection palpation  LUNGS: clear to auscultation bilaterally, no wheezes, rales or rhonchi, good air movement CV: HRRR, no clubbing cyanosis or  peripheral edema nl cap refill  MS: moves all extremities without noticeable focal  abnormality slight limp arises from chair well.  PSYCH: pleasant and cooperative,  Lab Results  Component Value Date   WBC 6.5 01/18/2023   HGB 11.9 (L) 01/18/2023   HCT 37.3 01/18/2023   PLT 218 01/18/2023   GLUCOSE 99 01/18/2023   CHOL  217 (H) 05/20/2022   TRIG 89.0 05/20/2022   HDL 63.60 05/20/2022   LDLDIRECT 165.0 12/07/2006   LDLCALC 135 (H) 05/20/2022   ALT 23 01/18/2023   AST 22 01/18/2023   NA 140 01/18/2023   K 4.0 01/18/2023   CL 103 01/18/2023   CREATININE 0.70 01/18/2023   BUN 17 01/18/2023   CO2 24 01/18/2023   TSH 1.56 05/20/2022   INR 1.1 12/31/2022   HGBA1C 5.6 11/24/2022   BP Readings from Last 3 Encounters:  03/09/23 114/64  01/20/23 (!) 141/69  01/07/23 (!) 120/55    ASSESSMENT AND PLAN:  Discussed the following assessment and plan:  Medication management  Essential hypertension  History of fracture  History of fracture of right hip She is chagrinned about the loss of driving but feels she could driveand wants to get back to  as needed  because of cost and assistance  .  Walking much better with cane assist .  Wean meds Reglan and geodon although hard to tell  how often taking theses meds  Her home pt is finished . And ortho appt not until Nov.  She should ask ortho about driving  short distances .  I am not making that call .  Not sure what to make about the call about irreg rhythm and had no sx and has been monitored in hospital and rehab.  Had echo with nl lv but dilated la nd r ae not sure if other evalutaions done in hospital  Wants to delay next appt in October to  Dec Jan and will do since she has so many other obligations and appts .  Will  see if can  implement zio patch   -Patient advised to return or notify health care team  if  new concerns arise.  Patient Instructions  Good to see you today .  Need to get back with ortho about driving again .   We can wean the geodon ( don't stop suddenly)  I advise  eventually get off the Reglan with time  I dont usually prescribe this medication because of risk of side  effect.  And wean off  this also .   Decrease geodon by once per day and wean over 1-3 weeks   Contact  the orthopedic team  about  opinion about if ok to   driving  before  November .  I think you are doig well. Considering .   It sounds like  you are doing well with mobility .  Consideration of medication for bone health to prevent  fracture .  Your heart rate is regular today .  Will review  record  for other issues undefined.        Neta Mends.  M.D.

## 2023-03-09 NOTE — Patient Instructions (Addendum)
Good to see you today .  Need to get back with ortho about driving again .   We can wean the geodon ( don't stop suddenly)  I advise  eventually get off the Reglan with time  I dont usually prescribe this medication because of risk of side  effect.  And wean off  this also .   Decrease geodon by once per day and wean over 1-3 weeks   Contact  the orthopedic team  about  opinion about if ok to  driving  before  November .  I think you are doig well. Considering .   It sounds like  you are doing well with mobility .  Consideration of medication for bone health to prevent  fracture .  Your heart rate is regular today .  Will review  record  for other issues undefined.

## 2023-03-11 ENCOUNTER — Other Ambulatory Visit: Payer: Self-pay | Admitting: Internal Medicine

## 2023-03-11 DIAGNOSIS — I499 Cardiac arrhythmia, unspecified: Secondary | ICD-10-CM

## 2023-03-12 ENCOUNTER — Ambulatory Visit: Payer: Medicare Other | Attending: Internal Medicine

## 2023-03-12 DIAGNOSIS — I499 Cardiac arrhythmia, unspecified: Secondary | ICD-10-CM

## 2023-03-12 NOTE — Progress Notes (Unsigned)
Enrolled patient for a 14 day Zio XT monitor to be mailed to patients home  DOD to read

## 2023-03-16 ENCOUNTER — Telehealth: Payer: Self-pay

## 2023-03-16 ENCOUNTER — Inpatient Hospital Stay: Payer: Medicare Other | Attending: Hematology and Oncology | Admitting: Hematology and Oncology

## 2023-03-16 NOTE — Telephone Encounter (Signed)
Contacted pt today regarding to the cardiac monitor.   Pt reports she received it yesterday and haven't open the package.   Pt inform she is upset that the transportation person didn't her up for her cancer center appt today.   She states she needed her own car to go to her appt and aware she has to contact her ortho about it.   Pt also mention that she needed her hearing aid. That is more important but struggle to due to transportation issues.   Advise pt to open the box to start the cardiac monitor. Then read the instruction that is provided for her. Pt states she is under stress and doesn't like to read.   Advise pt I would call her back as I'm unfamiliar of how the heart monitor works.

## 2023-03-16 NOTE — Telephone Encounter (Signed)
-----   Message from Berniece Andreas sent at 03/11/2023 10:25 PM EDT ----- Tell patient I ordered a cardiac monitor ( patch on the skin to see if having irregular heart beat )

## 2023-03-16 NOTE — Telephone Encounter (Signed)
Spoke to pt today regarding to heart monitor. See other phone encounter.    Pt ask if provider recommend her to get covid and rsv vaccine. Please advise.

## 2023-03-16 NOTE — Assessment & Plan Note (Deleted)
09/26/2021:Screening mammogram detected right breast distortion indeterminate size, 2 o'clock position biopsy revealed invasive lobular cancer ER 95%, PR 5%, Ki-67 5%, HER2 1+ negative (On the biopsy the size of the invasive cancer was 7 mm)   10/27/2021: Right lumpectomy: Grade 2 ILC 2.3 cm, margins negative, ER 95%, PR 5%, HER2 negative 1+, Ki-67 5%   Treatment plan: Adjuvant antiestrogen therapy with letrozole 2.5 mg daily (not curative option): She did not want to go through another surgery.   Letrozole toxicities: Hair thinning   Breast cancer surveillance: Mammogram 08/07/2022: Benign breast density category D Breast exam 03/16/2023: Benign CT abdomen and pelvis 01/19/2023: Benign   Return to clinic in 1 year for follow-up

## 2023-03-16 NOTE — Telephone Encounter (Signed)
Pt's son says he is returning a call   [phone]

## 2023-03-16 NOTE — Telephone Encounter (Signed)
Pt verbalized understanding and states she will have it done after finish her task for tonight.   She states she might not able to get it on by tonight.

## 2023-03-16 NOTE — Telephone Encounter (Signed)
Spoke to pt. Gave pt [facility] number to contact and also monitor customer service if needed help.

## 2023-03-17 DIAGNOSIS — I499 Cardiac arrhythmia, unspecified: Secondary | ICD-10-CM

## 2023-03-17 NOTE — Telephone Encounter (Signed)
Yes would advise RSV vaccine  and  covid booster ( at least 6 mos from last booster or infection with covid19)

## 2023-03-17 NOTE — Telephone Encounter (Signed)
Attempted pt and update her of message below. Left a voicemail to call us back.

## 2023-03-18 NOTE — Telephone Encounter (Signed)
Spoke to pt and inform her of message below.   Pt c/o not having transportation to her appt, and want to drive on her own. Pt is aware she is suppose to contact her ortho. Pt states she had tried it with no success.   Advise to continue to try it.   Pt states she will try her best to go to her appt.   Pt updates she had her cardiac monitor on yesterday and was told to have it on for 2 wks.

## 2023-03-22 ENCOUNTER — Telehealth: Payer: Self-pay

## 2023-03-22 NOTE — Telephone Encounter (Signed)
Pt called and states she needs a f/u appt with MD. She missed it last week because her ride did not show to bring her to her appt.  Sge states she recently broke her hip and her wrist.  She nor her husband are able to drive.  She asks if appt with MD is necessary and is concerned about her right breast post lumpectomy. She wants to make sure cancer does not return, therefore wants to ensure she is receiving annual MM's.   She has concerns regarding letrozole as it relates to poor bone health.  Advised pt I would send a message to scheduler to contact her to r/s MD f/u.

## 2023-03-23 IMAGING — MG MM BREAST LOCALIZATION CLIP
4 series · 4 of 12 positions shown · non-contrast
Comparison: Previous exam(s).

CLINICAL DATA: Post biopsy mammogram of the right breast for clip
placement.

EXAM:
3D DIAGNOSTIC RIGHT MAMMOGRAM POST ULTRASOUND BIOPSY

[R CC synth-2D]
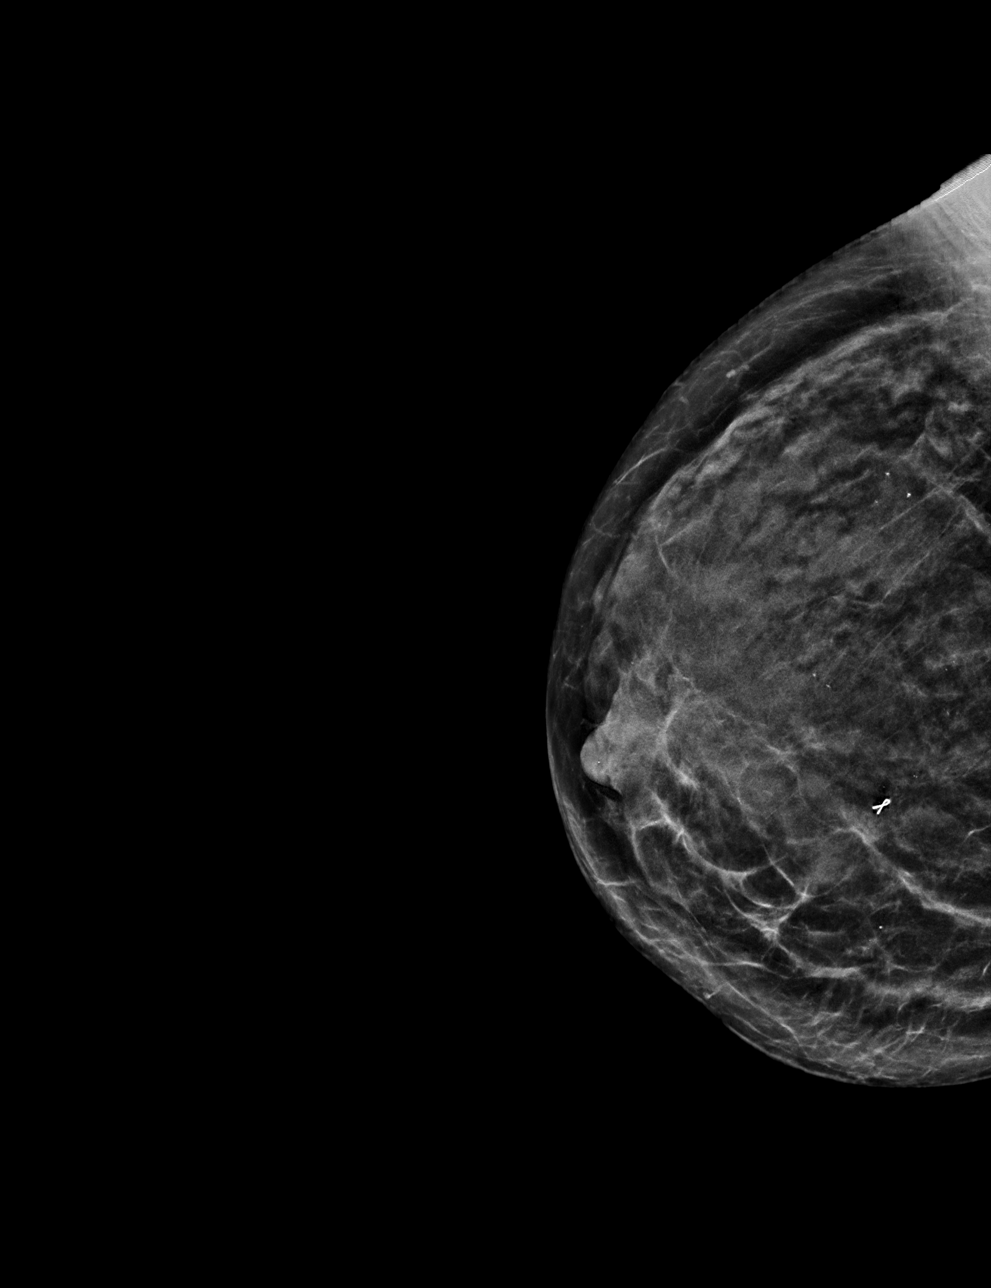

[R ML synth-2D]
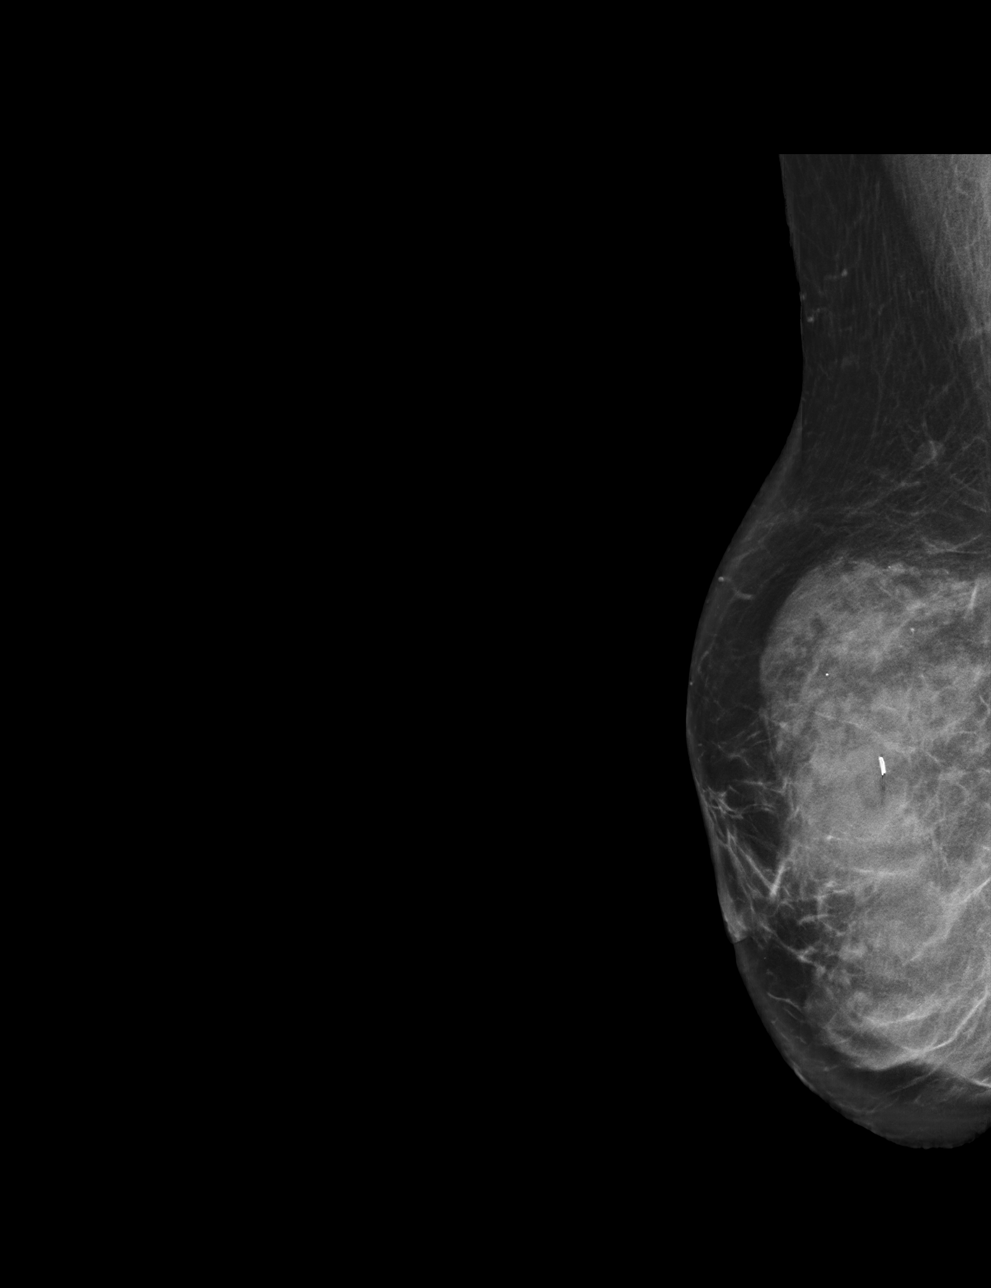

[R ML tomo · tomo slice 32/63.0]
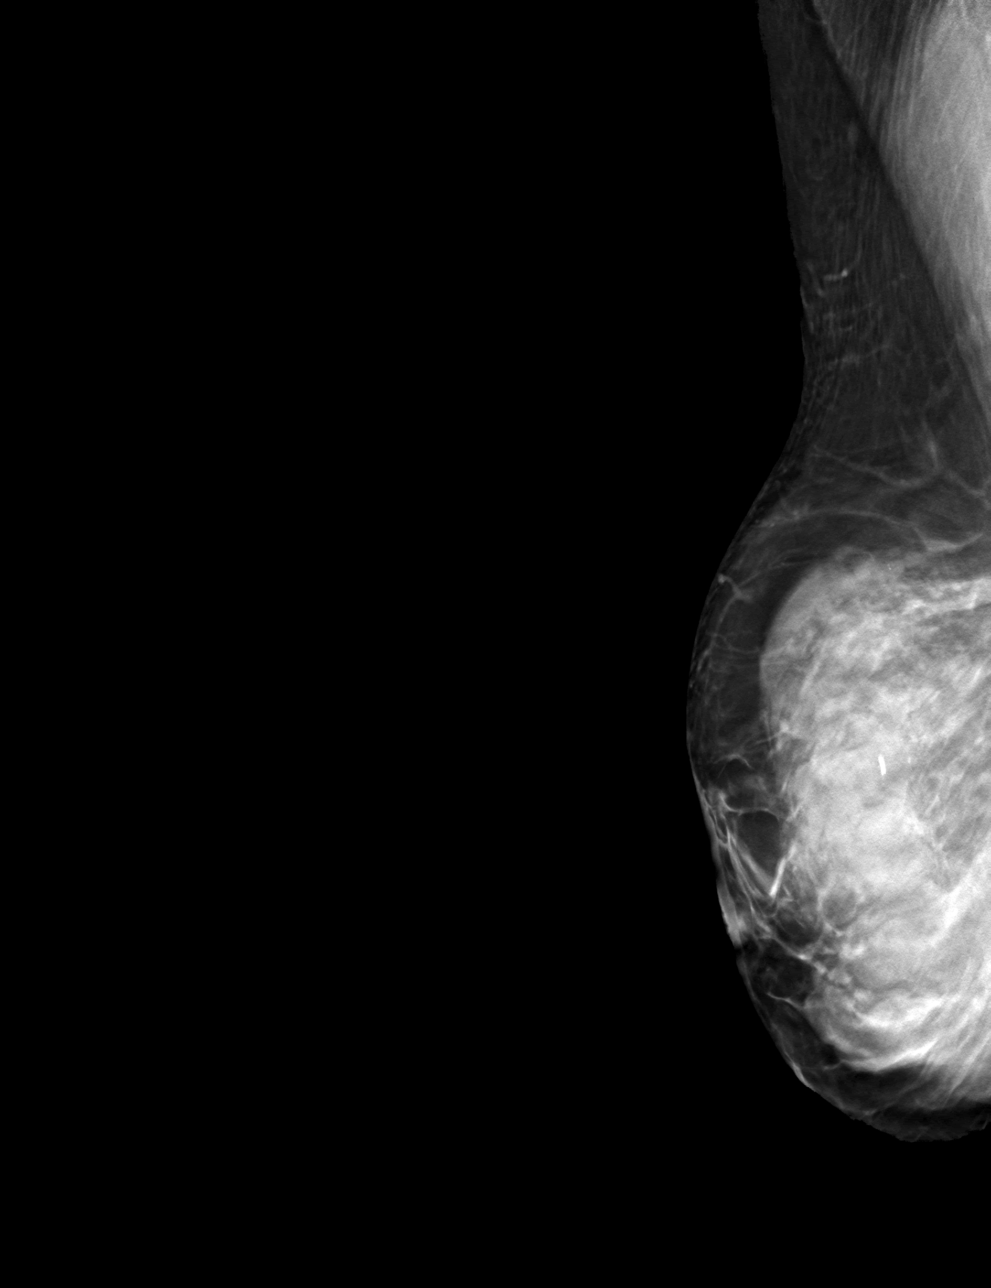

[R CC tomo · tomo slice 23/45.0]
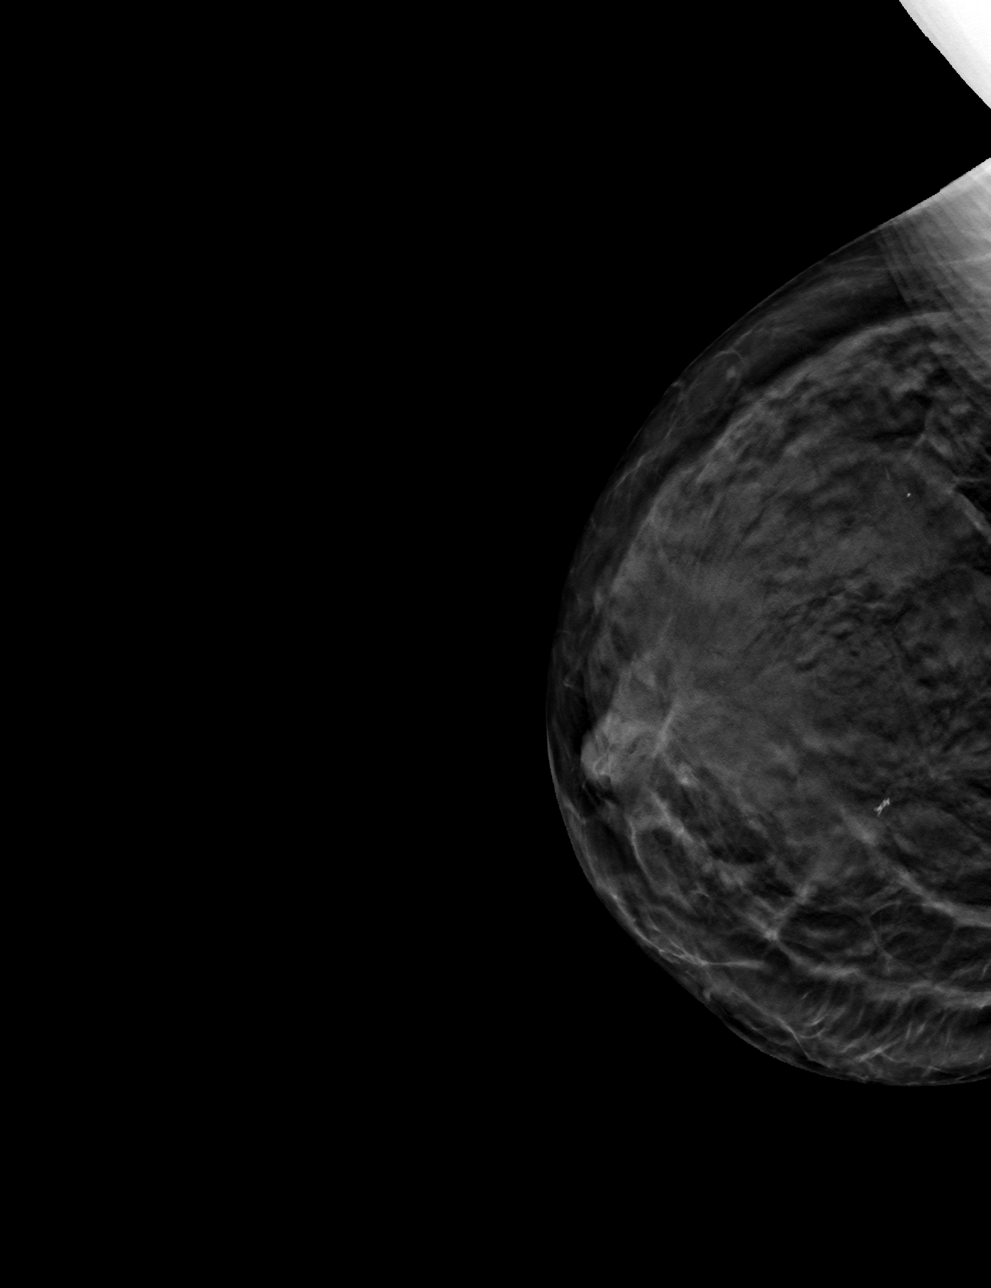

[4 of 12 positions shown; findings below may reference images not displayed]

FINDINGS: 3D Mammographic images were obtained following ultrasound guided
biopsy of a shadowing mass in the upper inner right breast. The
biopsy marking clip is about 7 mm anterior to the center of the area
of distortion.
IMPRESSION: The a ribbon shaped biopsy marking clip is approximately 7 mm
anterior to the center of the distortion in the upper inner right
breast.

Final Assessment: Post Procedure Mammograms for Marker Placement

## 2023-03-23 IMAGING — US US  BREAST BX W/ LOC DEV 1ST LESION IMG BX SPEC US GUIDE*R*
1 series · 12 of 14 positions shown · non-contrast
Comparison: Previous exam(s).
COMPARISON: Previous exam(s).

Addendum:
CLINICAL DATA: 89-year-old female presenting for ultrasound-guided
biopsy of a right breast mass.

EXAM:
ULTRASOUND GUIDED RIGHT BREAST CORE NEEDLE BIOPSY

[Series 1: us breast bx w/ loc dev 1st lesion img bx spec us  · 0.06mm/px · 12 of 14 slices shown]
[im 1/14]
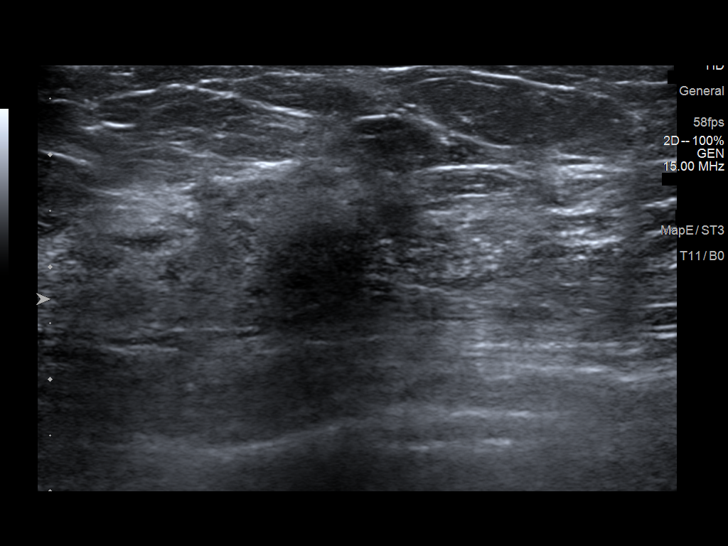
[im 2/14]
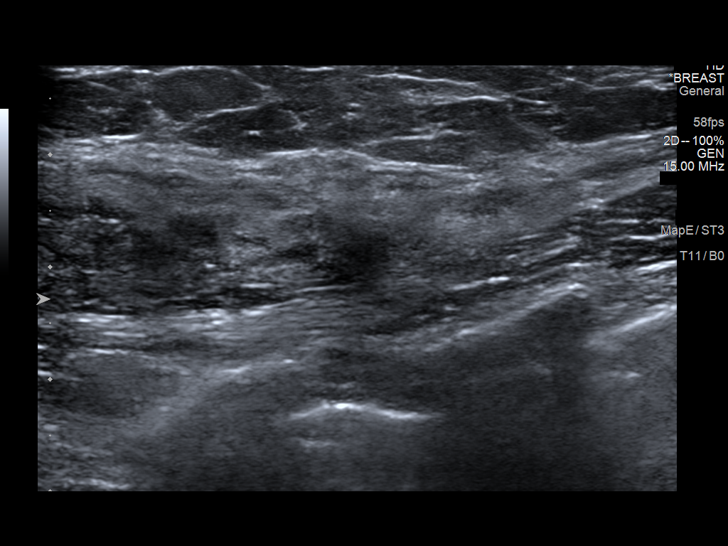
[im 3/14]
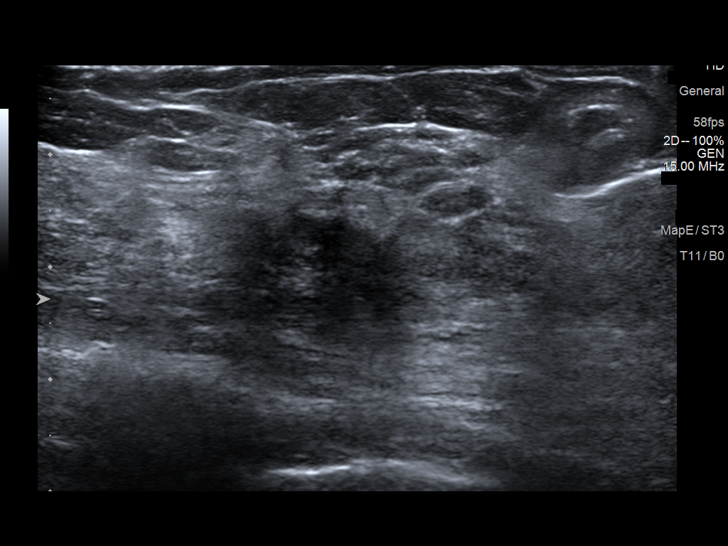
[im 5/14]
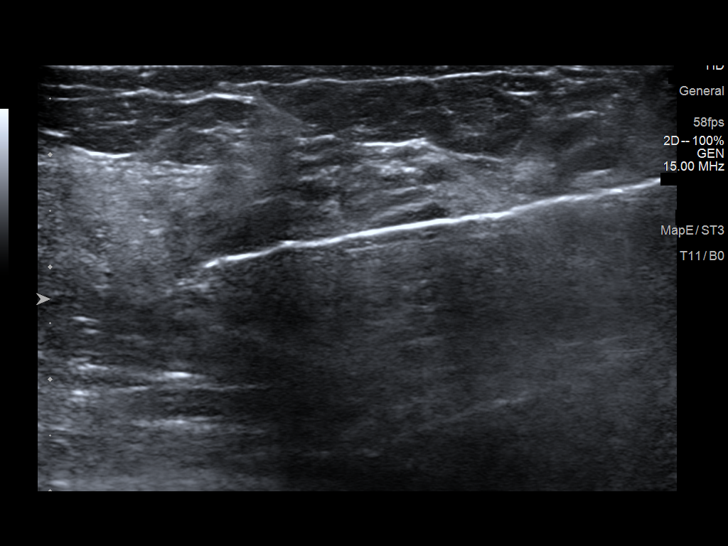
[im 6/14]
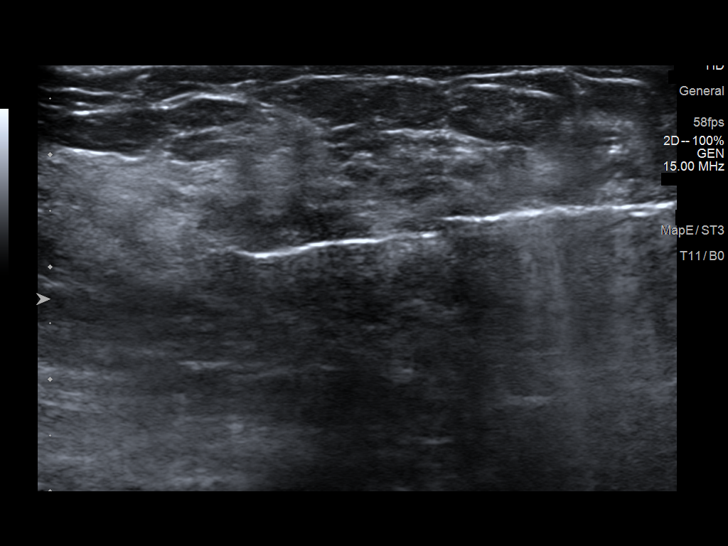
[im 7/14]
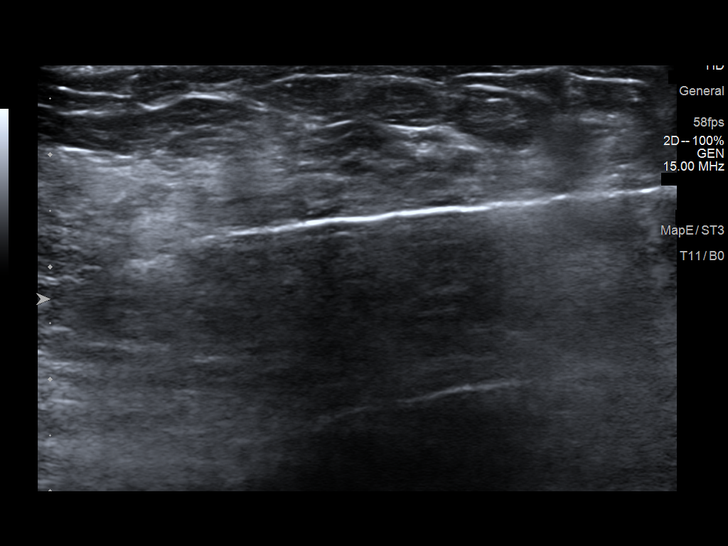
[im 8/14]
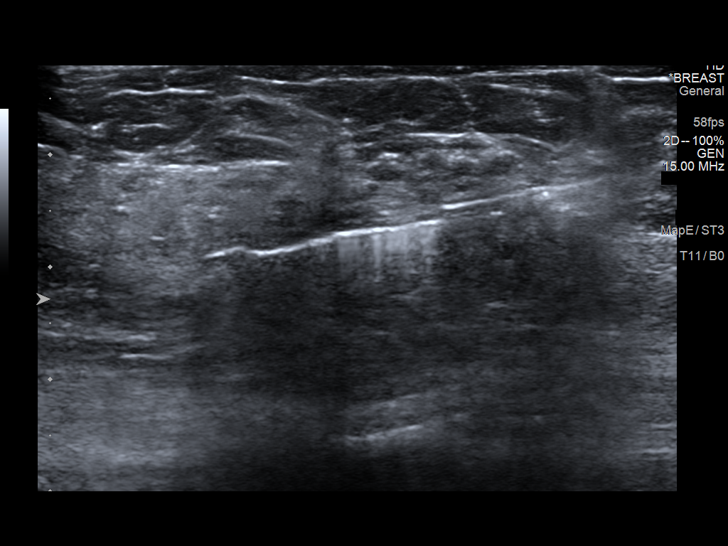
[im 9/14]
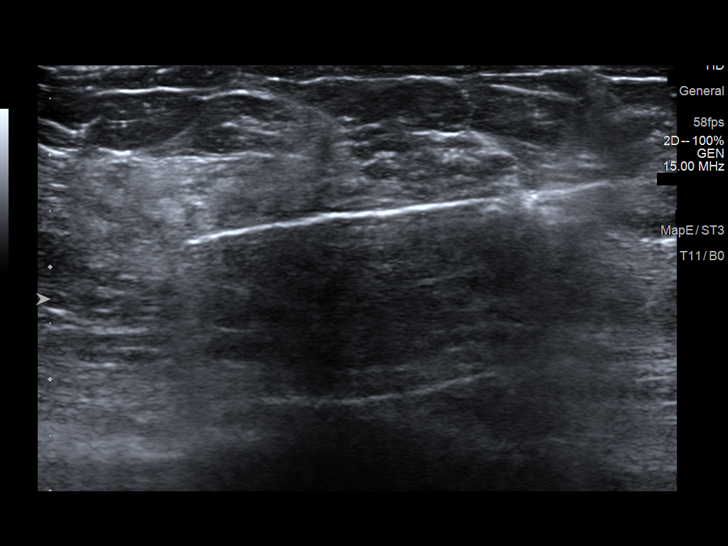
[im 10/14]
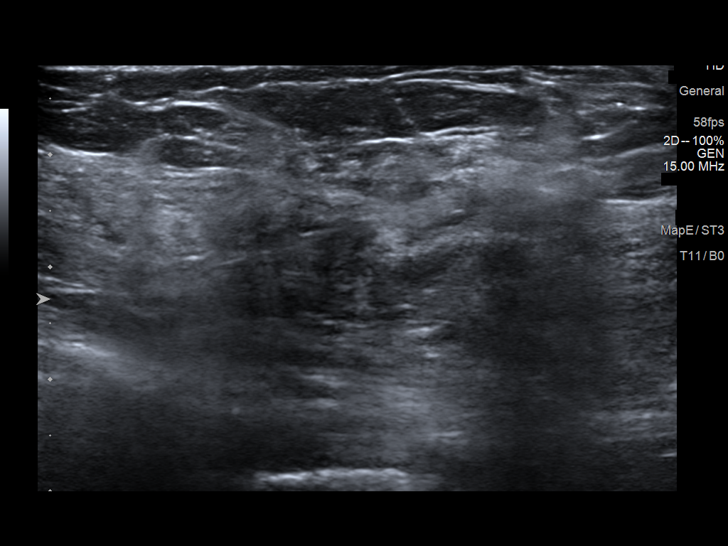
[im 12/14]
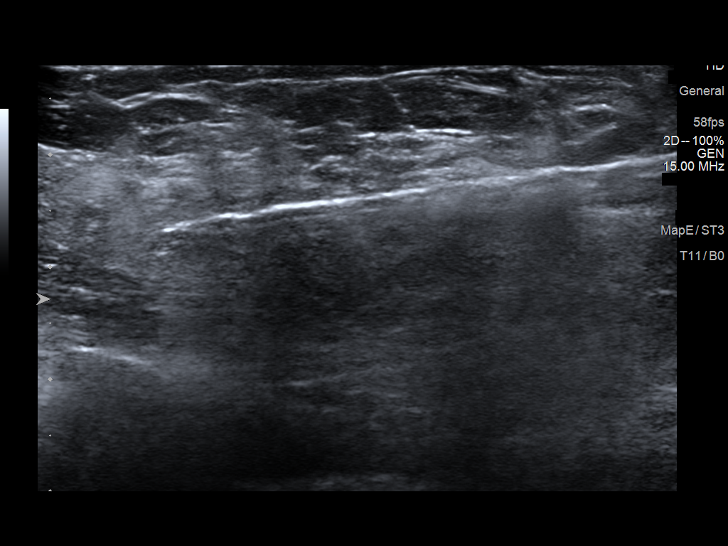
[im 13/14]
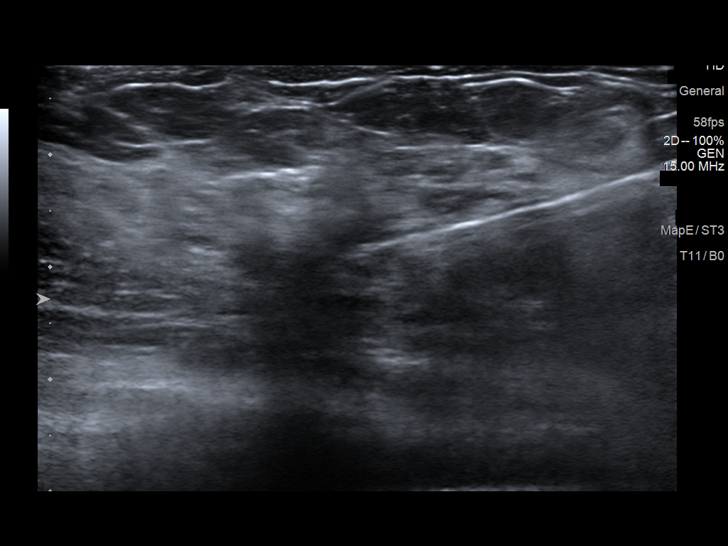
[im 14/14]
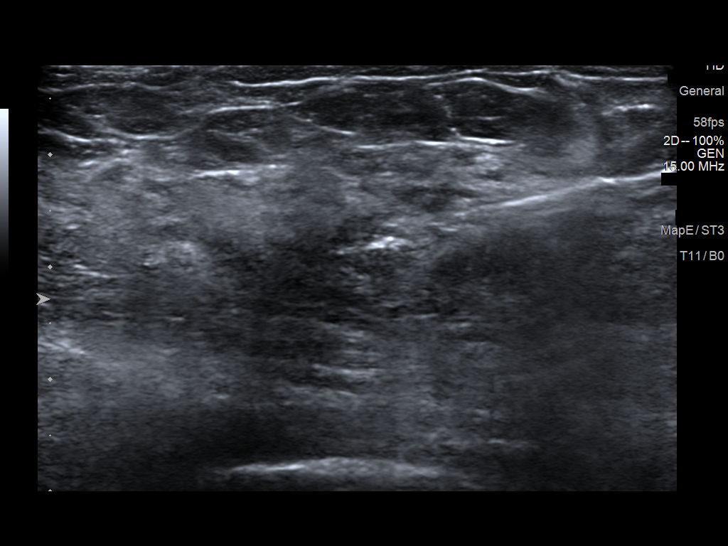

[12 of 14 positions shown; findings below may reference images not displayed]



Lesion quadrant: Upper inner quadrant

Using sterile technique and 1% Lidocaine as local anesthetic, under
direct ultrasound visualization, a 14 gauge Daquan device was
used to perform biopsy of a shadowing mass in the right breast at 2
o'clock, 1 cm from the nipple using an inferior approach. At the
conclusion of the procedure a ribbon shaped tissue marker clip was
deployed into the biopsy cavity. Follow up 2 view mammogram was
performed and dictated separately.
IMPRESSION: Ultrasound guided biopsy of a right breast mass at 2 o'clock. No
apparent complications.

ADDENDUM:
Pathology revealed GRADE II INVASIVE MAMMARY CARCINOMA of the RIGHT
breast, 2 o'clock, 6cmfn (ribbon clip). This was found to be
concordant by Dr. Cuculescu Hristov.

Pathology results were discussed with the patient by telephone. The
patient reported doing well after the biopsy with tenderness at the
site. Post biopsy instructions and care were reviewed and questions
were answered. The patient was encouraged to call The [REDACTED]

Per request of provider Dr. Shouji Midori, surgical consultation has
been arranged with Dr. Ambrocio Klein at [REDACTED] on
September 24, 2020.

Pathology results reported by Byrlinda Baillergeau RN on 09/19/2021.



Lesion quadrant: Upper inner quadrant

Using sterile technique and 1% Lidocaine as local anesthetic, under
direct ultrasound visualization, a 14 gauge Daquan device was
used to perform biopsy of a shadowing mass in the right breast at 2
o'clock, 1 cm from the nipple using an inferior approach. At the
conclusion of the procedure a ribbon shaped tissue marker clip was
deployed into the biopsy cavity. Follow up 2 view mammogram was
performed and dictated separately.
IMPRESSION: Ultrasound guided biopsy of a right breast mass at 2 o'clock. No
apparent complications.

## 2023-03-25 ENCOUNTER — Telehealth: Payer: Self-pay | Admitting: Hematology and Oncology

## 2023-03-25 NOTE — Telephone Encounter (Signed)
Patient is aware of scheduled appointment times/dates

## 2023-04-07 DIAGNOSIS — I499 Cardiac arrhythmia, unspecified: Secondary | ICD-10-CM | POA: Diagnosis not present

## 2023-04-20 NOTE — Progress Notes (Signed)
Heart monitor shows some irregular and fast rhythms   and lots of pvcs . Apologies of the late  message ( I thought you were seeing  cardiology) any way . Please refer to cardiology ;dx is "frequent pvcs  and NSVT" for   opinion and interventions .

## 2023-04-22 ENCOUNTER — Telehealth: Payer: Self-pay | Admitting: Internal Medicine

## 2023-04-22 ENCOUNTER — Other Ambulatory Visit: Payer: Self-pay

## 2023-04-22 DIAGNOSIS — I4729 Other ventricular tachycardia: Secondary | ICD-10-CM

## 2023-04-22 DIAGNOSIS — I493 Ventricular premature depolarization: Secondary | ICD-10-CM

## 2023-04-22 NOTE — Telephone Encounter (Signed)
Pt's son called, returning CMA's call. CMA was with a patient. Pt asked that CMA call back at her earliest convenience.

## 2023-04-22 NOTE — Telephone Encounter (Signed)
Spoke to pt's son. See lab result note.

## 2023-04-27 ENCOUNTER — Inpatient Hospital Stay: Payer: Medicare Other | Attending: Hematology and Oncology | Admitting: Hematology and Oncology

## 2023-04-27 VITALS — BP 127/64 | HR 56 | Temp 97.5°F | Resp 18 | Ht 62.0 in | Wt 113.9 lb

## 2023-04-27 DIAGNOSIS — G4700 Insomnia, unspecified: Secondary | ICD-10-CM | POA: Insufficient documentation

## 2023-04-27 DIAGNOSIS — Z79899 Other long term (current) drug therapy: Secondary | ICD-10-CM | POA: Insufficient documentation

## 2023-04-27 DIAGNOSIS — Z170 Estrogen receptor positive status [ER+]: Secondary | ICD-10-CM | POA: Insufficient documentation

## 2023-04-27 DIAGNOSIS — C50211 Malignant neoplasm of upper-inner quadrant of right female breast: Secondary | ICD-10-CM | POA: Insufficient documentation

## 2023-04-27 NOTE — Progress Notes (Signed)
Patient Care Team: [name], Neta Mends, MD as PCP - General Dahlstedt, Jeannett Senior, MD (Urology) Pershing Proud, RN as Oncology Nurse Navigator Donnelly Angelica, RN as Oncology Nurse Navigator Samson Frederic, MD as Consulting Physician (Orthopedic Surgery)  DIAGNOSIS:  Encounter Diagnosis  Name Primary?   Malignant neoplasm of upper-inner quadrant of right breast in female, estrogen receptor positive (HCC) Yes    SUMMARY OF ONCOLOGIC HISTORY: Oncology History  Malignant neoplasm of upper-inner quadrant of right breast in female, estrogen receptor positive (HCC)  09/26/2021 Initial Diagnosis   Screening mammogram detected right breast distortion indeterminate size, 2 o'clock position biopsy revealed invasive lobular cancer ER 95%, PR 5%, Ki-67 5%, HER2 1+ negative     CHIEF COMPLIANT: Surveillance of breast cancer  Discussed the use of AI scribe software for clinical note transcription with the patient, who gave verbal consent to proceed.  History of Present Illness   The patient, with a history of breast cancer and recent hip fracture, presents for follow-up. She reports that the hip fracture occurred after a fall caused by a recycling bin. She has been recovering well from the fracture, but note that one leg feels longer than the other. She is ambulating without a cane most of the time and have resumed household chores. She is not currently driving but plan to resume soon.  The patient had been on anastrozole for a year and a half. She stopped the medication about two to three weeks ago due to concerns about side effects, including fatigue and dental issues. She is scheduled for a mammogram in January.  The patient also reports insomnia, waking up about four times a night due to bladder issues. She have not noticed a significant change in energy levels since stopping the anastrozole. She have been trying to eat more due to weight loss, but have noticed a recent weight gain from 105 to 112  pounds.  The patient also mentions concerns about her heart, noting that it has been skipping beats. She have not provided further details about this issue.         ALLERGIES:  is allergic to macrobid [nitrofurantoin] and sulfamethoxazole.  MEDICATIONS:  Current Outpatient Medications  Medication Sig Dispense Refill   amLODipine (NORVASC) 5 MG tablet TAKE 1 TABLET(5 MG) BY MOUTH DAILY 90 tablet 1   Ascorbic Acid (VITAMIN C) 500 MG tablet Take 500 mg by mouth daily.     aspirin EC 81 MG tablet Take 1 tablet (81 mg total) by mouth 2 (two) times daily. Swallow whole. 30 tablet 12   Calcium Carb-Cholecalciferol 600-800 MG-UNIT TABS Take 2 tablets by mouth daily.     metoCLOPramide (REGLAN) 5 MG/5ML solution Take 5 mLs (5 mg total) by mouth 3 (three) times daily before meals. 120 mL 0   Multiple Vitamins-Minerals (CENTRUM SILVER PO) Take 1 tablet by mouth daily.     acetaminophen (TYLENOL) 325 MG tablet Take 1-2 tablets (325-650 mg total) by mouth every 6 (six) hours as needed for mild pain (pain score 1-3 or temp > 100.5). (Patient not taking: Reported on 03/09/2023)     letrozole (FEMARA) 2.5 MG tablet TAKE 1 TABLET(2.5 MG) BY MOUTH DAILY 90 tablet 3   loratadine (CLARITIN) 10 MG tablet Take 10 mg by mouth daily as needed for allergies. (Patient not taking: Reported on 03/09/2023)     methocarbamol (ROBAXIN) 500 MG tablet Take 1 tablet (500 mg total) by mouth every 6 (six) hours as needed for muscle spasms. (Patient not  taking: Reported on 03/09/2023) 60 tablet 0   metoprolol succinate (TOPROL-XL) 25 MG 24 hr tablet Take 0.5 tablets (12.5 mg total) by mouth daily. (Patient not taking: Reported on 03/09/2023) 30 tablet 2   pantoprazole (PROTONIX) 40 MG tablet Take 1 tablet (40 mg total) by mouth daily. (Patient not taking: Reported on 03/09/2023) 30 tablet 0   polyethylene glycol (MIRALAX / GLYCOLAX) 17 g packet Take 17 g by mouth daily. (Patient not taking: Reported on 03/09/2023) 14 each 0   simethicone  (MYLICON) 40 MG/0.6ML drops Take 1.2 mLs (80 mg total) by mouth 4 (four) times daily as needed for flatulence. (Patient not taking: Reported on 03/09/2023) 30 mL 0   ziprasidone (GEODON) 20 MG capsule Take 1 capsule (20 mg total) by mouth 3 times/day as needed-between meals & bedtime (agitation/anxiety/combative). (Patient not taking: Reported on 03/09/2023) 10 capsule 0   No current facility-administered medications for this visit.    PHYSICAL EXAMINATION: ECOG PERFORMANCE STATUS: 1 - Symptomatic but completely ambulatory  Vitals:   04/27/23 1507  BP: 127/64  Pulse: (!) 56  Resp: 18  Temp: (!) 97.5 F (36.4 C)  SpO2: 96%   Filed Weights   04/27/23 1507  Weight: 113 lb 14.4 oz (51.7 kg)    Physical Exam   MEASUREMENTS: WT- 112      (exam performed in the presence of a chaperone)  LABORATORY DATA:  I have reviewed the data as listed    Latest Ref Rng & Units 01/18/2023    9:44 AM 01/18/2023    6:13 AM 01/17/2023   12:08 PM  CMP  Glucose 70 - 99 mg/dL  99  202   BUN 8 - 23 mg/dL  17  20   Creatinine 5.42 - 1.00 mg/dL  7.06  2.37   Sodium 628 - 145 mmol/L  140  138   Potassium 3.5 - 5.1 mmol/L  4.0  4.1   Chloride 98 - 111 mmol/L  103  99   CO2 22 - 32 mmol/L  24  23   Calcium 8.9 - 10.3 mg/dL  8.5  9.2   Total Protein 6.5 - 8.1 g/dL 6.0   7.1   Total Bilirubin 0.3 - 1.2 mg/dL 1.1   1.3   Alkaline Phos 38 - 126 U/L 134   169   AST 15 - 41 U/L 22   25   ALT 0 - 44 U/L 23   25     Lab Results  Component Value Date   WBC 6.5 01/18/2023   HGB 11.9 (L) 01/18/2023   HCT 37.3 01/18/2023   MCV 99.2 01/18/2023   PLT 218 01/18/2023   NEUTROABS 7.9 (H) 01/17/2023    ASSESSMENT & PLAN:    Assessment and Plan    Breast Cancer Patient has a history of breast cancer and was on Anastrozole for 1.5 years. She stopped taking the medication 2-3 weeks ago due to concerns about side effects. Last mammogram in January showed no active cancer. -Discontinue Anastrozole as per  patient's preference and doctor's agreement. -Schedule mammogram in January for continued monitoring.  Hip Fracture Patient had a fall resulting in a hip fracture. She is currently recovering and has resumed driving. -Continue current management and physical therapy as needed.  Return to clinic on an as-needed basis No orders of the defined types were placed in this encounter.  The patient has a good understanding of the overall plan. she agrees with it. she will call with any  problems that may develop before the next visit here. Total time spent: 30 mins including face to face time and time spent for planning, charting and co-ordination of care   Tamsen Meek, MD 04/27/23

## 2023-05-10 DIAGNOSIS — S72141D Displaced intertrochanteric fracture of right femur, subsequent encounter for closed fracture with routine healing: Secondary | ICD-10-CM | POA: Diagnosis not present

## 2023-05-26 ENCOUNTER — Encounter: Payer: Medicare Other | Admitting: Internal Medicine

## 2023-06-03 DIAGNOSIS — H2513 Age-related nuclear cataract, bilateral: Secondary | ICD-10-CM | POA: Diagnosis not present

## 2023-07-08 ENCOUNTER — Encounter: Payer: Self-pay | Admitting: Internal Medicine

## 2023-07-08 ENCOUNTER — Ambulatory Visit: Payer: Medicare Other | Admitting: Internal Medicine

## 2023-07-08 VITALS — BP 120/74 | HR 54 | Temp 98.0°F | Ht 62.0 in | Wt 113.4 lb

## 2023-07-08 DIAGNOSIS — Z170 Estrogen receptor positive status [ER+]: Secondary | ICD-10-CM

## 2023-07-08 DIAGNOSIS — I10 Essential (primary) hypertension: Secondary | ICD-10-CM

## 2023-07-08 DIAGNOSIS — C50211 Malignant neoplasm of upper-inner quadrant of right female breast: Secondary | ICD-10-CM

## 2023-07-08 DIAGNOSIS — R739 Hyperglycemia, unspecified: Secondary | ICD-10-CM

## 2023-07-08 DIAGNOSIS — Z79899 Other long term (current) drug therapy: Secondary | ICD-10-CM

## 2023-07-08 DIAGNOSIS — R351 Nocturia: Secondary | ICD-10-CM

## 2023-07-08 DIAGNOSIS — H9193 Unspecified hearing loss, bilateral: Secondary | ICD-10-CM | POA: Diagnosis not present

## 2023-07-08 DIAGNOSIS — Z8781 Personal history of (healed) traumatic fracture: Secondary | ICD-10-CM

## 2023-07-08 DIAGNOSIS — R54 Age-related physical debility: Secondary | ICD-10-CM

## 2023-07-08 LAB — POCT GLYCOSYLATED HEMOGLOBIN (HGB A1C): Hemoglobin A1C: 5.5 % (ref 4.0–5.6)

## 2023-07-08 NOTE — Patient Instructions (Addendum)
Agree with hearing assistance .    Advise get   to see if can help with   bladder nocturia  and sleep.  Otherwise I can refer  to uro gyne to decide I on helpful  interventions  Wt Readings from Last 3 Encounters:  07/08/23 113 lb 6.4 oz (51.4 kg)  04/27/23 113 lb 14.4 oz (51.7 kg)  03/09/23 112 lb 12.8 oz (51.2 kg)   .  Want you to get healthy weight   If back pain is is getting worse  we can do more evaluation and get an   x ray .  I think  your family should help you get  internet. Set up.   Start looking onto  other transportation options. . In future . .  ? Assisted living? Other.  Arrangements  as possible.   ? Consider   medication for  bone health  and prevent future  fracture such as    fosamax .  Or prolia   Your A1c test is good   no elevation of sugar .

## 2023-07-08 NOTE — Progress Notes (Signed)
Chief Complaint  Patient presents with   Medical Management of Chronic Issues    HPI: [name] [name] 89 y.o. come in for Chronic medical management   "Hip is least of problem. "  Was frustrated that she felt she couldn't drive  and the ortho presumed she could ( I had told her it was up to them but it appears they never gave sopecific direction)  Needs to renew lisence  in February.   Some concerns  feels need to drive cause of needs and husband  health status  Trying to get hearing aids. Cost  and idduse also  ROS: See pertinent positives and negatives per HPI. Get s back pain not specific or progressive   Past Medical History:  Diagnosis Date   Adenomatous colon polyp     4 2008   Breast cancer (HCC) 1978   left breast   Complication of anesthesia    Cystitis, interstitial    FRACTURE, ANKLE, LEFT 12/14/2006   Qualifier: Diagnosis of  By: Lawernce Ion, CMA (AAMA), Shannon S    FRACTURE, WRIST 12/14/2006   Qualifier: Diagnosis of  By: Lawernce Ion, CMA (AAMA), Bethann Berkshire    Hearing loss    does not use hearing aids   Hyperlipidemia    Hypertension    Osteoarthritis    PONV (postoperative nausea and vomiting)    Episode after Ether   Rotator cuff tear, right    Shingles    Urinary incontinence     Family History  Problem Relation Age of Onset   Suicidality Mother    Heart attack Father    Cancer - Other Daughter        throat cancer    Thyroid disease Neg Hx     Social History   Socioeconomic History   Marital status: Married    Spouse name: Not on file   Number of children: 3   Years of education: Not on file   Highest education level: Not on file  Occupational History    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  Tobacco Use   Smoking status: Former    Current packs/day: 0.25    Average packs/day: 0.3 packs/day for 15.0 years (3.8 ttl pk-yrs)    Types: Cigarettes   Smokeless tobacco: Never   Tobacco comments:    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 not interested in caregiver or respite support   Enjoys reading, walking around the house.   Social Drivers of Health   Financial Resource Strain: Low Risk  (09/23/2021)   Overall Financial Resource Strain (CARDIA)    Difficulty of Paying Living Expenses: Not hard at all  Food Insecurity: No Food Insecurity (01/21/2023)   Hunger Vital Sign  Worried About Programme researcher, broadcasting/film/video in the Last Year: Never true    Ran Out of Food in the Last Year: Never true  Transportation Needs: No Transportation Needs (01/21/2023)   PRAPARE - Administrator, Civil Service (Medical): No    Lack of Transportation (Non-Medical): No  Physical Activity: Inactive (09/23/2021)   Exercise Vital Sign    Days of Exercise per Week: 0 days    Minutes of Exercise per Session: 0 min  Stress: Stress Concern Present (09/23/2021)   [employer] of Occupational Health - Occupational Stress Questionnaire    Feeling of Stress : To some extent  Social Connections: Unknown (09/19/2019)   Social Connection and Isolation Panel [NHANES]    Frequency of Communication with Friends and Family: More than three times a week    Frequency of Social Gatherings with Friends and Family: Once a week    Attends Religious Services: Not on Marketing executive or Organizations: Not on file    Attends Banker Meetings: Not on file     Marital Status: Married    Outpatient Medications Prior to Visit  Medication Sig Dispense Refill   amLODipine (NORVASC) 5 MG tablet TAKE 1 TABLET(5 MG) BY MOUTH DAILY 90 tablet 1   Ascorbic Acid (VITAMIN C) 500 MG tablet Take 500 mg by mouth daily.     aspirin EC 81 MG tablet Take 1 tablet (81 mg total) by mouth 2 (two) times daily. Swallow whole. 30 tablet 12   Calcium Carb-Cholecalciferol 600-800 MG-UNIT TABS Take 2 tablets by mouth daily.     MELATONIN PO Take by mouth as needed.     Multiple Vitamins-Minerals (CENTRUM SILVER PO) Take 1 tablet by mouth daily.     acetaminophen (TYLENOL) 325 MG tablet Take 1-2 tablets (325-650 mg total) by mouth every 6 (six) hours as needed for mild pain (pain score 1-3 or temp > 100.5). (Patient not taking: Reported on 03/09/2023)     letrozole (FEMARA) 2.5 MG tablet TAKE 1 TABLET(2.5 MG) BY MOUTH DAILY (Patient not taking: Reported on 07/08/2023) 90 tablet 3   loratadine (CLARITIN) 10 MG tablet Take 10 mg by mouth daily as needed for allergies. (Patient not taking: Reported on 03/09/2023)     methocarbamol (ROBAXIN) 500 MG tablet Take 1 tablet (500 mg total) by mouth every 6 (six) hours as needed for muscle spasms. (Patient not taking: Reported on 03/09/2023) 60 tablet 0   metoCLOPramide (REGLAN) 5 MG/5ML solution Take 5 mLs (5 mg total) by mouth 3 (three) times daily before meals. (Patient not taking: Reported on 07/08/2023) 120 mL 0   metoprolol succinate (TOPROL-XL) 25 MG 24 hr tablet Take 0.5 tablets (12.5 mg total) by mouth daily. (Patient not taking: Reported on 03/09/2023) 30 tablet 2   pantoprazole (PROTONIX) 40 MG tablet Take 1 tablet (40 mg total) by mouth daily. (Patient not taking: Reported on 03/09/2023) 30 tablet 0   polyethylene glycol (MIRALAX / GLYCOLAX) 17 g packet Take 17 g by mouth daily. (Patient not taking: Reported on 03/09/2023) 14 each 0   simethicone (MYLICON) 40 MG/0.6ML drops Take 1.2 mLs (80 mg total) by mouth 4 (four) times daily as needed for  flatulence. (Patient not taking: Reported on 03/09/2023) 30 mL 0   ziprasidone (GEODON) 20 MG capsule Take 1 capsule (20 mg total) by mouth 3 times/day as needed-between meals & bedtime (agitation/anxiety/combative). (Patient not taking: Reported on 03/09/2023) 10 capsule 0   No  facility-administered medications prior to visit.     EXAM:  BP 120/74 (BP Location: Left Arm, Patient Position: Sitting, Cuff Size: Normal)   Pulse (!) 54   Temp 98 F (36.7 C) (Oral)   Ht 5\' 2"  (1.575 m)   Wt 113 lb 6.4 oz (51.4 kg)   SpO2 96%   BMI 20.74 kg/m   Body mass index is 20.74 kg/m. Wt Readings from Last 3 Encounters:  07/08/23 113 lb 6.4 oz (51.4 kg)  04/27/23 113 lb 14.4 oz (51.7 kg)  03/09/23 112 lb 12.8 oz (51.2 kg)    GENERAL: vitals reviewed and listed above, alert, oriented, appears well hydrated and in no acute distress HEENT: atraumatic, conjunctiva  clear, no obvious abnormalities on inspection of external nose and ears hard of hearing  but cognition and cover sation intact  NECK: no obvious masses on inspection palpation  LUNGS: clear to auscultation bilaterally, no wheezes, rales or rhonchi, good air movement CV: HRRR, no clubbing cyanosis or  peripheral edema nl cap refill  MS: ambulatory  PSYCH: pleasant and cooperative, no obvious depression or anxiety Lab Results  Component Value Date   WBC 6.5 01/18/2023   HGB 11.9 (L) 01/18/2023   HCT 37.3 01/18/2023   PLT 218 01/18/2023   GLUCOSE 99 01/18/2023   CHOL 217 (H) 05/20/2022   TRIG 89.0 05/20/2022   HDL 63.60 05/20/2022   LDLDIRECT 165.0 12/07/2006   LDLCALC 135 (H) 05/20/2022   ALT 23 01/18/2023   AST 22 01/18/2023   NA 140 01/18/2023   K 4.0 01/18/2023   CL 103 01/18/2023   CREATININE 0.70 01/18/2023   BUN 17 01/18/2023   CO2 24 01/18/2023   TSH 1.56 05/20/2022   INR 1.1 12/31/2022   HGBA1C 5.5 07/08/2023   BP Readings from Last 3 Encounters:  07/08/23 120/74  04/27/23 127/64  03/09/23 114/64    ASSESSMENT  AND PLAN:  Discussed the following assessment and plan:  Hyperglycemia - Plan: POC HgB A1c  Medication management  Decreased hearing of both ears  Advanced age  Essential hypertension  Nocturia  History of fracture of right hip  Malignant neoplasm of upper-inner quadrant of right breast in female, estrogen receptor positive (HCC) On going sx no alarm findings short term driving   but could plan for future situations  Hearing issues interfere  Nocturia but not in day uncertain what would help .    Offered referral   Bone health   no decision made but  disc med  poss for bone health  but may need updated dexa  had reluctance cause of poss se issues   Time 45 minutes   patient reporting and discussing situation and her story   and situatoin.  -Patient advised to return or notify health care team  if  new concerns arise.  Patient Instructions  Agree with hearing assistance .    Advise get   to see if can help with   bladder nocturia  and sleep.  Otherwise I can refer  to uro gyne to decide I on helpful  interventions  Wt Readings from Last 3 Encounters:  07/08/23 113 lb 6.4 oz (51.4 kg)  04/27/23 113 lb 14.4 oz (51.7 kg)  03/09/23 112 lb 12.8 oz (51.2 kg)   .  Want you to get healthy weight   If back pain is is getting worse  we can do more evaluation and get an   x ray .  I think  your family  should help you get  internet. Set up.   Start looking onto  other transportation options. . In future . .  ? Assisted living? Other.  Arrangements  as possible.   ? Consider   medication for  bone health  and prevent future  fracture such as    fosamax .  Or prolia   Your A1c test is good   no elevation of sugar .     Neta Mends. Liyah Higham M.D.

## 2023-08-13 ENCOUNTER — Other Ambulatory Visit: Payer: Self-pay

## 2023-08-16 ENCOUNTER — Other Ambulatory Visit: Payer: Self-pay

## 2023-08-18 ENCOUNTER — Other Ambulatory Visit: Payer: Self-pay | Admitting: Internal Medicine

## 2023-12-07 ENCOUNTER — Telehealth: Payer: Self-pay

## 2023-12-07 NOTE — Telephone Encounter (Signed)
 Copied from CRM [id number]. Topic: General - Other >> Dec 07, 2023  3:11 PM Howard Macho wrote: Reason for CRM: patient called stating MD [name] put in a referral for cardiology and she would like to know the doctor name she needs see to make an appointment with  CB [phone]

## 2023-12-08 NOTE — Telephone Encounter (Signed)
 Review pt's referral. Referral status: closed.    Contacted heartcare office [phone]. Spoke to a representative, she inform that pt can still call in and schedule the appt.   Called pt. Pt states she is in a hurry to meet her son. Pt states she can call back tomorrow morning.

## 2023-12-10 NOTE — Telephone Encounter (Signed)
 Patient has a appt scheduled but she wanted to know if she could go ahead and get a referral for a cardiologist before her appt she would like a call back

## 2023-12-10 NOTE — Telephone Encounter (Signed)
 Contacted pt and inform her about her cardiology referral situation. Gave pt number to cardiology office that the referral was sent to.   Advise pt to call them to schedule an appointment. Pt verbalized understanding. Canceled appt for 5/21 as pt states no new sx, she doesn't think provider can do anything beside sending a referral.

## 2023-12-22 ENCOUNTER — Ambulatory Visit: Admitting: Internal Medicine

## 2024-01-05 ENCOUNTER — Other Ambulatory Visit: Payer: Self-pay

## 2024-01-05 DIAGNOSIS — I493 Ventricular premature depolarization: Secondary | ICD-10-CM

## 2024-01-05 DIAGNOSIS — T8859XA Other complications of anesthesia, initial encounter: Secondary | ICD-10-CM | POA: Insufficient documentation

## 2024-01-05 DIAGNOSIS — R112 Nausea with vomiting, unspecified: Secondary | ICD-10-CM | POA: Insufficient documentation

## 2024-01-05 DIAGNOSIS — B029 Zoster without complications: Secondary | ICD-10-CM | POA: Insufficient documentation

## 2024-01-05 DIAGNOSIS — I10 Essential (primary) hypertension: Secondary | ICD-10-CM | POA: Insufficient documentation

## 2024-01-05 DIAGNOSIS — M75101 Unspecified rotator cuff tear or rupture of right shoulder, not specified as traumatic: Secondary | ICD-10-CM | POA: Insufficient documentation

## 2024-01-05 DIAGNOSIS — D126 Benign neoplasm of colon, unspecified: Secondary | ICD-10-CM | POA: Insufficient documentation

## 2024-01-05 HISTORY — DX: Ventricular premature depolarization: I49.3

## 2024-01-06 ENCOUNTER — Ambulatory Visit

## 2024-01-06 VITALS — BP 120/64 | Ht 62.0 in | Wt 111.0 lb

## 2024-01-06 DIAGNOSIS — I493 Ventricular premature depolarization: Secondary | ICD-10-CM

## 2024-01-06 DIAGNOSIS — R0609 Other forms of dyspnea: Secondary | ICD-10-CM | POA: Insufficient documentation

## 2024-01-06 DIAGNOSIS — I10 Essential (primary) hypertension: Secondary | ICD-10-CM

## 2024-01-06 MED ORDER — METOPROLOL SUCCINATE ER 25 MG PO TB24: 25.0000 mg | ORAL_TABLET | Freq: Every day | ORAL | 3 refills | Status: AC

## 2024-01-06 MED ORDER — AMLODIPINE BESYLATE 2.5 MG PO TABS: 2.5000 mg | ORAL_TABLET | Freq: Every day | ORAL | 3 refills | Status: AC

## 2024-01-06 NOTE — Assessment & Plan Note (Signed)
 Progressive symptoms of dyspnea on exertion. Likely age-related versus deconditioning.  Cannot entirely exclude underlying cardiac causes such as ischemia or cardiomyopathy in the setting of frequent ventricular ectopy burden. Does not show any signs of heart failure.  Will defer to PCP to evaluate baseline blood work to rule out any concerns about anemia as I do not see any recent blood work.  evaluation with echocardiogram June 2024 was reassuring  Will further evaluate with cardiac PET/CT stress test for ischemia.

## 2024-01-06 NOTE — Assessment & Plan Note (Signed)
 24% burden on Zio patch monitor from August 2024. Runs of NSVT noted 14 times with the longest episode lasting 4 beats.  These were miotic. Predominantly sinus rhythm with average heart rate 68/min [ranging from 50 to 100 bpm].  She does not seem to be significantly bothered by these palpitations. However she does have significant burden of PVCs, even today in the office she is in a bigeminy pattern.  Will start low-dose beta-blocker Toprol -XL 25 mg once daily. In order to avoid hypotension will lower her current blood pressure medication amlodipine  dose to 2.5 mg once daily.  No significant structural abnormality on echocardiogram from June 2024 other than biatrial dilatation and mild MR.  Will rule out any significant ischemia burden with cardiac PET/CT stress test

## 2024-01-06 NOTE — Patient Instructions (Signed)
 Medication Instructions:  Your physician has recommended you make the following change in your medication:   START: Toprol  XL 25 mg daily START: Amlodipine  2.5 mg daily  *If you need a refill on your cardiac medications before your next appointment, please call your pharmacy*  Lab Work: None If you have labs (blood work) drawn today and your tests are completely normal, you will receive your results only by: MyChart Message (if you have MyChart) OR A paper copy in the mail If you have any lab test that is abnormal or we need to change your treatment, we will call you to review the results.  Testing/Procedures: Cardiac PET Test  Follow-Up: At [facility], you and your health needs are our priority.  As part of our continuing mission to provide you with exceptional heart care, our providers are all part of one team.  This team includes your primary Cardiologist (physician) and Advanced Practice Providers or APPs (Physician Assistants and Nurse Practitioners) who all work together to provide you with the care you need, when you need it.  Your next appointment:   3 month(s)  Provider:   Bertha Broad, MD    We recommend signing up for the patient portal called "MyChart".  Sign up information is provided on this After Visit Summary.  MyChart is used to connect with patients for Virtual Visits (Telemedicine).  Patients are able to view lab/test results, encounter notes, upcoming appointments, etc.  Non-urgent messages can be sent to your provider as well.   To learn more about what you can do with MyChart, go to [email or web address].   Other Instructions None

## 2024-01-06 NOTE — Assessment & Plan Note (Signed)
 Well-controlled. Given advanced age, goal blood pressure below 150/90 mmHg. Currently she is on amlodipine  5 mg once daily, lower the dose to 2.5 mg once daily as we are adding metoprolol  XL 25 mg once daily for PVC management as above.

## 2024-01-06 NOTE — Progress Notes (Signed)
 Cardiology Consultation:    Date:  01/06/2024   ID:  [name] [name], DOB [birth year], MRN [id number]  PCP:  [name] Capra, MD  Cardiologist:  [name] Evans Immanuel Fedak, MD   Referring MD: [name] Capra, MD   No chief complaint on file.    ASSESSMENT AND PLAN:   [name] [name] 89 year old woman with history of Frequent ventricular ectopy 24% burden on heart monitor August 2024, short runs of NSVT, hypertension, hard of hearing, breast cancer right side s/p lumpectomy, also has history of left mastectomy in 1970s, mechanical fall resulting in right hip fracture May 2024 now s/p surgical repair followed by rehab last summer and back to living by herself independently at home. Denies any prior CAD, CHF, MI, CVA history. Here for the visit accompanied by her son who is her POA. She is a good historian but has significant hard of hearing and today at the visit she came in without her hearing aids.  Visit today was essentially for symptoms of shortness of breath that she describes have been more noticeable over the past few months with walking and any kind of exertion at home. She is not significantly bothered by the palpitations.   Problem List Items Addressed This Visit     Essential hypertension   Well-controlled. Given advanced age, goal blood pressure below 150/90 mmHg. Currently she is on amlodipine  5 mg once daily, lower the dose to 2.5 mg once daily as we are adding metoprolol  XL 25 mg once daily for PVC management as above.      Relevant Medications   metoprolol  succinate (TOPROL  XL) 25 MG 24 hr tablet   amLODipine  (NORVASC ) 2.5 MG tablet   Frequent PVCs - Primary   24% burden on Zio patch monitor from August 2024. Runs of NSVT noted 14 times with the longest episode lasting 4 beats.  These were miotic. Predominantly sinus rhythm with average heart rate 68/min [ranging from 50 to 100 bpm].  She does not seem to be significantly bothered by these palpitations. However she does  have significant burden of PVCs, even today in the office she is in a bigeminy pattern.  Will start low-dose beta-blocker Toprol -XL 25 mg once daily. In order to avoid hypotension will lower her current blood pressure medication amlodipine  dose to 2.5 mg once daily.  No significant structural abnormality on echocardiogram from June 2024 other than biatrial dilatation and mild MR.  Will rule out any significant ischemia burden with cardiac PET/CT stress test       Relevant Medications   metoprolol  succinate (TOPROL  XL) 25 MG 24 hr tablet   amLODipine  (NORVASC ) 2.5 MG tablet   Other Relevant Orders   EKG 12-Lead (Completed)   NM PET CT CARDIAC PERFUSION MULTI W/ABSOLUTE BLOODFLOW   Dyspnea on exertion   Progressive symptoms of dyspnea on exertion. Likely age-related versus deconditioning.  Cannot entirely exclude underlying cardiac causes such as ischemia or cardiomyopathy in the setting of frequent ventricular ectopy burden. Does not show any signs of heart failure.  Will defer to PCP to evaluate baseline blood work to rule out any concerns about anemia as I do not see any recent blood work.  evaluation with echocardiogram June 2024 was reassuring  Will further evaluate with cardiac PET/CT stress test for ischemia.       Return to clinic in 3 months.    History of Present Illness:    [name] [name] is a 89 y.o. female who is being seen today for  the evaluation of frequent PVCs [24% burden] and abnormal Holter monitor results from August 2024 at the request of [name], [name], MD.  Pleasant woman here for the visit accompanied by her son who is her POA.  She lives by herself at home.  Her husband passed away a month and a half ago.  Frequent ventricular ectopy 24% burden on heart monitor August 2024, short runs of NSVT, hypertension, hard of hearing, breast cancer right side s/p lumpectomy, also has history of left mastectomy in 1970s, mechanical fall resulting in right hip  fracture May 2024 now s/p surgical repair followed by rehab last summer and back to living by herself independently at home.  Still keeps herself active and does her own day-to-day activities. Denies any chest pain. Does mention shortness of breath which is worse with any activity that she has been doing at home.  Denies any symptoms at rest.  Denies any orthopnea or paroxysmal nocturnal dyspnea. Denies any syncopal or near syncopal episodes. Does report palpitations which she describes as skipped beats and extra beats at times.  Denies any fast heartbeat sensation.  No blood in urine or stools.  Takes her medications consistently.  Denies any smoking, alcohol , drug use.   Heart monitor results 14-day study from 03/17/2023 reported average heart rate 68/min [50 to 100 bpm] sinus rhythm predominantly, with frequent ventricular ectopy burden 24% and rare supraventricular ectopy burden less than 1%.  There were 14 short runs of nonsustained ventricular tachycardia noted with the longest lasting 4 beats.  There were occasional short runs of SVT with the longest episode lasting 39 seconds.  There were 34 patient triggered events and diary entries most of which correlated with sinus rhythm and isolated ventricular and supraventricular ectopic beats and occasionally with ventricular couplets.  The longest episode of SVT also appeared to show wide QRS with aberrancy.  Echocardiogram from January 02, 2023 noted normal biventricular function with LVEF 60 to 65%, grade 1 diastolic dysfunction, biatrial dilatation, mild MR  EKG in the clinic today shows sinus rhythm heart rate 78/min, normal PR interval 166 ms, QRS duration 78 ms with normal axis, with frequent PVCs in a bigeminy fashion appears unifocal, positive in inferior leads and LBBB morphology in precordial leads early transition by lead V3, suggestive of RVOT origin.  No recent lab work available to review.  Past Medical History:  Diagnosis Date    Adenomatous colon polyp     4 2008   BLOOD IN STOOL 07/01/2009   Qualifier: Diagnosis of   By: Sandrea Cruel MD Sung Engels T        Breast cancer [facility]) 1978   left breast   BREAST CANCER, HX OF 12/14/2006   Qualifier: Diagnosis of   By: Bambi Bonine, CMA (AAMA), Shannon S     Left  Mastectomy 1978        Carpal tunnel syndrome of left wrist 11/05/2022   Closed right hip fracture, initial encounter (HCC) 12/31/2022   Complication of anesthesia    Constipation 07/01/2009   Qualifier: Diagnosis of   By: Sandrea Cruel MD Sung Engels T        Decreased hearing 10/27/2013   Essential hypertension 02/13/2007   Qualifier: Diagnosis of   By: Ethel Henry MD, [name]        Ex-smoker 02/21/2011   FASTING HYPERGLYCEMIA 04/05/2007   Qualifier: Diagnosis of   By: Ethel Henry MD, [name]        Fracture of distal end of radius 07/06/2022  FRACTURE, ANKLE, LEFT 12/14/2006   Qualifier: Diagnosis of  By: Bambi Bonine, CMA (AAMA), Michaeline Adolf    FRACTURE, WRIST 12/14/2006   Qualifier: Diagnosis of  By: Bambi Bonine, CMA (AAMA), Shannon S    Frequent PVCs 01/05/2024   Gastrointestinal hemorrhage 07/01/2009   Hearing loss in left ear 10/20/2011   History of colonic polyps 07/01/2009   Qualifier: Diagnosis of   By: Sandrea Cruel MD Sung Engels T     IMO SNOMED Dx Update Oct 2024     Hyperglycemia 12/31/2022   Hyperlipidemia    Hypertension    Hypocalcemia 12/31/2022   Incontinence of feces 07/01/2009   Qualifier: Diagnosis of   By: Sandrea Cruel MD Sung Engels T     IMO SNOMED Dx Update Oct 2024     INTERSTITIAL CYSTITIS 12/14/2006   Qualifier: Diagnosis of   By: Bambi Bonine, CMA (AAMA), Cathleen Coach S        Intertrochanteric fracture of right hip (HCC) 01/07/2023   Malignant neoplasm of upper-inner quadrant of right breast in female, estrogen receptor positive (HCC) 09/26/2021   Malnutrition of moderate degree 01/02/2023   Osteoarthritis    Osteoporosis 10/27/2013   no dexa dclines at this time hx of fracture  taking vit d check level today  tonsure adequate  IMO SNOMED Dx Update Oct 2024     PONV (postoperative nausea and vomiting)    Episode after Ether   Prediabetes 12/31/2022   Rotator cuff tear, right    Shingles    Thyroid  function study abnormality 05/13/2018   Umbilical hernia ? small 10/27/2013   no sx  disc options observe for now if any sx or swelling get evaluated by surgery      URINARY INCONTINENCE 12/14/2006   Qualifier: Diagnosis of   By: Bambi Bonine, CMA (AAMA), Michaeline Adolf        Visit for preventive health examination 10/25/2012   utd declined dexa  And zostavax ( cost)       Past Surgical History:  Procedure Laterality Date   ABDOMINAL HYSTERECTOMY  08/03/1986   fibroids   BREAST BIOPSY Right 09/17/2021   BREAST LUMPECTOMY Right 10/27/2021   BREAST LUMPECTOMY WITH RADIOACTIVE SEED LOCALIZATION Right 10/27/2021   Procedure: RIGHT BREAST LUMPECTOMY WITH RADIOACTIVE SEED LOCALIZATION;  Surgeon: Caralyn Chandler, MD;  Location: [facility] OR;  Service: General;  Laterality: Right;   COLONOSCOPY     DILATION AND CURETTAGE OF UTERUS     x 2   INTRAMEDULLARY (IM) NAIL INTERTROCHANTERIC Right 01/01/2023   Procedure: INTRAMEDULLARY (IM) NAIL INTERTROCHANTERIC;  Surgeon: Adonica Hoose, MD;  Location: [facility] OR[facility];  Service: Orthopedics;  Laterality: Right;   MASTECTOMY Left    SHOULDER ARTHROSCOPY WITH DISTAL CLAVICLE RESECTION Right 12/23/2017   Procedure: SHOULDER ARTHROSCOPY WITH DISTAL CLAVICLE RESECTION;  Surgeon: Ellard Gunning, MD;  Location: [facility] OR;  Service: Orthopedics;  Laterality: Right;   SHOULDER ARTHROSCOPY WITH ROTATOR CUFF REPAIR AND SUBACROMIAL DECOMPRESSION Right 12/23/2017   Procedure: SHOULDER ARTHROSCOPY WITH ROTATOR CUFF REPAIR AND SUBACROMIAL DECOMPRESSION, distal clavicle resection;  Surgeon: Ellard Gunning, MD;  Location: [facility] OR;  Service: Orthopedics;  Laterality: Right;   TONSILLECTOMY     TUBAL LIGATION  08/03/1973   WISDOM TOOTH EXTRACTION     upper wisdom teeth only ext    Current  Medications: Current Meds  Medication Sig   amLODipine  (NORVASC ) 2.5 MG tablet Take 1 tablet (2.5 mg total) by mouth daily.   Ascorbic Acid  (VITAMIN C ) 500 MG tablet Take 500 mg by mouth daily.  aspirin  EC 81 MG tablet Take 1 tablet (81 mg total) by mouth 2 (two) times daily. Swallow whole.   Calcium  Carb-Cholecalciferol  600-800 MG-UNIT TABS Take 2 tablets by mouth daily.   MELATONIN PO Take 1 tablet by mouth as needed (sleep).   metoprolol  succinate (TOPROL  XL) 25 MG 24 hr tablet Take 1 tablet (25 mg total) by mouth daily.   Multiple Vitamins-Minerals (CENTRUM SILVER PO) Take 1 tablet by mouth daily.   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 not interested in caregiver or respite support   Enjoys reading,  walking around the house.   Social Drivers of Corporate investment banker Strain: Low Risk  (09/23/2021)   Overall Financial Resource Strain (CARDIA)    Difficulty of Paying Living Expenses: Not hard at all  Food Insecurity: No Food Insecurity (01/21/2023)   Hunger Vital Sign    Worried About Running Out of Food in the Last Year: Never true    Ran Out of Food in the Last Year: Never true  Transportation Needs: No Transportation Needs (01/21/2023)   PRAPARE - Administrator, Civil Service (Medical): No    Lack of Transportation (Non-Medical): No  Physical Activity: Inactive (09/23/2021)   Exercise Vital Sign    Days of Exercise per Week: 0 days    Minutes of Exercise per Session: 0 min  Stress: Stress Concern Present (09/23/2021)   [employer] of Occupational Health - Occupational Stress Questionnaire    Feeling of Stress : To some extent  Social Connections: Unknown (09/19/2019)   Social Connection and Isolation Panel [NHANES]    Frequency of Communication with Friends and Family: More than three times a week    Frequency of Social Gatherings with Friends and Family: Once a week    Attends Religious Services: Not on Marketing executive or Organizations: Not on file    Attends Banker Meetings: Not on file    Marital Status: Married  Family History: The patient's family history includes Cancer - Other in her daughter; Heart attack in her father; Suicidality in her mother. There is no history of Thyroid  disease. ROS:   Please see the history of present illness.    All 14 point review of systems negative except as described per history of present illness.  EKGs/Labs/Other Studies Reviewed:    The following studies were reviewed today:  9 show follow-up the, EKG:  EKG Interpretation Date/Time:  Thursday January 06 2024 13:07:56 EDT Ventricular Rate:  78 PR Interval:  166 QRS Duration:  78 QT Interval:  396 QTC Calculation: 451 R  Axis:   -1  Text Interpretation: Sinus rhythm with frequent Premature ventricular complexes in a pattern of bigeminy Anterior infarct , age undetermined When compared with ECG of 17-Jan-2023 17:02, No significant change was found Confirmed by Bertha Broad reddy [phone]) on 01/06/2024 1:15:46 PM    Recent Labs: 01/18/2023: ALT 23; BUN 17; Creatinine, Ser 0.70; Hemoglobin 11.9; Platelets 218; Potassium 4.0; Sodium 140  Recent Lipid Panel    Component Value Date/Time   CHOL 217 (H) 05/20/2022 1107   TRIG 89.0 05/20/2022 1107   HDL 63.60 05/20/2022 1107   CHOLHDL 3 05/20/2022 1107   VLDL 17.8 05/20/2022 1107   LDLCALC 135 (H) 05/20/2022 1107   LDLCALC 143 (H) 03/11/2020 1056   LDLDIRECT 165.0 12/07/2006 1533    Physical Exam:    VS:  Ht 5\' 2"  (1.575 m)   Wt 111 lb 0.6 oz (50.4 kg)   BMI 20.31 kg/m     Wt Readings from Last 3 Encounters:  01/06/24 111 lb 0.6 oz (50.4 kg)  07/08/23 113 lb 6.4 oz (51.4 kg)  04/27/23 113 lb 14.4 oz (51.7 kg)     GENERAL:  Well nourished, well developed in no acute distress NECK: No JVD; No carotid bruits CARDIAC: RRR, S1 and S2 present, no murmurs, no rubs, no gallops CHEST:  Clear to auscultation without rales, wheezing or rhonchi  Extremities: No pitting pedal edema. Pulses bilaterally symmetric with radial 2+ and dorsalis pedis 2+ NEUROLOGIC:  Alert and oriented x 3  Medication Adjustments/Labs and Tests Ordered: Current medicines are reviewed at length with the patient today.  Concerns regarding medicines are outlined above.  Orders Placed This Encounter  Procedures   NM PET CT CARDIAC PERFUSION MULTI W/ABSOLUTE BLOODFLOW   EKG 12-Lead   Meds ordered this encounter  Medications   metoprolol  succinate (TOPROL  XL) 25 MG 24 hr tablet    Sig: Take 1 tablet (25 mg total) by mouth daily.    Dispense:  90 tablet    Refill:  3   amLODipine  (NORVASC ) 2.5 MG tablet    Sig: Take 1 tablet (2.5 mg total) by mouth daily.    Dispense:  90 tablet     Refill:  3    Signed, Emmanuell Kantz reddy Geoff Dacanay, MD, MPH, [facility]. 01/06/2024 1:45 PM    [facility]

## 2024-01-20 ENCOUNTER — Encounter: Payer: Medicare Other | Admitting: Internal Medicine

## 2024-01-20 NOTE — Progress Notes (Deleted)
 No chief complaint on file.   HPI: Patient  [name] [name]  89 y.o. comes in today for Preventive Health Care visit   Health Maintenance  Topic Date Due   DEXA SCAN  Never done   Medicare Annual Wellness (AWV)  09/23/2022   COVID-19 Vaccine (5 - 2024-25 season) 04/04/2023   INFLUENZA VACCINE  03/03/2024   DTaP/Tdap/Td (6 - Td or Tdap) 08/03/2027   Pneumococcal Vaccine: 50+ Years  Completed   Zoster Vaccines- Shingrix  Completed   HPV VACCINES  Aged Out   Meningococcal B Vaccine  Aged Out   Health Maintenance Review LIFESTYLE:  Exercise:   Tobacco/ETS: Alcohol :  Sugar beverages: Sleep: Drug use: no HH of  Work:    ROS:  GEN/ HEENT: No fever, significant weight changes sweats headaches vision problems hearing changes, CV/ PULM; No chest pain shortness of breath cough, syncope,edema  change in exercise tolerance. GI /GU: No adominal pain, vomiting, change in bowel habits. No blood in the stool. No significant GU symptoms. SKIN/HEME: ,no acute skin rashes suspicious lesions or bleeding. No lymphadenopathy, nodules, masses.  NEURO/ PSYCH:  No neurologic signs such as weakness numbness. No depression anxiety. IMM/ Allergy: No unusual infections.  Allergy .   REST of 12 system review negative except as per HPI   Past Medical History:  Diagnosis Date   Adenomatous colon polyp     4 2008   BLOOD IN STOOL 07/01/2009   Qualifier: Diagnosis of   By: Sandrea Cruel MD Sung Engels T        Breast cancer [facility]) 1978   left breast   BREAST CANCER, HX OF 12/14/2006   Qualifier: Diagnosis of   By: Bambi Bonine, CMA (AAMA), Shannon S     Left  Mastectomy 1978        Carpal tunnel syndrome of left wrist 11/05/2022   Closed right hip fracture, initial encounter (HCC) 12/31/2022   Complication of anesthesia    Constipation 07/01/2009   Qualifier: Diagnosis of   By: Sandrea Cruel MD Sung Engels T        Decreased hearing 10/27/2013   Essential hypertension 02/13/2007   Qualifier: Diagnosis of    By: Ethel Henry MD, [name]        Ex-smoker 02/21/2011   FASTING HYPERGLYCEMIA 04/05/2007   Qualifier: Diagnosis of   By: Ethel Henry MD, [name]        Fracture of distal end of radius 07/06/2022   FRACTURE, ANKLE, LEFT 12/14/2006   Qualifier: Diagnosis of  By: Bambi Bonine, CMA (AAMA), Michaeline Adolf    FRACTURE, WRIST 12/14/2006   Qualifier: Diagnosis of  By: Bambi Bonine, CMA (AAMA), Shannon S    Frequent PVCs 01/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   Gastrointestinal hemorrhage 07/01/2009   Hearing loss in left ear 10/20/2011   History of colonic polyps 07/01/2009   Qualifier: Diagnosis of   By: Sandrea Cruel MD Sung Engels T     IMO SNOMED Dx Update Oct 2024     Hyperglycemia 12/31/2022   Hyperlipidemia    Hypertension    Hypocalcemia 12/31/2022   Incontinence of feces 07/01/2009   Qualifier: Diagnosis of   By: Sandrea Cruel MD Sung Engels T     IMO SNOMED Dx Update Oct 2024     INTERSTITIAL CYSTITIS 12/14/2006   Qualifier: Diagnosis of   By: Bambi Bonine, CMA (AAMA), Cathleen Coach S        Intertrochanteric fracture of right hip (HCC) 01/07/2023   Malignant neoplasm of upper-inner quadrant of right breast in female, estrogen receptor  positive (HCC) 09/26/2021   Malnutrition of moderate degree 01/02/2023   Osteoarthritis    Osteoporosis 10/27/2013   no dexa dclines at this time hx of fracture  taking vit d check level today tonsure adequate  IMO SNOMED Dx Update Oct 2024     PONV (postoperative nausea and vomiting)    Episode after Ether   Prediabetes 12/31/2022   Rotator cuff tear, right    Shingles    Thyroid  function study abnormality 05/13/2018   Umbilical hernia ? small 10/27/2013   no sx  disc options observe for now if any sx or swelling get evaluated by surgery      URINARY INCONTINENCE 12/14/2006   Qualifier: Diagnosis of   By: Bambi Bonine, CMA (AAMA), Michaeline Adolf        Visit for preventive health examination 10/25/2012   utd declined dexa  And zostavax ( cost)       Past Surgical History:  Procedure Laterality Date   ABDOMINAL  HYSTERECTOMY  08/03/1986   fibroids   BREAST BIOPSY Right 09/17/2021   BREAST LUMPECTOMY Right 10/27/2021   BREAST LUMPECTOMY WITH RADIOACTIVE SEED LOCALIZATION Right 10/27/2021   Procedure: RIGHT BREAST LUMPECTOMY WITH RADIOACTIVE SEED LOCALIZATION;  Surgeon: Caralyn Chandler, MD;  Location: [facility] OR;  Service: General;  Laterality: Right;   COLONOSCOPY     DILATION AND CURETTAGE OF UTERUS     x 2   INTRAMEDULLARY (IM) NAIL INTERTROCHANTERIC Right 01/01/2023   Procedure: INTRAMEDULLARY (IM) NAIL INTERTROCHANTERIC;  Surgeon: Adonica Hoose, MD;  Location: [facility] ORS;  Service: Orthopedics;  Laterality: Right;   MASTECTOMY Left    SHOULDER ARTHROSCOPY WITH DISTAL CLAVICLE RESECTION Right 12/23/2017   Procedure: SHOULDER ARTHROSCOPY WITH DISTAL CLAVICLE RESECTION;  Surgeon: Ellard Gunning, MD;  Location: [facility] OR;  Service: Orthopedics;  Laterality: Right;   SHOULDER ARTHROSCOPY WITH ROTATOR CUFF REPAIR AND SUBACROMIAL DECOMPRESSION Right 12/23/2017   Procedure: SHOULDER ARTHROSCOPY WITH ROTATOR CUFF REPAIR AND SUBACROMIAL DECOMPRESSION, distal clavicle resection;  Surgeon: Ellard Gunning, MD;  Location: [facility] OR;  Service: Orthopedics;  Laterality: Right;   TONSILLECTOMY     TUBAL LIGATION  08/03/1973   WISDOM TOOTH EXTRACTION     upper wisdom teeth only ext    Family History  Problem Relation Age of Onset   Suicidality Mother    Heart attack Father    Cancer - Other Daughter        throat cancer    Thyroid  disease Neg Hx     Social History   Socioeconomic History   Marital status: Married    Spouse name: Not on file   Number of children: 3   Years of education: Not on file   Highest education level: Not on file  Occupational History    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  Tobacco Use   Smoking status: Former    Current packs/day: 0.25    Average packs/day: 0.3 packs/day for 15.0 years (3.8 ttl pk-yrs)    Types: Cigarettes   Smokeless tobacco: Never   Tobacco comments:    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 not interested in caregiver or respite support   Enjoys reading, walking around the house.   Social Drivers of Corporate investment banker Strain: Low Risk  (09/23/2021)   Overall Financial Resource Strain (CARDIA)    Difficulty of Paying Living Expenses: Not hard at all  Food Insecurity: No Food Insecurity (01/21/2023)   Hunger Vital Sign    Worried About Running Out of Food in the Last Year: Never true    Ran Out of Food in the Last Year: Never true  Transportation Needs: No Transportation Needs (01/21/2023)   PRAPARE - Administrator, Civil Service (Medical): No    Lack of Transportation (Non-Medical): No  Physical Activity: Inactive (09/23/2021)   Exercise Vital Sign    Days of Exercise per Week: 0 days    Minutes of Exercise per Session: 0 min  Stress: Stress Concern Present (09/23/2021)   [employer] of Occupational Health - Occupational Stress Questionnaire    Feeling of Stress : To some extent  Social Connections: Unknown (09/19/2019)   Social Connection and Isolation Panel    Frequency of Communication with Friends and Family: More than three times a week    Frequency of Social Gatherings with Friends and Family: Once a week    Attends Religious Services: Not on Marketing executive or Organizations: Not on file    Attends Banker  Meetings: Not on file    Marital Status: Married    Outpatient Medications Prior to Visit  Medication Sig Dispense Refill   amLODipine  (NORVASC ) 2.5 MG tablet Take 1 tablet (2.5 mg total) by mouth daily. 90 tablet 3   Ascorbic Acid  (VITAMIN C ) 500 MG tablet Take 500 mg by mouth daily.     aspirin  EC 81 MG tablet Take 1 tablet (81 mg total) by mouth 2 (two) times daily. Swallow whole. 30 tablet 12   Calcium  Carb-Cholecalciferol  600-800 MG-UNIT TABS Take 2 tablets by mouth daily.     MELATONIN PO Take 1 tablet by mouth as needed (sleep).     methocarbamol  (ROBAXIN ) 500 MG tablet Take 1 tablet (500 mg total) by mouth every 6 (six) hours as needed for muscle spasms. (Patient not taking: Reported on 01/06/2024) 60 tablet 0   metoCLOPramide  (REGLAN ) 5 MG/5ML solution Take 5 mLs (5 mg total) by mouth 3 (three) times daily before meals. (Patient not taking: Reported on 01/06/2024) 120 mL 0   metoprolol  succinate (TOPROL  XL) 25 MG 24 hr tablet Take 1 tablet (25 mg total) by mouth daily. 90 tablet 3   Multiple Vitamins-Minerals (CENTRUM SILVER PO) Take 1 tablet by mouth daily.     pantoprazole  (PROTONIX ) 40 MG tablet Take 1 tablet (40 mg total) by mouth daily. (Patient not taking: Reported on 03/09/2023) 30 tablet 0   polyethylene glycol (MIRALAX  / GLYCOLAX ) 17 g packet Take 17 g by mouth daily. (Patient not taking: Reported on 03/09/2023) 14 each 0   simethicone  (MYLICON) 40 MG/0.6ML drops Take 1.2 mLs (80 mg total) by mouth 4 (four) times daily as needed for flatulence. (Patient not taking: Reported on 01/06/2024) 30  mL 0   ziprasidone  (GEODON ) 20 MG capsule Take 1 capsule (20 mg total) by mouth 3 times/day as needed-between meals & bedtime (agitation/anxiety/combative). (Patient not taking: Reported on 01/06/2024) 10 capsule 0   No facility-administered medications prior to visit.     EXAM:  There were no vitals taken for this visit.  There is no height or weight on file to calculate BMI. Wt Readings from  Last 3 Encounters:  01/06/24 111 lb 0.6 oz (50.4 kg)  07/08/23 113 lb 6.4 oz (51.4 kg)  04/27/23 113 lb 14.4 oz (51.7 kg)    Physical Exam: Vital signs reviewed [id number] is a well-developed well-nourished alert cooperative    who appearsr stated age in no acute distress.  HEENT: normocephalic atraumatic , Eyes: PERRL EOM's full, conjunctiva clear, Nares: paten,t no deformity discharge or tenderness., Ears: no deformity EAC's clear TMs with normal landmarks. Mouth: clear OP, no lesions, edema.  Moist mucous membranes. Dentition in adequate repair. NECK: supple without masses, thyromegaly or bruits. CHEST/PULM:  Clear to auscultation and percussion breath sounds equal no wheeze , rales or rhonchi. No chest wall deformities or tenderness. Breast: normal by inspection . No dimpling, discharge, masses, tenderness or discharge . CV: PMI is nondisplaced, S1 S2 no gallops, murmurs, rubs. Peripheral pulses are full without delay.No JVD .  ABDOMEN: Bowel sounds normal nontender  No guard or rebound, no hepato splenomegal no CVA tenderness.  No hernia. Extremtities:  No clubbing cyanosis or edema, no acute joint swelling or redness no focal atrophy NEURO:  Oriented x3, cranial nerves 3-12 appear to be intact, no obvious focal weakness,gait within normal limits no abnormal reflexes or asymmetrical SKIN: No acute rashes normal turgor, color, no bruising or petechiae. PSYCH: Oriented, good eye contact, no obvious depression anxiety, cognition and judgment appear normal. LN: no cervical axillary inguinal adenopathy  Lab Results  Component Value Date   WBC 6.5 01/18/2023   HGB 11.9 (L) 01/18/2023   HCT 37.3 01/18/2023   PLT 218 01/18/2023   GLUCOSE 99 01/18/2023   CHOL 217 (H) 05/20/2022   TRIG 89.0 05/20/2022   HDL 63.60 05/20/2022   LDLDIRECT 165.0 12/07/2006   LDLCALC 135 (H) 05/20/2022   ALT 23 01/18/2023   AST 22 01/18/2023   NA 140 01/18/2023   K 4.0 01/18/2023   CL 103 01/18/2023    CREATININE 0.70 01/18/2023   BUN 17 01/18/2023   CO2 24 01/18/2023   TSH 1.56 05/20/2022   INR 1.1 12/31/2022   HGBA1C 5.5 07/08/2023    BP Readings from Last 3 Encounters:  01/06/24 120/64  07/08/23 120/74  04/27/23 127/64   Last vitamin D  Lab Results  Component Value Date   VD25OH 55.64 05/20/2022    Lab results reviewed with patient   ASSESSMENT AND PLAN:  Discussed the following assessment and plan:  No diagnosis found.  Bone density ordered in past and not done for various reasons.  Has hx of fracture  Vit d optimize  in good drang in 10 23  Under eval for change in exercise tolerance dyspnea   Hx of frequent pvs  felt not to be suypmptomatoic  Hx of breast cancer  Caretaker  burderns   No follow-ups on file.  Patient Care Team: Charika Mikelson, [name], MD as PCP - General Dahlstedt, Mara Seminole, MD (Urology) Auther Bo, RN (Inactive) as Oncology Nurse Navigator Alane Hsu, RN as Oncology Nurse Navigator Adonica Hoose, MD as Consulting Physician (Orthopedic Surgery) There are no Patient Instructions  on file for this visit.  Alinda Egolf K. Dreden Rivere M.D.

## 2024-01-25 ENCOUNTER — Telehealth (HOSPITAL_COMMUNITY): Payer: Self-pay | Admitting: *Deleted

## 2024-01-25 NOTE — Telephone Encounter (Signed)
 Patient's son returning call about the patient's upcoming cardiac imaging study; pt's son verbalizes understanding of appt date/time, parking situation and where to check in, pre-test NPO status; name and call back number provided for further questions should they arise  Chantal Requena RN Navigator Cardiac Imaging [facility] [phone] office [phone] cell  Patient's son aware patient is to avoid caffeine 12 hours prior to the cardiac PET appt.

## 2024-01-25 NOTE — Addendum Note (Signed)
 Addended by: LIBORIO HAI REDDY on: 01/25/2024 12:22 PM   Modules accepted: Orders

## 2024-01-25 NOTE — Telephone Encounter (Signed)
Attempted to call patient regarding upcoming cardiac PET appointment. Left message on voicemail with name and callback number  Larey Brick RN Navigator Cardiac Imaging [facility] [phone] Office [phone] Cell  Reminder to avoid caffeine 12 hours prior to study.

## 2024-01-26 ENCOUNTER — Encounter (HOSPITAL_COMMUNITY)
Admission: RE | Admit: 2024-01-26 | Discharge: 2024-01-26 | Disposition: A | Discharge status: Home / Self Care | Source: Ambulatory Visit

## 2024-01-26 ENCOUNTER — Ambulatory Visit: Payer: Self-pay

## 2024-01-26 DIAGNOSIS — I493 Ventricular premature depolarization: Secondary | ICD-10-CM | POA: Insufficient documentation

## 2024-01-26 LAB — NM PET CT CARDIAC PERFUSION MULTI W/ABSOLUTE BLOODFLOW
MBFR: 2.16
Nuc Rest EF: 30 %
Nuc Stress EF: 54 %
Rest MBF: 1 ml/g/min
Rest Nuclear Isotope Dose: 13.1 mCi
ST Depression (mm): 0 mm
Stress MBF: 2.16 ml/g/min
Stress Nuclear Isotope Dose: 13.1 mCi
TID: 1

## 2024-01-26 MED ORDER — REGADENOSON 0.4 MG/5ML IV SOLN
0.4000 mg | Freq: Once | INTRAVENOUS | Status: AC
  Administered 2024-01-26: 0.4 mg via INTRAVENOUS

## 2024-01-26 MED ORDER — RUBIDIUM RB82 GENERATOR (RUBYFILL): 13.1000 | PACK | Freq: Once | INTRAVENOUS | Status: DC

## 2024-01-26 MED ORDER — REGADENOSON 0.4 MG/5ML IV SOLN
INTRAVENOUS | Status: AC
  Filled 2024-01-26: qty 5

## 2024-01-26 NOTE — Progress Notes (Signed)
 Please inform her the stress test results showed no evidence of reduced blood flow to the heart muscle.   Once again noted were the frequent extra beats coming from the lower chambers of the heart, called PVCs which we discussed at the office visit.  Continue management for that with Toprol -XL 25 mg that he started at the office visit recently.  No other major abnormalities overall this is good news.  Will review further at subsequent follow-up visit in 3 months.  Thank you

## 2024-01-31 NOTE — Telephone Encounter (Signed)
 Left message to return call

## 2024-01-31 NOTE — Telephone Encounter (Signed)
-----   Message from [place] R Madireddy sent at 01/26/2024  7:21 PM EDT ----- Please inform her the stress test results showed no evidence of reduced blood flow to the heart muscle.   Once again noted were the frequent extra beats coming from the lower chambers of the heart, called PVCs which we discussed at the office visit.  Continue management for that with Toprol -XL 25 mg that  he started at the office visit recently.  No other major abnormalities overall this is good news.  Will review further at subsequent follow-up visit in 3 months.  Thank you ----- Message ----- From: Interface, Rad Results In Sent: 01/26/2024   2:28 PM EDT To: Alean JONELLE Kobus, MD

## 2024-03-02 ENCOUNTER — Other Ambulatory Visit: Payer: Self-pay | Admitting: Family

## 2024-03-30 ENCOUNTER — Encounter: Payer: Self-pay | Admitting: Internal Medicine

## 2024-03-30 ENCOUNTER — Ambulatory Visit: Admitting: Internal Medicine

## 2024-03-30 VITALS — BP 100/60 | HR 53 | Temp 97.4°F | Ht 62.0 in | Wt 111.4 lb

## 2024-03-30 DIAGNOSIS — I10 Essential (primary) hypertension: Secondary | ICD-10-CM | POA: Diagnosis not present

## 2024-03-30 DIAGNOSIS — Z79899 Other long term (current) drug therapy: Secondary | ICD-10-CM | POA: Diagnosis not present

## 2024-03-30 DIAGNOSIS — H9193 Unspecified hearing loss, bilateral: Secondary | ICD-10-CM

## 2024-03-30 DIAGNOSIS — Z0000 Encounter for general adult medical examination without abnormal findings: Secondary | ICD-10-CM

## 2024-03-30 DIAGNOSIS — Z170 Estrogen receptor positive status [ER+]: Secondary | ICD-10-CM

## 2024-03-30 DIAGNOSIS — R0609 Other forms of dyspnea: Secondary | ICD-10-CM

## 2024-03-30 DIAGNOSIS — R739 Hyperglycemia, unspecified: Secondary | ICD-10-CM

## 2024-03-30 DIAGNOSIS — C50211 Malignant neoplasm of upper-inner quadrant of right female breast: Secondary | ICD-10-CM

## 2024-03-30 DIAGNOSIS — Z8781 Personal history of (healed) traumatic fracture: Secondary | ICD-10-CM

## 2024-03-30 LAB — HEMOGLOBIN A1C: Hgb A1c MFr Bld: 6.5 % (ref 4.6–6.5)

## 2024-03-30 LAB — CBC WITH DIFFERENTIAL/PLATELET
Basophils Absolute: 0 K/uL (ref 0.0–0.1)
Basophils Relative: 0.6 % (ref 0.0–3.0)
Eosinophils Absolute: 0.1 K/uL (ref 0.0–0.7)
Eosinophils Relative: 1.6 % (ref 0.0–5.0)
HCT: 45.1 % (ref 36.0–46.0)
Hemoglobin: 14.9 g/dL (ref 12.0–15.0)
Lymphocytes Relative: 19.3 % (ref 12.0–46.0)
Lymphs Abs: 1.3 K/uL (ref 0.7–4.0)
MCHC: 33.1 g/dL (ref 30.0–36.0)
MCV: 93.3 fl (ref 78.0–100.0)
Monocytes Absolute: 0.5 K/uL (ref 0.1–1.0)
Monocytes Relative: 8.4 % (ref 3.0–12.0)
Neutro Abs: 4.6 K/uL (ref 1.4–7.7)
Neutrophils Relative %: 70.1 % (ref 43.0–77.0)
Platelets: 172 K/uL (ref 150.0–400.0)
RBC: 4.84 Mil/uL (ref 3.87–5.11)
RDW: 13.9 % (ref 11.5–15.5)
WBC: 6.5 K/uL (ref 4.0–10.5)

## 2024-03-30 LAB — LIPID PANEL
Cholesterol: 232 mg/dL — ABNORMAL HIGH (ref 0–200)
HDL: 59 mg/dL (ref >39.00)
LDL Cholesterol: 149 mg/dL — ABNORMAL HIGH (ref 0–99)
NonHDL: 172.73
Total CHOL/HDL Ratio: 4
Triglycerides: 117 mg/dL (ref 0.0–149.0)
VLDL: 23.4 mg/dL (ref 0.0–40.0)

## 2024-03-30 LAB — HEPATIC FUNCTION PANEL
ALT: 27 U/L (ref 0–35)
AST: 28 U/L (ref 0–37)
Albumin: 4.5 g/dL (ref 3.5–5.2)
Alkaline Phosphatase: 69 U/L (ref 39–117)
Bilirubin, Direct: 0.2 mg/dL (ref 0.0–0.3)
Total Bilirubin: 1 mg/dL (ref 0.2–1.2)
Total Protein: 7.5 g/dL (ref 6.0–8.3)

## 2024-03-30 LAB — BASIC METABOLIC PANEL WITH GFR
BUN: 26 mg/dL — ABNORMAL HIGH (ref 6–23)
CO2: 27 meq/L (ref 19–32)
Calcium: 9.9 mg/dL (ref 8.4–10.5)
Chloride: 105 meq/L (ref 96–112)
Creatinine, Ser: 0.99 mg/dL (ref 0.40–1.20)
GFR: 50.2 mL/min — ABNORMAL LOW (ref >60.00)
Glucose, Bld: 108 mg/dL — ABNORMAL HIGH (ref 70–99)
Potassium: 4.2 meq/L (ref 3.5–5.1)
Sodium: 142 meq/L (ref 135–145)

## 2024-03-30 LAB — BRAIN NATRIURETIC PEPTIDE: Pro B Natriuretic peptide (BNP): 534 pg/mL — ABNORMAL HIGH (ref 0.0–100.0)

## 2024-03-30 LAB — TSH: TSH: 1.74 u[IU]/mL (ref 0.35–5.50)

## 2024-03-30 NOTE — Patient Instructions (Addendum)
 Good to see you  today . Work on  letting others drive although agree with being cautious.  Would you take fosamax  for  osteoporosis to decrease  risk of future fracture .   Keep cardiology appt.   Use your hearing aids .  Lab today  and then go from there.

## 2024-03-30 NOTE — Progress Notes (Unsigned)
 Chief Complaint  Patient presents with   Annual Exam    Pt is here for her physical. Pt reports her husband passed in May. Pt reports she has SOB for the past months.     HPI: Patient  [name] [name]  89 y.o. comes in today for Preventive Health Care visit  Didn't bring her hearing aids so difficulty  with some communications . Husband passed  4 mo ago  lots of stress. But no longer has caregiver role. Under cards eval  pvs  no syncope.  Only meds  amlodipine  asa and vitamins . Sleep interrupted  from urination Still drives cautiously loal in day   prefers her son who works ft  when he can help . Declines  bisphosphates because of person she know had multiple fracture and teeth loss form the medication .  Plans on activity as possible  and optimize other for bone health .  Thins she has emphysema   doe  but no new sx . No cough feels exposed to smog when younger.   Health Maintenance  Topic Date Due   DEXA SCAN  Never done   Medicare Annual Wellness (AWV)  09/23/2022   COVID-19 Vaccine (7 - Pfizer risk 2024-25 season) 04/15/2024 (Originally 03/04/2024)   INFLUENZA VACCINE  10/31/2024 (Originally 03/03/2024)   DTaP/Tdap/Td (6 - Td or Tdap) 08/03/2027   Pneumococcal Vaccine: 50+ Years  Completed   Zoster Vaccines- Shingrix  Completed   HPV VACCINES  Aged Out   Meningococcal B Vaccine  Aged Out   Recnetly widowed   son in town can help  working adls except  mowing yard  ROS:  GEN/ HEENT: No fever, significant weight changes sweats headaches vision problems hearing changes, CV/ PULM; No chest pain shortness of breath cough, syncope,edema  change in exercise tolerance. GI /GU: No adominal pain, vomiting, change in bowel habits. No blood in the stool. No significant GU symptoms. SKIN/HEME: ,no acute skin rashes suspicious lesions or bleeding. No lymphadenopathy, nodules, masses.  NEURO/ PSYCH:  No neurologic signs such as weakness numbness. No depression anxiety. IMM/ Allergy: No  unusual infections.  Allergy .   REST of 12 system review negative except as per HPI   Past Medical History:  Diagnosis Date   Adenomatous colon polyp     4 2008   BLOOD IN STOOL 07/01/2009   Qualifier: Diagnosis of   By: Aneita MD NOLIA Gist T        Breast cancer [facility]) 1978   left breast   BREAST CANCER, HX OF 12/14/2006   Qualifier: Diagnosis of   By: Laurice, CMA (AAMA), Shannon S     Left  Mastectomy 1978        Carpal tunnel syndrome of left wrist 11/05/2022   Closed right hip fracture, initial encounter (HCC) 12/31/2022   Complication of anesthesia    Constipation 07/01/2009   Qualifier: Diagnosis of   By: Aneita MD NOLIA Gist T        Decreased hearing 10/27/2013   Essential hypertension 02/13/2007   Qualifier: Diagnosis of   By: Charlett MD, Apolinar POUR        Ex-smoker 02/21/2011   FASTING HYPERGLYCEMIA 04/05/2007   Qualifier: Diagnosis of   By: Charlett MD, Apolinar POUR        Fracture of distal end of radius 07/06/2022   FRACTURE, ANKLE, LEFT 12/14/2006   Qualifier: Diagnosis of  By: Laurice, CMA (AAMA), Clotilda RAMAN    FRACTURE, WRIST 12/14/2006  Qualifier: Diagnosis of  By: Laurice, CMA (AAMA), Shannon S    Frequent PVCs 01/05/2024   Gastrointestinal hemorrhage 07/01/2009   Hearing loss in left ear 10/20/2011   History of colonic polyps 07/01/2009   Qualifier: Diagnosis of   By: Aneita MD NOLIA Gist T     IMO SNOMED Dx Update Oct 2024     Hyperglycemia 12/31/2022   Hyperlipidemia    Hypertension    Hypocalcemia 12/31/2022   Incontinence of feces 07/01/2009   Qualifier: Diagnosis of   By: Aneita MD NOLIA Gist T     IMO SNOMED Dx Update Oct 2024     INTERSTITIAL CYSTITIS 12/14/2006   Qualifier: Diagnosis of   By: Laurice, CMA (AAMA), Clotilda S        Intertrochanteric fracture of right hip (HCC) 01/07/2023   Malignant neoplasm of upper-inner quadrant of right breast in female, estrogen receptor positive (HCC) 09/26/2021   Malnutrition of moderate degree  01/02/2023   Osteoarthritis    Osteoporosis 10/27/2013   no dexa dclines at this time hx of fracture  taking vit d check level today tonsure adequate  IMO SNOMED Dx Update Oct 2024     PONV (postoperative nausea and vomiting)    Episode after Ether   Prediabetes 12/31/2022   Rotator cuff tear, right    Shingles    Thyroid  function study abnormality 05/13/2018   Umbilical hernia ? small 10/27/2013   no sx  disc options observe for now if any sx or swelling get evaluated by surgery      URINARY INCONTINENCE 12/14/2006   Qualifier: Diagnosis of   By: Laurice, CMA (AAMA), Clotilda RAMAN        Visit for preventive health examination 10/25/2012   utd declined dexa  And zostavax ( cost)       Past Surgical History:  Procedure Laterality Date   ABDOMINAL HYSTERECTOMY  08/03/1986   fibroids   BREAST BIOPSY Right 09/17/2021   BREAST LUMPECTOMY Right 10/27/2021   BREAST LUMPECTOMY WITH RADIOACTIVE SEED LOCALIZATION Right 10/27/2021   Procedure: RIGHT BREAST LUMPECTOMY WITH RADIOACTIVE SEED LOCALIZATION;  Surgeon: Curvin Deward MOULD, MD;  Location: [facility] OR;  Service: General;  Laterality: Right;   COLONOSCOPY     DILATION AND CURETTAGE OF UTERUS     x 2   INTRAMEDULLARY (IM) NAIL INTERTROCHANTERIC Right 01/01/2023   Procedure: INTRAMEDULLARY (IM) NAIL INTERTROCHANTERIC;  Surgeon: Fidel Rogue, MD;  Location: [facility];  Service: Orthopedics;  Laterality: Right;   MASTECTOMY Left    SHOULDER ARTHROSCOPY WITH DISTAL CLAVICLE RESECTION Right 12/23/2017   Procedure: SHOULDER ARTHROSCOPY WITH DISTAL CLAVICLE RESECTION;  Surgeon: Melita Drivers, MD;  Location: [facility] OR;  Service: Orthopedics;  Laterality: Right;   SHOULDER ARTHROSCOPY WITH ROTATOR CUFF REPAIR AND SUBACROMIAL DECOMPRESSION Right 12/23/2017   Procedure: SHOULDER ARTHROSCOPY WITH ROTATOR CUFF REPAIR AND SUBACROMIAL DECOMPRESSION, distal clavicle resection;  Surgeon: Melita Drivers, MD;  Location: [facility] OR;  Service: Orthopedics;  Laterality: Right;    TONSILLECTOMY     TUBAL LIGATION  08/03/1973   WISDOM TOOTH EXTRACTION     upper wisdom teeth only ext    Family History  Problem Relation Age of Onset   Suicidality Mother    Heart attack Father    Cancer - Other Daughter        throat cancer    Thyroid  disease Neg Hx     Social History   Socioeconomic History   Marital status: Married    Spouse name: Not on file  Number of children: 3   Years of education: Not on file   Highest education level: Not on file  Occupational History    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  Tobacco Use   Smoking status: Former    Current packs/day: 0.25    Average packs/day: 0.3 packs/day for 15.0 years (3.8 ttl pk-yrs)    Types: Cigarettes   Smokeless tobacco: Never   Tobacco comments:    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 not interested in caregiver or respite support   Enjoys reading, walking around the house.   Social Drivers of Corporate investment banker Strain: Low Risk  (09/23/2021)   Overall Financial Resource Strain (CARDIA)    Difficulty of Paying Living Expenses: Not hard at all  Food Insecurity: No Food Insecurity (01/21/2023)   Hunger Vital Sign    Worried About Running Out of Food in the Last Year: Never true    Ran Out of Food in the Last Year: Never true  Transportation Needs: No Transportation Needs (01/21/2023)   PRAPARE - Therapist, art (Medical): No    Lack of Transportation (Non-Medical): No  Physical Activity: Inactive (09/23/2021)   Exercise Vital Sign    Days of Exercise per Week: 0 days    Minutes of Exercise per Session: 0 min  Stress: Stress Concern Present (09/23/2021)   [employer] of Occupational Health - Occupational Stress Questionnaire    Feeling of Stress : To some extent  Social Connections: Unknown (09/19/2019)   Social Connection and Isolation Panel    Frequency of Communication with Friends and Family: More than three times a week    Frequency of Social Gatherings with Friends and Family: Once a week    Attends Religious Services: Not on Marketing executive or Organizations: Not on file    Attends Banker Meetings: Not on file    Marital Status: Married    Outpatient Medications Prior to Visit  Medication Sig Dispense Refill   amLODipine  (NORVASC ) 2.5 MG tablet Take 1 tablet (2.5 mg total) by mouth daily. 90 tablet 3   Ascorbic Acid  (VITAMIN C ) 500 MG tablet Take 500 mg by mouth daily.     Calcium  Carb-Cholecalciferol  600-800 MG-UNIT TABS Take 2 tablets by mouth daily.     MELATONIN PO Take 1 tablet by mouth as needed (sleep).     Multiple Vitamins-Minerals (CENTRUM SILVER PO) Take 1 tablet by mouth daily.     aspirin  EC 81 MG tablet Take 1 tablet (81 mg total) by mouth 2 (two) times daily. Swallow whole. (Patient not taking: Reported on 03/30/2024) 30 tablet 12   methocarbamol  (ROBAXIN ) 500 MG tablet  Take 1 tablet (500 mg total) by mouth every 6 (six) hours as needed for muscle spasms. (Patient not taking: Reported on 03/30/2024) 60 tablet 0   metoCLOPramide  (REGLAN ) 5 MG/5ML solution Take 5 mLs (5 mg total) by mouth 3 (three) times daily before meals. (Patient not taking: Reported on 03/30/2024) 120 mL 0   metoprolol  succinate (TOPROL  XL) 25 MG 24 hr tablet Take 1 tablet (25 mg total) by mouth daily. (Patient not taking: Reported on 03/30/2024) 90  tablet 3   pantoprazole  (PROTONIX ) 40 MG tablet Take 1 tablet (40 mg total) by mouth daily. (Patient not taking: Reported on 03/30/2024) 30 tablet 0   polyethylene glycol (MIRALAX  / GLYCOLAX ) 17 g packet Take 17 g by mouth daily. (Patient not taking: Reported on 03/30/2024) 14 each 0   simethicone  (MYLICON) 40 MG/0.6ML drops Take 1.2 mLs (80 mg total) by mouth 4 (four) times daily as needed for flatulence. (Patient not taking: Reported on 03/30/2024) 30 mL 0   ziprasidone  (GEODON ) 20 MG capsule Take 1 capsule (20 mg total) by mouth 3 times/day as needed-between meals & bedtime (agitation/anxiety/combative). (Patient not taking: Reported on 03/30/2024) 10 capsule 0   No facility-administered medications prior to visit.     EXAM:  BP 100/60 (BP Location: Left Arm, Patient Position: Sitting, Cuff Size: Normal)   Pulse (!) 53   Temp (!) 97.4 F (36.3 C) (Temporal)   Ht 5' 2 (1.575 m)   Wt 111 lb 6.4 oz (50.5 kg)   SpO2 96%   BMI 20.38 kg/m   Body mass index is 20.38 kg/m. Wt Readings from Last 3 Encounters:  03/30/24 111 lb 6.4 oz (50.5 kg)  01/06/24 111 lb 0.6 oz (50.4 kg)  07/08/23 113 lb 6.4 oz (51.4 kg)    Physical Exam: Vital signs reviewed HZW:Uypd is a well-developed well-nourished alert cooperative    who appearsr stated age in no acute distress.  Very hard of hearing effects the conversation but cognition  intact and is verbal  HEENT: normocephalic atraumatic , Eyes: PERRL EOM's full, conjunctiva clear, Nares: paten,t no deformity discharge or tenderness., Ears: no deformity EAC's clear TMs with normal landmarks. Mouth: clear OP, no lesions, edema.  Moist mucous membranes. Dentition in adequate repair. NECK: supple without masses, thyromegaly or bruits. CHEST/PULM:  Clear to auscultation and percussion breath sounds equal no wheeze , rales or rhonchi. s.some kyphosis scolisis  no tender area  CV: PMI is nondisplaced, S1 S2 no gallops, murmurs, rubs. Peripheral pulses are full  without delay.No JVD .  ABDOMEN: Bowel sounds normal nontender  No guard or rebound, no hepato splenomegal no CVA tenderness.  \ Extremtities:  No clubbing cyanosis or edema, no acute joint swelling or redness no focal atrophy arthritis change  walking  pretty well but has cane  NEURO:  Oriented x3, cranial nerves 3-12 appear to be intact, no obvious focal weakness,gait within normal limits no abnormal reflexes or asymmetrical SKIN: No acute rashes normal turgor, color, no bruising or petechiae. PSYCH: Oriented, good eye contact, no obvious depression anxiety, cognition and judgment appear normal. LN: no cervical axillary adenopathy  Lab Results  Component Value Date   WBC 6.5 03/30/2024   HGB 14.9 03/30/2024   HCT 45.1 03/30/2024   PLT 172.0 03/30/2024   GLUCOSE 108 (H) 03/30/2024   CHOL 232 (H) 03/30/2024   TRIG 117.0 03/30/2024   HDL 59.00 03/30/2024   LDLDIRECT 165.0 12/07/2006   LDLCALC 149 (H) 03/30/2024   ALT 27 03/30/2024  AST 28 03/30/2024   NA 142 03/30/2024   K 4.2 03/30/2024   CL 105 03/30/2024   CREATININE 0.99 03/30/2024   BUN 26 (H) 03/30/2024   CO2 27 03/30/2024   TSH 1.74 03/30/2024   INR 1.1 12/31/2022   HGBA1C 6.5 03/30/2024    BP Readings from Last 3 Encounters:  03/30/24 100/60  01/26/24 122/68  01/06/24 120/64   Last vitamin D  Lab Results  Component Value Date   VD25OH 55.64 05/20/2022   Fasting  lab today  Lab planreviewed with patient   ASSESSMENT AND PLAN:  Discussed the following assessment and plan:    ICD-10-CM   1. Visit for preventive health examination  Z00.00     2. Medication management  Z79.899 Basic metabolic panel with GFR    CBC with Differential/Platelet    Hemoglobin A1c    Hepatic function panel    Lipid panel    TSH    Brain Natriuretic Peptide    3. Hyperglycemia  R73.9 Basic metabolic panel with GFR    CBC with Differential/Platelet    Hemoglobin A1c    Hepatic function panel    Lipid panel    TSH    4.  Essential hypertension  I10 Basic metabolic panel with GFR    CBC with Differential/Platelet    Hemoglobin A1c    Hepatic function panel    Lipid panel    TSH    5. Decreased hearing of both ears  H91.93 Basic metabolic panel with GFR    CBC with Differential/Platelet    Hemoglobin A1c    Hepatic function panel    Lipid panel    TSH    6. Dyspnea on exertion  R06.09 Brain Natriuretic Peptide    7. History of fracture of right hip  Z87.81 Basic metabolic panel with GFR    CBC with Differential/Platelet    Hemoglobin A1c    Hepatic function panel    Lipid panel    TSH    8. Malignant neoplasm of upper-inner quadrant of right breast in female, estrogen receptor positive (HCC)  C50.211 Basic metabolic panel with GFR   Z17.0 CBC with Differential/Platelet    Hemoglobin A1c    Hepatic function panel    Lipid panel    TSH    Encouraged to get hep with hearing assistance   Keep appt  with cards. Exam is stable  She doesn't want to take a bisphosphonate that can break all  you bones  wants to continue  vit d and activity .SABRA  Hearing difficulty interfered with talking about other optinos  ? If evista  with hx of breast cancer ( not anabolic with ? [facility]?) prolia maybe if  affordable.  Return in about 6 months (around 09/30/2024) for depending on results.  Patient Care Team: Vishaal Strollo, Apolinar POUR, MD as PCP - General Dahlstedt, Garnette, MD (Urology) Glean Stephane BROCKS, RN (Inactive) as Oncology Nurse Navigator Tyree Nanetta SAILOR, RN as Oncology Nurse Navigator Fidel Rogue, MD as Consulting Physician (Orthopedic Surgery) Patient Instructions  Good to see you  today . Work on  letting others drive although agree with being cautious.  Would you take fosamax  for  osteoporosis to decrease  risk of future fracture .   Keep cardiology appt.   Use your hearing aids .  Lab today  and then go from there.           Arelene Moroni K. Ondine Gemme M.D.

## 2024-04-04 ENCOUNTER — Ambulatory Visit: Payer: Self-pay | Admitting: Internal Medicine

## 2024-04-04 NOTE — Progress Notes (Signed)
 Blood sugar  up some but  acceptable  hg A1c in borderline diabetic range but  no medication indicated .   Anemia is resolved. Liver and thyroid  tests normal  BNP which  is a hormone elevated with the heart is stressed is elevated  and could l be  related to your shortness of  breath.       I will send info to your cardiology team for them to be aware and opine.

## 2024-04-07 ENCOUNTER — Other Ambulatory Visit: Payer: Self-pay | Admitting: Internal Medicine

## 2024-04-07 ENCOUNTER — Other Ambulatory Visit: Payer: Self-pay | Admitting: Hematology and Oncology

## 2024-04-07 DIAGNOSIS — Z9889 Other specified postprocedural states: Secondary | ICD-10-CM

## 2024-04-07 DIAGNOSIS — Z853 Personal history of malignant neoplasm of breast: Secondary | ICD-10-CM

## 2024-04-10 NOTE — Telephone Encounter (Signed)
Pt son returning call

## 2024-04-12 ENCOUNTER — Ambulatory Visit

## 2024-04-12 NOTE — Telephone Encounter (Signed)
 Spoke with Jodie per DPR. Jodie verbalized understanding but pt has an appointment with [facility] on 9/12 and will discuss changes at that appt.  Labs from PCPs office reviewed.  Mildly elevated proBNP 534. She has no significant ischemia on recent cardiac PET stress test. Last echocardiogram was from June 2024 with mild MR and grade 1 diastolic dysfunction, normal LVEF and biatrial dilatation.   Please schedule her for a follow-up visit with me next available. Please schedule her for a repeat echocardiogram to assess mitral regurgitation. Please prescribe her Lasix 20 mg to be taken once a day and advised her to cut down her salt intake. Thank you

## 2024-04-14 ENCOUNTER — Telehealth: Payer: Self-pay | Admitting: *Deleted

## 2024-04-14 ENCOUNTER — Ambulatory Visit: Admitting: Cardiology

## 2024-04-14 NOTE — Telephone Encounter (Signed)
 Pt son is returning the call. Let pt son know that CMA is out at lunch and she will give him a call back.

## 2024-04-14 NOTE — Telephone Encounter (Signed)
 Copied from CRM #[id number]. Topic: Clinical - Lab/Test Results >> Apr 14, 2024 12:04 PM Chasity T wrote: Reason for CRM: Jodie son of patient is calling to return call to nurse regarding results. >> Apr 14, 2024  2:18 PM Mia F wrote: Pt son Jodie called CMA back. Per CAL she is with pts and will call pt back

## 2024-04-18 NOTE — Telephone Encounter (Signed)
 Spoke with son on 04/14/2024. Please see lab result encounter.

## 2024-04-20 ENCOUNTER — Ambulatory Visit
Admission: RE | Admit: 2024-04-20 | Discharge: 2024-04-20 | Disposition: A | Discharge status: Home / Self Care | Source: Ambulatory Visit | Attending: Internal Medicine | Admitting: Internal Medicine

## 2024-04-20 DIAGNOSIS — Z9889 Other specified postprocedural states: Secondary | ICD-10-CM

## 2024-04-20 DIAGNOSIS — Z853 Personal history of malignant neoplasm of breast: Secondary | ICD-10-CM

## 2024-04-20 DIAGNOSIS — R928 Other abnormal and inconclusive findings on diagnostic imaging of breast: Secondary | ICD-10-CM | POA: Diagnosis not present

## 2024-05-14 NOTE — Progress Notes (Unsigned)
 Cardiology Office Note    Date:  05/16/2024  ID:  ELLSIE [name], DOB Aug 20, 1931, MRN [id number] PCP:  Charlett Apolinar POUR, MD  Cardiologist:  Alean SAUNDERS Madireddy, MD  Electrophysiologist:  None   Chief Complaint: Dyspnea on exertion  History of Present Illness: [name]    KATONYA [name] is a 89 y.o. female with visit-pertinent history of frequent PVCs with 24% burden on cardiac monitor in 03/2023, short runs of NSVT, hypertension, breast cancer s/p right lumpectomy, s/p left mastectomy in 1970s, mechanical fall resulting in right hip fracture in May 2024 s/p surgical repair.  Patient first seen by Dr. Liborio on 01/06/2024 for evaluation of increased shortness of breath, noted to have been more noticeable over the past few months with walking and any exertion at home.  Reported she had not been overall bothered by palpitations.  Nuclear stress test was ordered for further evaluation.  Nuclear stress test on 01/26/2024 was negative for ischemia, study was noted to be intermediate risk as the rest EF noted to be 30%, stress EF 54% it was felt that abnormal resting LVEF was likely artifactual related to PVCs and pattern of bigeminy.  Moderate coronary calcifications were present, pulmonary artery dilation was noted.  Patient was seen by her PCP on 03/30/2024 noted ongoing shortness of breath, BNP was elevated at 534.  Today she presents for follow-up.  She reports that she has been doing very well overall.  She denies any chest pain, lower extremity edema, orthopnea or PND.  She continues to note some shortness of breath with prolonged exertion, on discussion with patient she reports that she goes to [place] every other day, parks as far away as possible and will walk around [place].  She notes that she will walk for an hour and then will feel fatigued and increasingly short of breath and struggles to walk back out to her car.  She reports that this typically will resolve with rest.  Patient denies any  significant palpitations or feeling as though her heart is racing.  She denies any presyncope or syncope.  She regularly walks with a walker.  Patient believes that she has been taking metoprolol , previously reported to her PCP that she was not taking. ROS: .   Today she denies chest pain, lower extremity edema, fatigue, palpitations, melena, hematuria, hemoptysis, diaphoresis, weakness, presyncope, syncope, orthopnea, and PND.  All other systems are reviewed and otherwise negative. Studies Reviewed: [name]   EKG:  EKG is ordered today, personally reviewed, demonstrating  EKG Interpretation Date/Time:  Tuesday May 16 2024 13:53:52 EDT Ventricular Rate:  66 PR Interval:  172 QRS Duration:  86 QT Interval:  414 QTC Calculation: 434 R Axis:   -27  Text Interpretation: Sinus rhythm with frequent Premature ventricular complexes When compared with ECG of 06-Jan-2024 13:07, No significant change was found Confirmed by Cristiano Capri [id number]) on 05/16/2024 5:00:17 PM   CV Studies: Cardiac studies reviewed are outlined and summarized above. Otherwise please see EMR for full report. Cardiac Studies & Procedures   ______________________________________________________________________________________________   STRESS TESTS  NM PET CT CARDIAC PERFUSION MULTI W/ABSOLUTE BLOODFLOW 01/26/2024  Narrative   The study is negative for ischemia.  Abnormal reste LVEF is likely artifactual related to PVCs in the pattern of bigeminy. The study is low risk save for PVCs burden.   The study is intermediate risk.   LV perfusion is normal.   Rest left ventricular function is abnormal. Rest EF: 30%. Stress left ventricular function is  normal. Stress EF: 54%. End diastolic cavity size is normal. End systolic cavity size is normal.   Myocardial blood flow was computed to be 1.80ml/g/min at rest and 2.16ml/g/min at stress. Global myocardial blood flow reserve was 2.16 and was normal.   Coronary calcium  was present on the  attenuation correction CT images. Moderate coronary calcifications were present. Coronary calcifications were present in the left anterior descending artery and right coronary artery distribution(s). Aortic atherosclerosis. Pulmonary artery dilation (non contrast study).   Electronically Signed  By: Stanly Leavens M.D.  CLINICAL DATA:  This over-read does not include interpretation of cardiac or coronary anatomy or pathology. The interpretation by the cardiologist is attached.  COMPARISON:  None Available.  FINDINGS: Scout view is grossly unremarkable. Atherosclerotic calcification of the aorta. Enlarged right and left pulmonary arteries. Minimal bibasilar scarring/atelectasis. No acute extracardiac findings.  IMPRESSION: 1. No acute extracardiac findings. 2.  Aortic atherosclerosis (ICD10-I70.0). 3. Enlarged right and left pulmonary arteries, indicative of pulmonary arterial hypertension.   Electronically Signed By: Newell Eke M.D. On: 01/26/2024 14:26   ECHOCARDIOGRAM  ECHOCARDIOGRAM COMPLETE 01/02/2023  Narrative ECHOCARDIOGRAM REPORT    Patient Name:   [name] [name] Date of Exam: 01/02/2023 Medical Rec #:  [id number]        Height:       62.0 in Accession #:    [id number]       Weight:       119.9 lb Date of Birth:  [birth year]        BSA:          1.538 m Patient Age:    89 years         BP:           141/77 mmHg Patient Gender: F                HR:           50 bpm. Exam Location:  Inpatient  Procedure: 2D Echo, Cardiac Doppler and Color Doppler  Indications:    Abnormal ECG R94.31 Cardiomegaly I51.7  History:        Patient has no prior history of Echocardiogram examinations. Risk Factors:Hypertension and Dyslipidemia.  Sonographer:    Tillman Nora RVT RCS Referring Phys: [id number] DAVID MANUEL ORTIZ   Sonographer Comments: Image acquisition challenging due to respiratory motion. IMPRESSIONS   1. Left ventricular ejection fraction, by estimation,  is 60 to 65%. The left ventricle has normal function. The left ventricle has no regional wall motion abnormalities. Left ventricular diastolic parameters are consistent with Grade I diastolic dysfunction (impaired relaxation). 2. Right ventricular systolic function is normal. The right ventricular size is normal. There is normal pulmonary artery systolic pressure. The estimated right ventricular systolic pressure is 11.9 mmHg. 3. Left atrial size was severely dilated. 4. Right atrial size was severely dilated. 5. The mitral valve is normal in structure. Mild mitral valve regurgitation. No evidence of mitral stenosis. 6. The aortic valve is tricuspid. Aortic valve regurgitation is not visualized. No aortic stenosis is present. 7. The inferior vena cava is normal in size with greater than 50% respiratory variability, suggesting right atrial pressure of 3 mmHg.  FINDINGS Left Ventricle: Left ventricular ejection fraction, by estimation, is 60 to 65%. The left ventricle has normal function. The left ventricle has no regional wall motion abnormalities. The left ventricular internal cavity size was normal in size. There is no left ventricular hypertrophy. Left ventricular diastolic parameters are consistent with Grade I diastolic dysfunction (  impaired relaxation).  Right Ventricle: The right ventricular size is normal. No increase in right ventricular wall thickness. Right ventricular systolic function is normal. There is normal pulmonary artery systolic pressure. The tricuspid regurgitant velocity is 1.49 m/s, and with an assumed right atrial pressure of 3 mmHg, the estimated right ventricular systolic pressure is 11.9 mmHg.  Left Atrium: Left atrial size was severely dilated.  Right Atrium: Right atrial size was severely dilated.  Pericardium: There is no evidence of pericardial effusion.  Mitral Valve: The mitral valve is normal in structure. Mild mitral valve regurgitation. No evidence of mitral  valve stenosis.  Tricuspid Valve: The tricuspid valve is normal in structure. Tricuspid valve regurgitation is mild . No evidence of tricuspid stenosis.  Aortic Valve: The aortic valve is tricuspid. Aortic valve regurgitation is not visualized. No aortic stenosis is present. Aortic valve mean gradient measures 4.5 mmHg. Aortic valve peak gradient measures 8.7 mmHg. Aortic valve area, by VTI measures 1.86 cm.  Pulmonic Valve: The pulmonic valve was normal in structure. Pulmonic valve regurgitation is not visualized. No evidence of pulmonic stenosis.  Aorta: The aortic root is normal in size and structure.  Venous: The inferior vena cava is normal in size with greater than 50% respiratory variability, suggesting right atrial pressure of 3 mmHg.  IAS/Shunts: No atrial level shunt detected by color flow Doppler.   LEFT VENTRICLE PLAX 2D LVIDd:         4.40 cm   Diastology LVIDs:         3.10 cm   LV e' medial:  10.30 cm/s LV PW:         0.80 cm   LV e' lateral: 11.40 cm/s LV IVS:        1.00 cm LVOT diam:     1.70 cm LV SV:         58 LV SV Index:   38 LVOT Area:     2.27 cm   RIGHT VENTRICLE          IVC RV Basal diam:  3.90 cm  IVC diam: 1.90 cm  LEFT ATRIUM              Index        RIGHT ATRIUM           Index LA diam:        2.60 cm  1.69 cm/m   RA Area:     21.50 cm LA Vol (A2C):   107.0 ml 69.56 ml/m  RA Volume:   68.20 ml  44.34 ml/m LA Vol (A4C):   108.0 ml 70.21 ml/m LA Biplane Vol: 110.0 ml 71.51 ml/m AORTIC VALVE                     PULMONIC VALVE AV Area (Vmax):    1.95 cm      PV Vmax:       0.94 m/s AV Area (Vmean):   1.92 cm      PV Peak grad:  3.5 mmHg AV Area (VTI):     1.86 cm AV Vmax:           147.50 cm/s AV Vmean:          101.150 cm/s AV VTI:            0.311 m AV Peak Grad:      8.7 mmHg AV Mean Grad:      4.5 mmHg LVOT Vmax:  127.00 cm/s LVOT Vmean:        85.750 cm/s LVOT VTI:          0.255 m LVOT/AV VTI ratio: 0.82  AORTA Ao  Root diam: 2.70 cm Ao Asc diam:  3.50 cm  TRICUSPID VALVE TR Peak grad:   8.9 mmHg TR Vmax:        149.00 cm/s  SHUNTS Systemic VTI:  0.26 m Systemic Diam: 1.70 cm  Oneil Parchment MD Electronically signed by Oneil Parchment MD Signature Date/Time: 01/02/2023/12:53:58 PM    Final    MONITORS  LONG TERM MONITOR (3-14 DAYS) 04/07/2023  Narrative   Predominately sinus rhythm   14 episodes of nonsustained VT .   the fastest interval lasted 4 beats at max HR of 156.  longest episode was 4 beats with HR 106   71 episodes of SVT .  the longest episode lasted 39.8 seconds with avg HR of 139.   the fastest episode was 5 beats at max HR of 156.   Frequent PVCs.  PVC burden was 24.4%.   Patch Wear Time:  13 days and 23 hours (2024-08-14T15:27:47-0400 to 2024-08-28T15:27:43-0400)  Patient had a min HR of 50 bpm, max HR of 156 bpm, and avg HR of 69 bpm. Predominant underlying rhythm was Sinus Rhythm. 14 Ventricular Tachycardia runs occurred, the run with the fastest interval lasting 4 beats with a max rate of 156 bpm, the longest lasting 4 beats with an avg rate of 106 bpm. 71 Supraventricular Tachycardia runs occurred, the run with the fastest interval lasting 5 beats with a max rate of 156 bpm, the longest lasting 39.8 secs with an avg rate of 139 bpm. Isolated SVEs were rare (<1.0%), SVE Couplets were rare (<1.0%), and SVE Triplets were rare (<1.0%). Some episodes of Supraventricular Tachycardia conducted with possible aberrancy. Some episodes of Supraventricular Tachycardia may be possible Atrial Tachycardia with variable block. Isolated VEs were frequent (24.4%, [id number]), VE Couplets were occasional (3.1%, 19965), and VE Triplets were rare (<1.0%, 56). Ventricular Bigeminy and Trigeminy were present.       ______________________________________________________________________________________________       Current Reported Medications:.    Current Meds  Medication Sig   amLODipine  (NORVASC )  2.5 MG tablet Take 1 tablet (2.5 mg total) by mouth daily.   Ascorbic Acid  (VITAMIN C ) 500 MG tablet Take 500 mg by mouth daily.   aspirin  EC 81 MG tablet Take 1 tablet (81 mg total) by mouth 2 (two) times daily. Swallow whole.   Calcium  Carb-Cholecalciferol  600-800 MG-UNIT TABS Take 2 tablets by mouth daily.   MELATONIN PO Take 1 tablet by mouth as needed (sleep).   metoprolol  succinate (TOPROL  XL) 25 MG 24 hr tablet Take 1 tablet (25 mg total) by mouth daily.   Multiple Vitamins-Minerals (CENTRUM SILVER PO) Take 1 tablet by mouth daily.    Physical Exam:    VS:  BP 124/64   Pulse 66   Ht 5' 2 (1.575 m)   Wt 110 lb (49.9 kg)   SpO2 95%   BMI 20.12 kg/m    Wt Readings from Last 3 Encounters:  05/16/24 110 lb (49.9 kg)  03/30/24 111 lb 6.4 oz (50.5 kg)  01/06/24 111 lb 0.6 oz (50.4 kg)    GEN: Well nourished, well developed in no acute distress NECK: No JVD; No carotid bruits CARDIAC: RRR, no murmurs, rubs, gallops RESPIRATORY:  Clear to auscultation without rales, wheezing or rhonchi  ABDOMEN: Soft, non-tender, non-distended EXTREMITIES:  No edema;  No acute deformity     Asessement and Plan:.    DOE/mild mitral valve regurgitation: Echo on 01/02/2023 indicated LVEF 60 to 65%, no RWMA, G1 DD, RV systolic function and size is normal, severely dilated atria, mild mitral valve regurgitation with no evidence of stenosis. Patient reports that she becomes increasing short of breath when she is out walking at [place], reports that she will walk from the parking lot and around [place] and become short winded and feel increasingly fatigued.  She denies any chest pain, palpitations, presyncope or syncope.  She denies any increased lower extremity edema, orthopnea or PND.  She feels that her shortness of breath has become progressively worse in recent months.  Given high PVC burden on monitor last year will repeat 3-day monitor as noted below, will also check echocardiogram given history of mild  mitral valve regurgitation.  Reviewed ED precautions. Check BMET and BNP.   Frequent PVCs: Patient with 24% burden of PVCs on Zio patch in August 2024.  At last office visit was started on metoprolol  succinate 25 mg daily.  Today she reports that she has been taking metoprolol  however her PCP recently noted that she was not taking the medication, patient seems unsure if she has been consistently taking.  Patient denies any significant palpitations or feeling of increased heart rates or skipped beats.  Encouraged patient to take metoprolol  succinate 25 mg daily, will repeat 3-day cardiac monitor to reassess PVC burden  HTN: Blood pressure today 124/64.  Continue amlodipine  2.5 mg daily and metoprolol  succinate 25 mg daily.   Disposition: F/u with Dr. Liborio or Tobi Leinweber, NP in 8 weeks or sooner if needed.   Signed, Cora Brierley D Neely Cecena, NP

## 2024-05-16 ENCOUNTER — Encounter: Payer: Self-pay | Admitting: Cardiology

## 2024-05-16 ENCOUNTER — Ambulatory Visit: Attending: Cardiology | Admitting: Cardiology

## 2024-05-16 ENCOUNTER — Ambulatory Visit (INDEPENDENT_AMBULATORY_CARE_PROVIDER_SITE_OTHER)

## 2024-05-16 VITALS — BP 124/64 | HR 66 | Ht 62.0 in | Wt 110.0 lb

## 2024-05-16 DIAGNOSIS — I340 Nonrheumatic mitral (valve) insufficiency: Secondary | ICD-10-CM | POA: Diagnosis not present

## 2024-05-16 DIAGNOSIS — R0609 Other forms of dyspnea: Secondary | ICD-10-CM

## 2024-05-16 DIAGNOSIS — I493 Ventricular premature depolarization: Secondary | ICD-10-CM

## 2024-05-16 DIAGNOSIS — I10 Essential (primary) hypertension: Secondary | ICD-10-CM

## 2024-05-16 DIAGNOSIS — R0602 Shortness of breath: Secondary | ICD-10-CM

## 2024-05-16 NOTE — Progress Notes (Unsigned)
 Enrolled for [employer] to mail a ZIO XT long term holter monitor to the patients address on file.   ZIO serial # [id number] mailed to patient and applied in office.  Dr. GORMAN. Madireddy to read.

## 2024-05-16 NOTE — Patient Instructions (Addendum)
 Medication Instructions:  Your physician recommends that you continue on your current medications as directed. Please refer to the Current Medication list given to you today.  *If you need a refill on your cardiac medications before your next appointment, please call your pharmacy*  Lab Work: Today- BMET, BNP If you have labs (blood work) drawn today and your tests are completely normal, you will receive your results only by: MyChart Message (if you have MyChart) OR A paper copy in the mail If you have any lab test that is abnormal or we need to change your treatment, we will call you to review the results.  Testing/Procedures:  GEOFFRY HEWS- Long Term Monitor Instructions  Your physician has requested you wear a ZIO patch monitor for 3 days.  This is a single patch monitor. [employer] supplies one patch monitor per enrollment. Additional stickers are not available. Please do not apply patch if you will be having a Nuclear Stress Test,  Echocardiogram, Cardiac CT, MRI, or Chest Xray during the period you would be wearing the  monitor. The patch cannot be worn during these tests. You cannot remove and re-apply the  ZIO XT patch monitor.  Your ZIO patch monitor will be mailed 3 day USPS to your address on file. It may take 3-5 days  to receive your monitor after you have been enrolled.  Once you have received your monitor, please review the enclosed instructions. Your monitor  has already been registered assigning a specific monitor serial # to you.  Billing and Patient Assistance Program Information  We have supplied [employer] with any of your insurance information on file for billing purposes. [employer] offers a sliding scale Patient Assistance Program for patients that do not have  insurance, or whose insurance does not completely cover the cost of the ZIO monitor.  You must apply for the Patient Assistance Program to qualify for this discounted rate.  To apply, please call [employer] at  [phone], select option 4, select option 2, ask to apply for  Patient Assistance Program. Meredeth will ask your household income, and how many people  are in your household. They will quote your out-of-pocket cost based on that information.  [employer] will also be able to set up a 50-month, interest-free payment plan if needed.  Applying the monitor   Shave hair from upper left chest.  Hold abrader disc by orange tab. Rub abrader in 40 strokes over the upper left chest as  indicated in your monitor instructions.  Clean area with 4 enclosed alcohol  pads. Let dry.  Apply patch as indicated in monitor instructions. Patch will be placed under collarbone on left  side of chest with arrow pointing upward.  Rub patch adhesive wings for 2 minutes. Remove white label marked 1. Remove the white  label marked 2. Rub patch adhesive wings for 2 additional minutes.  While looking in a mirror, press and release button in center of patch. A small green light will  flash 3-4 times. This will be your only indicator that the monitor has been turned on.  Do not shower for the first 24 hours. You may shower after the first 24 hours.  Press the button if you feel a symptom. You will hear a small click. Record Date, Time and  Symptom in the Patient Logbook.  When you are ready to remove the patch, follow instructions on the last 2 pages of Patient  Logbook. Stick patch monitor onto the last page of Patient Logbook.  Place Patient Logbook  in the blue and white box. Use locking tab on box and tape box closed  securely. The blue and white box has prepaid postage on it. Please place it in the mailbox as  soon as possible. Your physician should have your test results approximately 7 days after the  monitor has been mailed back to [place].  Call [facility] Customer Care at [phone] if you have questions regarding  your ZIO XT patch monitor. Call them immediately if you see an orange light  blinking on your  monitor.  If your monitor falls off in less than 4 days, contact our Monitor department at [phone].  If your monitor becomes loose or falls off after 4 days call [employer] at [phone] for  suggestions on securing your monitor      Your physician has requested that you have an echocardiogram. Echocardiography is a painless test that uses sound waves to create images of your heart. It provides your doctor with information about the size and shape of your heart and how well your heart's chambers and valves are working. This procedure takes approximately one hour. There are no restrictions for this procedure. Please do NOT wear cologne, perfume, aftershave, or lotions (deodorant is allowed). Please arrive 15 minutes prior to your appointment time.  Please note: We ask at that you not bring children with you during ultrasound (echo/ vascular) testing. Due to room size and safety concerns, children are not allowed in the ultrasound rooms during exams. Our front office staff cannot provide observation of children in our lobby area while testing is being conducted. An adult accompanying a patient to their appointment will only be allowed in the ultrasound room at the discretion of the ultrasound technician under special circumstances. We apologize for any inconvenience.   Follow-Up: At [facility], you and your health needs are our priority.  As part of our continuing mission to provide you with exceptional heart care, our providers are all part of one team.  This team includes your primary Cardiologist (physician) and Advanced Practice Providers or APPs (Physician Assistants and Nurse Practitioners) who all work together to provide you with the care you need, when you need it.  Your next appointment:   2-68month(s)  Provider:   Alean Kobus, MD

## 2024-05-17 ENCOUNTER — Ambulatory Visit: Payer: Self-pay | Admitting: Cardiology

## 2024-05-17 DIAGNOSIS — Z79899 Other long term (current) drug therapy: Secondary | ICD-10-CM

## 2024-05-17 LAB — BASIC METABOLIC PANEL WITH GFR
BUN/Creatinine Ratio: 25 (ref 12–28)
BUN: 27 mg/dL (ref 10–36)
CO2: 21 mmol/L (ref 20–29)
Calcium: 9.9 mg/dL (ref 8.7–10.3)
Chloride: 105 mmol/L (ref 96–106)
Creatinine, Ser: 1.07 mg/dL — ABNORMAL HIGH (ref 0.57–1.00)
Glucose: 99 mg/dL (ref 70–99)
Potassium: 5.2 mmol/L (ref 3.5–5.2)
Sodium: 143 mmol/L (ref 134–144)
eGFR: 49 mL/min/1.73 — ABNORMAL LOW (ref >59)

## 2024-05-17 LAB — BRAIN NATRIURETIC PEPTIDE: BNP: 361.8 pg/mL — ABNORMAL HIGH (ref 0.0–100.0)

## 2024-05-17 MED ORDER — FUROSEMIDE 20 MG PO TABS: 20.0000 mg | ORAL_TABLET | Freq: Every day | ORAL | 3 refills | Status: AC | PRN

## 2024-05-17 NOTE — Addendum Note (Signed)
 Addended by: RUBBIE KERRI HERO on: 05/17/2024 04:58 PM   Modules accepted: Orders

## 2024-05-17 NOTE — Telephone Encounter (Signed)
 Lvm on son vm to call clinic back with results and recommendations.

## 2024-05-17 NOTE — Telephone Encounter (Signed)
-----   Message from Katlyn D West sent at 05/17/2024  4:12 PM EDT ----- Please let [name] [name] know that her kidney function is stable and her electrolytes are normal. Her BNP, a maker that indicates if she is holding extra fluid is mildly elevated. Recommend starting  lasix 20 mg daily for three days, followed by lasix 20 mg daily as needed for increased lower extremity edema. Lets recheck a BMET in one week. Thank you!  ----- Message ----- From: Interface, [facility] Lab Results In Sent: 05/17/2024   6:38 AM EDT To: Katlyn D West, NP

## 2024-05-22 ENCOUNTER — Telehealth: Payer: Self-pay

## 2024-05-22 NOTE — Telephone Encounter (Signed)
 Pt received a monitor in the mail and states she doesn't know how to apply it. Pt would like a c/b to give further instructions. Please Advise

## 2024-05-22 NOTE — Telephone Encounter (Signed)
 Can one of you please help with this?

## 2024-05-23 NOTE — Telephone Encounter (Signed)
 LMVM Returning call to schedule an appointment to apply a ZIO XT monitor.  Please call Rico or Rockie in monitors at [phone].

## 2024-05-24 ENCOUNTER — Telehealth: Payer: Self-pay | Admitting: Internal Medicine

## 2024-05-24 DIAGNOSIS — Z79899 Other long term (current) drug therapy: Secondary | ICD-10-CM | POA: Diagnosis not present

## 2024-05-24 NOTE — Telephone Encounter (Signed)
 Pt came to [place] office  I looked in chart and told patient to call cardiologist and make an appointment for them to put on Zio XT Monitor  . Pt stated she is 89 yrs old and did not want to go to cardiologist office

## 2024-05-25 ENCOUNTER — Telehealth: Payer: Self-pay | Admitting: *Deleted

## 2024-05-25 ENCOUNTER — Ambulatory Visit: Attending: Cardiovascular Disease

## 2024-05-25 LAB — BASIC METABOLIC PANEL WITH GFR
BUN/Creatinine Ratio: 29 — ABNORMAL HIGH (ref 12–28)
BUN: 28 mg/dL (ref 10–36)
CO2: 24 mmol/L (ref 20–29)
Calcium: 9.2 mg/dL (ref 8.7–10.3)
Chloride: 103 mmol/L (ref 96–106)
Creatinine, Ser: 0.98 mg/dL (ref 0.57–1.00)
Glucose: 127 mg/dL — ABNORMAL HIGH (ref 70–99)
Potassium: 4.4 mmol/L (ref 3.5–5.2)
Sodium: 141 mmol/L (ref 134–144)
eGFR: 55 mL/min/1.73 — ABNORMAL LOW (ref >59)

## 2024-05-25 NOTE — Telephone Encounter (Signed)
 Appointment to apply monitor today at 3:30.

## 2024-06-02 DIAGNOSIS — R0609 Other forms of dyspnea: Secondary | ICD-10-CM | POA: Diagnosis not present

## 2024-06-02 DIAGNOSIS — I493 Ventricular premature depolarization: Secondary | ICD-10-CM | POA: Diagnosis not present

## 2024-06-06 DIAGNOSIS — H5203 Hypermetropia, bilateral: Secondary | ICD-10-CM | POA: Diagnosis not present

## 2024-06-06 DIAGNOSIS — H43393 Other vitreous opacities, bilateral: Secondary | ICD-10-CM | POA: Diagnosis not present

## 2024-06-16 ENCOUNTER — Ambulatory Visit (HOSPITAL_COMMUNITY)
Admission: RE | Admit: 2024-06-16 | Discharge: 2024-06-16 | Disposition: A | Discharge status: Home / Self Care | Source: Ambulatory Visit | Attending: Cardiology | Admitting: Cardiology

## 2024-06-16 DIAGNOSIS — I340 Nonrheumatic mitral (valve) insufficiency: Secondary | ICD-10-CM | POA: Diagnosis not present

## 2024-06-16 DIAGNOSIS — R0602 Shortness of breath: Secondary | ICD-10-CM

## 2024-06-20 LAB — ECHOCARDIOGRAM COMPLETE
AR max vel: 1.67 cm2
AV Area VTI: 1.63 cm2
AV Area mean vel: 1.64 cm2
AV Mean grad: 3 mmHg
AV Peak grad: 6.1 mmHg
Ao pk vel: 1.23 m/s
Area-P 1/2: 3.03 cm2
S' Lateral: 3.36 cm

## 2024-06-21 NOTE — Progress Notes (Signed)
 Spoke with son of pt regarding echo results

## 2024-06-28 DIAGNOSIS — R0602 Shortness of breath: Secondary | ICD-10-CM

## 2024-06-28 DIAGNOSIS — R0609 Other forms of dyspnea: Secondary | ICD-10-CM

## 2024-06-28 DIAGNOSIS — I493 Ventricular premature depolarization: Secondary | ICD-10-CM

## 2024-06-28 DIAGNOSIS — I340 Nonrheumatic mitral (valve) insufficiency: Secondary | ICD-10-CM

## 2024-06-28 DIAGNOSIS — I10 Essential (primary) hypertension: Secondary | ICD-10-CM

## 2024-07-12 NOTE — Telephone Encounter (Signed)
 S/W patients son and relayed results. Son voiced understanding.

## 2024-07-12 NOTE — Telephone Encounter (Signed)
 Left message for Son to call the office for results for his mother.

## 2024-08-07 NOTE — Progress Notes (Signed)
 " Cardiology Office Note    Date:  08/10/2024  ID:  [name] [name], DOB 07/31/1932, MRN [id number] PCP:  [name] Apolinar POUR, MD  Cardiologist:  [name] SAUNDERS Madireddy, MD  Electrophysiologist:  None   Chief Complaint: Follow up for shortness of breath  History of Present Illness: [name]   [name] is a 89 y.o. female with visit-pertinent history of frequent PVCs with 24% burden on cardiac monitor in 03/2023, short runs of NSVT, hypertension, breast cancer s/p right lumpectomy, s/p left mastectomy in 1970s, mechanical fall resulting in right hip fracture in May 2024 s/p surgical repair.   Patient first seen by Dr. Liborio on 01/06/2024 for evaluation of increased shortness of breath, noted to have been more noticeable over the past few months with walking and any exertion at home.  Reported she had not been overall bothered by palpitations.  Nuclear stress test was ordered for further evaluation.   Nuclear stress test on 01/26/2024 was negative for ischemia, study was noted to be intermediate risk as the rest EF noted to be 30%, stress EF 54% it was felt that abnormal resting LVEF was likely artifactual related to PVCs and pattern of bigeminy.  Moderate coronary calcifications were present, pulmonary artery dilation was noted.   Patient was seen by her PCP on 03/30/2024 noted ongoing shortness of breath, BNP was elevated at 534.  Patient was last seen in clinic on 05/16/2024.  She reported she been doing well overall, continues to note some shortness of breath with prolonged exertion.  Patient would walk for an hour in [place] and then feel fatigued and feel short of breath and struggle to walk back out to her car.  She denied any significant palpitations or feeling as though her heart was racing.  Echocardiogram and cardiac monitor were ordered for further evaluation.  Echocardiogram on 06/16/2024 indicated LVEF 55 to 60%, no RWMA, G1 DD, RV systolic function and size was normal, normal PASP, LA was  moderately dilated, mild mitral valve regurgitation with no evidence of mitral stenosis, mild calcification of the aortic valve without evidence of stenosis.  Cardiac monitor worn for 3 days and 23 hours indicated predominant rhythm was sinus rhythm with an average heart rate of 64 bpm ranging from 37 to 87 bpm, frequent PVCs at 23.6%, rare supraventricular ectopy burden less than 1%.  PVC burden similar to prior monitor in August 2024.  Today she presents for follow-up.  She reports that she has been doing well overall. She notes that her biggest concerns are over guarding her problems with urinary incontinence, notes that she has to get up frequently the melanite to go to the bathroom.  She reports this is been an ongoing problem for many years.  She denies any chest pain, reports that her shortness of breath has significantly improved, denies any increased lower extremity edema, orthopnea or PND.  Patient reports that she has been consistently taking her metoprolol  and has noted improvement in her palpitations, reports that she has not felt any recently.  She denies any dizziness, lightheadedness, presyncope or syncope.  She denies any cardiac concerns or complaints at this time, patient notes that she would not be interested in any invasive surgeries or procedures given her age. ROS: .   Today she denies chest pain, shortness of breath, lower extremity edema, fatigue, palpitations, melena, hematuria, hemoptysis, diaphoresis, weakness, presyncope, syncope, orthopnea, and PND.  All other systems are reviewed and otherwise negative. Studies Reviewed: [name]   EKG:  EKG  is not ordered today.  CV Studies: Cardiac studies reviewed are outlined and summarized above. Otherwise please see EMR for full report. Cardiac Studies & Procedures   ______________________________________________________________________________________________   STRESS TESTS  NM PET CT CARDIAC PERFUSION MULTI W/ABSOLUTE BLOODFLOW  01/26/2024  Narrative   The study is negative for ischemia.  Abnormal reste LVEF is likely artifactual related to PVCs in the pattern of bigeminy. The study is low risk save for PVCs burden.   The study is intermediate risk.   LV perfusion is normal.   Rest left ventricular function is abnormal. Rest EF: 30%. Stress left ventricular function is normal. Stress EF: 54%. End diastolic cavity size is normal. End systolic cavity size is normal.   Myocardial blood flow was computed to be 1.80ml/g/min at rest and 2.16ml/g/min at stress. Global myocardial blood flow reserve was 2.16 and was normal.   Coronary calcium  was present on the attenuation correction CT images. Moderate coronary calcifications were present. Coronary calcifications were present in the left anterior descending artery and right coronary artery distribution(s). Aortic atherosclerosis. Pulmonary artery dilation (non contrast study).   Electronically Signed  By: [name] [name] M.D.  CLINICAL DATA:  This over-read does not include interpretation of cardiac or coronary anatomy or pathology. The interpretation by the cardiologist is attached.  COMPARISON:  None Available.  FINDINGS: Scout view is grossly unremarkable. Atherosclerotic calcification of the aorta. Enlarged right and left pulmonary arteries. Minimal bibasilar scarring/atelectasis. No acute extracardiac findings.  IMPRESSION: 1. No acute extracardiac findings. 2.  Aortic atherosclerosis (ICD10-I70.0). 3. Enlarged right and left pulmonary arteries, indicative of pulmonary arterial hypertension.   Electronically Signed By: [name] [name] M.D. On: 01/26/2024 14:26   ECHOCARDIOGRAM  ECHOCARDIOGRAM COMPLETE 06/16/2024  Narrative ECHOCARDIOGRAM REPORT    Patient Name:   [name] [name] Date of Exam: 06/16/2024 Medical Rec #:  [id number]        Height:       62.0 in Accession #:    [id number]       Weight:       110.0 lb Date of Birth:  [birth year]         BSA:          1.483 m Patient Age:    89 years         BP:           124/64 mmHg Patient Gender: F                HR:           67 bpm. Exam Location:  [address]  Procedure: 2D Echo, Cardiac Doppler and Color Doppler (Both Spectral and Color Flow Doppler were utilized during procedure).  Indications:    SOB (shortness of breath) [R06.02 (ICD-10-CM)]; Mitral valve insufficiency, unspecified etiology [I34.0 (ICD-10-CM)]  History:        Patient has prior history of Echocardiogram examinations, most recent 01/02/2023. Risk Factors:Hypertension, Dyslipidemia and Former Smoker.  Sonographer:    Rosaline Fujisawa MHA, RDMS, RVT, RDCS Referring Phys: [id number] [facility] D Donnalee Cellucci  IMPRESSIONS   1. Rhythm strip during this exam demonstrates frequent premature ventricular contractions. 2. Left ventricular ejection fraction, by estimation, is 55 to 60%. The left ventricle has normal function. The left ventricle has no regional wall motion abnormalities. Left ventricular diastolic parameters are consistent with Grade I diastolic dysfunction (impaired relaxation). 3. Right ventricular systolic function is normal. The right ventricular size is normal. There is normal pulmonary artery systolic pressure. The estimated right  ventricular systolic pressure is 14.8 mmHg. 4. Left atrial size was moderately dilated. 5. The mitral valve is grossly normal. Mild mitral valve regurgitation. No evidence of mitral stenosis. 6. The aortic valve is grossly normal. There is mild calcification of the aortic valve. There is mild thickening of the aortic valve. Aortic valve regurgitation is not visualized. Aortic valve sclerosis/calcification is present, without any evidence of aortic stenosis. 7. The inferior vena cava is normal in size with greater than 50% respiratory variability, suggesting right atrial pressure of 3 mmHg.  FINDINGS Left Ventricle: Left ventricular ejection fraction, by estimation, is 55 to 60%.  The left ventricle has normal function. The left ventricle has no regional wall motion abnormalities. The left ventricular internal cavity size was normal in size. There is no left ventricular hypertrophy. Left ventricular diastolic parameters are consistent with Grade I diastolic dysfunction (impaired relaxation).  Right Ventricle: The right ventricular size is normal. No increase in right ventricular wall thickness. Right ventricular systolic function is normal. There is normal pulmonary artery systolic pressure. The tricuspid regurgitant velocity is 1.72 m/s, and with an assumed right atrial pressure of 3 mmHg, the estimated right ventricular systolic pressure is 14.8 mmHg.  Left Atrium: Left atrial size was moderately dilated.  Right Atrium: Right atrial size was normal in size. Prominent Eustachian valve.  Pericardium: There is no evidence of pericardial effusion.  Mitral Valve: The mitral valve is grossly normal. Mild mitral annular calcification. Mild mitral valve regurgitation. No evidence of mitral valve stenosis.  Tricuspid Valve: The tricuspid valve is normal in structure. Tricuspid valve regurgitation is mild . No evidence of tricuspid stenosis.  Aortic Valve: The aortic valve is grossly normal. There is mild calcification of the aortic valve. There is mild thickening of the aortic valve. Aortic valve regurgitation is not visualized. Aortic valve sclerosis/calcification is present, without any evidence of aortic stenosis. Aortic valve mean gradient measures 3.0 mmHg. Aortic valve peak gradient measures 6.1 mmHg. Aortic valve area, by VTI measures 1.63 cm.  Pulmonic Valve: The pulmonic valve was normal in structure. Pulmonic valve regurgitation is trivial. No evidence of pulmonic stenosis.  Aorta: The aortic root is normal in size and structure.  Venous: The inferior vena cava is normal in size with greater than 50% respiratory variability, suggesting right atrial pressure of 3  mmHg.  IAS/Shunts: No atrial level shunt detected by color flow Doppler.  EKG: Rhythm strip during this exam demonstrates premature ventricular contractions.   LEFT VENTRICLE PLAX 2D LVIDd:         4.52 cm   Diastology LVIDs:         3.36 cm   LV e' medial:    5.76 cm/s LV PW:         0.81 cm   LV E/e' medial:  9.4 LV IVS:        0.81 cm   LV e' lateral:   8.03 cm/s LVOT diam:     1.82 cm   LV E/e' lateral: 6.7 LV SV:         52 LV SV Index:   35 LVOT Area:     2.60 cm LV IVRT:       81 msec   RIGHT VENTRICLE             IVC RV Basal diam:  3.02 cm     IVC diam: 1.46 cm RV Mid diam:    2.79 cm RV S prime:     13.40 cm/s TAPSE (M-mode):  2.8 cm  LEFT ATRIUM             Index        RIGHT ATRIUM           Index LA diam:        3.58 cm 2.41 cm/m   RA Area:     17.60 cm LA Vol (A2C):   42.5 ml 28.66 ml/m  RA Volume:   43.10 ml  29.07 ml/m LA Vol (A4C):   58.7 ml 39.59 ml/m LA Biplane Vol: 50.6 ml 34.13 ml/m AORTIC VALVE AV Area (Vmax):    1.67 cm AV Area (Vmean):   1.64 cm AV Area (VTI):     1.63 cm AV Vmax:           123.00 cm/s AV Vmean:          83.200 cm/s AV VTI:            0.318 m AV Peak Grad:      6.1 mmHg AV Mean Grad:      3.0 mmHg LVOT Vmax:         79.00 cm/s LVOT Vmean:        52.600 cm/s LVOT VTI:          0.199 m LVOT/AV VTI ratio: 0.63  AORTA Ao Root diam: 2.83 cm Ao Asc diam:  3.06 cm  MITRAL VALVE               TRICUSPID VALVE MV Area (PHT): 3.03 cm    TR Peak grad:   11.8 mmHg MV Decel Time: 250 msec    TR Vmax:        172.00 cm/s MV E velocity: 54.00 cm/s MV A velocity: 67.60 cm/s  SHUNTS MV E/A ratio:  0.80        Systemic VTI:  0.20 m Systemic Diam: 1.82 cm  Soyla Merck MD Electronically signed by Soyla Merck MD Signature Date/Time: 06/20/2024/8:59:22 AM    Final    MONITORS  LONG TERM MONITOR (3-14 DAYS) 06/05/2024  Narrative   Predominantly sinus rhythm, average heart rate 64/min [ranging from 37 to 87 bpm].    Frequent ventricular ectopy burden 23.6%.   Rare supraventricular ectopy burden, less than 1% with 8 short runs of supraventricular tachycardia noted with the longest episode lasting 14 beats.   No high-grade AV block, pauses or sustained arrhythmia.   No patient triggered events or diary entries.   In comparison  prior heart monitor study from August 2024 report short episodes of nonsustained ventricular tachycardia and supraventricular tachycardia episodes, PVC burden was similar at 24%.   Patch Wear Time:  3 days and 23 hours (2025-10-23T15:35:10-398 to 2025-10-27T15:33:15-0400)  Patient had a min HR of 37 bpm, max HR of 172 bpm, and avg HR of 66 bpm. Predominant underlying rhythm was Sinus Rhythm. 8 Supraventricular Tachycardia runs occurred, the run with the fastest interval lasting 5 beats with a max rate of 141 bpm, the longest lasting 14 beats with an avg rate of 116 bpm. Isolated SVEs were rare (<1.0%), SVE Couplets were rare (<1.0%), and no SVE Triplets were present. Isolated VEs were frequent (23.6%, [id number]), VE Couplets were occasional (1.2%, 1986), and no VE Triplets were present. Ventricular Bigeminy and Trigeminy were present.       ______________________________________________________________________________________________       Current Reported Medications:.    Active Medications[1]  Physical Exam:    VS:  BP 120/68   Pulse 66   Ht  5' 2 (1.575 m)   Wt 118 lb (53.5 kg)   SpO2 96%   BMI 21.58 kg/m    Wt Readings from Last 3 Encounters:  08/10/24 118 lb (53.5 kg)  05/16/24 110 lb (49.9 kg)  03/30/24 111 lb 6.4 oz (50.5 kg)    GEN: Well nourished, well developed in no acute distress NECK: No JVD; No carotid bruits CARDIAC: RRR, no murmurs, rubs, gallops RESPIRATORY:  Clear to auscultation without rales, wheezing or rhonchi  ABDOMEN: Soft, non-tender, non-distended EXTREMITIES:  No edema; No acute deformity     Asessement and Plan:.    DOE/mitral valve  regurgitation: Echo in 11/25 medicated LVEF 55 to 60%, no RWMA, mild mitral valve regurgitation with no evidence of stenosis. Today she reports that her shortness of breath has significantly improved, she is regularly walking around [place] and tolerating well.  She denies any increased lower extremity edema, orthopnea or PND.  Reviewed ED precautions.  Continue Lasix  as needed.  Frequent PVCs: Patient with 24% burden of PVCs on Zio patch in August 2024, started on metoprolol  succinate 25 mg daily.  Monitor in 06/2024 indicated PVC burden of 23.6%, predominant rhythm normal sinus rhythm with average heart rate 64 bpm. Today she reports that she has been taking her metoprolol  more consistently, denies any palpitations or feelings of irregular heart beats. Patient reports that she has been doing very well and has resumed her normal activity, patient declined and further testing or intervention at this time.  Discussed with patient importance of continuing metoprolol  daily, patient reports that if she starts to feel palpitations again she will let the office know.  Reviewed ED precautions.  HTN: Blood pressure today 120/68, continue amlodipine  2.5 mg daily, Lasix  as needed, metoprolol  succinate 25 mg daily.   Disposition: F/u with Dr. Liborio in six months or sooner if needed per patient request.   Signed, Yuan Gann D Kein Carlberg, NP       [1]  Current Meds  Medication Sig   amLODipine  (NORVASC ) 2.5 MG tablet Take 1 tablet (2.5 mg total) by mouth daily.   Ascorbic Acid  (VITAMIN C ) 500 MG tablet Take 500 mg by mouth daily.   aspirin  EC 81 MG tablet Take 1 tablet (81 mg total) by mouth 2 (two) times daily. Swallow whole.   Calcium  Carb-Cholecalciferol  600-800 MG-UNIT TABS Take 2 tablets by mouth daily.   furosemide  (LASIX ) 20 MG tablet Take 1 tablet (20 mg total) by mouth daily as needed for edema (FOR LOWER LEG SWELLING). First 3 DAYS TAKE ONE TABLET DAILY THEN TAKE AS NEEDED   MELATONIN PO Take 1 tablet by  mouth as needed (sleep).   metoprolol  succinate (TOPROL  XL) 25 MG 24 hr tablet Take 1 tablet (25 mg total) by mouth daily.   Multiple Vitamins-Minerals (CENTRUM SILVER PO) Take 1 tablet by mouth daily.   "

## 2024-08-10 ENCOUNTER — Ambulatory Visit: Attending: Cardiology | Admitting: Cardiology

## 2024-08-10 ENCOUNTER — Encounter: Payer: Self-pay | Admitting: Cardiology

## 2024-08-10 VITALS — BP 120/68 | HR 66 | Ht 62.0 in | Wt 118.0 lb

## 2024-08-10 DIAGNOSIS — I10 Essential (primary) hypertension: Secondary | ICD-10-CM

## 2024-08-10 DIAGNOSIS — R0602 Shortness of breath: Secondary | ICD-10-CM | POA: Diagnosis not present

## 2024-08-10 DIAGNOSIS — I340 Nonrheumatic mitral (valve) insufficiency: Secondary | ICD-10-CM | POA: Diagnosis not present

## 2024-08-10 DIAGNOSIS — I493 Ventricular premature depolarization: Secondary | ICD-10-CM | POA: Diagnosis not present

## 2024-08-10 DIAGNOSIS — Z79899 Other long term (current) drug therapy: Secondary | ICD-10-CM | POA: Diagnosis not present

## 2024-08-10 NOTE — Patient Instructions (Signed)
 Medication Instructions:  Your physician recommends that you continue on your current medications as directed. Please refer to the Current Medication list given to you today.  *If you need a refill on your cardiac medications before your next appointment, please call your pharmacy*  Lab Work: NONE If you have labs (blood work) drawn today and your tests are completely normal, you will receive your results only by: MyChart Message (if you have MyChart) OR A paper copy in the mail If you have any lab test that is abnormal or we need to change your treatment, we will call you to review the results.  Testing/Procedures: NONE  Follow-Up: At [facility], you and your health needs are our priority.  As part of our continuing mission to provide you with exceptional heart care, our providers are all part of one team.  This team includes your primary Cardiologist (physician) and Advanced Practice Providers or APPs (Physician Assistants and Nurse Practitioners) who all work together to provide you with the care you need, when you need it.  Your next appointment:   6 month(s)  Provider:   Alean JONELLE Kobus, MD
